# Patient Record
Sex: Male | Born: 1980 | Race: Black or African American | Hispanic: No | Marital: Single | Smoking: Never smoker
Health system: Southern US, Community
[De-identification: ages and names within clinical notes are randomized; demographics above are authoritative.]

## PROBLEM LIST (undated history)

## (undated) DIAGNOSIS — B853 Phthiriasis: Secondary | ICD-10-CM

## (undated) DIAGNOSIS — H9190 Unspecified hearing loss, unspecified ear: Secondary | ICD-10-CM

## (undated) DIAGNOSIS — R21 Rash and other nonspecific skin eruption: Secondary | ICD-10-CM

## (undated) DIAGNOSIS — R350 Frequency of micturition: Secondary | ICD-10-CM

## (undated) DIAGNOSIS — K6289 Other specified diseases of anus and rectum: Secondary | ICD-10-CM

## (undated) DIAGNOSIS — J40 Bronchitis, not specified as acute or chronic: Secondary | ICD-10-CM

## (undated) DIAGNOSIS — K760 Fatty (change of) liver, not elsewhere classified: Secondary | ICD-10-CM

## (undated) DIAGNOSIS — H1030 Unspecified acute conjunctivitis, unspecified eye: Secondary | ICD-10-CM

## (undated) DIAGNOSIS — B20 Human immunodeficiency virus [HIV] disease: Secondary | ICD-10-CM

## (undated) DIAGNOSIS — F329 Major depressive disorder, single episode, unspecified: Secondary | ICD-10-CM

## (undated) DIAGNOSIS — J189 Pneumonia, unspecified organism: Secondary | ICD-10-CM

## (undated) DIAGNOSIS — Z9189 Other specified personal risk factors, not elsewhere classified: Secondary | ICD-10-CM

## (undated) DIAGNOSIS — K219 Gastro-esophageal reflux disease without esophagitis: Secondary | ICD-10-CM

## (undated) DIAGNOSIS — E669 Obesity, unspecified: Secondary | ICD-10-CM

## (undated) DIAGNOSIS — A749 Chlamydial infection, unspecified: Secondary | ICD-10-CM

## (undated) DIAGNOSIS — E785 Hyperlipidemia, unspecified: Secondary | ICD-10-CM

## (undated) DIAGNOSIS — B04 Monkeypox: Secondary | ICD-10-CM

## (undated) DIAGNOSIS — K529 Noninfective gastroenteritis and colitis, unspecified: Secondary | ICD-10-CM

## (undated) DIAGNOSIS — J329 Chronic sinusitis, unspecified: Secondary | ICD-10-CM

## (undated) DIAGNOSIS — A539 Syphilis, unspecified: Secondary | ICD-10-CM

## (undated) DIAGNOSIS — R05 Cough: Secondary | ICD-10-CM

## (undated) DIAGNOSIS — F411 Generalized anxiety disorder: Secondary | ICD-10-CM

## (undated) DIAGNOSIS — R911 Solitary pulmonary nodule: Secondary | ICD-10-CM

## (undated) DIAGNOSIS — K089 Disorder of teeth and supporting structures, unspecified: Secondary | ICD-10-CM

## (undated) DIAGNOSIS — K51311 Ulcerative (chronic) rectosigmoiditis with rectal bleeding: Secondary | ICD-10-CM

## (undated) DIAGNOSIS — N529 Male erectile dysfunction, unspecified: Secondary | ICD-10-CM

## (undated) HISTORY — PX: OTHER SURGICAL HISTORY: SHX169

## (undated) HISTORY — DX: Other specified personal risk factors, not elsewhere classified: Z91.89

## (undated) HISTORY — DX: Major depressive disorder, single episode, unspecified: F32.9

## (undated) HISTORY — DX: Rash and other nonspecific skin eruption: R21

## (undated) HISTORY — DX: Ulcerative (chronic) rectosigmoiditis with rectal bleeding: K51.311

## (undated) HISTORY — PX: FLEXIBLE SIGMOIDOSCOPY: SHX1649

## (undated) HISTORY — DX: Cough: R05

## (undated) HISTORY — DX: Obesity, unspecified: E66.9

## (undated) HISTORY — DX: Male erectile dysfunction, unspecified: N52.9

## (undated) HISTORY — DX: Fatty (change of) liver, not elsewhere classified: K76.0

## (undated) HISTORY — PX: COLONOSCOPY: SHX174

## (undated) HISTORY — DX: Monkeypox: B04

## (undated) HISTORY — DX: Bronchitis, not specified as acute or chronic: J40

## (undated) HISTORY — DX: Hyperlipidemia, unspecified: E78.5

## (undated) HISTORY — DX: Other specified diseases of anus and rectum: K62.89

## (undated) HISTORY — DX: Chlamydial infection, unspecified: A74.9

## (undated) HISTORY — DX: Frequency of micturition: R35.0

## (undated) HISTORY — DX: Generalized anxiety disorder: F41.1

## (undated) HISTORY — DX: Unspecified acute conjunctivitis, unspecified eye: H10.30

## (undated) HISTORY — DX: Gastro-esophageal reflux disease without esophagitis: K21.9

## (undated) HISTORY — DX: Chronic sinusitis, unspecified: J32.9

## (undated) HISTORY — DX: Noninfective gastroenteritis and colitis, unspecified: K52.9

## (undated) HISTORY — PX: THROAT SURGERY: SHX803

## (undated) HISTORY — PX: UMBILICAL HERNIA REPAIR: SHX196

## (undated) HISTORY — DX: Unspecified hearing loss, unspecified ear: H91.90

## (undated) HISTORY — DX: Syphilis, unspecified: A53.9

## (undated) HISTORY — DX: Disorder of teeth and supporting structures, unspecified: K08.9

## (undated) HISTORY — DX: Solitary pulmonary nodule: R91.1

## (undated) HISTORY — DX: Phthiriasis: B85.3

## (undated) HISTORY — PX: ESOPHAGOGASTRODUODENOSCOPY: SHX1529

---

## 2003-03-02 ENCOUNTER — Encounter: Payer: Self-pay | Admitting: Internal Medicine

## 2003-03-02 ENCOUNTER — Encounter: Admission: RE | Admit: 2003-03-02 | Discharge: 2003-03-02 | Payer: Self-pay | Admitting: Internal Medicine

## 2003-03-02 ENCOUNTER — Encounter (INDEPENDENT_AMBULATORY_CARE_PROVIDER_SITE_OTHER): Payer: Self-pay | Admitting: *Deleted

## 2003-03-02 ENCOUNTER — Ambulatory Visit (HOSPITAL_COMMUNITY): Admission: RE | Admit: 2003-03-02 | Discharge: 2003-03-02 | Payer: Self-pay | Admitting: Internal Medicine

## 2003-03-17 ENCOUNTER — Encounter: Admission: RE | Admit: 2003-03-17 | Discharge: 2003-03-17 | Payer: Self-pay | Admitting: Internal Medicine

## 2003-03-18 ENCOUNTER — Inpatient Hospital Stay (HOSPITAL_COMMUNITY): Admission: EM | Admit: 2003-03-18 | Discharge: 2003-03-20 | Payer: Self-pay | Admitting: Psychiatry

## 2003-03-20 ENCOUNTER — Inpatient Hospital Stay (HOSPITAL_COMMUNITY): Admission: EM | Admit: 2003-03-20 | Discharge: 2003-03-23 | Payer: Self-pay | Admitting: Emergency Medicine

## 2003-04-28 ENCOUNTER — Encounter: Admission: RE | Admit: 2003-04-28 | Discharge: 2003-04-28 | Payer: Self-pay | Admitting: Internal Medicine

## 2003-05-26 ENCOUNTER — Encounter: Admission: RE | Admit: 2003-05-26 | Discharge: 2003-05-26 | Payer: Self-pay | Admitting: Internal Medicine

## 2003-06-11 ENCOUNTER — Emergency Department (HOSPITAL_COMMUNITY): Admission: EM | Admit: 2003-06-11 | Discharge: 2003-06-11 | Payer: Self-pay | Admitting: Emergency Medicine

## 2003-06-12 ENCOUNTER — Inpatient Hospital Stay (HOSPITAL_COMMUNITY): Admission: RE | Admit: 2003-06-12 | Discharge: 2003-06-16 | Payer: Self-pay | Admitting: Psychiatry

## 2003-07-30 ENCOUNTER — Ambulatory Visit (HOSPITAL_COMMUNITY): Admission: RE | Admit: 2003-07-30 | Discharge: 2003-07-30 | Payer: Self-pay | Admitting: Internal Medicine

## 2003-07-30 ENCOUNTER — Encounter: Admission: RE | Admit: 2003-07-30 | Discharge: 2003-07-30 | Payer: Self-pay | Admitting: Internal Medicine

## 2003-09-14 ENCOUNTER — Encounter: Admission: RE | Admit: 2003-09-14 | Discharge: 2003-09-14 | Payer: Self-pay | Admitting: Internal Medicine

## 2003-11-03 ENCOUNTER — Ambulatory Visit: Payer: Self-pay | Admitting: Internal Medicine

## 2004-03-16 ENCOUNTER — Ambulatory Visit: Payer: Self-pay | Admitting: Internal Medicine

## 2004-05-19 ENCOUNTER — Emergency Department (HOSPITAL_COMMUNITY): Admission: EM | Admit: 2004-05-19 | Discharge: 2004-05-19 | Payer: Self-pay | Admitting: Family Medicine

## 2004-06-21 ENCOUNTER — Ambulatory Visit: Payer: Self-pay | Admitting: Internal Medicine

## 2004-06-21 ENCOUNTER — Ambulatory Visit (HOSPITAL_COMMUNITY): Admission: RE | Admit: 2004-06-21 | Discharge: 2004-06-21 | Payer: Self-pay | Admitting: Internal Medicine

## 2004-09-01 ENCOUNTER — Ambulatory Visit: Payer: Self-pay | Admitting: Internal Medicine

## 2004-09-28 ENCOUNTER — Ambulatory Visit: Payer: Self-pay | Admitting: Internal Medicine

## 2005-04-25 ENCOUNTER — Ambulatory Visit: Payer: Self-pay | Admitting: Internal Medicine

## 2005-04-25 ENCOUNTER — Encounter (INDEPENDENT_AMBULATORY_CARE_PROVIDER_SITE_OTHER): Payer: Self-pay | Admitting: *Deleted

## 2006-01-09 ENCOUNTER — Encounter: Admission: RE | Admit: 2006-01-09 | Discharge: 2006-01-09 | Payer: Self-pay | Admitting: Internal Medicine

## 2006-01-09 ENCOUNTER — Ambulatory Visit: Payer: Self-pay | Admitting: Internal Medicine

## 2006-01-09 ENCOUNTER — Encounter (INDEPENDENT_AMBULATORY_CARE_PROVIDER_SITE_OTHER): Payer: Self-pay | Admitting: *Deleted

## 2006-01-09 LAB — CONVERTED CEMR LAB
CD4 Count: 640 microliters
HIV 1 RNA Quant: 78200 copies/mL

## 2006-01-11 ENCOUNTER — Ambulatory Visit: Payer: Self-pay | Admitting: Infectious Diseases

## 2006-01-11 ENCOUNTER — Inpatient Hospital Stay (HOSPITAL_COMMUNITY): Admission: EM | Admit: 2006-01-11 | Discharge: 2006-01-15 | Payer: Self-pay | Admitting: Emergency Medicine

## 2006-01-11 ENCOUNTER — Encounter (INDEPENDENT_AMBULATORY_CARE_PROVIDER_SITE_OTHER): Payer: Self-pay | Admitting: *Deleted

## 2006-01-23 ENCOUNTER — Ambulatory Visit: Payer: Self-pay | Admitting: Infectious Diseases

## 2006-01-23 LAB — CONVERTED CEMR LAB
ALT: 20 units/L (ref 0–53)
BUN: 8 mg/dL (ref 6–23)
CO2: 30 meq/L (ref 19–32)
Creatinine, Ser: 0.86 mg/dL (ref 0.40–1.50)
HCT: 45.8 % (ref 41.0–49.0)
MCV: 86.7 fL (ref 78.8–100.0)
Platelets: 329 10*3/uL (ref 152–374)
RDW: 13.3 % (ref 11.5–15.3)
Total Bilirubin: 0.7 mg/dL (ref 0.3–1.2)
WBC: 5.1 10*3/uL (ref 3.7–10.0)

## 2006-01-25 ENCOUNTER — Ambulatory Visit: Payer: Self-pay | Admitting: Infectious Diseases

## 2006-01-25 LAB — CONVERTED CEMR LAB
Ketones, ur: NEGATIVE mg/dL
Leukocytes, UA: NEGATIVE
Nitrite: NEGATIVE
RBC / HPF: NONE SEEN (ref <3)
Specific Gravity, Urine: 1.023 (ref 1.005–1.03)
pH: 6.5 (ref 5.0–8.0)

## 2006-03-02 DIAGNOSIS — F411 Generalized anxiety disorder: Secondary | ICD-10-CM

## 2006-03-02 DIAGNOSIS — J309 Allergic rhinitis, unspecified: Secondary | ICD-10-CM | POA: Insufficient documentation

## 2006-03-02 DIAGNOSIS — F329 Major depressive disorder, single episode, unspecified: Secondary | ICD-10-CM

## 2006-03-02 DIAGNOSIS — F3289 Other specified depressive episodes: Secondary | ICD-10-CM

## 2006-03-02 DIAGNOSIS — B20 Human immunodeficiency virus [HIV] disease: Secondary | ICD-10-CM

## 2006-03-02 HISTORY — DX: Other specified depressive episodes: F32.89

## 2006-03-02 HISTORY — DX: Major depressive disorder, single episode, unspecified: F32.9

## 2006-03-02 HISTORY — DX: Generalized anxiety disorder: F41.1

## 2006-03-26 ENCOUNTER — Encounter (INDEPENDENT_AMBULATORY_CARE_PROVIDER_SITE_OTHER): Payer: Self-pay | Admitting: *Deleted

## 2006-03-26 ENCOUNTER — Encounter: Admission: RE | Admit: 2006-03-26 | Discharge: 2006-03-26 | Payer: Self-pay | Admitting: Internal Medicine

## 2006-03-26 ENCOUNTER — Ambulatory Visit: Payer: Self-pay | Admitting: Internal Medicine

## 2006-03-26 DIAGNOSIS — H9190 Unspecified hearing loss, unspecified ear: Secondary | ICD-10-CM

## 2006-03-26 DIAGNOSIS — K6289 Other specified diseases of anus and rectum: Secondary | ICD-10-CM

## 2006-03-26 HISTORY — DX: Other specified diseases of anus and rectum: K62.89

## 2006-03-26 HISTORY — DX: Unspecified hearing loss, unspecified ear: H91.90

## 2006-03-26 LAB — CONVERTED CEMR LAB
ALT: 16 units/L (ref 0–53)
AST: 22 units/L (ref 0–37)
Albumin: 4.1 g/dL (ref 3.5–5.2)
Alkaline Phosphatase: 99 units/L (ref 39–117)
CD4 Count: 730 microliters
Calcium: 9.3 mg/dL (ref 8.4–10.5)
Chloride: 105 meq/L (ref 96–112)
HIV 1 RNA Quant: 40800 copies/mL
MCHC: 33.9 g/dL (ref 30.0–36.0)
Platelets: 250 10*3/uL (ref 150–400)
Potassium: 4 meq/L (ref 3.5–5.3)
RDW: 13.3 % (ref 11.5–14.0)
Sodium: 141 meq/L (ref 135–145)

## 2006-04-23 ENCOUNTER — Encounter (INDEPENDENT_AMBULATORY_CARE_PROVIDER_SITE_OTHER): Payer: Self-pay | Admitting: *Deleted

## 2006-04-23 LAB — CONVERTED CEMR LAB

## 2006-05-06 ENCOUNTER — Encounter (INDEPENDENT_AMBULATORY_CARE_PROVIDER_SITE_OTHER): Payer: Self-pay | Admitting: *Deleted

## 2006-06-20 DIAGNOSIS — Z9189 Other specified personal risk factors, not elsewhere classified: Secondary | ICD-10-CM

## 2006-06-20 DIAGNOSIS — B853 Phthiriasis: Secondary | ICD-10-CM

## 2006-06-20 HISTORY — DX: Phthiriasis: B85.3

## 2006-06-20 HISTORY — DX: Other specified personal risk factors, not elsewhere classified: Z91.89

## 2006-06-26 ENCOUNTER — Ambulatory Visit: Payer: Self-pay | Admitting: Internal Medicine

## 2006-06-26 ENCOUNTER — Encounter: Admission: RE | Admit: 2006-06-26 | Discharge: 2006-06-26 | Payer: Self-pay | Admitting: Internal Medicine

## 2006-06-26 LAB — CONVERTED CEMR LAB
CD4 Count: 750 microliters
HIV 1 RNA Quant: 38600 copies/mL — ABNORMAL HIGH (ref <50)
HIV-1 RNA Quant, Log: 4.59 — ABNORMAL HIGH (ref <1.70)

## 2006-08-06 ENCOUNTER — Telehealth: Payer: Self-pay

## 2006-09-25 ENCOUNTER — Telehealth: Payer: Self-pay | Admitting: Internal Medicine

## 2006-12-25 ENCOUNTER — Ambulatory Visit: Payer: Self-pay | Admitting: Internal Medicine

## 2006-12-25 ENCOUNTER — Encounter: Admission: RE | Admit: 2006-12-25 | Discharge: 2006-12-25 | Payer: Self-pay | Admitting: Internal Medicine

## 2006-12-25 LAB — CONVERTED CEMR LAB: HIV-1 RNA Quant, Log: 5.03 — ABNORMAL HIGH (ref <1.70)

## 2007-02-01 ENCOUNTER — Telehealth: Payer: Self-pay | Admitting: Internal Medicine

## 2007-02-26 ENCOUNTER — Encounter: Payer: Self-pay | Admitting: Internal Medicine

## 2007-05-15 ENCOUNTER — Encounter: Admission: RE | Admit: 2007-05-15 | Discharge: 2007-05-15 | Payer: Self-pay | Admitting: Internal Medicine

## 2007-05-15 ENCOUNTER — Ambulatory Visit: Payer: Self-pay | Admitting: Internal Medicine

## 2007-05-15 LAB — CONVERTED CEMR LAB
ALT: 20 units/L (ref 0–53)
AST: 35 units/L (ref 0–37)
Alkaline Phosphatase: 107 units/L (ref 39–117)
Basophils Absolute: 0.1 10*3/uL (ref 0.0–0.1)
CO2: 24 meq/L (ref 19–32)
Creatinine, Ser: 0.76 mg/dL (ref 0.40–1.50)
Eosinophils Absolute: 0.1 10*3/uL (ref 0.0–0.7)
Eosinophils Relative: 3 % (ref 0–5)
HCT: 44.8 % (ref 39.0–52.0)
Lymphs Abs: 2.9 10*3/uL (ref 0.7–4.0)
MCV: 88.4 fL (ref 78.0–100.0)
Neutrophils Relative %: 18 % — ABNORMAL LOW (ref 43–77)
Platelets: 216 10*3/uL (ref 150–400)
RDW: 12.9 % (ref 11.5–15.5)
Sodium: 137 meq/L (ref 135–145)
Total Bilirubin: 0.4 mg/dL (ref 0.3–1.2)
Total Protein: 8.6 g/dL — ABNORMAL HIGH (ref 6.0–8.3)
WBC: 4.7 10*3/uL (ref 4.0–10.5)

## 2007-05-27 ENCOUNTER — Ambulatory Visit: Payer: Self-pay | Admitting: Internal Medicine

## 2007-06-17 ENCOUNTER — Emergency Department (HOSPITAL_COMMUNITY): Admission: EM | Admit: 2007-06-17 | Discharge: 2007-06-17 | Payer: Self-pay | Admitting: Emergency Medicine

## 2007-10-24 ENCOUNTER — Ambulatory Visit: Payer: Self-pay | Admitting: Internal Medicine

## 2007-10-24 LAB — CONVERTED CEMR LAB
HIV 1 RNA Quant: 211000 copies/mL — ABNORMAL HIGH (ref <50)
HIV-1 RNA Quant, Log: 5.32 — ABNORMAL HIGH (ref <1.70)

## 2007-11-05 ENCOUNTER — Ambulatory Visit: Payer: Self-pay | Admitting: Internal Medicine

## 2008-05-19 ENCOUNTER — Telehealth: Payer: Self-pay | Admitting: Internal Medicine

## 2008-06-24 ENCOUNTER — Emergency Department (HOSPITAL_COMMUNITY): Admission: EM | Admit: 2008-06-24 | Discharge: 2008-06-24 | Payer: Self-pay | Admitting: Emergency Medicine

## 2008-06-25 ENCOUNTER — Telehealth (INDEPENDENT_AMBULATORY_CARE_PROVIDER_SITE_OTHER): Payer: Self-pay | Admitting: *Deleted

## 2008-06-26 ENCOUNTER — Ambulatory Visit: Payer: Self-pay | Admitting: Internal Medicine

## 2008-06-30 ENCOUNTER — Telehealth: Payer: Self-pay | Admitting: Internal Medicine

## 2008-07-01 ENCOUNTER — Encounter: Payer: Self-pay | Admitting: Internal Medicine

## 2008-07-01 ENCOUNTER — Telehealth (INDEPENDENT_AMBULATORY_CARE_PROVIDER_SITE_OTHER): Payer: Self-pay | Admitting: Licensed Clinical Social Worker

## 2008-07-03 ENCOUNTER — Telehealth (INDEPENDENT_AMBULATORY_CARE_PROVIDER_SITE_OTHER): Payer: Self-pay | Admitting: *Deleted

## 2008-07-07 ENCOUNTER — Ambulatory Visit: Payer: Self-pay | Admitting: Internal Medicine

## 2008-07-07 LAB — CONVERTED CEMR LAB
ALT: 28 units/L (ref 0–53)
AST: 49 units/L — ABNORMAL HIGH (ref 0–37)
Basophils Absolute: 0.1 10*3/uL (ref 0.0–0.1)
Basophils Relative: 2 % — ABNORMAL HIGH (ref 0–1)
CO2: 25 meq/L (ref 19–32)
Calcium: 8.7 mg/dL (ref 8.4–10.5)
Chloride: 104 meq/L (ref 96–112)
Creatinine, Ser: 0.84 mg/dL (ref 0.40–1.50)
Eosinophils Absolute: 0.1 10*3/uL (ref 0.0–0.7)
GFR calc Af Amer: >60 mL/min (ref >60)
MCHC: 34.4 g/dL (ref 30.0–36.0)
MCV: 85.5 fL (ref 78.0–100.0)
Monocytes Absolute: 0.7 10*3/uL (ref 0.1–1.0)
Monocytes Relative: 14 % — ABNORMAL HIGH (ref 3–12)
Neutrophils Relative %: 23 % — ABNORMAL LOW (ref 43–77)
Potassium: 3.6 meq/L (ref 3.5–5.3)
RBC: 4.69 M/uL (ref 4.22–5.81)
RDW: 13.1 % (ref 11.5–15.5)
Sodium: 139 meq/L (ref 135–145)
Total Protein: 8.1 g/dL (ref 6.0–8.3)

## 2008-07-14 ENCOUNTER — Ambulatory Visit: Payer: Self-pay | Admitting: Internal Medicine

## 2008-07-17 ENCOUNTER — Telehealth: Payer: Self-pay | Admitting: Internal Medicine

## 2008-10-01 ENCOUNTER — Telehealth (INDEPENDENT_AMBULATORY_CARE_PROVIDER_SITE_OTHER): Payer: Self-pay | Admitting: *Deleted

## 2008-10-07 ENCOUNTER — Ambulatory Visit: Payer: Self-pay | Admitting: Internal Medicine

## 2008-10-07 ENCOUNTER — Telehealth: Payer: Self-pay | Admitting: Internal Medicine

## 2008-10-07 LAB — CONVERTED CEMR LAB: Helicobacter Pylori Antibody-IgG: 0.5

## 2008-10-08 ENCOUNTER — Encounter: Payer: Self-pay | Admitting: Internal Medicine

## 2008-10-08 ENCOUNTER — Ambulatory Visit: Payer: Self-pay | Admitting: Gastroenterology

## 2008-10-08 LAB — CONVERTED CEMR LAB
Basophils Relative: 1.1 % (ref 0.0–3.0)
CO2: 31 meq/L (ref 19–32)
Chloride: 102 meq/L (ref 96–112)
Eosinophils Absolute: 0.1 10*3/uL (ref 0.0–0.7)
Glucose, Bld: 88 mg/dL (ref 70–99)
Hemoglobin: 15 g/dL (ref 13.0–17.0)
Lymphs Abs: 2.6 10*3/uL (ref 0.7–4.0)
MCHC: 33 g/dL (ref 30.0–36.0)
MCV: 91.8 fL (ref 78.0–100.0)
Monocytes Absolute: 0.8 10*3/uL (ref 0.1–1.0)
Neutro Abs: 1.6 10*3/uL (ref 1.4–7.7)
RBC: 4.93 M/uL (ref 4.22–5.81)
Sodium: 139 meq/L (ref 135–145)

## 2008-10-10 ENCOUNTER — Encounter: Payer: Self-pay | Admitting: Physician Assistant

## 2008-10-10 ENCOUNTER — Encounter: Payer: Self-pay | Admitting: Internal Medicine

## 2008-10-13 ENCOUNTER — Telehealth (INDEPENDENT_AMBULATORY_CARE_PROVIDER_SITE_OTHER): Payer: Self-pay | Admitting: *Deleted

## 2008-10-21 ENCOUNTER — Telehealth (INDEPENDENT_AMBULATORY_CARE_PROVIDER_SITE_OTHER): Payer: Self-pay | Admitting: *Deleted

## 2008-10-21 ENCOUNTER — Encounter: Payer: Self-pay | Admitting: Internal Medicine

## 2008-10-21 ENCOUNTER — Ambulatory Visit: Payer: Self-pay | Admitting: Infectious Diseases

## 2008-10-21 ENCOUNTER — Telehealth: Payer: Self-pay | Admitting: Physician Assistant

## 2008-10-21 ENCOUNTER — Ambulatory Visit (HOSPITAL_COMMUNITY): Admission: RE | Admit: 2008-10-21 | Discharge: 2008-10-21 | Payer: Self-pay | Admitting: Infectious Diseases

## 2008-10-21 LAB — CONVERTED CEMR LAB
Basophils Absolute: 0 10*3/uL (ref 0.0–0.1)
Eosinophils Absolute: 0.1 10*3/uL (ref 0.0–0.7)
Eosinophils Relative: 3 % (ref 0–5)
HCT: 44 % (ref 39.0–52.0)
Lymphs Abs: 3.1 10*3/uL (ref 0.7–4.0)
MCHC: 34.2 g/dL (ref 30.0–36.0)
MCV: 90.1 fL (ref >=78.0)
Platelets: 197 10*3/uL (ref 150–400)
RDW: 12.9 % (ref 11.5–15.5)

## 2008-10-23 ENCOUNTER — Telehealth: Payer: Self-pay | Admitting: Infectious Diseases

## 2008-10-26 ENCOUNTER — Telehealth (INDEPENDENT_AMBULATORY_CARE_PROVIDER_SITE_OTHER): Payer: Self-pay | Admitting: Licensed Clinical Social Worker

## 2008-10-27 ENCOUNTER — Telehealth (INDEPENDENT_AMBULATORY_CARE_PROVIDER_SITE_OTHER): Payer: Self-pay | Admitting: *Deleted

## 2008-10-28 ENCOUNTER — Telehealth (INDEPENDENT_AMBULATORY_CARE_PROVIDER_SITE_OTHER): Payer: Self-pay | Admitting: *Deleted

## 2008-10-30 ENCOUNTER — Telehealth (INDEPENDENT_AMBULATORY_CARE_PROVIDER_SITE_OTHER): Payer: Self-pay | Admitting: *Deleted

## 2008-11-06 ENCOUNTER — Emergency Department (HOSPITAL_COMMUNITY): Admission: EM | Admit: 2008-11-06 | Discharge: 2008-11-06 | Payer: Self-pay | Admitting: Emergency Medicine

## 2008-11-06 ENCOUNTER — Telehealth (INDEPENDENT_AMBULATORY_CARE_PROVIDER_SITE_OTHER): Payer: Self-pay | Admitting: *Deleted

## 2008-11-09 ENCOUNTER — Telehealth (INDEPENDENT_AMBULATORY_CARE_PROVIDER_SITE_OTHER): Payer: Self-pay | Admitting: *Deleted

## 2008-11-11 ENCOUNTER — Emergency Department (HOSPITAL_COMMUNITY): Admission: EM | Admit: 2008-11-11 | Discharge: 2008-11-12 | Payer: Self-pay | Admitting: Emergency Medicine

## 2008-11-12 ENCOUNTER — Telehealth (INDEPENDENT_AMBULATORY_CARE_PROVIDER_SITE_OTHER): Payer: Self-pay | Admitting: *Deleted

## 2008-11-13 ENCOUNTER — Ambulatory Visit: Payer: Self-pay | Admitting: Internal Medicine

## 2008-11-13 LAB — CONVERTED CEMR LAB
Glucose, Urine, Semiquant: NEGATIVE
Nitrite: NEGATIVE
Protein, U semiquant: 30
Specific Gravity, Urine: >=1.03

## 2008-11-17 ENCOUNTER — Ambulatory Visit: Payer: Self-pay | Admitting: Internal Medicine

## 2008-11-17 DIAGNOSIS — R21 Rash and other nonspecific skin eruption: Secondary | ICD-10-CM

## 2008-11-17 DIAGNOSIS — N529 Male erectile dysfunction, unspecified: Secondary | ICD-10-CM

## 2008-11-17 HISTORY — DX: Male erectile dysfunction, unspecified: N52.9

## 2008-11-17 HISTORY — DX: Rash and other nonspecific skin eruption: R21

## 2008-11-17 LAB — CONVERTED CEMR LAB: Testosterone: 360.15 ng/dL (ref 350–890)

## 2008-11-30 ENCOUNTER — Telehealth: Payer: Self-pay

## 2008-12-18 ENCOUNTER — Telehealth (INDEPENDENT_AMBULATORY_CARE_PROVIDER_SITE_OTHER): Payer: Self-pay | Admitting: *Deleted

## 2008-12-23 ENCOUNTER — Ambulatory Visit: Payer: Self-pay | Admitting: Internal Medicine

## 2008-12-23 DIAGNOSIS — R059 Cough, unspecified: Secondary | ICD-10-CM

## 2008-12-23 DIAGNOSIS — R05 Cough: Secondary | ICD-10-CM

## 2008-12-23 DIAGNOSIS — R052 Subacute cough: Secondary | ICD-10-CM | POA: Insufficient documentation

## 2008-12-23 HISTORY — DX: Cough, unspecified: R05.9

## 2008-12-25 ENCOUNTER — Telehealth (INDEPENDENT_AMBULATORY_CARE_PROVIDER_SITE_OTHER): Payer: Self-pay | Admitting: *Deleted

## 2009-02-16 ENCOUNTER — Telehealth: Payer: Self-pay

## 2009-02-18 ENCOUNTER — Ambulatory Visit: Payer: Self-pay | Admitting: Infectious Disease

## 2009-02-18 DIAGNOSIS — J329 Chronic sinusitis, unspecified: Secondary | ICD-10-CM | POA: Insufficient documentation

## 2009-02-18 HISTORY — DX: Chronic sinusitis, unspecified: J32.9

## 2009-03-16 ENCOUNTER — Telehealth (INDEPENDENT_AMBULATORY_CARE_PROVIDER_SITE_OTHER): Payer: Self-pay | Admitting: *Deleted

## 2009-03-23 ENCOUNTER — Ambulatory Visit: Payer: Self-pay | Admitting: Internal Medicine

## 2009-03-23 LAB — CONVERTED CEMR LAB
Albumin: 4.2 g/dL (ref 3.5–5.2)
BUN: 14 mg/dL (ref 6–23)
CO2: 27 meq/L (ref 19–32)
Calcium: 9.4 mg/dL (ref 8.4–10.5)
Chloride: 100 meq/L (ref 96–112)
Eosinophils Relative: 3 % (ref 0–5)
Glucose, Bld: 78 mg/dL (ref 70–99)
HCT: 43.7 % (ref 39.0–52.0)
HDL: 29 mg/dL — ABNORMAL LOW (ref >39)
HIV-1 RNA Quant, Log: 5.48 — ABNORMAL HIGH (ref <1.68)
Hemoglobin: 15 g/dL (ref 13.0–17.0)
Lymphocytes Relative: 58 % — ABNORMAL HIGH (ref 12–46)
Lymphs Abs: 2.6 10*3/uL (ref 0.7–4.0)
Monocytes Absolute: 0.6 10*3/uL (ref 0.1–1.0)
Monocytes Relative: 13 % — ABNORMAL HIGH (ref 3–12)
Potassium: 3.9 meq/L (ref 3.5–5.3)
RDW: 12.6 % (ref 11.5–15.5)
Triglycerides: 80 mg/dL (ref <150)
WBC: 4.6 10*3/uL (ref 4.0–10.5)

## 2009-04-06 ENCOUNTER — Ambulatory Visit: Payer: Self-pay | Admitting: Internal Medicine

## 2009-04-06 DIAGNOSIS — R350 Frequency of micturition: Secondary | ICD-10-CM

## 2009-04-06 HISTORY — DX: Frequency of micturition: R35.0

## 2009-04-06 LAB — CONVERTED CEMR LAB
Chlamydia, Swab/Urine, PCR: NEGATIVE
GC Probe Amp, Urine: NEGATIVE

## 2009-04-26 ENCOUNTER — Telehealth (INDEPENDENT_AMBULATORY_CARE_PROVIDER_SITE_OTHER): Payer: Self-pay | Admitting: *Deleted

## 2009-04-29 ENCOUNTER — Telehealth: Payer: Self-pay | Admitting: Internal Medicine

## 2009-05-24 ENCOUNTER — Telehealth (INDEPENDENT_AMBULATORY_CARE_PROVIDER_SITE_OTHER): Payer: Self-pay | Admitting: *Deleted

## 2009-07-05 ENCOUNTER — Ambulatory Visit: Payer: Self-pay | Admitting: Internal Medicine

## 2009-07-05 LAB — CONVERTED CEMR LAB
HIV 1 RNA Quant: 1410000 copies/mL — ABNORMAL HIGH (ref <48)
HIV-1 RNA Quant, Log: 6.15 — ABNORMAL HIGH (ref <1.68)

## 2009-07-06 ENCOUNTER — Encounter: Payer: Self-pay | Admitting: Internal Medicine

## 2009-07-20 ENCOUNTER — Ambulatory Visit: Payer: Self-pay | Admitting: Internal Medicine

## 2009-07-20 DIAGNOSIS — K219 Gastro-esophageal reflux disease without esophagitis: Secondary | ICD-10-CM

## 2009-07-20 HISTORY — DX: Gastro-esophageal reflux disease without esophagitis: K21.9

## 2009-07-27 ENCOUNTER — Telehealth (INDEPENDENT_AMBULATORY_CARE_PROVIDER_SITE_OTHER): Payer: Self-pay | Admitting: *Deleted

## 2009-07-27 ENCOUNTER — Ambulatory Visit: Payer: Self-pay | Admitting: Internal Medicine

## 2009-07-27 DIAGNOSIS — R197 Diarrhea, unspecified: Secondary | ICD-10-CM

## 2009-07-28 ENCOUNTER — Encounter (INDEPENDENT_AMBULATORY_CARE_PROVIDER_SITE_OTHER): Payer: Self-pay | Admitting: *Deleted

## 2009-07-28 ENCOUNTER — Telehealth: Payer: Self-pay | Admitting: Internal Medicine

## 2009-07-29 ENCOUNTER — Telehealth: Payer: Self-pay | Admitting: Internal Medicine

## 2009-08-03 ENCOUNTER — Telehealth (INDEPENDENT_AMBULATORY_CARE_PROVIDER_SITE_OTHER): Payer: Self-pay | Admitting: *Deleted

## 2009-08-05 ENCOUNTER — Telehealth (INDEPENDENT_AMBULATORY_CARE_PROVIDER_SITE_OTHER): Payer: Self-pay | Admitting: *Deleted

## 2009-09-02 ENCOUNTER — Telehealth: Payer: Self-pay | Admitting: Internal Medicine

## 2009-09-22 ENCOUNTER — Telehealth (INDEPENDENT_AMBULATORY_CARE_PROVIDER_SITE_OTHER): Payer: Self-pay | Admitting: *Deleted

## 2009-09-28 ENCOUNTER — Telehealth (INDEPENDENT_AMBULATORY_CARE_PROVIDER_SITE_OTHER): Payer: Self-pay | Admitting: *Deleted

## 2009-10-15 ENCOUNTER — Telehealth (INDEPENDENT_AMBULATORY_CARE_PROVIDER_SITE_OTHER): Payer: Self-pay | Admitting: *Deleted

## 2009-10-28 ENCOUNTER — Telehealth (INDEPENDENT_AMBULATORY_CARE_PROVIDER_SITE_OTHER): Payer: Self-pay | Admitting: *Deleted

## 2009-11-02 ENCOUNTER — Ambulatory Visit: Payer: Self-pay | Admitting: Internal Medicine

## 2009-11-04 ENCOUNTER — Telehealth (INDEPENDENT_AMBULATORY_CARE_PROVIDER_SITE_OTHER): Payer: Self-pay | Admitting: *Deleted

## 2009-11-15 ENCOUNTER — Ambulatory Visit: Payer: Self-pay | Admitting: Internal Medicine

## 2009-11-15 LAB — CONVERTED CEMR LAB
ALT: 21 units/L (ref 0–53)
AST: 29 units/L (ref 0–37)
Alkaline Phosphatase: 101 units/L (ref 39–117)
Basophils Absolute: 0 10*3/uL (ref 0.0–0.1)
Basophils Relative: 0 % (ref 0–1)
CO2: 28 meq/L (ref 19–32)
Eosinophils Relative: 2 % (ref 0–5)
HCT: 45.6 % (ref 39.0–52.0)
HIV 1 RNA Quant: 584000 copies/mL — ABNORMAL HIGH (ref <20)
Lymphocytes Relative: 56 % — ABNORMAL HIGH (ref 12–46)
MCHC: 34.4 g/dL (ref 30.0–36.0)
Neutro Abs: 1.2 10*3/uL — ABNORMAL LOW (ref 1.7–7.7)
Platelets: 211 10*3/uL (ref 150–400)
RDW: 13.5 % (ref 11.5–15.5)
Sodium: 141 meq/L (ref 135–145)
Total Bilirubin: 0.5 mg/dL (ref 0.3–1.2)
Total Protein: 8.3 g/dL (ref 6.0–8.3)

## 2009-11-22 ENCOUNTER — Telehealth (INDEPENDENT_AMBULATORY_CARE_PROVIDER_SITE_OTHER): Payer: Self-pay | Admitting: *Deleted

## 2009-11-26 ENCOUNTER — Telehealth (INDEPENDENT_AMBULATORY_CARE_PROVIDER_SITE_OTHER): Payer: Self-pay | Admitting: *Deleted

## 2009-11-26 ENCOUNTER — Encounter (INDEPENDENT_AMBULATORY_CARE_PROVIDER_SITE_OTHER): Payer: Self-pay | Admitting: *Deleted

## 2009-12-09 ENCOUNTER — Encounter: Payer: Self-pay | Admitting: Internal Medicine

## 2009-12-13 ENCOUNTER — Telehealth (INDEPENDENT_AMBULATORY_CARE_PROVIDER_SITE_OTHER): Payer: Self-pay | Admitting: *Deleted

## 2009-12-14 ENCOUNTER — Encounter: Payer: Self-pay | Admitting: Internal Medicine

## 2009-12-17 ENCOUNTER — Encounter (INDEPENDENT_AMBULATORY_CARE_PROVIDER_SITE_OTHER): Payer: Self-pay | Admitting: *Deleted

## 2009-12-30 ENCOUNTER — Ambulatory Visit: Payer: Self-pay | Admitting: Internal Medicine

## 2009-12-30 LAB — CONVERTED CEMR LAB
Albumin: 4.5 g/dL (ref 3.5–5.2)
BUN: 11 mg/dL (ref 6–23)
CO2: 26 meq/L (ref 19–32)
Calcium: 9 mg/dL (ref 8.4–10.5)
Chloride: 99 meq/L (ref 96–112)
Creatinine, Ser: 1.06 mg/dL (ref 0.40–1.50)
Glucose, Bld: 103 mg/dL — ABNORMAL HIGH (ref 70–99)
HIV 1 RNA Quant: 226 copies/mL — ABNORMAL HIGH (ref <20)
HIV-1 RNA Quant, Log: 2.35 — ABNORMAL HIGH (ref <1.30)
Potassium: 4 meq/L (ref 3.5–5.3)

## 2010-01-05 ENCOUNTER — Encounter: Payer: Self-pay | Admitting: Internal Medicine

## 2010-01-11 ENCOUNTER — Telehealth (INDEPENDENT_AMBULATORY_CARE_PROVIDER_SITE_OTHER): Payer: Self-pay | Admitting: *Deleted

## 2010-01-12 ENCOUNTER — Telehealth (INDEPENDENT_AMBULATORY_CARE_PROVIDER_SITE_OTHER): Payer: Self-pay | Admitting: *Deleted

## 2010-01-25 ENCOUNTER — Encounter (INDEPENDENT_AMBULATORY_CARE_PROVIDER_SITE_OTHER): Payer: Self-pay | Admitting: *Deleted

## 2010-02-08 ENCOUNTER — Ambulatory Visit: Payer: Self-pay | Admitting: Internal Medicine

## 2010-02-09 ENCOUNTER — Encounter: Payer: Self-pay | Admitting: Internal Medicine

## 2010-03-22 ENCOUNTER — Encounter: Payer: Self-pay | Admitting: Internal Medicine

## 2010-03-28 ENCOUNTER — Telehealth: Payer: Self-pay | Admitting: Internal Medicine

## 2010-03-31 NOTE — Assessment & Plan Note (Signed)
Summary: 6wk f/u [mkj]   Referring Provider:  Michel Bickers Primary Provider:  Michel Bickers MD  CC:  follow-up visit and diarrhea down to once a month.  Still has stomach pain occas. .  History of Present Illness: Matthew Fitzpatrick is in for his routine visit.  He has all of his medications and appears to be taking them correctly.  He denies missing any doses.  He is struggling with medical bills.  Preventive Screening-Counseling & Management  Alcohol-Tobacco     Alcohol drinks/day: 0     Smoking Status: never  Caffeine-Diet-Exercise     Caffeine use/day: sodas     Does Patient Exercise: no      Type of exercise: walking and running     Exercise (avg: min/session): <30     Times/week: <3  Hep-HIV-STD-Contraception     HIV Risk: no risk noted     HIV Risk Counseling: not indicated-no HIV risk noted  Safety-Violence-Falls     Seat Belt Use: yes  Comments: given condoms      Sexual History:  n/a.        Drug Use:  never.     Prior Medication List:  TRUVADA 200-300 MG TABS (EMTRICITABINE-TENOFOVIR) Take 1 tablet by mouth once a day REYATAZ 300 MG CAPS (ATAZANAVIR SULFATE) Take 1 capsule by mouth once a day NORVIR 100 MG TABS (RITONAVIR) Take 1 tablet by mouth once a day SEPTRA DS 800-160 MG TABS (SULFAMETHOXAZOLE-TRIMETHOPRIM) Take 1 tablet by mouth once a day DIFLUCAN 100 MG TABS (FLUCONAZOLE) Take 1 tablet by mouth once a week ZITHROMAX 600 MG TABS (AZITHROMYCIN) Take 2 tablets by mouth ONCE a week ON TUESDAYs   Current Allergies (reviewed today): No known allergies  Vital Signs:  Patient profile:   30 year old male Height:      69 inches (175.26 cm) Weight:      193.5 pounds (87.95 kg) BMI:     28.68 Temp:     97.9 degrees F (36.61 degrees C) oral Pulse rate:   67 / minute BP sitting:   127 / 81  (left arm) Cuff size:   large  Vitals Entered By: Lorne Skeens RN (February 08, 2010 9:41 AM) CC: follow-up visit, diarrhea down to once a month.  Still has stomach pain  occas.  Is Patient Diabetic? No Pain Assessment Patient in pain? no      Nutritional Status BMI of 25 - 29 = overweight Nutritional Status Detail appetite "good"  Have you ever been in a relationship where you felt threatened, hurt or afraid?No   Does patient need assistance? Functional Status Self care Ambulation Normal Comments no missede doses   Physical Exam  General:  alert and well-nourished.   Mouth:  pharynx pink and moist, no erythema, and no exudates.  he had one tooth pulled recently. Lungs:  normal respiratory effort, no crackles, and no wheezes.   Heart:  normal rate, regular rhythm, no murmur, and no gallop.          Medication Adherence: 02/08/2010   Adherence to medications reviewed with patient. Counseling to provide adequate adherence provided                                Impression & Recommendations:  Problem # 1:  HIV DISEASE (ICD-042) His infection is coming under much better control.  I will continue his current regimen and repeat lab work in 3 months. His updated  medication list for this problem includes:    Septra Ds 800-160 Mg Tabs (Sulfamethoxazole-trimethoprim) .Marland Kitchen... Take 1 tablet by mouth once a day    Diflucan 100 Mg Tabs (Fluconazole) .Marland Kitchen... Take 1 tablet by mouth once a week    Zithromax 600 Mg Tabs (Azithromycin) .Marland Kitchen... 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 Patient Level III (61470) Future Orders: T-CD4SP ([facility]) (CD4SP) ... 05/09/2010 T-HIV Viral Load [id number]) ... 05/09/2010 T-Comprehensive Metabolic Panel ([id number]) ... 05/09/2010 T-CBC w/Diff ([id number]) ... 05/09/2010 T-RPR (Syphilis) [id number]) ... 05/09/2010 T-Lipid Profile [id number])  ... 05/09/2010  Patient Instructions: 1)  Please schedule a follow-up appointment in 3 months.         Medication Adherence: 02/08/2010   Adherence to medications reviewed with patient. Counseling to provide adequate adherence provided

## 2010-03-31 NOTE — Letter (Signed)
Summary: Connection To Care: Pt. Application  Connection To Care: Pt. Application   Imported By: Bonner Puna 12/15/2009 09:22:54  _____________________________________________________________________  External Attachment:    Type:   Image     Comment:   External Document

## 2010-03-31 NOTE — Progress Notes (Signed)
Summary: [employer] PAP for Diflucan and Zithromax mailed 12/14/09  Phone Note Outgoing Call   Call placed by: Lorne Skeens RN,  December 13, 2009 4:27 PM Call placed to: [employer] PAP program Action Taken: Phone Call Completed Summary of Call: PAP form mailed to [employer] for Diflucan and Zithromax which was signed by Dr. [name] today. Lorne Skeens RN  December 13, 2009 4:29 PM

## 2010-03-31 NOTE — Progress Notes (Signed)
Summary: "lungs hurt," side effects from rxes bothering him, OV 11/02/09  Phone Note Call from Patient Call back at Home Phone [phone] Call back at cell, [phone]   Caller: Patient Call For: Michel Bickers MD Reason for Call: Talk to Nurse Summary of Call: Pt. left message stating that his "lungs hurt."  Still having problems w/ S.E. from rxes.  Wondering what he should do.  RN retunrned call,  Message left regarding his visit on 11/02/09 @ 1015 w/ Dr. [name].  Advised going to local Urgent Care if his symptoms persist over the Labor Day weekend.  Lorne Skeens RN  October 28, 2009 10:26 AM

## 2010-03-31 NOTE — Progress Notes (Signed)
Summary: clarified appts  Phone Note Call from Patient Call back at Home Phone [phone]   Caller: Patient Reason for Call: Talk to Nurse Summary of Call: Question about an appt. with an ENT MD after his last clinic appt in Dec.  Spoke with pt. through video relay.  Pt. has f/u appt. with Dr. Jenell Milliner 03/18/2009.  RN confirmed with pt. about lab and Dr. [name] appts.  Pt. verbalized understanding.  Lorne Skeens RN  March 16, 2009 2:13 PM

## 2010-03-31 NOTE — Progress Notes (Signed)
Summary: Pt. has questions about financial issues, referred to [facility]  Phone Note Call from Patient Call back at Home Phone [phone]   Caller: Patient Reason for Call: Talk to Nurse Summary of Call: Pt. having difficulty with financial issues, rent etc.  Wanting a call back to talk about these issues.  RN will inform [employer] so that she can meet with pt.  Lorne Skeens RN  September 28, 2009 11:12 AM RN spoke with [employer]  The pt. has told her that he did not want her services.  She had previously told him to contact [facility] to discuss his financial needs.  RN obtained tel. # for [facility] in [place] to give to pt.  [phone])   Message left for pt. with the information about [facility] and their Tel. #Lorne Skeens RN  September 28, 2009 11:16 AM

## 2010-03-31 NOTE — Progress Notes (Signed)
Summary: refill/mld  Phone Note Call from Patient   Caller: Patient Summary of Call: Patient came in to dicuss his medications. There was one medication he was missing in which was the Septra DS.  I asked the the nurse if he is supposed to be taking that drug and she said yes.  I told patient that he is supposed to be taking that drug as well and wanted to know which pharmacy he would like it to be called in to.  He said [facility]. Initial call taken by: Canary Brim  BS,CPht II,MPH,  November 26, 2009 10:58 AM    Prescriptions: SEPTRA DS 800-160 MG TABS (SULFAMETHOXAZOLE-TRIMETHOPRIM) Take 1 tablet by mouth once a day  #30 x 10   Entered by:   Canary Brim  BS,CPht II,MPH   Authorized by:   Michel Bickers MD   Signed by:   Canary Brim  BS,CPht II,MPH on 11/26/2009   Method used:   Electronically to        [facility] # [phone]* (retail)       [address]       [place], [place]  [address]       Ph: [phone]       Fax: [phone]   RxID:   [id number]  Canary Brim  BS,CPht II,MPH  November 26, 2009 10:58 AM  Appended Document: refill/mld application was also completed for the drugs fluconazole and azithromycin

## 2010-03-31 NOTE — Progress Notes (Signed)
Summary: c/o dizziness/drowsiness in AM  Phone Note Call from Patient Call back at Home Phone [phone]   Caller: Patient Call For: nurse Reason for Call: Talk to Nurse Summary of Call: Pt. called using the video-relay system.  Pt. complaining of dizziness/drowsiness when he gets up in the morning.  He is taking his HIV rxes at night.  RN advised taking the rxes between 6-7 PM to lessen the symptoms in the morning.  Pt. verbalized understanding.  Lorne Skeens RN  August 03, 2009 8:47 AM

## 2010-03-31 NOTE — Progress Notes (Signed)
Summary: answered questions  Phone Note Call from Patient Call back at Home Phone [phone]   Caller: Patient Reason for Call: Acute Illness, Talk to Nurse Summary of Call: Message left via video relay operator.  Pt. c/o pain in chest/tightness.   RN returned the call.  Pt. stated that this started 05/20/2009.  He went to [employer] and purchased some Advil which helped the pain.  He is fine today.  He just wanted to let the clinic know about these symptoms.  He also had a question about a couple of bills that he recieved.  This RN was able to answer the pt's questions about the bills. Lorne Skeens RN  May 24, 2009 4:31 PM

## 2010-03-31 NOTE — Miscellaneous (Signed)
Summary: clinical update/ryan white NCADAP app completed  Clinical Lists Changes  Observations: Added new observation of AIDSDAP: Pending (07/28/2009 11:37) Added new observation of PAYOR: Medicare (07/28/2009 11:37) Added new observation of PCTFPL: 150.58  (07/28/2009 11:37) Added new observation of [place]: 67[address]  (07/28/2009 11:37) Added new observation of FINASSESSDT: 07/27/2009  (07/28/2009 11:37) Added new observation of LATINO/HISP: No  (07/28/2009 11:37) Added new observation of RW VITAL STA: Active  (07/28/2009 11:37)

## 2010-03-31 NOTE — Assessment & Plan Note (Signed)
Summary: 5MONTH F/U/VS   Referring Provider:  Michel Bickers Primary Provider:  Michel Bickers MD  CC:  STOMACH PAIN WHILE EATING and INFREQUENT STOOLS.  History of Present Illness: Matthew Fitzpatrick is in for his routine visit. He  has not been to the community clinic in [facility] yet to establish a primary care provider.  He has seen an ENT physician in [facility] for his sinus symptoms and is due back tomorrow.  He says that he is urinating frequently during the day and night.  He has no dysuria or urethral discharge.  He says that he drinks about 10 glasses of water daily.  He says that he also has intermittent stomach pains after eating.  He is currently not taking any medications.  He says that he is upset because he has been contacted on multiple occasions by a person from the health department who says he was exposed to syphilis.  He says that he's had 23 male partners in the last 6 months.  He says that he and his partner should use condoms consistently.  He says that all 15 were aware of his HIV infection.   Updated Prior Medication List: No Medications Current Allergies (reviewed today): No known allergies  Vital Signs:  Patient profile:   30 year old male Height:      69 inches (175.26 cm) Weight:      181 pounds (82.27 kg) BMI:     26.83 Temp:     97.5 degrees F (36.39 degrees C) oral Pulse rate:   58 / minute BP sitting:   120 / 80  (left arm)  Vitals Entered By: Jarrett Ables CMA (April 06, 2009 10:14 AM) CC: STOMACH PAIN WHILE EATING, INFREQUENT STOOLS Is Patient Diabetic? No Pain Assessment Patient in pain? no      Nutritional Status BMI of 25 - 29 = overweight Nutritional Status Detail NL  Does patient need assistance? Functional Status Self care Ambulation Normal   Physical Exam  General:  alert and well-nourished.   Mouth:  pharynx pink and moist, no erythema, and no exudates.   Lungs:  normal respiratory effort, no crackles, and no wheezes.   Heart:   normal rate, regular rhythm, no murmur, and no gallop.   Abdomen:  soft, non-tender, normal bowel sounds, and no masses.  no CVA tenderness.  No suprapubic tenderness. Skin:  mild seborrhea on his face. Psych:  normally interactive, good eye contact, not anxious appearing, and not depressed appearing.  upset when talking about the health department DSS worker.           Prevention For Positives: 04/06/2009   Safe sex practices discussed with patient. Condoms offered.                             Impression & Recommendations:  Problem # 1:  HIV DISEASE (ICD-042) Hhis CD4 count is now below 500.  He probably would benefit from anti-retroviral treatment and sexual partner certainly might as well.  I will get them back in 3 months after lab work and consider starting therapy.  Have had a long discussion with him today about limiting the number of partners. Diagnostics Reviewed:  HIV: HIV positive - not AIDS (11/05/2007)   CD4: 330 (03/24/2009)   WBC: 4.6 (03/23/2009)   Hgb: 15.0 (03/23/2009)   HCT: 43.7 (03/23/2009)   Platelets: 256 (03/23/2009) HIV-1 RNA: 303000 (03/23/2009)   HBSAg: NO (04/23/2006)  Orders: Est.  Patient Level IV (61607) T-Chlamydia & GC Probe, Genital ([id number]) T-Urinalysis Dipstick only (37106YI)  Problem # 2:  SINUSITIS, CHRONIC (ICD-473.9) He continues to have the area of complaints and I have suggested that he establish primary care at the community clinic in [facility] near his home so he can have someone that oversees all of his care. The following medications were removed from the medication list:    Flunisolide 29 Mcg/act Soln (Flunisolide) .Marland Kitchen... Take two sprays in each nostril twice daily  Orders: Est. Patient Level IV (94854)  Problem # 3:  URINARY FREQUENCY (ICD-788.41) His blood sugar is normal and he does not have diabetes.  I do not know the cause of his urinary frequency.  I will check a urinalysis today. Orders: Est. Patient Level IV  (62703) T-Urinalysis Dipstick only (50093GH)  Other Orders: Future Orders: T-CD4SP ([facility]) (CD4SP) ... 07/05/2009 T-HIV Viral Load [phone]) ... 07/05/2009  Patient Instructions: 1)  Please schedule a follow-up appointment in 3 months.   Appended Document: Orders Update    Clinical Lists Changes  Orders: Added new Test order of T-Chlamydia  Probe, urine [phone]) - Signed Added new Test order of T-GC Probe, urine [phone]) - Signed      Process Orders Check Orders Results:     [facility]: Check successful Tests Sent for requisitioning (April 06, 2009 12:08 PM):     04/06/2009: [facility] -- T-Chlamydia  Probe, urine [id number] (signed)     04/06/2009: [facility] -- T-GC Probe, urine [phone] (signed)   Appended Document: 13MONTH F/U/VS     Laboratory Results   Urine Tests  Date/Time Recieved: 04/06/09 @ 1950 Date/Time Reported: 04/06/09 @ 1047  Routine Urinalysis   Appearance: Clear Glucose: negative   (Normal Range: Negative) Bilirubin: negative   (Normal Range: Negative) Ketone: negative   (Normal Range: Negative) Spec. Gravity: 1.010   (Normal Range: 1.003-1.035) Blood: negative   (Normal Range: Negative) pH: 5.5   (Normal Range: 5.0-8.0) Protein: negative   (Normal Range: Negative) Urobilinogen: negative   (Normal Range: 0-1) Nitrite: negative   (Normal Range: Negative) Leukocyte Esterace: negative   (Normal Range: Negative)

## 2010-03-31 NOTE — Progress Notes (Signed)
Summary: Wanting to see a dentist  Phone Note Call from Patient Call back at [phone]   Caller: Patient Reason for Call: Talk to Nurse Summary of Call: Wanting to know how he can obtain dental care.  He does not have a current urgent dental problem.  RN offered to give him the information to contact [place].  He stated that it was too far away.  RN suggested that he contact his [facility] CM in [place] to discuss applying for Medicaid.  Matthew Fitzpatrick said he would contact [facility]. Lorne Skeens RN  January 12, 2010 9:41 AM

## 2010-03-31 NOTE — Assessment & Plan Note (Signed)
Summary: GI symptoms cont. /dde   Referring Provider:  Michel Bickers Primary Provider:  Michel Bickers MD  CC:  pt. c/o diarrhea x 6 days and hot flashes.  History of Present Illness: (interpreter utilized) Pt c/o 7 days of abdominal pain with watery green diarrhea.  No nausea or vomiting.  No fever.  His appetite is good.  His symptoms are similar to those he ahd when he was sent to [facility] several months ago. Stool cultures were all negative.  He was treated with flagyl which helped. He is also having trouble obtaining his HIV medications.  I will refer him to [facility] for assistance. He may need to get enrolled in [place].  Preventive Screening-Counseling & Management  Alcohol-Tobacco     Alcohol drinks/day: 0     Smoking Status: never  Caffeine-Diet-Exercise     Caffeine use/day: sodas     Does Patient Exercise: yes     Type of exercise: walking and running     Exercise (avg: min/session): <30     Times/week: <3  Hep-HIV-STD-Contraception     HIV Risk: no risk noted     HIV Risk Counseling: not indicated-no HIV risk noted  Safety-Violence-Falls     Seat Belt Use: yes      Sexual History:  n/a.        Drug Use:  never.        Blood Transfusions:  no.        Travel History:  none.     Updated Prior Medication List: ATRIPLA 600-200-300 MG TABS (EFAVIRENZ-EMTRICITAB-TENOFOVIR) Take 1 tablet by mouth once a day SEPTRA DS 800-160 MG TABS (SULFAMETHOXAZOLE-TRIMETHOPRIM) Take 1 tablet by mouth once a day DIFLUCAN 100 MG TABS (FLUCONAZOLE) Take 1 tablet by mouth once a week ZITHROMAX 600 MG TABS (AZITHROMYCIN) Take 2 tablets by mouth once a day ZANTAC 150 MG TABS (RANITIDINE HCL) Take 1 tablet by mouth once a day METRONIDAZOLE 500 MG TABS (METRONIDAZOLE) Take 1 tablet by mouth three times a day  Current Allergies (reviewed today): No known allergies  Past History:  Past Medical History: Last updated: 10/08/2008 Deafness HIV disease Depression Allergic  rhinitis Anxiety Sucide attempt(hospitalized 1/05) chickenpox pediculosis pubis  Review of Systems  The patient denies anorexia, fever, weight loss, melena, hematochezia, and severe indigestion/heartburn.    Vital Signs:  Patient profile:   30 year old male Height:      69 inches (175.26 cm) Weight:      181.8 pounds (82.64 kg) BMI:     26.94 Temp:     98.0 degrees F (36.67 degrees C) oral Pulse rate:   62 / minute BP sitting:   121 / 78  (left arm)  Vitals Entered By: Myrtis Hopping CMA ( [employer]) (Jul 27, 2009 3:00 PM) CC: pt. c/o diarrhea x 6 days and hot flashes Is Patient Diabetic? No Pain Assessment Patient in pain? yes     Location: stomach Intensity: 7 Type: aching Onset of pain  Constant Nutritional Status BMI of 25 - 29 = overweight  Does patient need assistance? Functional Status Self care Ambulation Normal Comments pt. has not filled any meds, will not be able to pick up until 07/30/09   Physical Exam  General:  alert, well-developed, well-nourished, and well-hydrated.   Head:  normocephalic and atraumatic.   Abdomen:  soft and normal bowel sounds.  slightly tender in left lower quadrant - no rebound tenderness or guarding   Impression & Recommendations:  Problem # 1:  DIARRHEA (ICD-787.91)  will treat with flagyl if symptoms do not resolve will re-send all stool studies bland diet Orders: Est. Patient Level III (00923)  Problem # 2:  HIV DISEASE (ICD-042) refer to [facility] for assitance with meds.  Medications Added to Medication List This Visit: 1)  Metronidazole 500 Mg Tabs (Metronidazole) .... Take 1 tablet by mouth three times a day Prescriptions: METRONIDAZOLE 500 MG TABS (METRONIDAZOLE) Take 1 tablet by mouth three times a day  #21 x 0   Entered and Authorized by:   Aldona Bar MD   Signed by:   Aldona Bar MD on 07/27/2009   Method used:   Print then Give to Patient   RxID:   [id number]

## 2010-03-31 NOTE — Miscellaneous (Signed)
Summary: Orders Update - labs  Clinical Lists Changes  Orders: Added new Test order of T-RPR (Syphilis) [phone]) - Signed

## 2010-03-31 NOTE — Progress Notes (Signed)
Summary: Returning patient' s call    Caller: Patient Summary of Call: Patient left message using the [place] relay phone.  I am calling him back becuase I did not quite understand all of his message.  He seems to be calling becuase he can't afford some medication at [facility]. Initial call taken by: Canary Brim  BS,CPht II,MPH,  November 04, 2009 11:56 AM Call placed by: Canary Brim  BS,CPht II,MPH,  November 04, 2009 11:56 AM Call placed to: Patient Summary of Call: Tried to contact patient back, but was unable to reach him.  I did leave a message for him to call me back. Initial call taken by: Canary Brim  BS,CPht II,MPH,  November 04, 2009 11:58 AM    Additional Follow-up for Phone Call Additional follow up Details #2::    Received another message from [facility].  This time when I called back I got in touch with him.  He was not able to tell me which drug he is having trouble affording.  I asked him when will he be able to come and he stated that he will be here on 9/19.  I told him we will discuss it then.  His new medications have already been submitted to [facility] AD[facility]. Follow-up by: Canary Brim  BS,CPht II,MPH,  November 09, 2009 9:44 AM   Appended Document: Returning patient' s call Called patient again after receiving another message related to his medications.  This time he was able to tell me which medications he is having trouble with.  They are Reyataz and Truvada.  I left a message using the TTY program and informed him that he should not be getting these medications at [facility].  I reminded him that he should be receiving medications through the free program [place] as in before.  The doctor had changed his medications and I forwarded the new prescriptions to [facility] as told in a timely manner.  I left him [facility] phone number if he wants to call/or need to call them to see when it will be shipped.  I also asked him to call me if he had any problems with Clotilde Dieter so I  can take care of it for him.  I also left him my phone number.  Appended Document: Returning patient' s call left a message for [facility] representative to call me about [facility].  I wanted to verify that they received the new prescriptions and discontinued the Atripla for this patient because patient has been calling about his new medications.  The new prescriptions were faxed to [facility] on 11/03/09 successfully.  I left my number for them to follow up with me on my message about these medications.  Appended Document: Returning patient' s call Patient has called and left another message for me to call him back.   I called and had to leave him another message.  I left on his vm that I am here to 5pm.  I called and had to leave a message this morning with [facility] concerning patient's delivery of medications.  I have yet to hear from them as well.  I will keep trying to do my best to get what the patient needs.

## 2010-03-31 NOTE — Progress Notes (Signed)
Summary: samples  Phone Note Call from Patient   Caller: Patient Summary of Call: Patient is coming in today for samples of all his medications today to help him out for thirty days. He is awaiting response from [facility]. Initial call taken by: Jarrett Ables CMA,  July 28, 2009 11:04 AM  Follow-up for Phone Call        Patient picked up the samples supply . Follow-up by: Canary Brim  BS,CPht II,MPH,  July 28, 2009 2:57 PM    Prescriptions: DIFLUCAN 100 MG TABS (FLUCONAZOLE) Take 1 tablet by mouth once a week  #4 x 0   Entered by:   Canary Brim  BS,CPht II,MPH   Authorized by:   Michel Bickers MD   Signed by:   Canary Brim  BS,CPht II,MPH on 07/28/2009   Method used:   Samples Given   RxID:   [id number] ZITHROMAX 600 MG TABS (AZITHROMYCIN) Take 2 tablets by mouth once a day  #8 x 0   Entered by:   Canary Brim  BS,CPht II,MPH   Authorized by:   Michel Bickers MD   Signed by:   Canary Brim  BS,CPht II,MPH on 07/28/2009   Method used:   Samples Given   RxID:   [id number] SEPTRA DS 800-160 MG TABS (SULFAMETHOXAZOLE-TRIMETHOPRIM) Take 1 tablet by mouth once a day  #30 x 0   Entered by:   Canary Brim  BS,CPht II,MPH   Authorized by:   Michel Bickers MD   Signed by:   Canary Brim  BS,CPht II,MPH on 07/28/2009   Method used:   Samples Given   RxID:   [id number] ATRIPLA 600-200-300 MG TABS (EFAVIRENZ-EMTRICITAB-TENOFOVIR) Take 1 tablet by mouth once a day  #30 x 0   Entered by:   Canary Brim  BS,CPht II,MPH   Authorized by:   Michel Bickers MD   Signed by:   Canary Brim  BS,CPht II,MPH on 07/28/2009   Method used:   Samples Given   RxID:   [id number]  Sample Given, Lot #: [id number] [id number] Date:10/27/2009 Patient has been instructed regarding the correct time, dose and frequency of taking this med, including desired effects and most common side effects.  Sample Given, Lot #:Fluconazole [id number]  Date:02/09/2010 Patient has been instructed regarding the correct time, dose and frequency of taking this med, including desired effects and most common side effects.  Sample Given, Lot #:Atripla [id number] Date:01 2012 Patient has been instructed regarding the correct time, dose and frequency of taking this med, including desired effects and most common side effects.  Sample Given, Lot #:Zithormax [id number] Expiration Date:June 2012 Patient has been instructed regarding the correct time, dose and frequency of taking this med, including desired effects and most common side effects.

## 2010-03-31 NOTE — Progress Notes (Signed)
Summary: unable to leave message w/ [employer] video relay for pt.  Phone Note Call from Patient Call back at [phone]   Caller: Patient Summary of Call: Pt. left message to call him at the number above.  RN returned call but pt. did not answer and no message could be left through [employer] video relay service. Lorne Skeens RN  January 11, 2010 4:54 PM

## 2010-03-31 NOTE — Progress Notes (Signed)
Summary: Wait list for [facility], Pt. advise dto call [facility]   Phone Note Call from Patient Call back at Home Phone [phone]   Caller: Patient Reason for Call: Talk to Nurse Summary of Call: Pt. left message. Pt. applied at the [facility] and was told there was a long waiting list.  He was upset and wondered what else he could do.  RN called patient, left message.  Advised pt. to call [facility] w/ [place].  Pt. given address and phone number of this office. Lorne Skeens RN  April 26, 2009 12:52 PM

## 2010-03-31 NOTE — Miscellaneous (Signed)
Summary: Bridge Counseling  Clinical Lists Changes  C has made several attempts to contact client after client denied Bridge Counseling services over 3 months ago.  A second [facility] referral was submitted 11/22/09, [facility] phone client but was unable to reach, left a message via Primary school teacher.  12/09/09-BC phone client at Lorne Skeens, [place] request, [facility] was unable to reach client, left a voice message via Primary school teacher.  Client phone Langley Gauss as [facility] was leaving Denise's office, BC spoke with client and scheduled an intake for 12/16/09 @ 2:00.  Client most likely will benefit from tx adherence than Bridge Counseling due to client's counts and client lack of understanding on how to take his medications.  12/14/09-BC's supervisor and [facility] feel as though client will benefit more with being connected to Cherre Blanc, RN with [facility] under tx adherence/medical case management due to clients current count levels and client's lack of understanding in how to take his medications.  BC's supervisor request that Suanne Marker be present for appt [facility] scheduled for client 10/20 @ 2:00.  BC will not enroll client under [place] program but will sit in visit with Suanne Marker and client.  [facility] phone Suanne Marker and Suanne Marker agreed to meet with client 10/20 @ 2:00.  Suanne Marker will get referral from client's physician.  BC with the assistance of medical receptionist Sabino Dick scheduled for an interpreter to be present during [place] with client.  12/16/09-BC, Cherre Blanc, client, and interpreter Britt Bolognese (with [facility] [phone], [address] [place], [place] [address]) met with client in the [place] conference room.  Client had a lot of questions about HIV/AIDS and medications.  Suanne Marker will enroll client under tx adherence/medical case management Tues, 01/01/10 @ 1:00.  Suanne Marker will handle all of client's medication issues as well as fill his pill box.

## 2010-03-31 NOTE — Progress Notes (Signed)
Summary: returning pt's call  Phone Note Outgoing Call   Call placed by: Canary Brim  BS,CPht II,MPH,  October 15, 2009 3:00 PM Call placed to: Patient Summary of Call: tried to return patient's phone call.  Had to leave a message for him to call us back. Initial call taken by: Canary Brim  BS,CPht II,MPH,  October 15, 2009 3:01 PM  Follow-up for Phone Call        tried to contact patient.  Had to leave another message for patient to call Verdis Frederickson at [phone]. Follow-up by: Canary Brim  BS,CPht II,MPH,  October 18, 2009 3:31 PM  Additional Follow-up for Phone Call Additional follow up Details #1::        Patient called asking for his fluconazole and if it can be sent to [place]. Additional Follow-up by: Canary Brim  BS,CPht II,MPH,  October 22, 2009 12:36 PM    Prescriptions: DIFLUCAN 100 MG TABS (FLUCONAZOLE) Take 1 tablet by mouth once a week  #4 x 3   Entered by:   Canary Brim  BS,CPht II,MPH   Authorized by:   Michel Bickers MD   Signed by:   Canary Brim  BS,CPht II,MPH on 10/22/2009   Method used:   Electronically to        [facility].* # [phone]* (retail)       [address]       [place], [place]  [address]       Ph: [phone]       Fax: [phone]   RxID:   [id number]  Canary Brim  BS,CPht II,MPH  October 22, 2009 12:36 PM

## 2010-03-31 NOTE — Letter (Signed)
Summary: Discharge [facility]  [facility]   [place], [place] [address]   Phone: [phone]  Fax: [phone]    Type of Procedure: [ ]  Cervical ESI  L  R  B   Levels________________   [ ]  Caudal ESI     [ ]  SI Joint  L  R  B   [ ]  Lumbar ESI  L  R  B  Levels_________________   [ ]  Sciatic  L  R  B   [ ]  Occipital  L  R  B [ ]  Suprascapular  L  R  B [ ]  Transforaminal  L  R  B  Cervical  Thoracic   Lumbar   Levels____________________________  [ ]  Facet Injection  L  R  B  Cervical  Thoracic  Lumbar   Levels____________________________    [ ]  Intercostal  L  R  B  Levels__________________    [ ]  TPI's  L  R  B __________________________  [ ]   L  R  B ________________________________ [ ]  TPI's  L  R  B __________________________  [ ]   L  R  B ________________________________ [ ]  Other________________________________________________________________________  1.   Rest at home today, no lifting over five (5) pounds, no excessive bending or twisting.  2.   You may experience a local tenderness at injection site for a few days.  3.   You may experience a temporary worsening of your previous symptoms (leg pain, etc.) or a small area of numbness on your legs.  Do NOT drive a vehicle until all new numbness, tingling and weakness is completely resolved.  [place] at [phone] immediately if there is any heavy bleeding, new weakness or numbness, uncontrollable pain, or difficulty breathing.  Observe the area for swelling, redness, warmth, and discoloration.  Notify [facility]'s provider if there are any of these signs of infection and/or temperature above 100 degrees.  If unable to contact [facility]'s provider, call or go to an Emergency Room.  6.   Dressing Instructions: Band-Aid may be removed tonight or tomorrow.  There may be a small tender area and bruise.  You may use ice pack or heat at LOW  SETTING.  7.   Diet: Normal diet.  Take fluids liberally.  8.   Medication: May take usual medications and Tylenol as needed.  Monitor post-procedure pain levels closely, especially activities that previously worsened your symptoms.  If you are DIABETIC, check your blood glucose levels and report any major changes to your primary care physician.  If you are on BLOOD THINNERS (Coumadin, Plavix, Ticlid, Aspirin, etc.) restart that medication the morning after your procedure.  Special Instructions:________________________________________________________________________  I [place] acknowledge receipt of the instructions indicated above.  I will arrange for follow up care as instructed above.  ____________________________________     _______________ Patient or Representative Signature       Date  ____________________________________     _______________________________ Responsible Party Signature         Date  ____________________________________     _______________________________ Clinical Staff Signature           Date  Appended Document: Discharge Instructions Please disregard the form.  Incorrectly printed.

## 2010-03-31 NOTE — Progress Notes (Signed)
Summary: [facility] approval recieved, medications shipped 12/09/2009  Phone Note Outgoing Call   Call placed by: Lorne Skeens RN,  December 13, 2009 12:19 PM Call placed to: Patient Summary of Call: RN using video relay left message that [facility] medications were mailed 12/09/2009 after [facility] approval.  I hoped the the medications have been recieved.  Ranell Patrick, Bridge Counselor will meet w/ pt. on Thurs., Oct. 20th to review with pt. how to take them.  RN apologized for confusion last week about receiving the medications. Lorne Skeens RN  December 13, 2009 12:23 PM

## 2010-03-31 NOTE — Progress Notes (Signed)
Summary: Message left for pt. to return call  Phone Note Call from Patient Call back at [phone]   Caller: Patient Reason for Call: Talk to Nurse Summary of Call: Message left for RN to call pt.   RN returned call.  No answer.  Message left to call Center back. Lorne Skeens RN  November 23, 2009 12:35 PM      Appended Document: Message left for pt. to return call Asked Bridge Counselor to contact the pt. about how he is taking his medications.

## 2010-03-31 NOTE — Assessment & Plan Note (Signed)
Summary: f/u /dde   Referring Provider:  Michel Bickers Primary Provider:  Michel Bickers MD  CC:  chest still hurting since last week, no cough, no nasal drainage, no fever, pain caused by exercising, did not take anything for the pain.  Stopped taking Atripla over a month ago because it caused him problem with sleeping.   Caused him to just be awake, and not dreams. .  History of Present Illness: [name] he is for his routine visit.  He says that he stopped taking Atripla about one month ago but calls it made it difficult for him to sleep.  Unfortunately he did not inform us of this problem.  He also ran out of his other medications and did not know that he could refill.  He has had two sexual partners since his last visit.  He says that he and his partners always use condoms.  Preventive Screening-Counseling & Management  Alcohol-Tobacco     Alcohol drinks/day: 0     Smoking Status: never  Caffeine-Diet-Exercise     Caffeine use/day: sodas     Does Patient Exercise: yes     Type of exercise: walking and running     Exercise (avg: min/session): <30     Times/week: <3  Hep-HIV-STD-Contraception     HIV Risk: no risk noted     HIV Risk Counseling: not indicated-no HIV risk noted  Safety-Violence-Falls     Seat Belt Use: yes  Comments: given condoms      Sexual History:  yes.        Drug Use:  never.     Current Allergies (reviewed today): No known allergies  Social History: Sexual History:  yes  Vital Signs:  Patient profile:   30 year old male Height:      69 inches (175.26 cm) Weight:      187.75 pounds (85.34 kg) BMI:     27.83 Temp:     98.4 degrees F (36.89 degrees C) Pulse rate:   67 / minute BP sitting:   134 / 84  (left arm) Cuff size:   regular  Vitals Entered By: Lorne Skeens RN (November 02, 2009 10:16 AM) CC: chest still hurting since last week, no cough, no nasal drainage, no fever, pain caused by exercising, did not take anything for the pain.   Stopped taking Atripla over a month ago because it caused him problem with sleeping.   Caused him to just be awake, not dreams.  Is Patient Diabetic? No Pain Assessment Patient in pain? yes     Location: chest, lungs Intensity: 4 Nutritional Status BMI of 25 - 29 = overweight Nutritional Status Detail appetite "fine"  Have you ever been in a relationship where you felt threatened, hurt or afraid?No   Does patient need assistance? Functional Status Self care Ambulation Normal   Physical Exam  General:  alert and well-nourished.   Mouth:  pharynx pink and moist, no erythema, and no exudates.   Lungs:  normal respiratory effort, no crackles, and no wheezes.   Heart:  normal rate, regular rhythm, no murmur, and no gallop.          Medication Adherence: 11/02/2009   Adherence to medications reviewed with patient. Counseling to provide adequate adherence provided   Prevention For Positives: 11/02/2009   Safe sex practices discussed with patient. Condoms offered.  Impression & Recommendations:  Problem # 1:  HIV DISEASE (ICD-042) I have reviewed his lab work from his previous visit with him again today and told him how crucial it is that he start and stay on anti-retroviral therapy.  I will change him to Truvada, Reyataz and Norvir today. I asked him to call us if he has any further problems tolerating his medications. His updated medication list for this problem includes:    Septra Ds 800-160 Mg Tabs (Sulfamethoxazole-trimethoprim) .Marland Kitchen... Take 1 tablet by mouth once a day    Diflucan 100 Mg Tabs (Fluconazole) .Marland Kitchen... Take 1 tablet by mouth once a week    Zithromax 600 Mg Tabs (Azithromycin) .Marland Kitchen... Take 2 tablets by mouth once a day  Diagnostics Reviewed:  HIV: CDC-defined AIDS (07/20/2009)   CD4: 90 (07/06/2009)   WBC: 4.6 (03/23/2009)   Hgb: 15.0 (03/23/2009)   HCT: 43.7 (03/23/2009)   Platelets: 256 (03/23/2009) HIV-1 RNA: 1410000 (07/05/2009)    HBSAg: NO (04/23/2006)  Medications Added to Medication List This Visit: 1)  Truvada 200-300 Mg Tabs (Emtricitabine-tenofovir) .... Take 1 tablet by mouth once a day 2)  Reyataz 300 Mg Caps (Atazanavir sulfate) .... Take 1 capsule by mouth once a day 3)  Norvir 100 Mg Tabs (Ritonavir) .... Take 1 tablet by mouth once a day  Other Orders: Influenza Vaccine MCR (06301) Est. Patient Level III (60109) Est. Patient Level III (32355) Future Orders: T-CD4SP ([facility]) (CD4SP) ... 12/14/2009 T-HIV Viral Load [phone]) ... 12/14/2009 T-Comprehensive Metabolic Panel ([id number]) ... 12/14/2009 T-CBC w/Diff ([id number]) ... 12/14/2009  Patient Instructions: 1)  Please schedule a follow-up appointment in 2 months.  Prescriptions: NORVIR 100 MG TABS (RITONAVIR) Take 1 tablet by mouth once a day  #30 x 11   Entered and Authorized by:   Michel Bickers MD   Signed by:   Michel Bickers MD on 11/02/2009   Method used:   Electronically to        [facility].* # [phone]* (retail)       [address], [place]  [address]       Ph: [phone]       Fax: [phone]   RxID:   [id number] REYATAZ 300 MG CAPS (ATAZANAVIR SULFATE) Take 1 capsule by mouth once a day  #30 x 11   Entered and Authorized by:   Michel Bickers MD   Signed by:   Michel Bickers MD on 11/02/2009   Method used:   Electronically to        [facility].* # [phone]* (retail)       [address], [place]  [address]       Ph: [phone]       Fax: [phone]   RxID:   [id number] TRUVADA 200-300 MG TABS (EMTRICITABINE-TENOFOVIR) Take 1 tablet by mouth once a day  #30 x 11   Entered and Authorized by:   Michel Bickers MD   Signed by:   Michel Bickers MD on 11/02/2009   Method used:   Electronically to        [facility] # [phone]* (retail)       [address]       [place], [place]  [address]       Ph:  [phone]       Fax: [phone]   RxID:   [id number]    Influenza Vaccine    Vaccine Type: Fluvax MCR    Site: left deltoid    Mfr: novartis    Dose: 0.5 ml    Route: IM    Given by: Lorne Skeens RN    Exp. Date: 05/29/2010    Lot #: [id number]  Flu Vaccine Consent Questions    Do you have a history of severe allergic reactions to this vaccine? no    Any prior history of allergic reactions to egg and/or gelatin? no    Do you have a sensitivity to the preservative Thimersol? no    Do you have a past history of Guillan-Barre Syndrome? no    Do you currently have an acute febrile illness? no    Have you ever had a severe reaction to latex? no    Vaccine information given and explained to patient? yes  Appended Document: f/u  - pneumonia vaccine   Pneumovax Vaccine    Vaccine Type: Pneumovax (Medicare)    Site: right deltoid    Mfr: Merck    Dose: 0.5 ml    Route: IM    Given by: Lorne Skeens RN    Exp. Date: 03/20/2011    Lot #: [id number]

## 2010-03-31 NOTE — Assessment & Plan Note (Signed)
Summary: 30MONTH F/U/VS   Referring Cuinn Westerhold:  Michel Bickers Primary Jedediah Noda:  Michel Bickers MD  CC:  follow-up visit and stomach hurting yesterday most of the day.  Did not take anything for it.  No nausea.   Depression assessed.  Pt. requesting refill of Lomotil.  Not currently on his medication list..  History of Present Illness: [name] is in with his interpreter today.  Following his last visit in September he was supposed to start and direct viral medications.  He does have Truvada, Reyataz Norvir here with him today as well as Septra.  He appears to be taking them correctly and to have refilled the medications at least once.  He does not have his Zithromax or Diflucan with him.  It sounds like he may have that at home but I'm not sure if he is taking them or taking them correctly.  He says that he missed several doses of his HIV medicines when he first started it but he has been very consistent over the last month.  He says he has not been sexually active since his last visit because he tends to be sleepier at night and has not been going out to clubs.  He ran out of his Zantac and has been having more epigastric discomfort.  His appetite is good and he has been gaining weight which has caused him some distress.  Depression History:      The patient denies a depressed mood most of the day and a diminished interest in his usual daily activities.         Preventive Screening-Counseling & Management  Alcohol-Tobacco     Alcohol drinks/day: 0     Smoking Status: never  Caffeine-Diet-Exercise     Caffeine use/day: sodas     Does Patient Exercise: no      Type of exercise: walking and running     Exercise (avg: min/session): <30     Times/week: <3  Hep-HIV-STD-Contraception     HIV Risk: no risk noted     HIV Risk Counseling: not indicated-no HIV risk noted  Safety-Violence-Falls     Seat Belt Use: yes  Comments: condoms declined      Sexual History:  n/a.        Drug Use:   never.     Current Allergies (reviewed today): No known allergies  Social History: Sexual History:  n/a  Vital Signs:  Patient profile:   30 year old male Height:      69 inches (175.26 cm) Weight:      194.5 pounds (88.41 kg) BMI:     28.83 Temp:     98.1 degrees F (36.72 degrees C) oral Pulse rate:   99 / minute BP sitting:   124 / 87  (left arm) Cuff size:   large  Vitals Entered By: Lorne Skeens RN (December 30, 2009 9:57 AM) CC: follow-up visit, stomach hurting yesterday most of the day.  Did not take anything for it.  No nausea.   Depression assessed.  Pt. requesting refill of Lomotil.  Not currently on his medication list. Is Patient Diabetic? No Pain Assessment Patient in pain? yes     Location: upper abdoman Intensity: 3 Type: hurting Onset of pain  Intermittent Nutritional Status BMI of 25 - 29 = overweight Nutritional Status Detail appetite "OK"  Have you ever been in a relationship where you felt threatened, hurt or afraid?No   Does patient need assistance? Functional Status Self care Ambulation Normal Comments  no missed doses according to the pt.    Physical Exam  General:  alert and well-nourished.   Mouth:  pharynx pink and moist, no erythema, and no exudates.  he had one tooth pulled recently. Lungs:  normal respiratory effort, no crackles, and no wheezes.   Heart:  normal rate, regular rhythm, no murmur, and no gallop.   Abdomen:  soft, non-tender, normal bowel sounds, no hepatomegaly, and no splenomegaly.   Skin:  no rashes.   Psych:  normally interactive, good eye contact, not anxious appearing, and not depressed appearing.     Impression & Recommendations:  Problem # 1:  HIV DISEASE (ICD-042) It does sound like he has been taking his HIV medications correctly and fairly consistent recently but I don't think he is taking his full complement of prophylactic medications.  He has had difficulty getting up with the parents counselor, Suanne Marker,  but we will try to arrange for them to me later today with the interpreter.  I will recheck his blood work today and have him follow-up in 6 weeks.  I have encouraged him to remain abstinent from sex.  I told him that I believe that the reason that his HIV he seemed out of control lately two year was probably that he was infected or superinfected with a more aggressive strain of the virus. His updated medication list for this problem includes:    Septra Ds 800-160 Mg Tabs (Sulfamethoxazole-trimethoprim) .Marland Kitchen... Take 1 tablet by mouth once a day    Diflucan 100 Mg Tabs (Fluconazole) .Marland Kitchen... Take 1 tablet by mouth once a week    Zithromax 600 Mg Tabs (Azithromycin) .Marland Kitchen... Take 2 tablets by mouth once a week on tuesdays  Diagnostics Reviewed:  HIV: CDC-defined AIDS (07/20/2009)   CD4: 90 (11/16/2009)   WBC: 4.6 (11/15/2009)   Hgb: 15.7 (11/15/2009)   HCT: 45.6 (11/15/2009)   Platelets: 211 (11/15/2009) HIV-1 RNA: 584000 (11/15/2009)   HBSAg: NO (04/23/2006)  Problem # 2:  GERD (ICD-530.81) I told him that he could take his Zantac again as long as he takes it at bedtime apart from his other medications. The following medications were removed from the medication list:    Zantac 150 Mg Tabs (Ranitidine hcl) .Marland Kitchen... Take 1 tablet by mouth once a day  Orders: T-CD4SP [facility]) (CD4SP)  Other Orders: T-HIV Viral Load [phone]) T-Comprehensive Metabolic Panel ([id number]) Est. Patient Level IV (32549)  Patient Instructions: 1)  Please schedule a follow-up appointment in 6 weeks.     Process Orders Check Orders Results:     [facility]: Check successful Tests Sent for requisitioning (December 30, 2009 10:45 AM):     12/30/2009: [facility] -- T-HIV Viral Load [phone] (signed)     12/30/2009: [facility] -- T-Comprehensive Metabolic Panel [[id number]] (signed)

## 2010-03-31 NOTE — Miscellaneous (Signed)
Summary: Homecare Providers  Homecare Providers   Imported By: Bonner Puna 01/11/2010 16:17:52  _____________________________________________________________________  External Attachment:    Type:   Image     Comment:   External Document

## 2010-03-31 NOTE — Progress Notes (Signed)
Summary: Pt. going to ED  Phone Note Call from Patient   Caller: Patient Reason for Call: Acute Illness Summary of Call: Pt. called via interpreter to inform Dr. [name] that he was going to the ER for diarrhea and blood in stool. Initial call taken by: Myrtis Hopping CMA [facility]),  September 02, 2009 11:09 AM

## 2010-03-31 NOTE — Progress Notes (Signed)
Summary: Drug interaction  Phone Note From Pharmacy   Caller: [facility].* # [phone]* Details for Reason: drug interaction Summary of Call: Received a fax from [facility] that being on the prescription for Fluconazole will cause cardiac symptoms and interact with Azithromycin. Pharmacy wants to know if you are aware of this and if you want patient to continue with the prescriptions as is? Initial call taken by: Canary Brim  BS,CPht II,MPH,  July 29, 2009 3:28 PM  Follow-up for Phone Call        Spoke to nurse who stated to inform pharmacy that doctor is aware and patient was given samples.  Please don't fill. Follow-up by: Canary Brim  BS,CPht II,MPH,  July 29, 2009 3:35 PM

## 2010-03-31 NOTE — Miscellaneous (Signed)
Summary: Bridge Counselor  Clinical Lists Changes BC phone client via relay..client unavailable, BC left a voice message, BC identified herself, inquired about sevices client needed as it relates to his medical care, and left message for client to phone [facility] back.

## 2010-03-31 NOTE — Miscellaneous (Signed)
  Clinical Lists Changes  Observations: Added new observation of YEARAIDSPOS: 2011  (01/25/2010 12:20)

## 2010-03-31 NOTE — Miscellaneous (Signed)
Summary: Home Care: Medical Case Mgt.  Home Care: Medical Case Mgt.   Imported By: Bonner Puna 03/14/2010 13:54:44  _____________________________________________________________________  External Attachment:    Type:   Image     Comment:   External Document

## 2010-03-31 NOTE — Progress Notes (Signed)
Summary: "bumps" on face causing concern  Phone Note Call from Patient Call back at Home Phone [phone]   Caller: Patient Reason for Call: Acute Illness Summary of Call: Pt. left message.  Has bumps on his face which he is very concerned about.  Requested e-mail address to send pictures.  RN returned his call through the video Primary school teacher.  RN left message.  The [facility] is closed tomorrow.  Pt. given Dr. Corrie Dandy e-mail address.  RN requested that pt. go to local Urgent Care if the "bumps" are causing him concern.  The clinic will reopen 05/03/2009. Lorne Skeens RN  April 29, 2009 5:27 PM

## 2010-03-31 NOTE — Progress Notes (Signed)
Summary: ? hemorrhoids, to make an appt.  Phone Note Call from Patient Call back at [phone]   Caller: Patient Reason for Call: Acute Illness, Talk to Nurse Summary of Call: Message left stating that he believes that he has "hemorrhoids" and requesting medication.   RN returned call.  Message left requesting that pt. call the office for an appt. w/ Dr. Tomma Lightning to evaluate this problem.  Lorne Skeens RN  September 22, 2009 10:03 AM
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
## 2010-03-31 NOTE — Progress Notes (Signed)
Summary: Having difficulty understanding rxes, Bridge Counselor referral  Phone Note Call from Patient   Caller: Patient Reason for Call: Talk to Nurse Summary of Call: Call through video-relay operator.  Pt. c/o confusion about how to take his newly prescribed HIV medications.  Agreed to have a [employer] visit him at home to clear up some of his confusion and answer his questions.  For example, he asked about his fluconazole.  RN emphasized that he takes it once a week and that when he needs a refill his should call [facility] for a refill.  The clinic supplied his first month's prescription due to the pt. not having received his SSI check to pay for the rxes.  RN will make a Bridge Counseling referral for the pt.   RN advised the pt. that the counselor would call early next week to set up the first home visit.   Pt. verbalized understanding. Lorne Skeens RN  August 05, 2009 5:17 PM

## 2010-03-31 NOTE — Consult Note (Signed)
Summary: [facility]   Imported By: Bonner Puna 07/09/2009 16:45:13  _____________________________________________________________________  External Attachment:    Type:   Image     Comment:   External Document

## 2010-03-31 NOTE — Progress Notes (Signed)
Summary: GI symptoms cont., not taking rxes, no $$, appt. for 07/27/09  Phone Note Call from Patient Call back at Home Phone [phone]   Caller: Patient Call For: RN Reason for Call: Talk to Nurse Action Taken: Phone Call Completed, Appt Scheduled Today Summary of Call: GI symptoms continuing.  RN asked whether the pt. has started taking the medications that Dr. Louis Meckel at his last visit.  The pt. stated that he has not had the $$ to buy the medications untill he receives his SSI check this coming Friday.  He is no longer employed and does not have private insurance to use a co-pay discount card for his Atripla.  RN advised pt. to apply for Medicaid ASAP to help pay for medications and medical care in addition to his Medicare.  RN asked the pt. whether he felt he needed to be seen by a MD.  Pt. stated that he would like to see a MD.  Appt. made for today w/ Dr. Tomma Lightning.  Interpreter services called. .sign

## 2010-04-06 NOTE — Progress Notes (Signed)
  Phone Note Call from Patient   Caller: Patient Reason for Call: Acute Illness Action Taken: Patient advised to go to ER Details for Reason: c/o chest pressure Summary of Call: using video relay, Pt stated he has had intermittant chest pressure. stated he has had this checked out & mds told him he is fine. he wants md here to "really check him out" he is using PPI w/o relief. no other symptoms. suggested ED. he refused citing money. has medicare. has no ride until friday or next monday. he will call front desk to make the appt but urged him to go to ED if pain is bad or gets worse. again he refused Initial call taken by: Elige Radon RN,  March 28, 2010 12:01 PM

## 2010-04-20 NOTE — Miscellaneous (Signed)
Summary: Homecare Provider: [place] Provider: [place] By: Bonner Puna 04/11/2010 13:16:18  _____________________________________________________________________  External Attachment:    Type:   Image     Comment:   External Document

## 2010-04-22 ENCOUNTER — Telehealth: Payer: Self-pay | Admitting: *Deleted

## 2010-04-26 NOTE — Progress Notes (Signed)
  Phone Note Call from Patient   Caller: Patient Summary of Call: pt called (via [place] relay) & asked what to do about his " extreme exaustion" he thinks it is his meds. started 3-4 wks ago. denies CP, SOB. asked him to make appt & see md. states he has a hard time getting out of bed in am. he agreed to make appt Initial call taken by: Elige Radon RN,  April 22, 2010 10:55 AM

## 2010-04-29 ENCOUNTER — Encounter: Payer: Self-pay | Admitting: Internal Medicine

## 2010-05-05 ENCOUNTER — Telehealth (INDEPENDENT_AMBULATORY_CARE_PROVIDER_SITE_OTHER): Payer: Self-pay | Admitting: *Deleted

## 2010-05-10 NOTE — Progress Notes (Signed)
Summary: Pt. to call for appt. with Raford Pitcher. to apply for SPAP  Phone Note Call from Patient Call back at [facility] Phone [phone]   Caller: Patient Summary of Call: Pt. had received one of the flyer from [facility] to come for an appt. to recertify.  RN stated that pt. would need an appt. to apply for SPAP.  Pt. given tel # for Barb to call for appt.  Pt. also had concerns about other monetary issues.  RN advised pt. to call [facility] in [place] to discuss these issues.  Lorne Skeens RN  May 05, 2010 9:15 AM

## 2010-05-10 NOTE — Miscellaneous (Signed)
Summary: [facility]: [place] By: Bonner Puna 05/06/2010 10:03:28  _____________________________________________________________________  External Attachment:    Type:   Image     Comment:   External Document

## 2010-05-12 LAB — T-HELPER CELL (CD4) - (RCID CLINIC ONLY): CD4 T Cell Abs: 90 uL — ABNORMAL LOW (ref 400–2700)

## 2010-05-15 LAB — T-HELPER CELL (CD4) - (RCID CLINIC ONLY): CD4 % Helper T Cell: 16 % — ABNORMAL LOW (ref 33–55)

## 2010-05-31 ENCOUNTER — Ambulatory Visit: Payer: Self-pay | Admitting: Adult Health

## 2010-06-03 LAB — POCT CARDIAC MARKERS
CKMB, poc: 6.6 ng/mL (ref 1.0–8.0)
Myoglobin, poc: 196 ng/mL (ref 12–200)
Troponin i, poc: <0.05 ng/mL (ref 0.00–0.09)

## 2010-06-03 LAB — POCT I-STAT, CHEM 8
BUN: 9 mg/dL (ref 6–23)
Calcium, Ion: 1.09 mmol/L — ABNORMAL LOW (ref 1.12–1.32)
Chloride: 101 mEq/L (ref 96–112)
HCT: 45 % (ref 39.0–52.0)
Sodium: 137 mEq/L (ref 135–145)
TCO2: 26 mmol/L (ref 0–100)

## 2010-06-03 LAB — COMPREHENSIVE METABOLIC PANEL WITH GFR
ALT: 29 U/L (ref 0–53)
AST: 39 U/L — ABNORMAL HIGH (ref 0–37)
Albumin: 3.7 g/dL (ref 3.5–5.2)
Alkaline Phosphatase: 92 U/L (ref 39–117)
BUN: 9 mg/dL (ref 6–23)
CO2: 28 meq/L (ref 19–32)
Calcium: 8.6 mg/dL (ref 8.4–10.5)
Chloride: 104 meq/L (ref 96–112)
Creatinine, Ser: 1.03 mg/dL (ref 0.4–1.5)
GFR calc non Af Amer: >60 mL/min
Glucose, Bld: 98 mg/dL (ref 70–99)
Potassium: 3.7 meq/L (ref 3.5–5.1)
Sodium: 139 meq/L (ref 135–145)
Total Bilirubin: 0.5 mg/dL (ref 0.3–1.2)
Total Protein: 8.5 g/dL — ABNORMAL HIGH (ref 6.0–8.3)

## 2010-06-03 LAB — DIFFERENTIAL
Basophils Absolute: 0 K/uL (ref 0.0–0.1)
Basophils Relative: 0 % (ref 0–1)
Eosinophils Absolute: 0 K/uL (ref 0.0–0.7)
Eosinophils Relative: 0 % (ref 0–5)
Lymphocytes Relative: 32 % (ref 12–46)
Lymphocytes Relative: 60 % — ABNORMAL HIGH (ref 12–46)
Lymphs Abs: 2.9 10*3/uL (ref 0.7–4.0)
Lymphs Abs: 3.3 K/uL (ref 0.7–4.0)
Monocytes Absolute: 0.8 K/uL (ref 0.1–1.0)
Monocytes Relative: 16 % — ABNORMAL HIGH (ref 3–12)
Monocytes Relative: 8 % (ref 3–12)
Neutro Abs: 1 10*3/uL — ABNORMAL LOW (ref 1.7–7.7)
Neutro Abs: 6 K/uL (ref 1.7–7.7)
Neutrophils Relative %: 22 % — ABNORMAL LOW (ref 43–77)
Neutrophils Relative %: 59 % (ref 43–77)

## 2010-06-03 LAB — URINALYSIS, ROUTINE W REFLEX MICROSCOPIC
Bilirubin Urine: NEGATIVE
Glucose, UA: NEGATIVE mg/dL
Hgb urine dipstick: NEGATIVE

## 2010-06-03 LAB — CBC
HCT: 42.6 % (ref 39.0–52.0)
Hemoglobin: 14.2 g/dL (ref 13.0–17.0)
MCHC: 33.9 g/dL (ref 30.0–36.0)
MCHC: 34.5 g/dL (ref 30.0–36.0)
MCV: 89.3 fL (ref 78.0–100.0)
Platelets: 173 10*3/uL (ref 150–400)
RBC: 4.77 MIL/uL (ref 4.22–5.81)
RDW: 13 % (ref 11.5–15.5)

## 2010-06-03 LAB — LIPASE, BLOOD: Lipase: 18 U/L (ref 11–59)

## 2010-06-03 LAB — D-DIMER, QUANTITATIVE: D-Dimer, Quant: 0.33 ug/mL-FEU (ref 0.00–0.48)

## 2010-06-07 LAB — T-HELPER CELL (CD4) - (RCID CLINIC ONLY): CD4 % Helper T Cell: 21 % — ABNORMAL LOW (ref 33–55)

## 2010-06-08 LAB — POCT I-STAT, CHEM 8
Creatinine, Ser: 1 mg/dL (ref 0.4–1.5)
Hemoglobin: 16.3 g/dL (ref 13.0–17.0)
Potassium: 3.8 mEq/L (ref 3.5–5.1)
Sodium: 137 mEq/L (ref 135–145)

## 2010-06-08 LAB — CBC
MCHC: 34.7 g/dL (ref 30.0–36.0)
RDW: 12.8 % (ref 11.5–15.5)

## 2010-06-08 LAB — HEPATIC FUNCTION PANEL
AST: 38 U/L — ABNORMAL HIGH (ref 0–37)
Albumin: 3.7 g/dL (ref 3.5–5.2)
Bilirubin, Direct: 0.2 mg/dL (ref 0.0–0.3)

## 2010-06-08 LAB — LIPASE, BLOOD: Lipase: 15 U/L (ref 11–59)

## 2010-06-08 LAB — URINALYSIS, ROUTINE W REFLEX MICROSCOPIC
Leukocytes, UA: NEGATIVE
Nitrite: NEGATIVE
Specific Gravity, Urine: 1.039 — ABNORMAL HIGH (ref 1.005–1.030)
pH: 6 (ref 5.0–8.0)

## 2010-06-08 LAB — URINE MICROSCOPIC-ADD ON

## 2010-06-08 LAB — DIFFERENTIAL
Basophils Absolute: 0 10*3/uL (ref 0.0–0.1)
Basophils Relative: 1 % (ref 0–1)
Eosinophils Absolute: 0 10*3/uL (ref 0.0–0.7)
Monocytes Absolute: 1 10*3/uL (ref 0.1–1.0)
Neutro Abs: 2.2 10*3/uL (ref 1.7–7.7)
Neutrophils Relative %: 36 % — ABNORMAL LOW (ref 43–77)

## 2010-06-09 ENCOUNTER — Ambulatory Visit: Payer: Self-pay

## 2010-06-14 ENCOUNTER — Other Ambulatory Visit: Payer: Self-pay | Admitting: *Deleted

## 2010-06-14 DIAGNOSIS — B20 Human immunodeficiency virus [HIV] disease: Secondary | ICD-10-CM

## 2010-06-14 MED ORDER — RITONAVIR 100 MG PO CAPS: 100.0000 mg | ORAL_CAPSULE | Freq: Every day | ORAL | Status: DC

## 2010-06-14 MED ORDER — SULFAMETHOXAZOLE-TMP DS 800-160 MG PO TABS: 1.0000 | ORAL_TABLET | Freq: Every day | ORAL | Status: DC

## 2010-06-14 MED ORDER — AZITHROMYCIN 600 MG PO TABS: 1200.0000 mg | ORAL_TABLET | ORAL | Status: DC

## 2010-06-14 MED ORDER — FLUCONAZOLE 100 MG PO TABS
100.0000 mg | ORAL_TABLET | ORAL | Status: DC
Start: 2010-06-14 — End: 2010-09-13

## 2010-06-14 MED ORDER — EMTRICITABINE-TENOFOVIR DF 200-300 MG PO TABS: 1.0000 | ORAL_TABLET | Freq: Every day | ORAL | Status: DC

## 2010-06-14 MED ORDER — ATAZANAVIR SULFATE 300 MG PO CAPS: 300.0000 mg | ORAL_CAPSULE | Freq: Every day | ORAL | Status: DC

## 2010-06-16 ENCOUNTER — Ambulatory Visit: Payer: Self-pay | Admitting: Internal Medicine

## 2010-06-21 ENCOUNTER — Ambulatory Visit: Payer: Self-pay | Admitting: Internal Medicine

## 2010-06-28 ENCOUNTER — Encounter: Payer: Self-pay | Admitting: Internal Medicine

## 2010-06-28 ENCOUNTER — Ambulatory Visit (INDEPENDENT_AMBULATORY_CARE_PROVIDER_SITE_OTHER): Payer: Medicare Other | Admitting: Internal Medicine

## 2010-06-28 DIAGNOSIS — B20 Human immunodeficiency virus [HIV] disease: Secondary | ICD-10-CM

## 2010-06-28 DIAGNOSIS — K219 Gastro-esophageal reflux disease without esophagitis: Secondary | ICD-10-CM | POA: Insufficient documentation

## 2010-06-28 MED ORDER — OMEPRAZOLE 20 MG PO CPDR: 20.0000 mg | DELAYED_RELEASE_CAPSULE | Freq: Every day | ORAL | Status: DC

## 2010-06-28 MED ORDER — DARUNAVIR ETHANOLATE 400 MG PO TABS: 800.0000 mg | ORAL_TABLET | ORAL | Status: DC

## 2010-06-28 NOTE — Assessment & Plan Note (Signed)
I suspect he has GERD and will start an AM dose of Prilosec and see him back in 2 months.

## 2010-06-28 NOTE — Assessment & Plan Note (Signed)
His medications should be getting refills today or tomorrow. I instructed him to always take only a complete regimen. I will switch Reyataz to Prezista so he can start a PPI.

## 2010-06-28 NOTE — Progress Notes (Signed)
  Subjective:    Patient ID: Matthew Fitzpatrick, male    DOB: 1980-10-01, 30 y.o.   MRN: [id number]  HPI [name] is in for his routine visit. He ran out of his meds about 1 1/2 weeks ago. They did not all run out at once so he took an incomplete regimen for several days before they all ran out. He did reapply for ADAP but has not gotten refills yet. He denies missing doses before he ran out. He has been having chest tightness and heartburn. It occurs after eating, particularly spicy foods. It did not improve with 1 dose of Prilosec.    Review of Systems     Objective:   Physical Exam  Constitutional: He appears well-developed and well-nourished. No distress.  HENT:  Mouth/Throat: Oropharynx is clear and moist. No oropharyngeal exudate.  Eyes: Conjunctivae are normal.  Cardiovascular: Normal rate, regular rhythm and normal heart sounds.   No murmur heard. Pulmonary/Chest: Breath sounds normal. He has no wheezes. He has no rales. He exhibits no tenderness.  Abdominal: Soft. Bowel sounds are normal. There is no tenderness.  Skin: No rash noted.  Psychiatric: He has a normal mood and affect.          Assessment & Plan:

## 2010-07-12 ENCOUNTER — Other Ambulatory Visit (INDEPENDENT_AMBULATORY_CARE_PROVIDER_SITE_OTHER): Payer: Medicare Other | Admitting: *Deleted

## 2010-07-12 DIAGNOSIS — K219 Gastro-esophageal reflux disease without esophagitis: Secondary | ICD-10-CM

## 2010-07-12 MED ORDER — OMEPRAZOLE 20 MG PO CPDR: 20.0000 mg | DELAYED_RELEASE_CAPSULE | Freq: Every day | ORAL | Status: DC
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
## 2010-07-15 NOTE — Discharge Summary (Signed)
NAME:  Matthew Fitzpatrick, Matthew Fitzpatrick                       ACCOUNT NO.:  [id number]   MEDICAL RECORD NO.:  [id number]                   PATIENT TYPE:  INP   LOCATION:  8546                                 FACILITY:  [facility]   PHYSICIAN:  Fay Records, M.D.                 DATE OF BIRTH:  18-Mar-1980   DATE OF ADMISSION:  03/20/2003  DATE OF DISCHARGE:  03/23/2003                                 DISCHARGE SUMMARY   DISCHARGE DIAGNOSES:  1. Major depressive disorder, recurrent, in partial remission.  2. Previously admitted to behavioral health department March 18, 2003, for     suicidal ideations.  3. Paroxysmal atrial fibrillation with rapid ventricular rate.     A. Converted to normal sinus rhythm this admission.  4. Human immunodeficiency virus positive.  5. Normal left ventricular function.   HISTORY OF PRESENT ILLNESS:  Please see the admission history and physical  for complete details. This 30 year old male with no prior cardiac  history  and congenital deafness was admitted to the behavioral health department on  March 18, 2003, for suicidal ideations. He had lost his job recently and  stated what do I have to live for? He became agitated and combative on the  morning of admission to [facility]. His heart rate was noted to be  irregular. He was referred to [facility] for further evaluation.   Electrocardiogram revealed atrial fibrillation with a rapid ventricular rate  in the 120s. He was treated with Diltiazem 30 mg p.o. q.6h. His TSH was  checked and this was normal at 0.920. He converted back to normal sinus  rhythm. His cardiac enzymes were inconsistent with myocardial infarction. A  2D echocardiogram was checked and was read out as normal.   HOSPITAL COURSE:  The patient was followed  by  sitters throughout his  entire admission for safety. Several attempts were made to get the patient  transferred to [facility]. The transfer never went through.  Finally on March 23, 2003, Dr. Rhona Raider of the behavioral health  department saw the patient. He diagnosed him with major depressive disorder,  recurrent, in partial remission. He noted that the patient had a strong  reactive component that had greatly improved after support from friends and  learning of new supportive residence to stay at while he was hospitalized at  [facility]. The patient declined psychiatric inpatient and Dr.  Rhona Raider felt that the patient was not committable.   Dr. Rhona Raider noted that the patient opted for outpatient treatment. Dr.  Rhona Raider also noted that the patient agreed to call the psychiatric  emergency hotline for distress or suicidal ideation. Dr. Rhona Raider set the  patient up with followup appointment on Tuesday, March 24, 2003, at [facility] at 3 p.m. He also provided him with  an emergency number if it were needed. The patient requires on further  cardiac  workup. He is to follow up with his primary care physician, Dr.  [name] as necessary.   LABORATORY DATA:  White count 9100, hemoglobin 15.2, hematocrit 45.6,  platelet count 190,000. INR 1.2. Sodium 136, potassium 3.8, chloride 103,  CO2 28, glucose 109, BUN 9, creatinine 1.1. Total bilirubin 0.7, alkaline  phosphatase 93, AST 28, ALT 18. Total protein 8.6, albumin  3.8, calcium  9.3. Cardiac enzymes as noted above. TSH as noted above.   DISCHARGE MEDICATIONS:  Dr. Rhona Raider recommended that the patient be sent  home on Zoloft 25 mg every day. This would be titrated up at outpatient  behavioral health.   FOLLOW UP:  The patient will see [facility] at 3 p.m. Tuesday March 24, 2003. He has been provided with a phone  number. He has also bee provided with a phone number for psychiatric  emergencies. The patient is to see Dr. [name], his primary care physician,  as needed.      Richardson Dopp, P.A.                         Fay Records, M.D.    SW/MEDQ  D:  03/23/2003  T:  03/23/2003  Job:  [id number]   cc:   Dr. Harlow Mares, M.D.  [address] [place],  [address]  Fax: [phone]

## 2010-07-15 NOTE — H&P (Signed)
NAME:  Matthew Fitzpatrick, Matthew Fitzpatrick                       ACCOUNT NO.:  [id number]   MEDICAL RECORD NO.:  [id number]                   PATIENT TYPE:  INP   LOCATION:  9528                                 FACILITY:  [facility]   PHYSICIAN:  Fay Records, M.D.                 DATE OF BIRTH:  01/19/1981   DATE OF ADMISSION:  03/20/2003  DATE OF DISCHARGE:                                HISTORY & PHYSICAL   HISTORY:  The patient is a 30 year old who presented to the emergency room  for evaluation of atrial fibrillation.   HISTORY OF PRESENT ILLNESS:  The patient has no prior history of atrial  fibrillation. He was admitted on the 19th to behavioral health. This morning  became extremely combative and agitated. Given 2 mg Ativan and 20 mg Geodon.  Vital signs were taken. The heart rate was noted to be increased and  irregular. EKG showed atrial fibrillation with a rate of 110 beats per  minute. He was transferred to [facility]. There, telemetry showed  atrial fibrillation in the 120s. On talking with the patient he denies  palpitations in the past or present. Denies shortness of breath, no chest  pain. Denies stimulant use. No other drugs.   ALLERGIES:  No known drug allergies.   MEDICATIONS:  1. Zoloft 25 daily.  2. Ativan 1 q.6h.  3. Ambien 10 q.h.s.  4. Geodon 10 mg IM p.r.n., 20 mg IV in the emergency room.   PAST MEDICAL HISTORY:  1. Suicidal ideation.  2. HIV positive.  3. History of umbilical hernia.  4. History of congenital deafness.   SOCIAL HISTORY:  The patient lives in [place]. 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 Does not smoke. Does not drink.   FAMILY HISTORY:  No history of coronary artery disease or cardiac problems.   REVIEW OF SYSTEMS:  All systems reviewed negative, above problems as noted.   PHYSICAL EXAMINATION:  GENERAL:  The patient is in no acute distress.  Sleeping, but arousable and answers questions.  VITAL SIGNS:  Blood pressure 112/63, pulse is now  in the 100s, temperature  98.  NECK:  JVP is normal.  LUNGS:  Mild wheezes bilaterally.  CARDIAC:  Irregularly irregular. Normal S1, S2. No S3, no murmur.  ABDOMEN:  Benign.  EXTREMITIES:  No edema, 2+ pulses.   LABORATORY DATA:  A 12-lead EKG shows atrial fibrillation, rate 110-120.   Labs pending.   IMPRESSION:  The patient is a 30 year old with newly documented atrial  fibrillation. He is asymptomatic. Not clear how long he has been in this. We  will schedule a TSH, echocardiogram, p.o. Diltiazem 30 q.6h. if  hemodynamically stable. Admit to telemetry and follow response.  Fay Records, M.D.    PVR/MEDQ  D:  03/20/2003  T:  03/21/2003  Job:  [id number]
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
## 2010-07-15 NOTE — Op Note (Signed)
NAMEMarland Kitchen  Matthew Fitzpatrick, Matthew Fitzpatrick NO.:  [id number]   MEDICAL RECORD NO.:  [id number]          PATIENT TYPE:  INP   LOCATION:  1832                         FACILITY:  [facility]   PHYSICIAN:  Jeryl Columbia, M.D.    DATE OF BIRTH:  02-15-1981   DATE OF PROCEDURE:  01/11/2006  DATE OF DISCHARGE:                                 OPERATIVE REPORT   PROCEDURE:  Flexible sigmoidoscopy with biopsy.   INDICATIONS:  Blood per rectum, abnormal proctoscopy on CT scan worrisome  for proctitis with also rectal pain.  Consent was signed after risks,  benefits, methods, options were thoroughly discussed through an interpreter.   MEDICINES USED:  Fentanyl 100 mcg, Versed 6 mg.   PROCEDURE:  Rectal inspection is okay.  Digital exam was tender, no obvious  mass.  The pediatric video adjustable colonoscope was inserted and he had  significant ulcerative proctitis.  There was one main ulcer but some shallow  ulcers as well.  It did not have a specific appearance like herpes or  specifically CMV; it was only involving the distal rectum.  The more  proximal rectum was normal.  The scope was advanced to about 25 cm.  He had  trouble holding the air.  There was some stool seen.  He was unprepped,  other than some wall edema, the proximal rectum and distal sigmoid was  normal.  The scope was withdrawn back to the distal rectum.  Multiple  biopsies were obtained including one for viral culture.  The scope was not  retroflexed; however, anorectal pull-through confirms the above findings.  The Scope was removed.  The patient tolerated the procedure well.  There  were no obvious immediate complications.   ENDOSCOPIC DIAGNOSES:  1. Significant distal proctitis and ulcerations status post biopsy      including customary path and one for viral culture  2. Otherwise within normal limits to 25 cm except for some wall edema, but      unable to hold the air; visualization difficult.   PLAN:  Await pathology.   Let me know if I can help; otherwise return care to  the __________ team.           ______________________________  Jeryl Columbia, M.D.     MEM/MEDQ  D:  01/11/2006  T:  01/11/2006  Job:  [id number]   cc:   Alison Murray, M.D.

## 2010-07-15 NOTE — Consult Note (Signed)
NAMEMarland Kitchen  Matthew, Fitzpatrick NO.:  [id number]   MEDICAL RECORD NO.:  [id number]          PATIENT TYPE:  INP   LOCATION:  7628                         FACILITY:  [facility]   PHYSICIAN:  Matthew Fitzpatrick, M.D.    DATE OF BIRTH:  24-Sep-1980   DATE OF CONSULTATION:  01/11/2006  DATE OF DISCHARGE:                                   CONSULTATION   REASON FOR CONSULTATION:  Rectal bleeding.   HISTORY OF PRESENT ILLNESS:  Matthew Fitzpatrick is a 30 year old African  American male.  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  He developed severe,  intermittent, crampy abdominal pain with green diarrhea on approximately  December 27, 2005.  On Sunday, January 07, 2006, he attempted a warm tap  water enema with a green suction bulb and caused rectal bleeding.  Green  diarrhea and intermittent dark red bleeding have persisted until today.  He  is positive for vomiting now in the ER, but not previous.  Positive for  weakness.  Positive for anal intercourse.  Positive for increased pain when  sitting or when moving or walking.  Positive for dark red blood per rectum.  Questionable fever.  He mentions that, 2 days ago, he had chills.  Negative  for the rest of the review of systems including headache, sore throat,  muscle aches, or pain.   PAST MEDICAL HISTORY:  Significant for abdominal surgery.  It appears that  he had an umbilical hernia repair.  This is from his description, it is not  in the E-chart.  He is HIV positive.  He has had 2 admissions for depression  and suicidal ideations.  His primary doctor is Dr. Michel Fitzpatrick.   REVIEW OF SYSTEMS:  Significant for chronic sinus problems, chest  congestion.  He says that he snores and that he is mouth breather when he  sleeps.   ALLERGIES:  NO KNOWN DRUG ALLERGIES.   MEDICATIONS:  None.   SOCIAL HISTORY:  He lives alone.  He is a deaf/mute.  He denies tobacco,  alcohol, and drugs.  He is a homosexual.   FAMILY HISTORY:  Not  collected.   PHYSICAL EXAMINATION:  GENERAL:  The patient is alert and oriented and in no  apparent distress.  CARDIOVASCULAR:  Regular rate and rhythm.  RESPIRATORY:  He has increased breath sounds with questionable expiratory  wheezing, which cleared with cough.  ABDOMEN:  Firm, nondistended with positive bowel sounds.  He is tender in  his lower quadrants bilaterally.  He has no organomegaly noted.  RECTAL:  External rectal exam shows that there is no blood externally at  this time.  He has 3 to 4 skin tags right at the anal opening.  The patient  started to vomit while I was doing his physical exam, he vomited a large  amount of yellow fluid with food and an acidic odor.   LABORATORY DATA:  Hemoglobin 14.8, white count 11.5.  His chem-7 is within  normal limits.  His CD4 is 640.  T-helper cells are at 28%.  We will order  an amylase.  His  CT scan, today, shows proctitis.   ASSESSMENT:  This is a 30 year old male who was seen and examined by Dr.  Clarene Fitzpatrick.  He is acutely ill with infection.  We will schedule a flexible  sigmoidoscopy today with biopsies.  We will prep with a bottle of magnesium  citrate.  Flexible sigmoidoscopy with be today at approximately 3:00 p.m.  with Dr. Watt Fitzpatrick.      Matthew Alar, PA    ______________________________  Matthew Fitzpatrick, M.D.    MLY/MEDQ  D:  01/11/2006  T:  01/12/2006  Job:  [id number]   cc:   Matthew Fitzpatrick, M.D.  Teaching Service, Team B

## 2010-07-15 NOTE — Discharge Summary (Signed)
NAMETAWAN, Matthew Fitzpatrick             ACCOUNT NO.:  [id number]   MEDICAL RECORD NO.:  [id number]          PATIENT TYPE:  INP   LOCATION:  5737                         FACILITY:  [facility]   PHYSICIAN:  Alver Fisher, M.D.    DATE OF BIRTH:  01-28-1981   DATE OF ADMISSION:  01/11/2006  DATE OF DISCHARGE:                                 DISCHARGE SUMMARY   PHYSICIAN:  Dr. Lars Mage.   DISCHARGE DIAGNOSES:  1. 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  2. Major depression.  3. Human immunodeficiency virus (HIV) infection with a CD-4 count of 640      on January 09, 2006.  4. Congenital Deafness.  5. Allergic rhinitis.  6. history of paroxysmal atrial fibrillation.   DISCHARGE MEDICATIONS:  1. Doxycycline 100 mg p.o. b.i.d. for ten days.  2. Allegra 180 mg tab P.O daily.  3. Colace 100 mg p.o. once daily for ten days.   FOLLOWUP AND DISPOSITION:  Patient will be followed by Dr. [name] on  January 22, 2006.  At the time of followup he needs to be clinically  assessed for improvement in his proctitis.  He also will need to be followed  up for further management of his HIV.  His viral cultures sent from the  biopsy specimen are still pending and need to be followed up.  His routine  cultures were negative.   IMPORTANT DIAGNOSTIC STUDIES:  Sigmoidoscopy with biopsy was done on  January 12, 2006.  This showed significant proctitis and ulceration.  Routine pathology and viral cultures were sent.  Pathology revealed  ulcerative fragments of rectal tissue with acute on chronic inflammation and  granulation tissue indicative of reactive mucosal changes.  No organisms  were seen.  No malignancy was seen.  A CT scan of the abdomen and pelvis  with contrast was done which showed rectal inflammatory process and enlarged  perirectal and pelvic lymph nodes.  There were small bilateral inguinal  lymph nodes.   CONSULTS:  Dr. Watt Climes from Gastroenterology was consulted.   HISTORY OF  PRESENT ILLNESS:  Matthew Fitzpatrick is a 30 year old African-  American male who is deaf mute and is HIV positive.  Developed severe  intermittent crampy abdominal pain with green diarrhea on October 31.  On  November 11, he attempted one tap water enema with a green suction bulb  which caused rectal bleeding.  He has had persistent green diarrhea and  intermittent dark red bleeding until today.   PAST MEDICAL HISTORY:  1. Depression.  2. A history of paroxysmal atrial fibrillation.  3. HIV.  4. Congenital deafness.  5. A history of chickenpox in childhood.  6. Allergic rhinitis.   MEDICATIONS:  Allegra 180 mg p.o. daily.   SUBSTANCE HISTORY:  Never smoked.  Does not drink.   SOCIAL HISTORY:  Single.  Has had high-school education.  Has Medicaid.   FAMILY HISTORY:  Mother is alive and 67.  Father died at the age of 19  years.   REVIEW OF SYSTEMS:  Negative.   PHYSICAL EXAMINATION:  VITAL SIGNS:  Temperature 100.5, blood pressure  130/80,  pulse 98, respiratory rate 20, O2 sat is 100%.  ABDOMEN:  Soft.  Bowel sounds are present.  Nontender, nondistended.  RECTAL EXAMINATION:  Per the emergency physician, there was discharge,  erythema, and ulcer.   Further examination was normal.   ADMISSION LABS:  Sodium 138, potassium 3.6, chloride 105, bicarb 27, BUN 6,  creatinine 0.08, glucose of 106, hemoglobin of 15, hematocrit 43, WBC count  11.5, glucose of 226.  His LFTs showed a bilirubin of 0.5, AST of 27, ALT of  17.  On imaging, CT scan of the abdomen showed rectal inflammatory process,  enlarged perirectal, pelvic, and inguinal lymph nodes.   HOSPITAL COURSE:  1. Proctitis with ulcerations, likely infectious in etiology.  Patient      presented with greenish diarrhea followed by rectal bleeding.      Sigmoidoscopy showed distal proctitis and ulcerations .  Patient was      initially started on broad-spectrum antibiotics, Zosyn and doxycycline.      During colonoscopy and  sigmoidoscopy, the sample was sent for biopsy      which showed acute and chronic inflammation and granulation tissue      indicative of reactive mucosal changes.  There were no viral inclusion      bodies.  Blood cultures were negative.  Herpes culture from the rectal      biopsy is still pending and needs to be followed up as an outpatient.      His urine for GC and chlamydia are also pending and need to be followed      up.  His CMV cultures also need to be followed up.  As patient showed      significant improvement with IV antibiotics and as no culture data was      available, he was empirically treated with doxycycline, which will be      continued for the next ten days.  At the time of followup he needs      assessment  clinically for improvement in proctitis, viral cultures      will need to be followed up.  2. Depression.  Patient was stable.  He denied a history of suicidal      thoughts. He is not currently on medications.  3. HIV.  His CD-4 count of November 2007 was 640.  He will be followed up      by Dr. [name] in the [facility].  4. Allergic rhinitis.  He will be continued on his Allegra.   At the time of discharge his vital signs were as follows:  Blood pressure  118/68, respiratory rate 18, pulse 84, temperature 98.3.  His labs were as  follows:  His sodium was 138, potassium 3.6, chloride 104, bicarb 29, BUN 3,  creatinine 0.9, and glucose of 103.  WBC 6.6, hemoglobin 13.3, hematocrit  3.7, MCV 88.2, platelets 222.  His GC and chlamydia probe results were  negative, his RPR was negative, his amylase was negative.      Alver Fisher, M.D.  Electronically Signed     YB/MEDQ  D:  01/15/2006  T:  01/15/2006  Job:  [id number]

## 2010-07-29 ENCOUNTER — Telehealth: Payer: Self-pay | Admitting: *Deleted

## 2010-07-29 NOTE — Telephone Encounter (Signed)
Patient called at 4:00 pm c/o trouble breathing and having shallow breaths.  Patient advised to go to ED, patient was agreeable to this. Myrtis Hopping CMA

## 2010-08-01 ENCOUNTER — Emergency Department (HOSPITAL_COMMUNITY)
Admission: EM | Admit: 2010-08-01 | Discharge: 2010-08-03 | Disposition: A | Discharge status: Home / Self Care | Payer: Medicare Other | Attending: Emergency Medicine | Admitting: Emergency Medicine

## 2010-08-01 DIAGNOSIS — R45851 Suicidal ideations: Secondary | ICD-10-CM | POA: Insufficient documentation

## 2010-08-01 DIAGNOSIS — Z21 Asymptomatic human immunodeficiency virus [HIV] infection status: Secondary | ICD-10-CM | POA: Insufficient documentation

## 2010-08-01 DIAGNOSIS — F329 Major depressive disorder, single episode, unspecified: Secondary | ICD-10-CM | POA: Insufficient documentation

## 2010-08-01 DIAGNOSIS — F3289 Other specified depressive episodes: Secondary | ICD-10-CM | POA: Insufficient documentation

## 2010-08-01 LAB — CBC
HCT: 44.2 % (ref 39.0–52.0)
Hemoglobin: 16.1 g/dL (ref 13.0–17.0)
MCHC: 36.4 g/dL — ABNORMAL HIGH (ref 30.0–36.0)
RBC: 5 MIL/uL (ref 4.22–5.81)
WBC: 5.8 10*3/uL (ref 4.0–10.5)

## 2010-08-01 LAB — DIFFERENTIAL
Basophils Absolute: 0.1 10*3/uL (ref 0.0–0.1)
Basophils Relative: 1 % (ref 0–1)
Lymphocytes Relative: 58 % — ABNORMAL HIGH (ref 12–46)
Monocytes Absolute: 0.5 10*3/uL (ref 0.1–1.0)
Neutro Abs: 1.7 10*3/uL (ref 1.7–7.7)
Neutrophils Relative %: 29 % — ABNORMAL LOW (ref 43–77)

## 2010-08-01 LAB — BASIC METABOLIC PANEL
Calcium: 9.6 mg/dL (ref 8.4–10.5)
GFR calc Af Amer: >60 mL/min (ref >60)
GFR calc non Af Amer: >60 mL/min (ref >60)
Potassium: 4.3 mEq/L (ref 3.5–5.1)
Sodium: 136 mEq/L (ref 135–145)

## 2010-08-01 LAB — ETHANOL: Alcohol, Ethyl (B): <11 mg/dL — ABNORMAL HIGH (ref 0–10)

## 2010-08-01 LAB — RAPID URINE DRUG SCREEN, HOSP PERFORMED
Barbiturates: NOT DETECTED
Benzodiazepines: NOT DETECTED
Cocaine: NOT DETECTED
Opiates: NOT DETECTED

## 2010-08-03 DIAGNOSIS — F329 Major depressive disorder, single episode, unspecified: Secondary | ICD-10-CM

## 2010-08-05 ENCOUNTER — Encounter: Payer: Self-pay | Admitting: *Deleted

## 2010-08-30 ENCOUNTER — Other Ambulatory Visit (INDEPENDENT_AMBULATORY_CARE_PROVIDER_SITE_OTHER): Payer: Medicare Other

## 2010-08-30 ENCOUNTER — Other Ambulatory Visit: Payer: Self-pay | Admitting: Infectious Disease

## 2010-08-30 DIAGNOSIS — B20 Human immunodeficiency virus [HIV] disease: Secondary | ICD-10-CM

## 2010-09-01 ENCOUNTER — Telehealth: Payer: Self-pay | Admitting: *Deleted

## 2010-09-01 LAB — T-HELPER CELL (CD4) - (RCID CLINIC ONLY): CD4 % Helper T Cell: 12 % — ABNORMAL LOW (ref 33–55)

## 2010-09-01 NOTE — Telephone Encounter (Signed)
Pt called with question about whether stress could be evaluated by lab values.  The pt has a return appt with Dr. [name] in July.  He agreed to discuss this question at the time of his appt.  Lorne Skeens, RN

## 2010-09-13 ENCOUNTER — Ambulatory Visit (INDEPENDENT_AMBULATORY_CARE_PROVIDER_SITE_OTHER): Payer: Medicare Other | Admitting: Internal Medicine

## 2010-09-13 ENCOUNTER — Encounter: Payer: Self-pay | Admitting: Internal Medicine

## 2010-09-13 VITALS — BP 128/79 | HR 75 | Temp 97.9°F | Ht 68.0 in | Wt 191.0 lb

## 2010-09-13 DIAGNOSIS — Z113 Encounter for screening for infections with a predominantly sexual mode of transmission: Secondary | ICD-10-CM

## 2010-09-13 DIAGNOSIS — B20 Human immunodeficiency virus [HIV] disease: Secondary | ICD-10-CM

## 2010-09-13 DIAGNOSIS — Z79899 Other long term (current) drug therapy: Secondary | ICD-10-CM

## 2010-09-13 NOTE — Progress Notes (Signed)
  Subjective:    Patient ID: Matthew Fitzpatrick, male    DOB: 09/02/1980, 30 y.o.   MRN: [id number]  HPI [name] is in for his routine visit. He states that he has been using his pillbox and the only time he missed doses of his antiretroviral medications was when he was in behavioral [facility] for [facility] days recently when he became very upset with his roommate and needed to go somewhere to calm down. I cannot find a record of that hospitalization today. He states that he was not started on any new medications. His roommate is no longer living with him and he has had no further problems. He is not taking his Septra but describes taking all other medications correctly. He has not needed his proton pump inhibitor recently.    Review of Systems     Objective:   Physical Exam  Constitutional: No distress.       He has gained weight.  HENT:  Mouth/Throat: Oropharynx is clear and moist. No oropharyngeal exudate.  Cardiovascular: Normal heart sounds.   Pulmonary/Chest: Breath sounds normal. He has no wheezes. He has no rales.  Abdominal: Soft. Bowel sounds are normal. He exhibits no distension. There is no tenderness.  Psychiatric: He has a normal mood and affect.          Assessment & Plan:

## 2010-09-13 NOTE — Assessment & Plan Note (Signed)
His HIV infection has come under much better control since starting antiretroviral therapy last year. He can stop all of his prophylactic medications and focus on his 3 anti-retroviral medications.

## 2010-10-03 ENCOUNTER — Telehealth: Payer: Self-pay | Admitting: *Deleted

## 2010-10-03 NOTE — Telephone Encounter (Signed)
Patient called for advise on losing weight.  Advised patient to watch his meal portions and to exercise. Myrtis Hopping CMA

## 2010-10-10 ENCOUNTER — Other Ambulatory Visit: Payer: Self-pay | Admitting: *Deleted

## 2010-10-10 DIAGNOSIS — K219 Gastro-esophageal reflux disease without esophagitis: Secondary | ICD-10-CM

## 2010-10-10 DIAGNOSIS — B20 Human immunodeficiency virus [HIV] disease: Secondary | ICD-10-CM

## 2010-10-10 MED ORDER — DARUNAVIR ETHANOLATE 400 MG PO TABS: 800.0000 mg | ORAL_TABLET | ORAL | Status: DC

## 2010-10-10 MED ORDER — RITONAVIR 100 MG PO CAPS: 100.0000 mg | ORAL_CAPSULE | Freq: Every day | ORAL | Status: DC

## 2010-10-10 MED ORDER — OMEPRAZOLE 20 MG PO CPDR: 20.0000 mg | DELAYED_RELEASE_CAPSULE | Freq: Every day | ORAL | Status: DC

## 2010-10-10 MED ORDER — EMTRICITABINE-TENOFOVIR DF 200-300 MG PO TABS: 1.0000 | ORAL_TABLET | Freq: Every day | ORAL | Status: DC

## 2010-10-12 ENCOUNTER — Other Ambulatory Visit: Payer: Self-pay | Admitting: *Deleted

## 2010-10-12 ENCOUNTER — Ambulatory Visit: Payer: Medicare Other

## 2010-10-12 DIAGNOSIS — K219 Gastro-esophageal reflux disease without esophagitis: Secondary | ICD-10-CM

## 2010-10-12 NOTE — Telephone Encounter (Signed)
All medications were approved for refills on 10/10/10.  Lorne Skeens, RN

## 2010-10-26 ENCOUNTER — Telehealth: Payer: Self-pay | Admitting: *Deleted

## 2010-10-26 NOTE — Telephone Encounter (Signed)
Patient called about his teeth. Advised that he is having dental issues and needs help. I advised the patient of the dental clinic and the waiting list and e seems interested in their services. Due to transportation issues I will mail him an application and he will mail it back.

## 2010-11-03 ENCOUNTER — Telehealth: Payer: Self-pay | Admitting: Licensed Clinical Social Worker

## 2010-11-03 NOTE — Telephone Encounter (Signed)
Patient called and stated that he had leg cramping throughout the night with numbness on one side of his body, he was afraid he may have had a stroke. 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 He replied that he was planning on going on Saturday. I advised him that if it is truly a stroke the hospital would need to see him right in order to treat his symptoms that are sometimes life threatening or irreversible. He replied that he felt better and he would go if the symptoms reappeared. I advised him to make sure he calls 911 or go to the ED if symptoms return.

## 2010-11-14 ENCOUNTER — Telehealth: Payer: Self-pay | Admitting: *Deleted

## 2010-11-14 NOTE — Telephone Encounter (Signed)
Pt given address for [facility] / Joycelyn Schmid to send Dental clinic form.  Lorne Skeens, RN

## 2010-11-18 ENCOUNTER — Telehealth: Payer: Self-pay | Admitting: *Deleted

## 2010-11-18 NOTE — Telephone Encounter (Signed)
Patient called to see if we received the dental form that he mailed to [place]. Advised that when they get the form they will give him a call directly. They do their own scheduling.

## 2010-11-21 LAB — T-HELPER CELL (CD4) - (RCID CLINIC ONLY)
CD4 % Helper T Cell: 25 — ABNORMAL LOW
CD4 T Cell Abs: 710

## 2010-12-06 ENCOUNTER — Other Ambulatory Visit: Payer: Self-pay | Admitting: *Deleted

## 2010-12-06 DIAGNOSIS — B20 Human immunodeficiency virus [HIV] disease: Secondary | ICD-10-CM

## 2010-12-06 DIAGNOSIS — K219 Gastro-esophageal reflux disease without esophagitis: Secondary | ICD-10-CM

## 2010-12-06 MED ORDER — RITONAVIR 100 MG PO CAPS: 100.0000 mg | ORAL_CAPSULE | Freq: Every day | ORAL | Status: DC

## 2010-12-06 MED ORDER — DARUNAVIR ETHANOLATE 400 MG PO TABS: 800.0000 mg | ORAL_TABLET | ORAL | Status: DC

## 2010-12-06 MED ORDER — OMEPRAZOLE 20 MG PO CPDR: 20.0000 mg | DELAYED_RELEASE_CAPSULE | Freq: Every day | ORAL | Status: DC

## 2010-12-06 MED ORDER — EMTRICITABINE-TENOFOVIR DF 200-300 MG PO TABS: 1.0000 | ORAL_TABLET | Freq: Every day | ORAL | Status: DC

## 2010-12-07 LAB — T-HELPER CELL (CD4) - (RCID CLINIC ONLY): CD4 % Helper T Cell: 21 — ABNORMAL LOW

## 2010-12-13 ENCOUNTER — Other Ambulatory Visit: Payer: Self-pay | Admitting: *Deleted

## 2010-12-13 NOTE — Telephone Encounter (Signed)
She is concerned that the drugs have not yet arrived. I called the pharmacy to check as we refilled them 12/06/10. The checked & it did not get processed until yesterday. It should be at her home today. I had told pt that if she did not hear back from me, everything was ok & her meds would arrive today or tomorrow

## 2010-12-21 ENCOUNTER — Telehealth: Payer: Self-pay | Admitting: *Deleted

## 2010-12-21 NOTE — Telephone Encounter (Signed)
Pt left message.  Wanting to know about the upcoming elections and needing a paper signed for a physical.  RN returned his call.  Left message that this MD office does not discuss "elections" with patients.  If the pt needs information about the "new" Affordable Care Act he can Google this on a computer.  Also, if he is needing a form completed for a physical, he will need to mail or drop off the form for Dr. [name] to complete.  The [employer] took the message and sent it to the pt.

## 2010-12-27 ENCOUNTER — Telehealth: Payer: Self-pay | Admitting: *Deleted

## 2010-12-27 NOTE — Telephone Encounter (Signed)
(  thru an interpretor) he c/o a cough & start of a cold. Wants Langley Gauss to call him

## 2010-12-29 ENCOUNTER — Telehealth: Payer: Self-pay | Admitting: *Deleted

## 2010-12-29 NOTE — Telephone Encounter (Signed)
He called thru an interpretor asking to speak to [place] only. I gave her the message

## 2010-12-30 NOTE — Telephone Encounter (Signed)
RN returned pt call.  Message left through deaf service translator.  RN advised OTC Tylenol for fever or sore throat per the package instructions.  If cough or nasal symptoms take OTC Mucinex per the package instructions.  If having allergic type symptoms of runny nose take OTC Claritin per the package instructions.  If fever persists over 100.5 for greater than 24 hours or symptoms worsen please go to an Urgent [facility] for evaluation.

## 2011-01-02 ENCOUNTER — Telehealth: Payer: Self-pay | Admitting: *Deleted

## 2011-01-02 NOTE — Telephone Encounter (Signed)
Patient called to let us know that he had an allergic reaction to a cough drop. He described this reaction as his throat closing and he had SOB. Advised him not to use cough drops anymore and that I would put the information in his chart. He also advised he is using [employer] for his cough and it is working. Told him to make sure he stays hydrated and we would see him at his next visit.

## 2011-01-11 ENCOUNTER — Emergency Department (HOSPITAL_COMMUNITY)
Admission: EM | Admit: 2011-01-11 | Discharge: 2011-01-12 | Disposition: A | Discharge status: Home / Self Care | Payer: Medicare Other | Attending: Emergency Medicine | Admitting: Emergency Medicine

## 2011-01-11 ENCOUNTER — Encounter (HOSPITAL_COMMUNITY): Payer: Self-pay | Admitting: *Deleted

## 2011-01-11 ENCOUNTER — Emergency Department (HOSPITAL_COMMUNITY): Payer: Medicare Other

## 2011-01-11 ENCOUNTER — Telehealth: Payer: Self-pay | Admitting: *Deleted

## 2011-01-11 DIAGNOSIS — H9190 Unspecified hearing loss, unspecified ear: Secondary | ICD-10-CM | POA: Insufficient documentation

## 2011-01-11 DIAGNOSIS — R059 Cough, unspecified: Secondary | ICD-10-CM | POA: Insufficient documentation

## 2011-01-11 DIAGNOSIS — J3489 Other specified disorders of nose and nasal sinuses: Secondary | ICD-10-CM | POA: Insufficient documentation

## 2011-01-11 DIAGNOSIS — R05 Cough: Secondary | ICD-10-CM | POA: Insufficient documentation

## 2011-01-11 DIAGNOSIS — J189 Pneumonia, unspecified organism: Secondary | ICD-10-CM

## 2011-01-11 HISTORY — DX: Unspecified hearing loss, unspecified ear: H91.90

## 2011-01-11 MED ORDER — BENZONATATE 100 MG PO CAPS: 100.0000 mg | ORAL_CAPSULE | Freq: Three times a day (TID) | ORAL | Status: AC

## 2011-01-11 MED ORDER — AZITHROMYCIN 250 MG PO TABS
500.0000 mg | ORAL_TABLET | Freq: Once | ORAL | Status: AC
  Administered 2011-01-12: 500 mg via ORAL
  Filled 2011-01-11: qty 2

## 2011-01-11 MED ORDER — ALBUTEROL SULFATE HFA 108 (90 BASE) MCG/ACT IN AERS
2.0000 | INHALATION_SPRAY | Freq: Once | RESPIRATORY_TRACT | Status: AC
  Administered 2011-01-11: 2 via RESPIRATORY_TRACT
  Filled 2011-01-11: qty 6.7

## 2011-01-11 MED ORDER — AZITHROMYCIN 250 MG PO TABS: 250.0000 mg | ORAL_TABLET | Freq: Every day | ORAL | Status: AC

## 2011-01-11 NOTE — Telephone Encounter (Signed)
States he has been sick for 2 weeks with fever & chills, sore throat & cough that keeps him up at night. States it is hard to breathe, especially at night. Has tried OTC without relief. Wanted to be seen today. I checked with front & we are unable to see him. Pt does not want to wait until tomorrow. States he is going to ED. Told him I agreed based on his reported symptoms.  His # is [phone]

## 2011-01-11 NOTE — ED Notes (Signed)
Pt is deaf, have called for sign language interpretor which should be here around 8pm. Reports having cough, no relief with otc meds, airway intact, resp e/u at triage.

## 2011-01-11 NOTE — ED Notes (Signed)
Pt reports productive cough x2 weeks w/ sputum red in color - pt admits to intermittent fever, occasional night sweats s/p cough spells and restlessness, pt denies any recent hx of unintentional wt loss. Pt also c/o of sore throat as well x2 weeks. Pt in no acute distress at present.

## 2011-01-12 ENCOUNTER — Telehealth: Payer: Self-pay | Admitting: *Deleted

## 2011-01-12 NOTE — Telephone Encounter (Signed)
He wants to speak with Langley Gauss, RN only. [phone]. States he did go to ED & was put on meds. To have f/u appt asap, wants before thanksgiving.

## 2011-01-12 NOTE — ED Provider Notes (Signed)
History     CSN: [id number] Arrival date & time: 01/11/2011  7:01 PM   First MD Initiated Contact with Patient 01/11/11 2107      Chief Complaint  Patient presents with  . Cough    (Consider location/radiation/quality/duration/timing/severity/associated sxs/prior treatment) Patient is a 30 y.o. male presenting with cough. The history is provided by the patient. A language interpreter was used.  Cough This is a new problem. The current episode started more than 2 days ago (for the past week). The problem occurs constantly. The problem has not changed since onset.The cough is productive of sputum. There has been no fever. Associated symptoms include rhinorrhea. Pertinent negatives include no chest pain, no chills, no sweats, no ear pain, no headaches, no sore throat, no myalgias, no shortness of breath and no wheezing. He has tried decongestants for the symptoms. The treatment provided no relief.    Past Medical History  Diagnosis Date  . Deaf     History reviewed. No pertinent past surgical history.  History reviewed. No pertinent family history.  History  Substance Use Topics  . Smoking status: Never Smoker   . Smokeless tobacco: Never Used  . Alcohol Use: No      Review of Systems  Constitutional: Negative for fever, chills, activity change and appetite change.  HENT: Positive for congestion and rhinorrhea. Negative for ear pain, sore throat, neck pain and neck stiffness.   Respiratory: Positive for cough. Negative for chest tightness, shortness of breath and wheezing.   Cardiovascular: Negative for chest pain and palpitations.  Gastrointestinal: Negative for nausea, vomiting and abdominal pain.  Genitourinary: Negative for dysuria, urgency, frequency and flank pain.  Musculoskeletal: Negative for myalgias.  Neurological: Negative for dizziness, weakness, light-headedness and headaches.  All other systems reviewed and are negative.    Allergies  Cough  drops  Home Medications   Current Outpatient Rx  Name Route Sig Dispense Refill  . DARUNAVIR ETHANOLATE 400 MG PO TABS Oral Take 2 tablets (800 mg total) by mouth 1 day or 1 dose. 60 tablet 11  . EMTRICITABINE-TENOFOVIR 200-300 MG PO TABS Oral Take 1 tablet by mouth daily. 30 tablet 11  . OMEPRAZOLE 20 MG PO CPDR Oral Take 1 capsule (20 mg total) by mouth daily. 30 capsule 11  . RITONAVIR 100 MG PO CAPS Oral Take 1 capsule (100 mg total) by mouth daily. 30 capsule 11  . AZITHROMYCIN 250 MG PO TABS Oral Take 1 tablet (250 mg total) by mouth daily. Take first 2 tablets together, then 1 every day until finished. 6 tablet 0  . BENZONATATE 100 MG PO CAPS Oral Take 1 capsule (100 mg total) by mouth every 8 (eight) hours. 21 capsule 0    BP 138/94  Pulse 78  Temp(Src) 97.6 F (36.4 C) (Oral)  Resp 16  SpO2 96%  Physical Exam  Nursing note and vitals reviewed. Constitutional: He is oriented to person, place, and time. He appears well-developed and well-nourished.  HENT:  Head: Normocephalic and atraumatic.  Mouth/Throat: Oropharynx is clear and moist.  Eyes: Conjunctivae and EOM are normal. Pupils are equal, round, and reactive to light.  Neck: Normal range of motion. Neck supple.  Cardiovascular: Normal rate, regular rhythm, normal heart sounds and intact distal pulses.   Pulmonary/Chest: Effort normal and breath sounds normal. No respiratory distress.  Abdominal: Soft. Bowel sounds are normal. There is no tenderness.  Musculoskeletal: Normal range of motion. He exhibits no tenderness.  Neurological: He is alert and oriented to  person, place, and time.  Skin: Skin is warm and dry. No rash noted.    ED Course  Procedures (including critical care time)  Labs Reviewed - No data to display Dg Chest 2 View  01/11/2011  *RADIOLOGY REPORT*  Clinical Data: 30 year old male with productive cough x2 weeks. Intermittent fever.  CHEST - 2 VIEW  Comparison: 11/11/2008 and earlier.  Findings:  Stable lung volumes.  Cardiac size and mediastinal contours are within normal limits.  Visualized tracheal air column is within normal limits.  Subtle increased right lower lobe peribronchovascular opacity.  No consolidation, pleural effusion, pneumothorax, edema, or other more confluent pulmonary opacity. No acute osseous abnormality identified.  IMPRESSION: Subtle right lower lobe peribronchovascular opacity could reflect atelectasis or developing infection.  Original Report Authenticated By: Randall An, M.D.     1. Community acquired pneumonia       MDM  Breathing comfotably.  Likely viral process resulting in bronchitis however given hiv status and the development of opacity will treat with zpack.  Placed on albuterol for sx control and tessalon perles.  No need for steroids as there was no bronchoconstriction on exam.  Instructed to follow up with pcp in 1 week as needed and provided sx for which to return to ed.  Has been taking hiv meds as directed.  Instructed to stop otc sx control meds        Trisha Mangle, MD 01/12/11 [id number]

## 2011-01-12 NOTE — Telephone Encounter (Signed)
Langley Gauss asked me to call him back. She is unable to call him today. I called & left a message that Langley Gauss would not be calling & if he needs an appt to call the main number & press option1 to make an appt

## 2011-01-12 NOTE — ED Notes (Signed)
Via sign language translator pt d/c instructions and rx reviewed w/ pt, pt advised to complete all antibiotics. Pt denies any further questions or concerns at present.

## 2011-01-23 ENCOUNTER — Telehealth: Payer: Self-pay | Admitting: *Deleted

## 2011-01-23 NOTE — Telephone Encounter (Signed)
[facility] ED visit.  Started on Azithromycin.  Appt made with ENT MD for 01/30/11.  Pt reports being told he had pneumonia.  Pt wondering whether he should f/u at [facility].  Appointment made for ED f/u on 01/26/11 @ 1515 w/ Dr. Linus Salmons to follow-up "pneumonia."  RN requested the Appointment Scheduler to obtain sign language interpreter for this visit.

## 2011-01-26 ENCOUNTER — Ambulatory Visit: Payer: Medicare Other | Admitting: Internal Medicine

## 2011-01-26 ENCOUNTER — Encounter: Payer: Self-pay | Admitting: Internal Medicine

## 2011-01-26 ENCOUNTER — Ambulatory Visit (INDEPENDENT_AMBULATORY_CARE_PROVIDER_SITE_OTHER): Payer: Medicare Other | Admitting: Internal Medicine

## 2011-01-26 DIAGNOSIS — J40 Bronchitis, not specified as acute or chronic: Secondary | ICD-10-CM

## 2011-01-26 DIAGNOSIS — Z23 Encounter for immunization: Secondary | ICD-10-CM

## 2011-01-26 DIAGNOSIS — B20 Human immunodeficiency virus [HIV] disease: Secondary | ICD-10-CM

## 2011-01-26 DIAGNOSIS — K219 Gastro-esophageal reflux disease without esophagitis: Secondary | ICD-10-CM

## 2011-01-26 HISTORY — DX: Bronchitis, not specified as acute or chronic: J40

## 2011-01-26 MED ORDER — BENZONATATE 100 MG PO CAPS: 100.0000 mg | ORAL_CAPSULE | Freq: Four times a day (QID) | ORAL | Status: DC | PRN

## 2011-01-26 NOTE — Assessment & Plan Note (Signed)
His HIV infection, based on lab work in July, has come under much better control over the past year. I will continue his current regimen and repeat his blood work today. He will get his influenza vaccine today.

## 2011-01-26 NOTE — Assessment & Plan Note (Signed)
We will keep him off of his omeprazole for now.

## 2011-01-26 NOTE — Assessment & Plan Note (Signed)
He has some residual cough related to his recent bronchitis. I instructed him to try the [employer]

## 2011-01-26 NOTE — Progress Notes (Signed)
  Subjective:    Patient ID: Matthew Fitzpatrick, male    DOB: Mar 23, 1980, 30 y.o.   MRN: [id number]  HPI [name] is in for an emergency department followup visit. He developed cough and mild shortness of breath about 2 and half weeks ago and was seen in the emergency department. His checks x-ray was read as possible pneumonia which I think was over cold. He was treated with azithromycin and was given a prescription for [employer] He completed the azithromycin but did not take the [employer] He is feeling better but still has some persistent dry cough. He has not had any fever. He denies missing any of his HIV medications. He has not been taking his omeprazole because he does not have any further problems with acid indigestion. He has not had any sinus congestion.    Review of Systems     Objective:   Physical Exam  Constitutional: He appears well-developed and well-nourished. No distress.  HENT:  Mouth/Throat: Oropharynx is clear and moist. No oropharyngeal exudate.  Eyes: Conjunctivae are normal.  Cardiovascular: Normal rate, regular rhythm and normal heart sounds.   No murmur heard. Pulmonary/Chest: Breath sounds normal. He has no wheezes. He has no rales.  Skin: No rash noted.  Psychiatric: He has a normal mood and affect.   HIV 1 RNA Quant (copies/mL)  Date Value  08/30/2010 101*  12/30/2009 226*  11/15/2009 584000*     CD4 T Cell Abs (cmm)  Date Value  08/30/2010 430   12/30/2009 210*  11/15/2009 90*            Assessment & Plan:

## 2011-01-30 LAB — HIV-1 RNA QUANT-NO REFLEX-BLD: HIV 1 RNA Quant: <20 copies/mL (ref <20)

## 2011-01-31 ENCOUNTER — Telehealth: Payer: Self-pay | Admitting: *Deleted

## 2011-01-31 NOTE — Telephone Encounter (Signed)
RN praised pt for taking his medications.  CD4 and VL lab values much improved.  Pt verbalized understanding through video-relay operator.

## 2011-01-31 NOTE — Telephone Encounter (Signed)
He went to another md -[facility] Dr. Jenell Milliner & got a rx that he cannot afford. States he does not have medicare part D. He was unsure of the name of the med. I suggested that he call that md & ask for samples or for another rx that is less costly. He agreed with plan

## 2011-02-01 ENCOUNTER — Telehealth: Payer: Self-pay | Admitting: *Deleted

## 2011-02-02 ENCOUNTER — Telehealth: Payer: Self-pay | Admitting: *Deleted

## 2011-02-02 NOTE — Telephone Encounter (Signed)
He is c/o a cough when he is around cigarette smoke. Thinks he is allergic to second hand smoke. Wants to know if there is something he can take. Told him to ask others to smoke outside. He wants a sign asking people to not smoke . Told him he could write one that says no smoking or buy one at a place like [employer].  He wants Langley Gauss to call him back [phone]

## 2011-02-06 NOTE — Telephone Encounter (Signed)
Returning pt's call.

## 2011-02-10 ENCOUNTER — Telehealth: Payer: Self-pay | Admitting: *Deleted

## 2011-02-10 NOTE — Telephone Encounter (Signed)
He has an appt in January & wants to make sure we arrange an interpretor. Given to front desk

## 2011-02-27 ENCOUNTER — Other Ambulatory Visit: Payer: Medicare Other

## 2011-03-01 IMAGING — CR DG CHEST 2V
2 series · 2 of 2 positions shown · non-contrast
Comparison: None

CLINICAL DATA: Chest pain.  HIV positivity.

CHEST - 2 VIEW

[w chest pa]
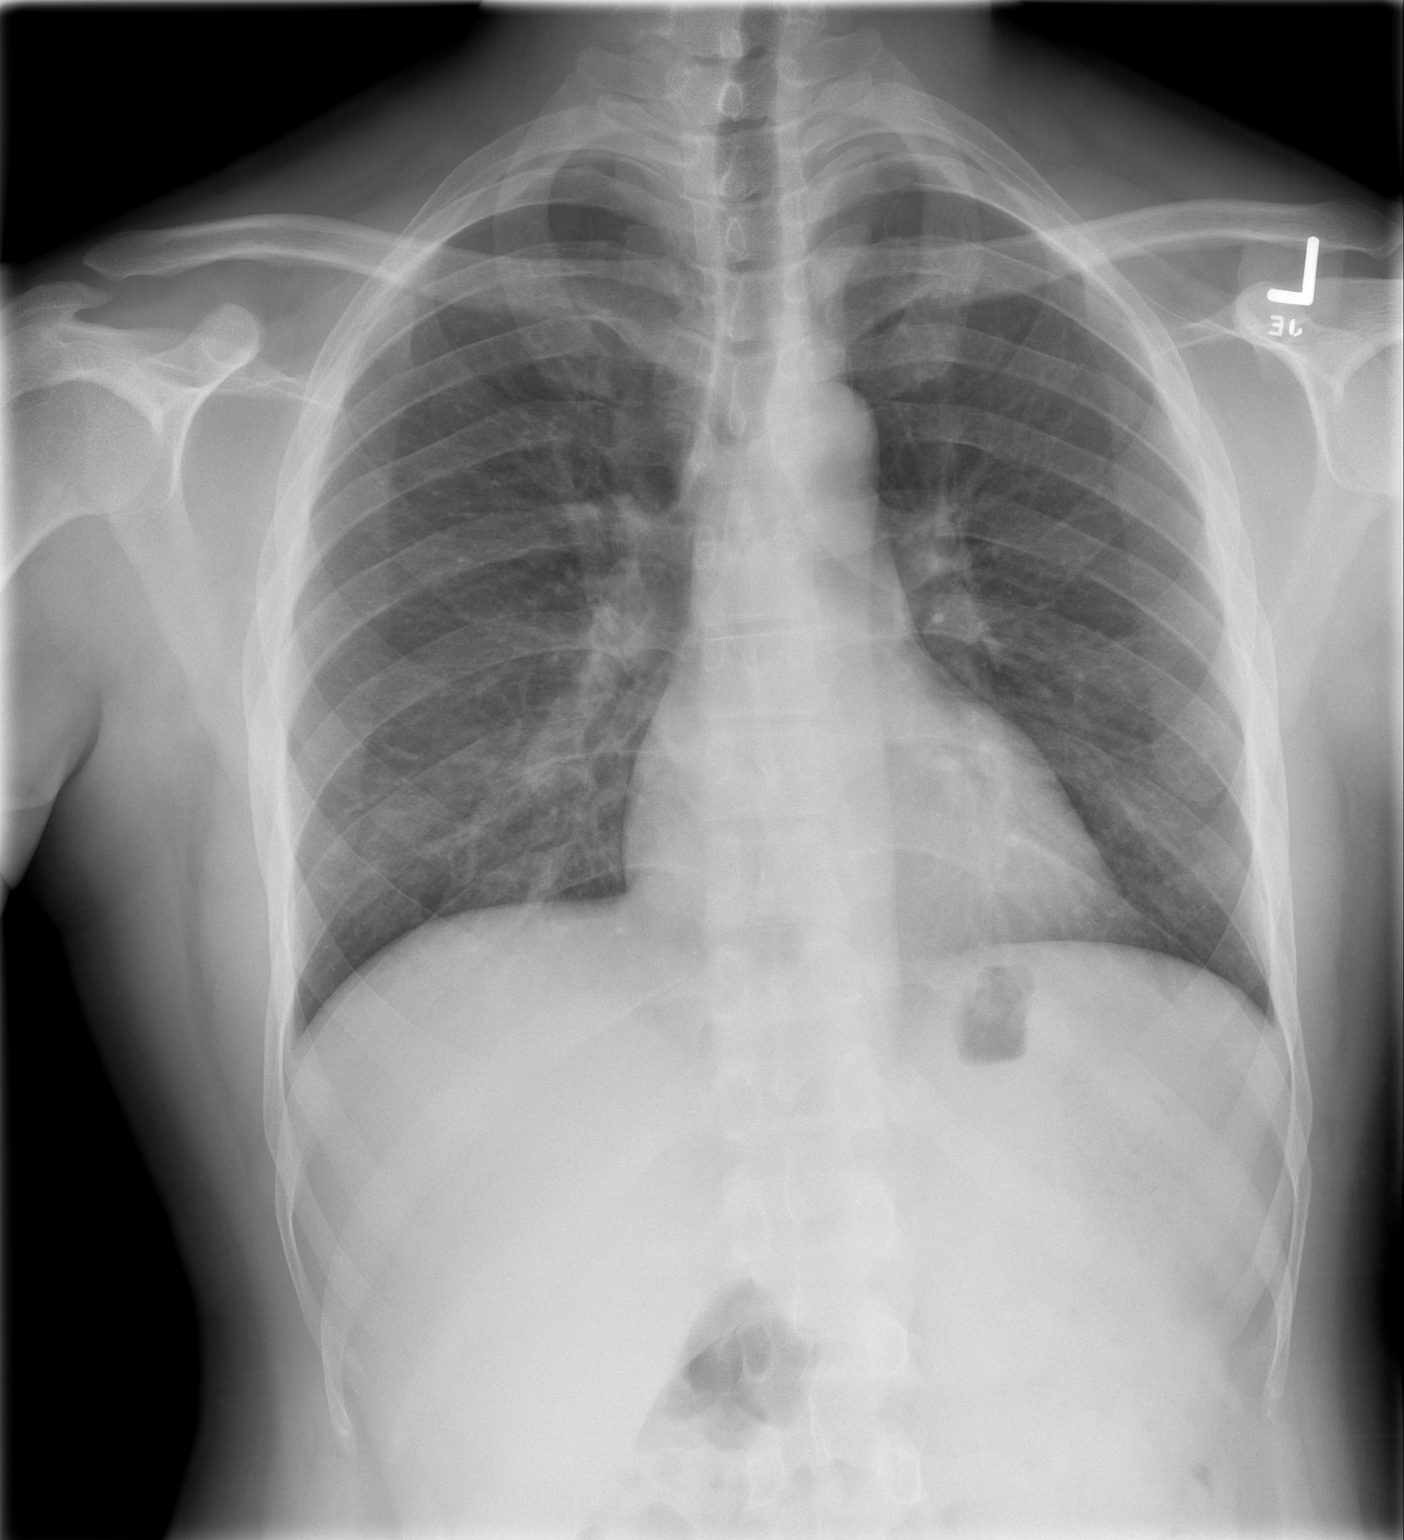

[w chest lat]
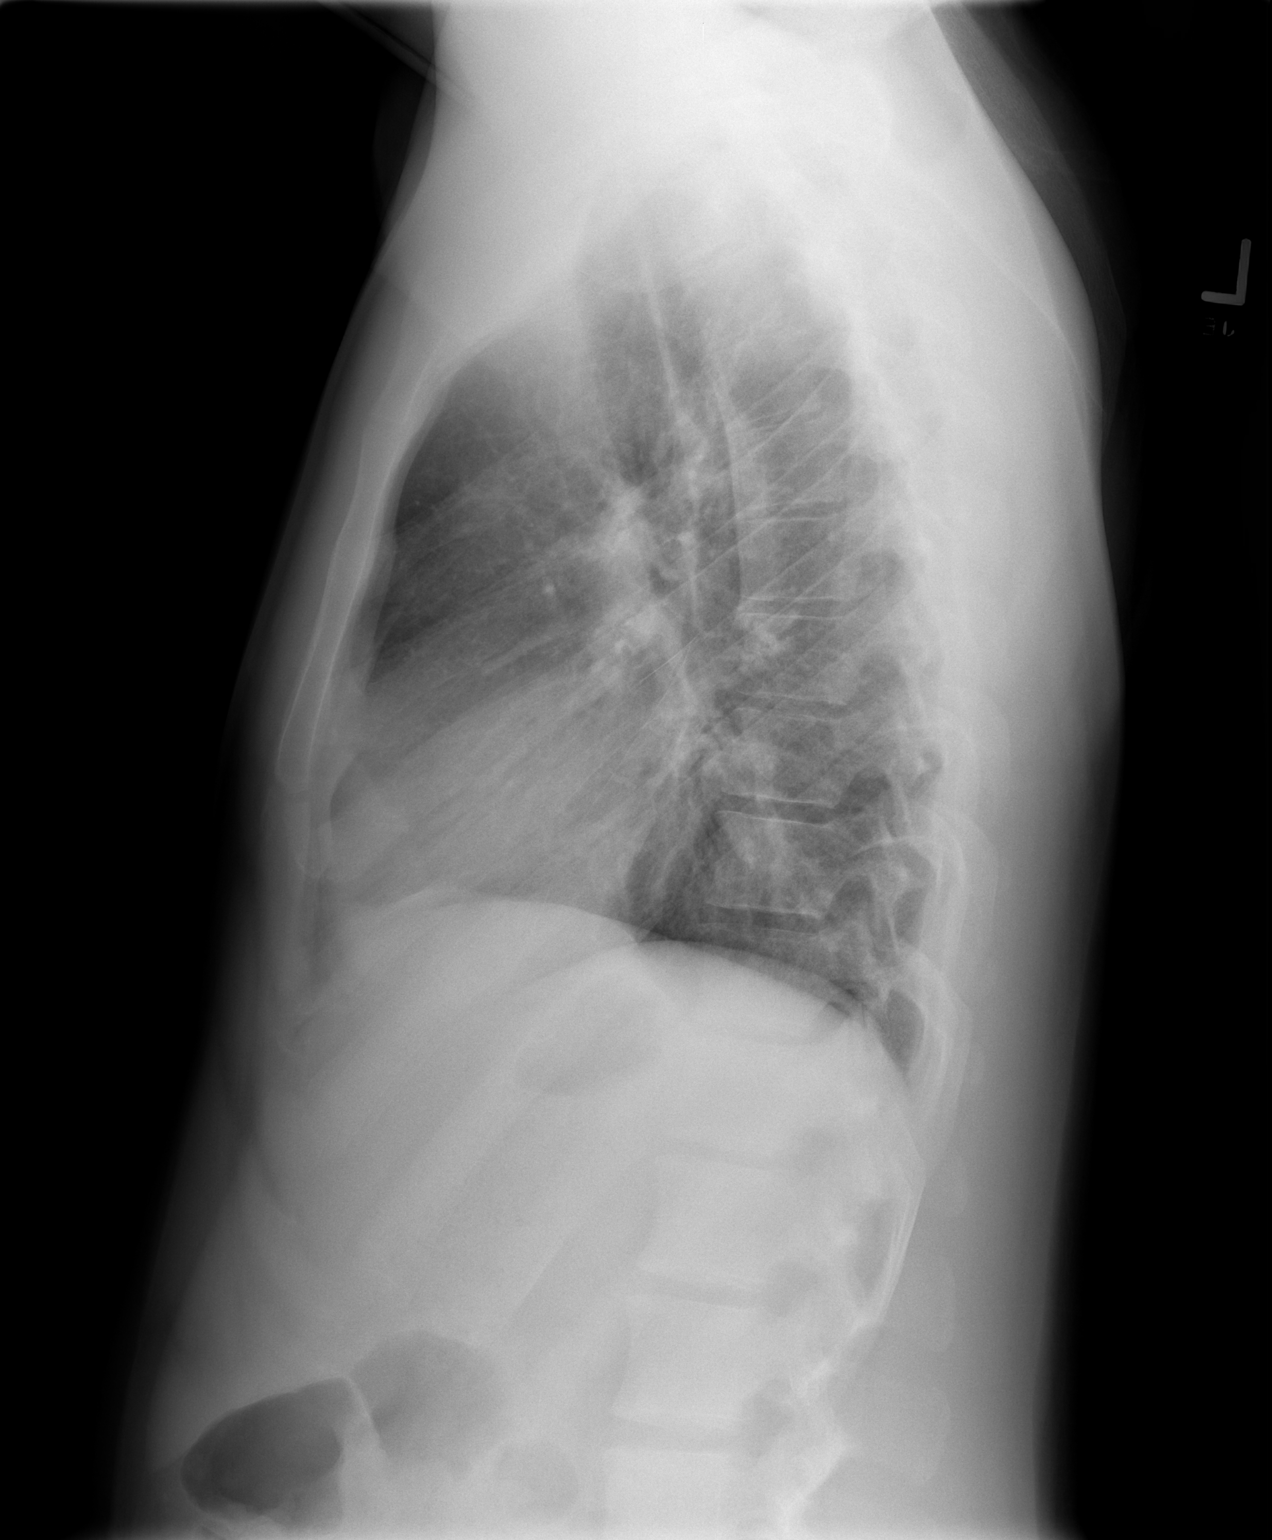

[2 of 2 positions shown; findings below may reference images not displayed]

FINDINGS: Heart size is normal.

There is no pleural effusion or pulmonary edema.

Increased opacity within the right base is noted which is
suspicious for early infiltrate.  The remaining portions lungs are
clear.
IMPRESSION: 1.  Suspect early infiltrate to the right lung base.

## 2011-03-07 ENCOUNTER — Encounter: Payer: Self-pay | Admitting: Internal Medicine

## 2011-03-07 ENCOUNTER — Ambulatory Visit: Payer: Medicare Other

## 2011-03-07 ENCOUNTER — Ambulatory Visit (INDEPENDENT_AMBULATORY_CARE_PROVIDER_SITE_OTHER): Payer: Medicare Other | Admitting: Internal Medicine

## 2011-03-07 ENCOUNTER — Telehealth: Payer: Self-pay

## 2011-03-07 VITALS — BP 132/85 | HR 69 | Temp 98.2°F | Ht 68.0 in | Wt 203.2 lb

## 2011-03-07 DIAGNOSIS — B20 Human immunodeficiency virus [HIV] disease: Secondary | ICD-10-CM

## 2011-03-07 DIAGNOSIS — Z79899 Other long term (current) drug therapy: Secondary | ICD-10-CM

## 2011-03-07 DIAGNOSIS — Z113 Encounter for screening for infections with a predominantly sexual mode of transmission: Secondary | ICD-10-CM | POA: Diagnosis not present
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
## 2011-03-07 NOTE — Telephone Encounter (Signed)
Patient was in for SPAP recertification and was told to bring his 2013 SSI letter with him for his appt. He stated he left it at home. I gave him the fax # to fax it to as I need it to process his application - also left message on his phone that I wanted to mail the application today. Will wait for patient response, otherwise, will not be able to send his application.

## 2011-03-09 ENCOUNTER — Telehealth: Payer: Self-pay

## 2011-03-09 NOTE — Telephone Encounter (Signed)
Patient brought in 2013 ssi letter needed for adap - sent application today.

## 2011-03-13 ENCOUNTER — Telehealth: Payer: Self-pay

## 2011-03-13 NOTE — Telephone Encounter (Signed)
Called patient to advise him to apply for Medicare Part D per ADAP, needs to have by May 28, 2011 - left phone # for [facility] office and [name] at [employer] to call them or me back if questions.

## 2011-03-13 NOTE — Telephone Encounter (Signed)
Called patient to advise him to apply for Medicare Part D per ADAP - left [facility] number for him to call them to apply and to call me or [name] at [employer] with questions, and left numbers.

## 2011-03-15 ENCOUNTER — Telehealth: Payer: Self-pay

## 2011-03-15 ENCOUNTER — Telehealth: Payer: Self-pay | Admitting: *Deleted

## 2011-03-15 NOTE — Telephone Encounter (Signed)
Patient called the front desk for me concerning the Medicare part D again - at time was working with another patient and could not come to phone - called him back and the interpreter could not connect [place] - told her to let him know I called back. I gave him the number to call the [facility] office to enroll in Part D as is ADAP requirement and he needs to do so by 05/28/11 - he said he did and they told him he had to pay 20.00 a month - advised that was true. He called back again and said that was fine and he was all set.

## 2011-03-15 NOTE — Telephone Encounter (Signed)
I am planning to stop my medications this summer because I'm so depressed about not getting any help from the police.  RN advised pt to call his [facility] Case Manager to discuss this issue.  Also, advised pt not to stop his medications.  When he feels depressed he needs to call his [facility] Case Manager and ask for help.  Pt stated that he would call his [facility] Case Manager.  RN supported the pt in making better choices when he has questions and is feeling depressed.  [facility] is located in [facility] and is close to go to for support.  Pt transferred to B. Phylliss Bob for questions about Medicare Part D.

## 2011-03-16 ENCOUNTER — Telehealth: Payer: Self-pay

## 2011-03-16 NOTE — Telephone Encounter (Signed)
Patient called and talked to Matthew Fitzpatrick - he refuses to get the Medicare part D that ADAP requires for patients with Medicare as he doesn't want to pay the 20.00 a month copay for it.  - he will not be eligible any longer for ADAP after 05/28/11, if he doesn't get it.  I called him and relayed that message and also called [name] at [employer] to get in touch with him about this - also left phone number for him to call her as well. He has called several times about this and calls throughout the clinic if I don't catch the phone right away, which is not always possible. There is nothing more I can do at this point.

## 2011-03-20 ENCOUNTER — Telehealth: Payer: Self-pay

## 2011-03-20 ENCOUNTER — Telehealth: Payer: Self-pay | Admitting: *Deleted

## 2011-03-20 NOTE — Telephone Encounter (Signed)
Patient called again today about the Medicare Part D - explained again that it was an ADAP requirement to stay on the program for Medicare patients to enroll in Part D - he said it was 29.00 a month - advised him to call [name] at [employer] as she could probably help and explain better than I can - she is the SPAP Government social research officer and was the one who gave [place] the list of patients to apply for Part D. If they don't, they will be taken off the program by 05/28/11. Jiles Garter said a letter would also go out to the patients as well. She offered to talk to them if case managers were having difficulty contacting or getting them to enroll.

## 2011-03-20 NOTE — Telephone Encounter (Signed)
Pt questioning why he needs Medicare Part D.  RN explained that new "rules" starting in 2013 require that in order to receive ADAP assistance with medications that Medicare recipients must have a Medicare Part D plan in place.  Otherwise, there will be no further ADAP assistance after 05/28/11.  RN repeated this 3 times for the pt.  RN emphasized that it is less expensive to pay the Part D premium than it is to pay for his HIV medications out-of-pocket or to get sick from not taking any of his HIV medications.  Pt said "OK" at the end of the conversation.

## 2011-03-22 ENCOUNTER — Telehealth: Payer: Self-pay | Admitting: *Deleted

## 2011-03-22 NOTE — Telephone Encounter (Signed)
He is signing up for Part D Medicare.  In order to save money he is putting off his next lab and MD visit until the summer.  RN advised the pt that it was very important that he continue to take his medications.  Pt responded that he would.

## 2011-04-06 ENCOUNTER — Other Ambulatory Visit: Payer: Self-pay | Admitting: *Deleted

## 2011-04-06 DIAGNOSIS — B20 Human immunodeficiency virus [HIV] disease: Secondary | ICD-10-CM

## 2011-04-06 DIAGNOSIS — K219 Gastro-esophageal reflux disease without esophagitis: Secondary | ICD-10-CM

## 2011-04-06 MED ORDER — OMEPRAZOLE 20 MG PO CPDR: 20.0000 mg | DELAYED_RELEASE_CAPSULE | Freq: Every day | ORAL | Status: DC

## 2011-04-06 MED ORDER — RITONAVIR 100 MG PO TABS: 100.0000 mg | ORAL_TABLET | Freq: Every day | ORAL | Status: DC

## 2011-04-06 MED ORDER — EMTRICITABINE-TENOFOVIR DF 200-300 MG PO TABS: 1.0000 | ORAL_TABLET | Freq: Every day | ORAL | Status: DC

## 2011-04-06 MED ORDER — DARUNAVIR ETHANOLATE 800 MG PO TABS: 800.0000 mg | ORAL_TABLET | Freq: Every day | ORAL | Status: DC

## 2011-04-06 NOTE — Telephone Encounter (Signed)
He called to make sure his meds have been filled as he will be out next Tuesday. I told him that has been done. He said something about having to call an insurance & the number was d/c. I checked with Michel Harrow, RN. She instructed him to call Chrisandra Carota at [facility] in [facility].

## 2011-04-07 ENCOUNTER — Telehealth: Payer: Self-pay

## 2011-04-07 ENCOUNTER — Telehealth: Payer: Self-pay | Admitting: *Deleted

## 2011-04-07 NOTE — Telephone Encounter (Signed)
Patient called again regarding part D Medicare - that he will not get it and doesn't want to pay for it - he has been told by Hiram Comber, [name] at  [employer] and Johna Roles that it is a requirement for him to stay on the program or he will be dropped as of 05/28/11 - he has a case manager at [facility] - called Amy Faw and left message for someone there to call him to reiterate once again that he needs to enroll in order to keep receiving medications.

## 2011-04-07 NOTE — Telephone Encounter (Signed)
He called to say he cannot get part D medicare. I told him his ADAP will expire the end of March this year. I had instructed him yesterday to call his [facility] person in [facility] yesterday. He states he will call her now. She is the one to help him thru this & ensure some coverage for his meds.

## 2011-04-10 ENCOUNTER — Telehealth: Payer: Self-pay | Admitting: *Deleted

## 2011-04-10 NOTE — Telephone Encounter (Signed)
He states he got a call from a person at a pharmacy near [facility]. He does not know the name of the pharmacy. He is concerned because they were asking personal questions. I told him if he does not go there, do not share info. He agreed wit this

## 2011-04-11 ENCOUNTER — Telehealth: Payer: Self-pay | Admitting: *Deleted

## 2011-04-11 NOTE — Telephone Encounter (Signed)
The [facility] rep for this pt called to find out what the problem was. I told her he runs out of ADAP 05/28/11. He has medicare & must get part D before then or he will have no source of meds. She wanted to talk with Irish Elders. I transferred her but the line was busy. Ivin Booty states she will call her later.

## 2011-04-12 ENCOUNTER — Telehealth: Payer: Self-pay

## 2011-04-12 NOTE — Telephone Encounter (Signed)
Spoke with Ivin Booty at [facility] - she spent over 30 minutes talking to patient about importance of getting the Part D even if he has to pay 29.00 a month, due to the fact ADAP will drop him from the program 05/28/11. He said he would, then called Langley Gauss today about "legal issues"??! - asked Ivin Booty and she wasn't sure what he meant either.

## 2011-05-02 ENCOUNTER — Telehealth: Payer: Self-pay | Admitting: *Deleted

## 2011-05-02 NOTE — Telephone Encounter (Signed)
The interpretor states pt has had a cough x 2 days & his shoulder hurts. He has taken OTC w/o relief. I advised he should be seen. Transferred to [place] at [place] to make an appt

## 2011-05-08 ENCOUNTER — Telehealth: Payer: Self-pay

## 2011-05-08 NOTE — Telephone Encounter (Signed)
Received copy of letter from [employer] that went to patient advising him to enroll in Medicare Part D or he would be dropped from program as of March 31st.  Numerous attempts (see other telephone call entries in chart review)  were made to tell patient that he needed to enroll from Craig Guess at [place] and me. He refuses to pay the 29.00 per month for the Part D and that is the issue. Since receiving the letter, contacted Ivin Booty at [facility] today and she said she would contact his mother to see if she could talk to him.

## 2011-05-23 ENCOUNTER — Telehealth: Payer: Self-pay

## 2011-05-23 NOTE — Telephone Encounter (Signed)
Merrilee Seashore from [employer] called - asked about patient's part D - advised he would not get it due to having to pay the copay - several people have tried to talk to him, letting him know he would be dropped from ADAP as of March 31st. - see previous telephone notes. He said he would try and call him and to see if there were other plans less than the 29.00 a month. Advised him to let me know the results of his conversation.

## 2011-05-24 ENCOUNTER — Telehealth: Payer: Self-pay

## 2011-05-24 NOTE — Telephone Encounter (Signed)
Matthew Fitzpatrick from the [employer]AP office called yesterday regarding patient's Medicare Part D. I explained the situation to him that the patient refuses to pay the 29.00 a month charge. Merrilee Seashore said he thought there were cheaper plans and offered to call him. He called me back and said the patient refused "to pay anything". Merrilee Seashore reiterated that he would be taken off the ADAP program. Unfortunately, as of March 31st, he will, unless he changes his mind.  I wasn't sure if there were any patient assistance programs that would help, but thought I would ask -

## 2011-06-01 ENCOUNTER — Telehealth: Payer: Self-pay

## 2011-06-01 NOTE — Telephone Encounter (Signed)
Received call from [employer] office that patient finally enrolled in  the Part D Medicare - Nick from [employer] office was able to work it out with him - good news!

## 2011-06-01 NOTE — Telephone Encounter (Signed)
I will pass this on to Dr. [name].

## 2011-06-01 NOTE — Telephone Encounter (Signed)
Thank you, Pamala Hurry!

## 2011-06-02 ENCOUNTER — Other Ambulatory Visit: Payer: Self-pay | Admitting: *Deleted

## 2011-06-02 DIAGNOSIS — Z113 Encounter for screening for infections with a predominantly sexual mode of transmission: Secondary | ICD-10-CM

## 2011-06-05 ENCOUNTER — Other Ambulatory Visit: Payer: Medicare Other

## 2011-06-19 ENCOUNTER — Ambulatory Visit: Payer: Medicare Other | Admitting: Internal Medicine

## 2011-07-27 ENCOUNTER — Telehealth: Payer: Self-pay | Admitting: *Deleted

## 2011-07-27 NOTE — Telephone Encounter (Signed)
Left message.  Pt has Lab Work Catering manager., June 4 @ 0930.  Reminded him of this appt and suggested that he talk with someone at that time about his need for "a letter."  Reminded pt of MD appt for Tues., June 18 at 0930 as well.  Message left through the [place] service.

## 2011-07-28 ENCOUNTER — Telehealth: Payer: Self-pay | Admitting: *Deleted

## 2011-07-28 NOTE — Telephone Encounter (Signed)
Unable to speak directly to pt through Engineer, mining.  Left message for pt to ask to speak with a staff member when he comes for Lab work Tues., August 01, 2011.

## 2011-07-28 NOTE — Telephone Encounter (Signed)
TTY operator assisted the pt.  He had received some paperwork that he does not understand regarding "Medicare."  RN advised pt to bring the paperwork with him when he comes to his Lab Work appt 08/01/11 and B. Phylliss Bob agreed to talk with him then.  Pt verbalized understanding and will bring the paperwork.

## 2011-08-01 ENCOUNTER — Other Ambulatory Visit (HOSPITAL_COMMUNITY)
Admission: RE | Admit: 2011-08-01 | Discharge: 2011-08-01 | Disposition: A | Discharge status: Home / Self Care | Payer: Medicare Other | Source: Ambulatory Visit | Attending: Internal Medicine | Admitting: Internal Medicine

## 2011-08-01 ENCOUNTER — Other Ambulatory Visit (HOSPITAL_COMMUNITY): Admission: RE | Admit: 2011-08-01 | Payer: Medicare Other | Source: Ambulatory Visit | Admitting: Internal Medicine

## 2011-08-01 ENCOUNTER — Other Ambulatory Visit: Payer: Medicare Other

## 2011-08-01 DIAGNOSIS — B20 Human immunodeficiency virus [HIV] disease: Secondary | ICD-10-CM | POA: Diagnosis not present

## 2011-08-01 DIAGNOSIS — Z79899 Other long term (current) drug therapy: Secondary | ICD-10-CM | POA: Diagnosis not present

## 2011-08-01 DIAGNOSIS — Z113 Encounter for screening for infections with a predominantly sexual mode of transmission: Secondary | ICD-10-CM | POA: Insufficient documentation

## 2011-08-01 LAB — CBC
Hemoglobin: 15.4 g/dL (ref 13.0–17.0)
MCH: 31 pg (ref 26.0–34.0)
MCV: 86.7 fL (ref 78.0–100.0)
RBC: 4.96 MIL/uL (ref 4.22–5.81)
WBC: 5.6 10*3/uL (ref 4.0–10.5)

## 2011-08-01 LAB — LIPID PANEL
HDL: 31 mg/dL — ABNORMAL LOW (ref >39)
LDL Cholesterol: 114 mg/dL — ABNORMAL HIGH (ref 0–99)
Total CHOL/HDL Ratio: 6.5 Ratio
Triglycerides: 283 mg/dL — ABNORMAL HIGH (ref <150)
VLDL: 57 mg/dL — ABNORMAL HIGH (ref 0–40)

## 2011-08-01 LAB — COMPLETE METABOLIC PANEL WITH GFR
ALT: 13 U/L (ref 0–53)
AST: 16 U/L (ref 0–37)
Alkaline Phosphatase: 143 U/L — ABNORMAL HIGH (ref 39–117)
Calcium: 9.5 mg/dL (ref 8.4–10.5)
Chloride: 103 mEq/L (ref 96–112)
Creat: 0.95 mg/dL (ref 0.50–1.35)
Total Bilirubin: 0.4 mg/dL (ref 0.3–1.2)

## 2011-08-04 ENCOUNTER — Telehealth: Payer: Self-pay

## 2011-08-04 NOTE — Telephone Encounter (Signed)
Patient walked-in and had SSI papers with him concerning his amt and Medicare part D deductions - explained as best I could without deaf interpreter - advised him to call [phone] on SSI paperwork for deaf and hard of hearing as they can explain better than I can. Let Langley Gauss know as well that he came by.

## 2011-08-14 ENCOUNTER — Telehealth: Payer: Self-pay | Admitting: *Deleted

## 2011-08-14 NOTE — Telephone Encounter (Signed)
He had several questions about a bill he received. I asked him to call the customer service number on the bill & ask them to explain. Stated he will. Used an Veterinary surgeon for this call

## 2011-08-15 ENCOUNTER — Ambulatory Visit (INDEPENDENT_AMBULATORY_CARE_PROVIDER_SITE_OTHER): Payer: Medicare Other | Admitting: Internal Medicine

## 2011-08-15 ENCOUNTER — Encounter: Payer: Self-pay | Admitting: Internal Medicine

## 2011-08-15 VITALS — BP 119/75 | HR 66 | Temp 98.3°F | Ht 68.0 in | Wt 202.0 lb

## 2011-08-15 DIAGNOSIS — B20 Human immunodeficiency virus [HIV] disease: Secondary | ICD-10-CM

## 2011-08-15 NOTE — Progress Notes (Signed)
Patient ID: Matthew Fitzpatrick, male   DOB: October 04, 1980, 31 y.o.   MRN: [id number]     [facility] for Infectious Disease  Patient Active Problem List  Diagnosis  . HIV DISEASE  . PEDICULOSIS PUBIS (PUBIC LOUSE)  . ANXIETY  . DEPRESSION  . DEAFNESS, CONGENITAL  . ALLERGIC RHINITIS  . PROCTITIS  . ERECTILE DYSFUNCTION, ORGANIC  . CHICKENPOX, HX OF  . GERD (gastroesophageal reflux disease)    Patient's Medications  New Prescriptions   No medications on file  Previous Medications   DARUNAVIR ETHANOLATE (PREZISTA) 800 MG TABS    Take 800 mg by mouth daily.   EMTRICITABINE-TENOFOVIR (TRUVADA) 200-300 MG PER TABLET    Take 1 tablet by mouth daily.   OMEPRAZOLE (PRILOSEC) 20 MG CAPSULE    Take 1 capsule (20 mg total) by mouth daily.   RITONAVIR (NORVIR) 100 MG TABS    Take 1 tablet (100 mg total) by mouth daily.  Modified Medications   No medications on file  Discontinued Medications   No medications on file    Subjective: Matthew Fitzpatrick is in for his routine visit. He states that he is using his pillbox and does not miss any of his medications. He denies having any problems since his last visit he has not yet gotten a primary care physician and [place] but states that he is willing to do so.  Objective: Temp: 98.3 F (36.8 C) (06/18 0924) Temp src: Oral (06/18 0924) BP: 119/75 mmHg (06/18 0924) Pulse Rate: 66  (06/18 0924)  General: He is in no distress Skin:  No rash Lungs: Clear Cor: Regular S1 and S2 no murmurs Abdomen: Obese, soft and nontender  Lab Results HIV 1 RNA Quant (copies/mL)  Date Value  08/01/2011 <20   01/26/2011 <20   08/30/2010 101*     CD4 T Cell Abs (cmm)  Date Value  08/01/2011 490   01/26/2011 620   08/30/2010 430      Assessment: His adherence is good and his HIV infection remains under excellent control. He continues to have difficulty understanding and negotiating his Medicare coverage. He will talk to his [facility] case manager  today.  Plan: 1. Continue current antiretroviral regimen 2. I have encouraged him again to get a primary care physician closer to home and [place] 3. Followup here after lab work in [place] months   Michel Bickers, MD [facility] for [place] [phone] pager   [phone] cell 08/15/2011, 9:43 AM

## 2011-09-05 ENCOUNTER — Telehealth: Payer: Self-pay

## 2011-09-08 ENCOUNTER — Telehealth: Payer: Self-pay | Admitting: *Deleted

## 2011-09-08 NOTE — Telephone Encounter (Signed)
Patient called requesting to have labs done the same day as office visit.  He can not afford to come twice.  Lab appt cancelled for 02/01/12. Myrtis Hopping

## 2011-09-08 NOTE — Telephone Encounter (Signed)
Pt calling regarding the need to have a 90 day supply of HIV medications.  He will be going on vacation out of [place] and will need refill prior to returning. He is requesting Dr [name] send a 69 supply of each medication to [facility] pharmacy.  It was explained to the patient the pharmacy controls the quantity.  I don't think SPAP will pay for 90 days. Normally only a 30 day supply can be distributed.  Pt was very upset.  I suggested he have a family member send his medications to him while he is on vacation.  This was the only solution I could offer the patient.   Laverle Patter, RN

## 2011-09-14 ENCOUNTER — Ambulatory Visit: Payer: Medicare Other

## 2011-10-16 ENCOUNTER — Ambulatory Visit: Payer: Medicare Other

## 2011-10-20 ENCOUNTER — Telehealth: Payer: Self-pay | Admitting: *Deleted

## 2011-10-20 NOTE — Telephone Encounter (Signed)
Patient called c/o not feeling well, asked if he had a PCP and he said yes, advised to call PCP, as there are no available appointments today. Myrtis Hopping CMA

## 2011-10-26 ENCOUNTER — Telehealth: Payer: Self-pay | Admitting: *Deleted

## 2011-10-26 NOTE — Telephone Encounter (Signed)
Pt given IV fluids at [facility] ED last Fri., Aug. 23, 2013.  Feeling much better now.  Still taking his HIV medications.  RN suggested that nausea and not feeling well could possibly be related to a GI bug going around.  Pt said "how did you know."  RN shared that his medications had not been causing him a problem before and they are not causing him a problem now.  Pt verbalized understanding.

## 2011-10-31 ENCOUNTER — Ambulatory Visit: Payer: Medicare Other

## 2011-11-02 ENCOUNTER — Telehealth: Payer: Self-pay

## 2011-11-02 NOTE — Telephone Encounter (Signed)
Via Phone Interpretation: Pt is calling to say his medications are short. He has more in one bottle than the other.  He states he has a small amount in the Norvir bottle and more in the Truvada bottle.  Pt was informed the pills are different sizes so they may appear to be more in one and not the other.   Pt stated he did not have enough to complete his pill box.  I asked was it a one month pill box or seven day box and he stated 7 days.    I again told the patient he would need to count the remaining tablets add 7 and if the amount does not equal 30 with each since he takes one daily he should contact the pharmacy.   He has agreed.  Laverle Patter, RN

## 2012-01-01 ENCOUNTER — Telehealth: Payer: Self-pay | Admitting: *Deleted

## 2012-01-01 NOTE — Telephone Encounter (Signed)
Patient called and left a message on voicemail so I returned his call and was unable to get him. In the message he advised he needed his meds called in to the pharmacy as he was having trouble getting them. Called the pharmacy and was advised he has refills all he needed to do was request them. Will advise the patient when he calls back.

## 2012-02-01 ENCOUNTER — Other Ambulatory Visit (INDEPENDENT_AMBULATORY_CARE_PROVIDER_SITE_OTHER): Payer: Medicare Other

## 2012-02-01 DIAGNOSIS — B20 Human immunodeficiency virus [HIV] disease: Secondary | ICD-10-CM | POA: Diagnosis not present

## 2012-02-02 LAB — T-HELPER CELL (CD4) - (RCID CLINIC ONLY): CD4 % Helper T Cell: 19 % — ABNORMAL LOW (ref 33–55)

## 2012-02-15 ENCOUNTER — Encounter: Payer: Self-pay | Admitting: Internal Medicine

## 2012-02-15 ENCOUNTER — Ambulatory Visit (INDEPENDENT_AMBULATORY_CARE_PROVIDER_SITE_OTHER): Payer: Medicare Other | Admitting: Internal Medicine

## 2012-02-15 VITALS — BP 130/87 | HR 70 | Temp 98.2°F | Wt 202.0 lb

## 2012-02-15 DIAGNOSIS — B20 Human immunodeficiency virus [HIV] disease: Secondary | ICD-10-CM | POA: Diagnosis not present

## 2012-02-15 DIAGNOSIS — Z79899 Other long term (current) drug therapy: Secondary | ICD-10-CM

## 2012-02-15 DIAGNOSIS — Z23 Encounter for immunization: Secondary | ICD-10-CM

## 2012-02-15 NOTE — Progress Notes (Signed)
Patient ID: Matthew Fitzpatrick, male   DOB: Nov 02, 1980, 31 y.o.   MRN: [id number]     [facility] for Infectious Disease  Patient Active Problem List  Diagnosis  . HIV DISEASE  . PEDICULOSIS PUBIS (PUBIC LOUSE)  . ANXIETY  . DEPRESSION  . DEAFNESS, CONGENITAL  . ALLERGIC RHINITIS  . PROCTITIS  . ERECTILE DYSFUNCTION, ORGANIC  . CHICKENPOX, HX OF  . GERD (gastroesophageal reflux disease)    Patient's Medications  New Prescriptions   No medications on file  Previous Medications   DARUNAVIR ETHANOLATE (PREZISTA) 800 MG TABS    Take 800 mg by mouth daily.   EMTRICITABINE-TENOFOVIR (TRUVADA) 200-300 MG PER TABLET    Take 1 tablet by mouth daily.   OMEPRAZOLE (PRILOSEC) 20 MG CAPSULE    Take 1 capsule (20 mg total) by mouth daily.   RITONAVIR (NORVIR) 100 MG TABS    Take 1 tablet (100 mg total) by mouth daily.  Modified Medications   No medications on file  Discontinued Medications   No medications on file    Subjective: Matthew Fitzpatrick is in for his routine visit. He states that he has been running out of his Norvir sooner than his other medications. He has a picture of his bottles and he does indicate that there are 30 pills in each bottle. He states that when he does run sure he calls [facility] and they deliver more. He denies missing any of his medications. He is using his pillbox. He denies any recent problems.  Objective: Temp: 98.2 F (36.8 C) (12/19 0925) Temp src: Oral (12/19 0925) BP: 130/87 mmHg (12/19 0925) Pulse Rate: 70  (12/19 0925)  General: He appears to be in good spirits Skin: No rash Oral: No lesions Lungs: Clear Cor: Regular S1 and S2 no murmurs  Lab Results HIV 1 RNA Quant (copies/mL)  Date Value  02/01/2012 46*  08/01/2011 <20   01/26/2011 <20      CD4 T Cell Abs (cmm)  Date Value  02/01/2012 440   08/01/2011 490   01/26/2011 620      Assessment: His HIV infection is under good control. I am not sure why he thinks he is running short on his Norvir  but I asked him to bring in a new bottle when he gets his refills and we can count the tablets with him.  Plan: 1. Continue current antiretroviral regimen 2. Follow up after blood work in 6 months   Michel Bickers, MD [facility] for [place] [phone] pager   [phone] cell 02/15/2012, 9:49 AM

## 2012-03-08 ENCOUNTER — Other Ambulatory Visit: Payer: Self-pay | Admitting: *Deleted

## 2012-03-08 ENCOUNTER — Telehealth: Payer: Self-pay | Admitting: *Deleted

## 2012-03-08 DIAGNOSIS — B20 Human immunodeficiency virus [HIV] disease: Secondary | ICD-10-CM

## 2012-03-08 NOTE — Telephone Encounter (Signed)
Pt left message in triage asking Langley Gauss to call him regarding a problem with his medications "not being covered" at the pharmacy. Message forwarded to her extension. This RN called the pharmacy at [phone] and confirmed with the PharmD that all prescriptions were mailed 03/04/12 (#30 Norvir, #30 Truvada, #30 Presizta 857m, #30 Omeprazole).  Phone call to patient via telecommunication service to confirm delivery, but the call went to voicemail and the telecommunication service was unable to leave a message. HLandis Gandy RN

## 2012-03-11 ENCOUNTER — Ambulatory Visit: Payer: Medicare Other

## 2012-03-12 NOTE — Telephone Encounter (Signed)
Pt in to talk with B. Hammond about re-applying for SPAP.  Matthew Fitzpatrick shared with the pt that the he would be responsible for paying for Medicare Part D monthly in order to receive his medications.  B. Phylliss Bob gave the pt telephone numbers to contact to discuss Medicare Part D.

## 2012-03-18 ENCOUNTER — Telehealth: Payer: Self-pay

## 2012-03-18 NOTE — Telephone Encounter (Signed)
Patient came in last week to recertify for SPAP - he was still agitated about payment for the Medicare Part D - said it went up in cost per month - advised him to call the [facility] office and [name] at [employer] to see if they could offer suggestions, other part D plans, etc  - received message from [place] stating patient was "blaming [employer]AP for having to do this in the first place" and would not be receptive to any advice - thanked her for trying to help him and reiterated that practically all of the staff at [facility] has talked to him about this - that if he does NOT continue with the Part D supplement, he will no longer be eligible for the SPAP program and will no longer receive financial help for his hiv medications.

## 2012-03-27 ENCOUNTER — Telehealth: Payer: Self-pay | Admitting: *Deleted

## 2012-03-27 NOTE — Telephone Encounter (Signed)
Returned pt's call.  Left message to call Chrisandra Carota, CM at [facility] in [facility] at [phone] ext [phone] to set up an appt to discuss Part D Medicare.  Pt is approved for SPAP and prescriptions are delivered to his home without co-pays.  Pt's message indicated that "the cost of Part D Medicare" was increasing and this was a concern for him. Message left for Chrisandra Carota, TH[facility] CM.  Pt having difficulty understanding Part D Medicare costs and being able to receive his medications through SPAP without paying co-pays.  Recommended that pt call [facility] office to speak the CM.

## 2012-04-30 ENCOUNTER — Other Ambulatory Visit: Payer: Self-pay | Admitting: Licensed Clinical Social Worker

## 2012-04-30 DIAGNOSIS — B20 Human immunodeficiency virus [HIV] disease: Secondary | ICD-10-CM

## 2012-04-30 DIAGNOSIS — K219 Gastro-esophageal reflux disease without esophagitis: Secondary | ICD-10-CM

## 2012-04-30 MED ORDER — OMEPRAZOLE 20 MG PO CPDR: 20.0000 mg | DELAYED_RELEASE_CAPSULE | Freq: Every day | ORAL | Status: DC

## 2012-04-30 MED ORDER — RITONAVIR 100 MG PO TABS: 100.0000 mg | ORAL_TABLET | Freq: Every day | ORAL | Status: DC

## 2012-04-30 MED ORDER — DARUNAVIR ETHANOLATE 800 MG PO TABS: 800.0000 mg | ORAL_TABLET | Freq: Every day | ORAL | Status: DC

## 2012-04-30 MED ORDER — EMTRICITABINE-TENOFOVIR DF 200-300 MG PO TABS: 1.0000 | ORAL_TABLET | Freq: Every day | ORAL | Status: DC

## 2012-06-28 ENCOUNTER — Telehealth: Payer: Self-pay | Admitting: *Deleted

## 2012-06-28 NOTE — Telephone Encounter (Signed)
Patient called requesting an appointment today, Friday, 06/28/12.  Pt reports stomach pain and diarrhea x 7 days, no fever or N/V.  Pt has tried a clear liquid diet without effect.  States the diarrhea was constant for the first 1.5 days, has now slowed to 5-7 episodes per day.  Patient able to eat and keep food down, but will then "feel sick, have cramps" and will have diarrhea.  Pt describes it as "yellow and very wet with a foul smell."  Pt given appointment with Dr. [name] for 07/02/12 at 9:30; advised to stay hydrated with clear liquids and add bland, bulking foods like bananas, beans, and plain pastas throughout the weekend; pt can take immodium OTC, following the instructions on the box. Pt instructed to go to the ED if his symptoms get worse over the weekend for stool testing.  Pt verbalized understanding and agreement with plan via interpreter.  Pt will need sign language interpreter during his office visit.  This is noted in his demographics. Landis Gandy, RN

## 2012-07-02 ENCOUNTER — Encounter: Payer: Self-pay | Admitting: Internal Medicine

## 2012-07-02 ENCOUNTER — Ambulatory Visit (INDEPENDENT_AMBULATORY_CARE_PROVIDER_SITE_OTHER): Payer: Medicare Other | Admitting: Internal Medicine

## 2012-07-02 VITALS — BP 130/87 | HR 64 | Ht 68.0 in | Wt 200.0 lb

## 2012-07-02 DIAGNOSIS — E785 Hyperlipidemia, unspecified: Secondary | ICD-10-CM | POA: Diagnosis not present

## 2012-07-02 DIAGNOSIS — R197 Diarrhea, unspecified: Secondary | ICD-10-CM | POA: Diagnosis not present

## 2012-07-02 HISTORY — DX: Hyperlipidemia, unspecified: E78.5

## 2012-07-02 NOTE — Progress Notes (Signed)
Patient ID: Matthew Fitzpatrick, male   DOB: Jun 29, 1980, 32 y.o.   MRN: [id number]          [facility] for Infectious Disease  Patient Active Problem List   Diagnosis Date Noted  . Diarrhea 07/02/2012  . Dyslipidemia 07/02/2012  . GERD (gastroesophageal reflux disease) 06/28/2010  . 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 HIV DISEASE 03/02/2006  . ANXIETY 03/02/2006  . DEPRESSION 03/02/2006  . ALLERGIC RHINITIS 03/02/2006    Patient's Medications  New Prescriptions   No medications on file  Previous Medications   DARUNAVIR ETHANOLATE (PREZISTA) 800 MG TABLET    Take 1 tablet (800 mg total) by mouth daily.   EMTRICITABINE-TENOFOVIR (TRUVADA) 200-300 MG PER TABLET    Take 1 tablet by mouth daily.   LOPERAMIDE HCL (IMODIUM PO)    Take by mouth.   OMEPRAZOLE (PRILOSEC) 20 MG CAPSULE    Take 1 capsule (20 mg total) by mouth daily.   RITONAVIR (NORVIR) 100 MG TABS    Take 1 tablet (100 mg total) by mouth daily.  Modified Medications   No medications on file  Discontinued Medications   No medications on file    Subjective: Matthew Fitzpatrick is in for a work in visit. For the last 10 days he has been bothered by watery diarrhea and abdominal cramps. He has not had any nausea, vomiting, fever, chills, or sweats. He has not had any sick contacts that he knows of. Initially his diarrhea was occurring 20 times daily. It has slowed down recently since he started taking Imodium but he is continuing to have abdominal cramps. He had 2 loose bowel movements over night and one this morning before coming to clinic. He denies missing any doses of his HIV medications and can pick them off the chart and describe taking them correctly.  Objective: BP: 130/87 mmHg (05/06 0926) Pulse Rate: 64 (05/06 0926)  General: He is in no distress and in good spirits Oral: No thrush or other lesions Skin: No rash Lungs: Clear Cor: Regular S1 and S2 no murmurs Abdomen: Soft  with mild upper quadrant tenderness and quiet bowel sounds  Lab Results HIV 1 RNA Quant (copies/mL)  Date Value  02/01/2012 46*  08/01/2011 <20   01/26/2011 <20      CD4 T Cell Abs (cmm)  Date Value  02/01/2012 440   08/01/2011 490   01/26/2011 620      Lab Results  Component Value Date   CHOL 202* 08/01/2011   HDL 31* 08/01/2011   LDLCALC 114* 08/01/2011   TRIG 283* 08/01/2011   CHOLHDL 6.5 08/01/2011   Lab Results  Component Value Date   CREATININE 0.95 08/01/2011   BUN 10 08/01/2011   NA 138 08/01/2011   K 3.6 08/01/2011   CL 103 08/01/2011   CO2 26 08/01/2011   Lab Results  Component Value Date   ALT 13 08/01/2011   AST 16 08/01/2011   ALKPHOS 143* 08/01/2011   BILITOT 0.4 08/01/2011   Lab Results  Component Value Date   WBC 5.6 08/01/2011   HGB 15.4 08/01/2011   HCT 43.0 08/01/2011   MCV 86.7 08/01/2011   PLT 260 08/01/2011   Assessment: He has nonspecific gastroenteritis with diarrhea that seems to be improving with time and Imodium. He does not think he can give [place] a stool specimen here today. I will send him home with a specimen cup and instructed him to bring in a specimen if  the diarrhea persists.   His HIV infection remains under good control although he said a slight bump in his viral load. It appears that his adherence has improved.  He continues to have some dyslipidemia. I talked to him about lifestyle modification. He is due to come back after lab work in June and I will review this with him again at that time.  Plan: 1. Continue current antiretroviral regimen 2. Lifestyle modification for dyslipidemia 3. Stool for culture and C. difficile PCR if diarrhea persists 4. Followup after lab work in June   Michel Bickers, [place] for [place] [phone] pager   [phone] cell 07/02/2012, 10:00 AM

## 2012-08-06 ENCOUNTER — Other Ambulatory Visit: Payer: Medicare Other

## 2012-08-06 DIAGNOSIS — Z79899 Other long term (current) drug therapy: Secondary | ICD-10-CM | POA: Diagnosis not present

## 2012-08-06 DIAGNOSIS — B20 Human immunodeficiency virus [HIV] disease: Secondary | ICD-10-CM

## 2012-08-06 LAB — CBC
HCT: 43.6 % (ref 39.0–52.0)
MCH: 30.6 pg (ref 26.0–34.0)
MCV: 86.7 fL (ref 78.0–100.0)
Platelets: 246 10*3/uL (ref 150–400)
RBC: 5.03 MIL/uL (ref 4.22–5.81)

## 2012-08-06 LAB — COMPREHENSIVE METABOLIC PANEL
ALT: 18 U/L (ref 0–53)
BUN: 11 mg/dL (ref 6–23)
CO2: 25 mEq/L (ref 19–32)
Calcium: 9.4 mg/dL (ref 8.4–10.5)
Chloride: 101 mEq/L (ref 96–112)
Creat: 0.94 mg/dL (ref 0.50–1.35)
Total Bilirubin: 0.4 mg/dL (ref 0.3–1.2)

## 2012-08-06 LAB — LIPID PANEL
Cholesterol: 237 mg/dL — ABNORMAL HIGH (ref 0–200)
HDL: 43 mg/dL (ref >39)
Total CHOL/HDL Ratio: 5.5 Ratio
Triglycerides: 158 mg/dL — ABNORMAL HIGH (ref <150)
VLDL: 32 mg/dL (ref 0–40)

## 2012-08-07 LAB — T-HELPER CELL (CD4) - (RCID CLINIC ONLY): CD4 T Cell Abs: 680 uL (ref 400–2700)

## 2012-08-20 ENCOUNTER — Telehealth: Payer: Self-pay | Admitting: *Deleted

## 2012-08-20 ENCOUNTER — Ambulatory Visit: Payer: Medicare Other | Admitting: Internal Medicine

## 2012-08-20 NOTE — Telephone Encounter (Signed)
New appt for July 29th w/ Dr. [name].

## 2012-08-26 ENCOUNTER — Ambulatory Visit: Payer: Medicare Other | Admitting: Internal Medicine

## 2012-09-24 ENCOUNTER — Ambulatory Visit (INDEPENDENT_AMBULATORY_CARE_PROVIDER_SITE_OTHER): Payer: Medicare Other | Admitting: Internal Medicine

## 2012-09-24 ENCOUNTER — Encounter: Payer: Self-pay | Admitting: Internal Medicine

## 2012-09-24 VITALS — BP 128/82 | HR 66 | Temp 97.8°F | Ht 69.0 in | Wt 208.8 lb

## 2012-09-24 DIAGNOSIS — Z79899 Other long term (current) drug therapy: Secondary | ICD-10-CM | POA: Diagnosis not present

## 2012-09-24 DIAGNOSIS — B20 Human immunodeficiency virus [HIV] disease: Secondary | ICD-10-CM

## 2012-09-24 NOTE — Progress Notes (Signed)
Patient ID: Matthew Fitzpatrick, male   DOB: [birth year], 32 y.o.   MRN: [id number]          [facility] for Infectious Disease  Patient Active Problem List   Diagnosis Date Noted  . Diarrhea 07/02/2012  . Dyslipidemia 07/02/2012  . GERD (gastroesophageal reflux disease) 06/28/2010  . 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 HIV DISEASE 03/02/2006  . ANXIETY 03/02/2006  . DEPRESSION 03/02/2006  . ALLERGIC RHINITIS 03/02/2006    Patient's Medications  New Prescriptions   No medications on file  Previous Medications   DARUNAVIR ETHANOLATE (PREZISTA) 800 MG TABLET    Take 1 tablet (800 mg total) by mouth daily.   EMTRICITABINE-TENOFOVIR (TRUVADA) 200-300 MG PER TABLET    Take 1 tablet by mouth daily.   OMEPRAZOLE (PRILOSEC) 20 MG CAPSULE    Take 1 capsule (20 mg total) by mouth daily.   RITONAVIR (NORVIR) 100 MG TABS    Take 1 tablet (100 mg total) by mouth daily.  Modified Medications   No medications on file  Discontinued Medications   LOPERAMIDE HCL (IMODIUM PO)    Take by mouth.    Subjective: [name] is in for his routine visit. His diarrhea resolved promptly after his last visit and he has not needed to take anything more for that. Initially denied missing any doses of his Truvada, Prezista or Norvir year but then tells me that [facility] was 10 days late on his latest prescription. His all of his meals at fast food restaurants such as [employer], [employer], [employer] and [employer] [place] chicken.  He still has intermittent problems with acid indigestion although he takes Prilosec every day. He has had some teeth that are sensitive to hot and cold but has not been able to afford to see a dentist.  Review of Systems: Pertinent items are noted in HPI.  Past Medical History  Diagnosis Date  . Deaf     History  Substance Use Topics  . Smoking status: Never Smoker   . Smokeless tobacco: Never Used  . Alcohol Use: No    No family  history on file.  Allergies  Allergen Reactions  . Menthol Swelling    Objective: Temp: 97.8 F (36.6 C) (07/29 1029) Temp src: Oral (07/29 1029) BP: 128/82 mmHg (07/29 1029) Pulse Rate: 66 (07/29 1029)  General: His weight is up. Oral: No oropharyngeal lesions Skin: No rash Lungs: Clear Cor: Regular S1 and S2 no murmurs Abdomen: Obese, soft and nontender Joints and extremities: No acute abnormalities Neuro: Alert and working crossword puzzles upon entering the room Mood and affect: Normal  Lab Results HIV 1 RNA Quant (copies/mL)  Date Value  08/06/2012 <20   02/01/2012 46*  08/01/2011 <20      CD4 T Cell Abs (cmm)  Date Value  08/06/2012 680   02/01/2012 440   08/01/2011 490      Lab Results  Component Value Date   WBC 5.4 08/06/2012   HGB 15.4 08/06/2012   HCT 43.6 08/06/2012   MCV 86.7 08/06/2012   PLT 246 08/06/2012   BMET    Component Value Date/Time   NA 135 08/06/2012 1223   K 3.7 08/06/2012 1223   CL 101 08/06/2012 1223   CO2 25 08/06/2012 1223   GLUCOSE 97 08/06/2012 1223   BUN 11 08/06/2012 1223   CREATININE 0.94 08/06/2012 1223   CREATININE 0.88 08/01/2010 1938   CALCIUM 9.4 08/06/2012 1223   GFRNONAA >60 08/01/2010 1938  GFRAA  Value: >60        The eGFR has been calculated using the MDRD equation. This calculation has not been validated in all clinical situations. eGFR's persistently <60 mL/min signify possible Chronic Kidney Disease. 08/01/2010 1938   Lab Results  Component Value Date   ALT 18 08/06/2012   AST 17 08/06/2012   ALKPHOS 172* 08/06/2012   BILITOT 0.4 08/06/2012   Lab Results  Component Value Date   CHOL 237* 08/06/2012   HDL 43 08/06/2012   LDLCALC 162* 08/06/2012   TRIG 158* 08/06/2012   CHOLHDL 5.5 08/06/2012   Assessment: His infection remains under excellent control.  His LDL is elevated and I talked to him about the importance of modifying his diet. He is interested in seeing a nutritionist. He does have the ability to prepare meals at  home.  Plan: 1. Continue current medications 2. Nutrition referral 3. Dental referral 4. Followup after lab work in [place] months   Michel Bickers, MD [facility] for [place] [phone] pager   [phone] cell 09/24/2012, 10:51 AM

## 2012-09-30 ENCOUNTER — Ambulatory Visit: Payer: Medicare Other

## 2012-09-30 VITALS — Ht 69.0 in | Wt 206.5 lb

## 2012-09-30 DIAGNOSIS — E785 Hyperlipidemia, unspecified: Secondary | ICD-10-CM

## 2012-09-30 NOTE — Progress Notes (Signed)
Medical Nutrition Therapy:  Appt start time: 1030 end time:  1130.  Assessment:  Primary concerns today: TC 237, LDL 162, Alk Phos 172. MD notes patient seems to eat almost exclusively at fast food restaurants.  MEDICATIONS: see list.   DIETARY INTAKE: Usual eating pattern includes 2 meals and 0 snacks per day. Everyday foods include eggs, sausage, bacon, chicken sandwich, fries.  Avoided foods include diet soda.    24-hr recall:  B ( AM): [place] eggs, bacon, sausage. Sweet tea. Occasional cereal at home.  Snk ( AM): none  L ( PM): not much lunch. Essentially every day he skips lunch. Snk ( PM): none D ( PM): [employer]- favorite is chicken sandwich with french fries and sweet tea. Occasional ice cream for dessert when its hot.  Snk ( PM): none Beverages: sweet tea, kool aid in water without sugar, water, no soda, sometimes OJ, no EtOH.  Pt admits he does not really know how to cook, although he says he does not dislike it. Usual physical activity: small amount of exercise at home. Will do a few pull ups throughout the week. He claims to enjoy cycling but his current bike is broken and old. He is out of work now and cannot afford unfortunately to buy a new one.  Progress Towards Goal(s):  In progress.   Nutritional Diagnosis:  NI-5.6.2 Excessive fat intake As related to hypercholesterolemia, routine fast food choices.  As evidenced by TC 237, LDL 162, diet pattern rich in total fat, high saturated fat choices.    Intervention:  Nutrition counseling provided on reducing total fat, saturated fat, sugar intake. RD recommended weight loss efforts to help assist in improving lipid profile, and discussed better options when eating out (less fries, more salad, chili, baked potato as sides for example), simple cooking options that are lean, microwaveable meal options that are good choices (SmartOnes, Healthy Choice, Lean Cuisine, SteamFresh vegetables). RD also provided a home workout that  requires no equipment investment, and instructed the patient to do all the moves at least 3 days per week as in the written instructions. RD recommended minimum total exercise of 150 minutes per week.   Handouts given during visit include:  Best Protein, Fat, and CHO food choices for heart health  Home Workout  Monitoring/Evaluation:  Dietary intake, exercise, portion control, and body weight prn.

## 2012-10-08 ENCOUNTER — Telehealth: Payer: Self-pay | Admitting: *Deleted

## 2012-10-08 NOTE — Telephone Encounter (Signed)
Patient called and advised he was concerned that his medication is not coming on time. Advised the patient he does this every month and it always comes on time but that I will call the pharmacy and find out when they will be shipped. Called the pharmacy and they advised they shipped the medications yesterday and they should be to the patient by tomorrow at the latest.

## 2012-11-04 ENCOUNTER — Ambulatory Visit: Payer: Medicare Other

## 2012-11-11 ENCOUNTER — Ambulatory Visit: Payer: Medicare Other

## 2012-12-31 ENCOUNTER — Telehealth: Payer: Self-pay | Admitting: *Deleted

## 2012-12-31 ENCOUNTER — Other Ambulatory Visit: Payer: Self-pay | Admitting: *Deleted

## 2012-12-31 DIAGNOSIS — K219 Gastro-esophageal reflux disease without esophagitis: Secondary | ICD-10-CM

## 2012-12-31 DIAGNOSIS — B20 Human immunodeficiency virus [HIV] disease: Secondary | ICD-10-CM

## 2012-12-31 MED ORDER — EMTRICITABINE-TENOFOVIR DF 200-300 MG PO TABS: 1.0000 | ORAL_TABLET | Freq: Every day | ORAL | Status: DC

## 2012-12-31 MED ORDER — RITONAVIR 100 MG PO TABS: 100.0000 mg | ORAL_TABLET | Freq: Every day | ORAL | Status: DC

## 2012-12-31 MED ORDER — OMEPRAZOLE 20 MG PO CPDR: 20.0000 mg | DELAYED_RELEASE_CAPSULE | Freq: Every day | ORAL | Status: DC

## 2012-12-31 MED ORDER — DARUNAVIR ETHANOLATE 800 MG PO TABS: 800.0000 mg | ORAL_TABLET | Freq: Every day | ORAL | Status: DC

## 2012-12-31 NOTE — Telephone Encounter (Signed)
Requested pt talk with another staff member due to being w/ patients this afternoon.  [facility]ID # left.

## 2013-01-03 ENCOUNTER — Telehealth: Payer: Self-pay | Admitting: *Deleted

## 2013-01-03 NOTE — Telephone Encounter (Signed)
Patient has a question about a letter he received regarding his medications.  He would like for Lorne Skeens to call him back.  RN offered to help, but pt prefers to speak with Langley Gauss. Landis Gandy, RN

## 2013-01-06 ENCOUNTER — Other Ambulatory Visit: Payer: Self-pay | Admitting: *Deleted

## 2013-01-06 DIAGNOSIS — B20 Human immunodeficiency virus [HIV] disease: Secondary | ICD-10-CM

## 2013-01-06 DIAGNOSIS — K219 Gastro-esophageal reflux disease without esophagitis: Secondary | ICD-10-CM

## 2013-01-06 MED ORDER — RITONAVIR 100 MG PO TABS: 100.0000 mg | ORAL_TABLET | Freq: Every day | ORAL | Status: DC

## 2013-01-06 MED ORDER — DARUNAVIR ETHANOLATE 800 MG PO TABS: 800.0000 mg | ORAL_TABLET | Freq: Every day | ORAL | Status: DC

## 2013-01-06 MED ORDER — OMEPRAZOLE 20 MG PO CPDR: 20.0000 mg | DELAYED_RELEASE_CAPSULE | Freq: Every day | ORAL | Status: DC

## 2013-01-06 MED ORDER — EMTRICITABINE-TENOFOVIR DF 200-300 MG PO TABS: 1.0000 | ORAL_TABLET | Freq: Every day | ORAL | Status: DC

## 2013-01-06 NOTE — Telephone Encounter (Signed)
Spoke with pt through telephone interpreter.  The letter he received in the mail was from Medicare and needing to sign up again this fall.  Pt stated that it appears to be too expensive.  Pt given Sammuel Bailiff telephone number to talk with her about this issue.

## 2013-01-06 NOTE — Telephone Encounter (Signed)
RN spoke with pt.  He had a question about signing up for Medicare Part D and the cost.  Referred him to P. Jones to talk about help.

## 2013-01-14 ENCOUNTER — Telehealth: Payer: Self-pay | Admitting: *Deleted

## 2013-01-14 NOTE — Telephone Encounter (Signed)
[employer].  Was unable to leave a message.  Phone stopped ringing.  Will try again tomorrow

## 2013-01-31 ENCOUNTER — Telehealth: Payer: Self-pay | Admitting: *Deleted

## 2013-01-31 NOTE — Telephone Encounter (Signed)
Patient called c/o abdominal pain and diarrhea that has been going on for "quite awhile". Asked if he had a primary care MD and he said yes; I advised him to call and make an appt to be seen. He said he had an appt in January and I told him to request a sooner appt for this issue. He agreed to do this. Myrtis Hopping

## 2013-02-05 ENCOUNTER — Telehealth: Payer: Self-pay | Admitting: *Deleted

## 2013-02-05 ENCOUNTER — Encounter: Payer: Self-pay | Admitting: Internal Medicine

## 2013-02-05 ENCOUNTER — Ambulatory Visit (INDEPENDENT_AMBULATORY_CARE_PROVIDER_SITE_OTHER): Payer: Medicare Other | Admitting: Internal Medicine

## 2013-02-05 VITALS — BP 130/83 | HR 62 | Temp 98.3°F | Wt 203.0 lb

## 2013-02-05 DIAGNOSIS — R109 Unspecified abdominal pain: Secondary | ICD-10-CM

## 2013-02-05 DIAGNOSIS — A09 Infectious gastroenteritis and colitis, unspecified: Secondary | ICD-10-CM | POA: Diagnosis not present

## 2013-02-05 LAB — CBC WITH DIFFERENTIAL/PLATELET
Basophils Relative: 1 % (ref 0–1)
Eosinophils Absolute: 0 10*3/uL (ref 0.0–0.7)
Eosinophils Relative: 1 % (ref 0–5)
HCT: 44.5 % (ref 39.0–52.0)
Lymphocytes Relative: 53 % — ABNORMAL HIGH (ref 12–46)
Lymphs Abs: 2.9 10*3/uL (ref 0.7–4.0)
MCH: 31.8 pg (ref 26.0–34.0)
MCHC: 35.7 g/dL (ref 30.0–36.0)
MCV: 89 fL (ref 78.0–100.0)
Monocytes Absolute: 0.6 10*3/uL (ref 0.1–1.0)
Platelets: 231 10*3/uL (ref 150–400)
RBC: 5 MIL/uL (ref 4.22–5.81)
RDW: 12.8 % (ref 11.5–15.5)
WBC: 5.6 10*3/uL (ref 4.0–10.5)

## 2013-02-05 LAB — BASIC METABOLIC PANEL WITH GFR
BUN: 10 mg/dL (ref 6–23)
CO2: 26 mEq/L (ref 19–32)
Calcium: 9.1 mg/dL (ref 8.4–10.5)
Chloride: 105 mEq/L (ref 96–112)
Creat: 0.96 mg/dL (ref 0.50–1.35)
GFR, Est African American: >89 mL/min
GFR, Est Non African American: >89 mL/min

## 2013-02-05 MED ORDER — OXYCODONE-ACETAMINOPHEN 5-325 MG PO TABS: 1.0000 | ORAL_TABLET | ORAL | Status: DC | PRN

## 2013-02-05 MED ORDER — METRONIDAZOLE 500 MG PO TABS: 500.0000 mg | ORAL_TABLET | Freq: Three times a day (TID) | ORAL | Status: DC

## 2013-02-05 MED ORDER — SODIUM CHLORIDE 0.9 % IV BOLUS (SEPSIS)
1000.0000 mL | Freq: Once | INTRAVENOUS | Status: AC
  Administered 2013-02-05: 1000 mL via INTRAVENOUS

## 2013-02-05 NOTE — Progress Notes (Signed)
Subjective:    Patient ID: Matthew Fitzpatrick, male    DOB: 09-21-1980, 32 y.o.   MRN: [id number]  HPI Matthew Fitzpatrick is a 32yo M with HIV who is hearing impaired, CD 4 count of 860/VL<20, on truvada/DRVr. Here in clinic with translator. He reportsh that 6 days ago,he started to have frequent bouts of diarrhea and abdominal pain that progressed to being profuse, not eating, low appetite, now with having watery diarrhea, trace blood noted in stool in the last 3 days. More than 10 - 15 episodes/day. Having mild 3/10 abdominal pain at baseline that worsens to severe pain and cramping with diarrhea. No nausea, no vomiting. No fever, chills, nightsweats. He is able to drink but not eating. No sick contact. Urine output is clear, no decrease in urine output. Feels worse today than on Monday. Running out of toilet paper.anus is sore from frequent BM; now reports just tired/worn out. No dizziness. Tried taking expired bentyl. And taking advil to help his abdominal pain  44yr ago had similar episode that resolved spontaneously but did get bentyl rx  No recent travel. He drinks bottled water.  Current Outpatient Prescriptions on File Prior to Visit  Medication Sig Dispense Refill  . Darunavir Ethanolate (PREZISTA) 800 MG tablet Take 1 tablet (800 mg total) by mouth daily.  30 tablet  6  . emtricitabine-tenofovir (TRUVADA) 200-300 MG per tablet Take 1 tablet by mouth daily.  30 tablet  6  . omeprazole (PRILOSEC) 20 MG capsule Take 1 capsule (20 mg total) by mouth daily.  30 capsule  6  . ritonavir (NORVIR) 100 MG TABS tablet Take 1 tablet (100 mg total) by mouth daily.  30 tablet  6   No current facility-administered medications on file prior to visit.   Active Ambulatory Problems    Diagnosis Date Noted  . HIV DISEASE 03/02/2006  . ANXIETY 03/02/2006  . DEPRESSION 03/02/2006  . 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 ALLERGIC RHINITIS 03/02/2006  . 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 GERD  (gastroesophageal reflux disease) 06/28/2010  . Diarrhea 07/02/2012  . Dyslipidemia 07/02/2012   Resolved Ambulatory Problems    Diagnosis Date Noted  . PEDICULOSIS PUBIS (PUBIC LOUSE) 06/20/2006  . SINUSITIS, CHRONIC 02/18/2009  . GERD 07/20/2009  . 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/26/2006  . FACIAL RASH 11/17/2008  . COUGH, CHRONIC 12/23/2008  . Diarrhea 07/27/2009  . Urinary frequency 04/06/2009  . CHICKENPOX, HX OF 06/20/2006  . Bronchitis 01/26/2011   Past Medical History  Diagnosis Date  . Deaf      Review of Systems 12 point ros reveiwed, positive and negative pertinents listed in hpi    Objective:   Physical Exam BP 130/83  Pulse 62  Temp(Src) 98.3 F (36.8 C) (Oral)  Wt 203 lb (92.08 kg) Physical Exam  Constitutional: He is oriented to person, place, and time. He appears well-developed and well-nourished. No distress.  HENT:  Mouth/Throat: Oropharynx is clear and moist. No oropharyngeal exudate.  Cardiovascular: Normal rate, regular rhythm and normal heart sounds. Exam reveals no gallop and no friction rub.  No murmur heard.  Pulmonary/Chest: Effort normal and breath sounds normal. No respiratory distress. He has no wheezes.  Abdominal: Soft. Bowel sounds are hypoactive. He exhibits no distension. There is no tenderness.  Lymphadenopathy:  He has no cervical adenopathy.  Neurological: He is alert and oriented to person, place, and time.  Skin: Skin is warm and dry. No rash noted. No erythema.  Psychiatric: He has a normal mood and affect.  His behavior is normal.       Assessment & Plan:   acute diarrhea = concern for infectious causes, will check cbc with diff, bmp, ua, stool studies, cdiff pcr. Will give empiric trial of metronidazole. Give pain meds for abdominal pain.suspect he might have hypokalemia due to diarrhea, but will wait to see what bmp results are to determine if need to replete electrolytes. Recommend he drinks gatorade or other sport drinks  Dehydration = Give  ivf= 1L in clinic. Doesn't meet criteria for admit presently. If not improved in hte next 1-2 days, will admit from clinic. Re-assessment  Abdominal pain = give rx for percocet  rtc in 1 wk

## 2013-02-05 NOTE — Telephone Encounter (Signed)
Has been using an "out-of-date" medication, prescribed in 2010.  Pt given work-in appt for this morning to see Dr. Baxter Flattery.  Sign language interpreter to be present for visit.

## 2013-02-06 ENCOUNTER — Telehealth: Payer: Self-pay | Admitting: *Deleted

## 2013-02-06 NOTE — Telephone Encounter (Signed)
Pt reports feeling much better today, though still having some leg cramps.  Would like to know if he needs to keep his 1 week follow up appointment.  RN suggested he does, patient agreed.  Pt will contact us if anything else changes before his appointment. Landis Gandy, RN

## 2013-02-12 ENCOUNTER — Ambulatory Visit: Payer: Medicare Other | Admitting: Internal Medicine

## 2013-02-13 ENCOUNTER — Encounter (HOSPITAL_COMMUNITY): Payer: Self-pay | Admitting: Emergency Medicine

## 2013-02-13 ENCOUNTER — Emergency Department (HOSPITAL_COMMUNITY)
Admission: EM | Admit: 2013-02-13 | Discharge: 2013-02-14 | Disposition: A | Discharge status: Home / Self Care | Payer: Medicare Other | Attending: Emergency Medicine | Admitting: Emergency Medicine

## 2013-02-13 ENCOUNTER — Telehealth: Payer: Self-pay | Admitting: *Deleted

## 2013-02-13 DIAGNOSIS — H9190 Unspecified hearing loss, unspecified ear: Secondary | ICD-10-CM | POA: Insufficient documentation

## 2013-02-13 DIAGNOSIS — K219 Gastro-esophageal reflux disease without esophagitis: Secondary | ICD-10-CM | POA: Insufficient documentation

## 2013-02-13 DIAGNOSIS — Z21 Asymptomatic human immunodeficiency virus [HIV] infection status: Secondary | ICD-10-CM | POA: Insufficient documentation

## 2013-02-13 DIAGNOSIS — Z8701 Personal history of pneumonia (recurrent): Secondary | ICD-10-CM | POA: Diagnosis not present

## 2013-02-13 DIAGNOSIS — R1013 Epigastric pain: Secondary | ICD-10-CM

## 2013-02-13 DIAGNOSIS — Z79899 Other long term (current) drug therapy: Secondary | ICD-10-CM | POA: Diagnosis not present

## 2013-02-13 HISTORY — DX: Human immunodeficiency virus (HIV) disease: B20

## 2013-02-13 HISTORY — DX: Pneumonia, unspecified organism: J18.9

## 2013-02-13 HISTORY — DX: Gastro-esophageal reflux disease without esophagitis: K21.9

## 2013-02-13 LAB — COMPREHENSIVE METABOLIC PANEL
ALT: 45 U/L (ref 0–53)
AST: 34 U/L (ref 0–37)
Albumin: 3.9 g/dL (ref 3.5–5.2)
Alkaline Phosphatase: 162 U/L — ABNORMAL HIGH (ref 39–117)
Calcium: 9 mg/dL (ref 8.4–10.5)
Chloride: 100 mEq/L (ref 96–112)
Glucose, Bld: 126 mg/dL — ABNORMAL HIGH (ref 70–99)
Potassium: 3.5 mEq/L (ref 3.5–5.1)
Sodium: 134 mEq/L — ABNORMAL LOW (ref 135–145)
Total Protein: 8.1 g/dL (ref 6.0–8.3)

## 2013-02-13 LAB — URINALYSIS, ROUTINE W REFLEX MICROSCOPIC
Bilirubin Urine: NEGATIVE
Glucose, UA: NEGATIVE mg/dL
Hgb urine dipstick: NEGATIVE
Ketones, ur: NEGATIVE mg/dL
Nitrite: NEGATIVE
Protein, ur: NEGATIVE mg/dL
Specific Gravity, Urine: 1.022 (ref 1.005–1.030)
Urobilinogen, UA: 0.2 mg/dL (ref 0.0–1.0)
pH: 5.5 (ref 5.0–8.0)

## 2013-02-13 LAB — CBC WITH DIFFERENTIAL/PLATELET
Basophils Absolute: 0 10*3/uL (ref 0.0–0.1)
Basophils Relative: 1 % (ref 0–1)
Eosinophils Absolute: 0.1 10*3/uL (ref 0.0–0.7)
Eosinophils Relative: 1 % (ref 0–5)
HCT: 42.8 % (ref 39.0–52.0)
Hemoglobin: 15 g/dL (ref 13.0–17.0)
Lymphocytes Relative: 53 % — ABNORMAL HIGH (ref 12–46)
Lymphs Abs: 2.9 10*3/uL (ref 0.7–4.0)
MCH: 30.8 pg (ref 26.0–34.0)
MCHC: 35 g/dL (ref 30.0–36.0)
MCV: 87.9 fL (ref 78.0–100.0)
Monocytes Absolute: 0.5 K/uL (ref 0.1–1.0)
Monocytes Relative: 9 % (ref 3–12)
Neutro Abs: 2 K/uL (ref 1.7–7.7)
Neutrophils Relative %: 37 % — ABNORMAL LOW (ref 43–77)
Platelets: 216 10*3/uL (ref 150–400)
RBC: 4.87 MIL/uL (ref 4.22–5.81)
RDW: 12.8 % (ref 11.5–15.5)
WBC: 5.5 K/uL (ref 4.0–10.5)

## 2013-02-13 LAB — COMPREHENSIVE METABOLIC PANEL WITH GFR
BUN: 10 mg/dL (ref 6–23)
CO2: 23 meq/L (ref 19–32)
Creatinine, Ser: 0.84 mg/dL (ref 0.50–1.35)
GFR calc Af Amer: >90 mL/min (ref >90)
GFR calc non Af Amer: >90 mL/min (ref >90)
Total Bilirubin: 0.2 mg/dL — ABNORMAL LOW (ref 0.3–1.2)

## 2013-02-13 LAB — LIPASE, BLOOD: Lipase: 25 U/L (ref 11–59)

## 2013-02-13 LAB — URINE MICROSCOPIC-ADD ON

## 2013-02-13 MED ORDER — ONDANSETRON 4 MG PO TBDP
8.0000 mg | ORAL_TABLET | Freq: Once | ORAL | Status: AC
  Administered 2013-02-13: 8 mg via ORAL
  Filled 2013-02-13: qty 2

## 2013-02-13 NOTE — ED Notes (Signed)
Pt. reports emesis ( x3) , dizziness and mid abdominal pain onset yesterday , denies diarrhea , no fever or chills. Pt.is deaf/mute - wrote his complaints on a paper at triage .

## 2013-02-13 NOTE — Telephone Encounter (Signed)
Patient called and advised he is feeling worse than before. He advised his stomach is hurting and he can not eat or drink without pain. I tried to give him an appt for tomorrow and he advised he does not think he can wait and he wants to go the the ED. Advised him he can certainly do that if he feels that bad but if he changes him mind to call back and we can try to get him in to see someone tomorrow. Also advised him will let the doctor know what's going on with him.

## 2013-02-13 NOTE — Telephone Encounter (Signed)
Pt does not have transportation during the day only after 5 pm.  May not be able to keep appt on Monday but said he would call to cancel.

## 2013-02-13 NOTE — ED Notes (Signed)
Pt c/o substernal burning particularly after eating

## 2013-02-13 NOTE — ED Provider Notes (Signed)
CSN: [id number]     Arrival date & time 02/13/13  1906 History   First MD Initiated Contact with Patient 02/13/13 2205     Chief Complaint  Patient presents with  . Emesis   (Consider location/radiation/quality/duration/timing/severity/associated sxs/prior Treatment) Patient is a 32 y.o. male presenting with vomiting. The history is provided by the patient and medical records. No language interpreter was used.  Emesis Associated symptoms: abdominal pain ( Epigastric)   Associated symptoms: no diarrhea     Matthew Fitzpatrick is a 32 y.o. male  with a hx of hearing impairment, CD 4 count of 860/VL<20, on truvada/DRVr presents to the Emergency Department complaining of gradual, waxing and waning epigastric pain with associated nausea onset yesterday afternoon. Patient reports this afternoon and nausea and epigastric pain returned and resulted in 3 bouts of emesis.  Patient was seen at his PCP on 02/05/2013 for which he described as profuse diarrhea with associated generalized abdominal pain and cramping. He was discharged from that appointment with Flagyl and Percocet.  He reports he began taking his medications the afternoon then symptoms resolved. He reports normal bowel movements since that time.  Patient expresses significant concern that his metronidazole is interacting with his HIV medications.  He does report that he took both the Percocet and the metronidazole on an empty stomach both yesterday and today. He denies fever, chills, headache, neck pain, chest pain, shortness of breath, diarrhea, weakness, dizziness, syncope, dysuria, hematuria.  Nothing seems to make his symptoms better or worse.   Past Medical History  Diagnosis Date  . Deaf   . HIV disease   . GERD (gastroesophageal reflux disease)   . CAP (community acquired pneumonia)    History reviewed. No pertinent past surgical history. No family history on file. History  Substance Use Topics  . Smoking status: Never Smoker   .  Smokeless tobacco: Never Used  . Alcohol Use: No    Review of Systems  Constitutional: Negative for fever, diaphoresis, appetite change, fatigue and unexpected weight change.  HENT: Negative for mouth sores and trouble swallowing.   Respiratory: Negative for cough, chest tightness, shortness of breath, wheezing and stridor.   Cardiovascular: Negative for chest pain and palpitations.  Gastrointestinal: Positive for nausea, vomiting and abdominal pain ( Epigastric). Negative for diarrhea, constipation, blood in stool, abdominal distention and rectal pain.  Genitourinary: Negative for dysuria, urgency, frequency, hematuria, flank pain and difficulty urinating.  Musculoskeletal: Negative for back pain, neck pain and neck stiffness.  Skin: Negative for rash.  Neurological: Negative for weakness.  Hematological: Negative for adenopathy.  Psychiatric/Behavioral: Negative for confusion.  All other systems reviewed and are negative.    Allergies  Review of patient's allergies indicates no active allergies.  Home Medications   Current Outpatient Rx  Name  Route  Sig  Dispense  Refill  . Darunavir Ethanolate (PREZISTA) 800 MG tablet   Oral   Take 1 tablet (800 mg total) by mouth daily.   30 tablet   6   . emtricitabine-tenofovir (TRUVADA) 200-300 MG per tablet   Oral   Take 1 tablet by mouth daily.   30 tablet   6   . metroNIDAZOLE (FLAGYL) 500 MG tablet   Oral   Take 1 tablet (500 mg total) by mouth 3 (three) times daily.   30 tablet   0   . omeprazole (PRILOSEC) 20 MG capsule   Oral   Take 1 capsule (20 mg total) by mouth daily.   30 capsule  6   . oxyCODONE-acetaminophen (ROXICET) 5-325 MG per tablet   Oral   Take 1 tablet by mouth every 4 (four) hours as needed for severe pain.   30 tablet   0   . ritonavir (NORVIR) 100 MG TABS tablet   Oral   Take 1 tablet (100 mg total) by mouth daily.   30 tablet   6   . ondansetron (ZOFRAN ODT) 8 MG disintegrating tablet       34m ODT q4 hours prn nausea   8 tablet   0    BP 144/85  Pulse 89  Temp(Src) 98 F (36.7 C) (Oral)  Resp 20  Wt 212 lb 4 oz (96.276 kg)  SpO2 98% Physical Exam  Nursing note and vitals reviewed. Constitutional: He is oriented to person, place, and time. He appears well-developed and well-nourished.  HENT:  Head: Normocephalic and atraumatic.  Mouth/Throat: Oropharynx is clear and moist.  Eyes: Conjunctivae are normal. Pupils are equal, round, and reactive to light. No scleral icterus.  Neck: Normal range of motion.  Cardiovascular: Normal rate, regular rhythm, S1 normal, S2 normal, normal heart sounds and intact distal pulses.   No murmur heard. Pulses:      Radial pulses are 2+ on the right side, and 2+ on the left side.       Dorsalis pedis pulses are 2+ on the right side, and 2+ on the left side.       Posterior tibial pulses are 2+ on the right side, and 2+ on the left side.  No tachycardia Capillary refill less than 3 seconds  Pulmonary/Chest: Effort normal and breath sounds normal. No respiratory distress. He has no wheezes. He has no rales. He exhibits no tenderness.  Clear and equal breath sounds  Abdominal: Soft. Normal appearance and bowel sounds are normal. He exhibits no distension and no mass. There is tenderness in the epigastric area. There is no rebound, no guarding and no CVA tenderness.  Patient with mild epigastric tenderness No rebound or peritoneal signs Abdomen soft No CVA tenderness  Musculoskeletal: Normal range of motion. He exhibits no tenderness.  Lymphadenopathy:    He has no cervical adenopathy.  Neurological: He is alert and oriented to person, place, and time. He exhibits normal muscle tone. Coordination normal.  Skin: Skin is warm and dry. No rash noted. No erythema.  Psychiatric: He has a normal mood and affect. His behavior is normal.    ED Course  Procedures (including critical care time) Labs Review Labs Reviewed  CBC WITH  DIFFERENTIAL - Abnormal; Notable for the following:    Neutrophils Relative % 37 (*)    Lymphocytes Relative 53 (*)    All other components within normal limits  COMPREHENSIVE METABOLIC PANEL - Abnormal; Notable for the following:    Sodium 134 (*)    Glucose, Bld 126 (*)    Alkaline Phosphatase 162 (*)    Total Bilirubin 0.2 (*)    All other components within normal limits  URINALYSIS, ROUTINE W REFLEX MICROSCOPIC - Abnormal; Notable for the following:    APPearance CLOUDY (*)    Leukocytes, UA TRACE (*)    All other components within normal limits  LIPASE, BLOOD  URINE MICROSCOPIC-ADD ON   Imaging Review No results found.  EKG Interpretation   None       MDM   1. Epigastric abdominal pain      VRALF KONOPKApresents with Hx of HIV (well controlled) and 3 episodes of  nausea and vomiting today.  UA with trace leukocytes but no evidence of urinary tract infection, CBC without leukocytosis and CMP unremarkable.  Lipase within normal limits.  Vital signs have been stable here with no fever, retention or tachycardia.  Will give Zofran and reassess.  12:19 AM Pt feeling much better will PO trial.    12:31 AM Patient is nontoxic, nonseptic appearing, in no apparent distress.  Patient's pain and other symptoms adequately managed in emergency department.  Labs and vitals reviewed.  Patient does not meet the SIRS or Sepsis criteria.  On repeat exam patient does not have a surgical abdomin and there are no peritoneal signs.  No indication of appendicitis, bowel obstruction, bowel perforation, cholecystitis, diverticulitis.  Patient tolerating by mouth without difficulty.  Patient discharged home with symptomatic treatment and given strict instructions for follow-up with their primary care physician Tomorrow at the clinic.  I have also discussed reasons to return immediately to the ER.  Patient expresses understanding and agrees with plan.  It has been determined that no acute  conditions requiring further emergency intervention are present at this time. The patient/guardian have been advised of the diagnosis and plan. We have discussed signs and symptoms that warrant return to the ED, such as changes or worsening in symptoms.   Vital signs are stable at discharge.   BP 144/85  Pulse 89  Temp(Src) 98 F (36.7 C) (Oral)  Resp 20  Wt 212 lb 4 oz (96.276 kg)  SpO2 98%  Patient/guardian has voiced understanding and agreed to follow-up with the PCP or specialist.         Abigail Butts, PA-C 02/14/13 0032

## 2013-02-14 ENCOUNTER — Other Ambulatory Visit: Payer: Self-pay | Admitting: *Deleted

## 2013-02-14 ENCOUNTER — Telehealth: Payer: Self-pay | Admitting: *Deleted

## 2013-02-14 MED ORDER — ONDANSETRON 8 MG PO TBDP: ORAL_TABLET | ORAL | Status: DC

## 2013-02-14 NOTE — Telephone Encounter (Signed)
Pt called stating that he still felt nauseated, was concerned that he didn't get fluids or medications from the ED yesterday.  RN saw that he was prescribed zofran, patient states he didn't yet fill it.  RN advised him to go ahead and fill the zofran.  Per Darnelle Maffucci at [place], he should be able to get it for ~$3 using his medicare/medicaid. Pt verbalized understanding.  Patient states he may not have transportation for Monday's follow up appointment, states that Langley Gauss arranges transportation for him - requested her voicemail for this.  Landis Gandy, RN

## 2013-02-15 NOTE — ED Provider Notes (Signed)
Medical screening examination/treatment/procedure(s) were performed by non-physician practitioner and as supervising physician I was immediately available for consultation/collaboration.   Saddie Benders. Dorna Mai, MD 02/15/13 [phone]

## 2013-02-17 ENCOUNTER — Telehealth: Payer: Self-pay | Admitting: *Deleted

## 2013-02-17 ENCOUNTER — Ambulatory Visit: Payer: Medicare Other | Admitting: Infectious Disease

## 2013-02-17 NOTE — Telephone Encounter (Signed)
Patient has finished the flagyl and stopped the pain medication, states that the nausea also resolved when he stopped those meds.  Pt cancelled his appointment for today. Landis Gandy, RN

## 2013-03-18 ENCOUNTER — Other Ambulatory Visit: Payer: Medicare Other

## 2013-03-19 ENCOUNTER — Telehealth: Payer: Self-pay | Admitting: *Deleted

## 2013-03-19 NOTE — Telephone Encounter (Signed)
Patient called to advise that he needs transportation to his appts and that we need to provide it. Advised him we do not provide transportation but gave him number to [facility] [employer] [phone] advised him to call them and see what services they have and then call back for an appt. He is concerned that he is not going to get in in time to renew with [facility] advised him he has until end of February to do so. He changed his 04/01/13 appt just in case and will call back once he calls [facility].

## 2013-03-20 ENCOUNTER — Telehealth: Payer: Self-pay | Admitting: *Deleted

## 2013-03-20 NOTE — Telephone Encounter (Signed)
Patient called and advised he is having some pressure in his chest. Advised this happens a lot. I advised him he would need to call 911 if he feels that bad. He does not think it is a heart attack he thinks it is stress. I asked the patient if he is eating or doing anything just prior to the pain and pressure and he advised not eating but watching TV or arguing with a friend.The patient advised he thinks it is stress. Advised the patient to keep a log of when the episodes happen and what hs is doing or eating just before or during and bring it in to discuss with his doctor at his next visit. He advised he will do so. I also advised him if he feels the pressure with any left arm pain or shortness of breath to call 911 immediately.

## 2013-04-01 ENCOUNTER — Ambulatory Visit: Payer: Medicare Other | Admitting: Internal Medicine

## 2013-04-10 ENCOUNTER — Ambulatory Visit: Payer: Medicare Other | Admitting: Internal Medicine

## 2013-04-30 ENCOUNTER — Ambulatory Visit: Payer: Medicare Other

## 2013-04-30 ENCOUNTER — Telehealth: Payer: Self-pay | Admitting: *Deleted

## 2013-04-30 NOTE — Telephone Encounter (Signed)
Patient called to advise that he does not have a ride to clinic and needs to cancel his appt

## 2013-05-06 ENCOUNTER — Encounter: Payer: Self-pay | Admitting: *Deleted

## 2013-05-06 ENCOUNTER — Ambulatory Visit: Payer: Medicare Other

## 2013-05-07 ENCOUNTER — Other Ambulatory Visit: Payer: Self-pay | Admitting: *Deleted

## 2013-05-07 DIAGNOSIS — B20 Human immunodeficiency virus [HIV] disease: Secondary | ICD-10-CM

## 2013-05-07 MED ORDER — DARUNAVIR ETHANOLATE 800 MG PO TABS: 800.0000 mg | ORAL_TABLET | Freq: Every day | ORAL | Status: DC

## 2013-05-07 MED ORDER — EMTRICITABINE-TENOFOVIR DF 200-300 MG PO TABS: 1.0000 | ORAL_TABLET | Freq: Every day | ORAL | Status: DC

## 2013-05-07 MED ORDER — RITONAVIR 100 MG PO TABS: 100.0000 mg | ORAL_TABLET | Freq: Every day | ORAL | Status: DC

## 2013-05-16 ENCOUNTER — Telehealth: Payer: Self-pay | Admitting: *Deleted

## 2013-05-16 NOTE — Telephone Encounter (Signed)
Approval may take 3 weeks or more.  You may refill your medications through the end of March.  We will let you know when we receive the approval.  You will also receive a letter.

## 2013-05-23 ENCOUNTER — Telehealth: Payer: Self-pay | Admitting: *Deleted

## 2013-05-23 NOTE — Telephone Encounter (Signed)
Patient was in for financial counseling with [facility], reported difficulty getting medications delivered regularly.  Pt stated via interpreter that his deliveries were sometime 3-6 days late each month.  Pt reported difficulty with the monthly phone calls from the delivery call center - he was not always available to receive their calls, he was not reliably calling for his own refills. RN suggested that the patient switch to our local [facility] at [address], as Duane Boston, Conservation officer, nature, would be able to mail the rx each month.  Patient has a stable address.  Confirmed plan with Darnelle Maffucci.  Patient's rx moved to this store on 05/23/13.  His SPAP was approved per Rob, pharmacist.  RN confirmed address.  Pharmacy preference updated in chart. Landis Gandy, RN

## 2013-06-20 ENCOUNTER — Telehealth: Payer: Self-pay | Admitting: *Deleted

## 2013-06-20 NOTE — Telephone Encounter (Signed)
Patient is in [place] and had a question on his medications. He states he was previously on 4 medications and now only has 3. Read the Prezista, Truvada, and Norvir and he has those with him. He does not have the Prilosec and told him it looked like his refills expired. He will call us when he gets back if he needs this medication. He said he has not been having any problems with Jerrye Bushy.

## 2013-07-22 ENCOUNTER — Telehealth: Payer: Self-pay | Admitting: *Deleted

## 2013-07-22 NOTE — Telephone Encounter (Signed)
Cough for 2 weeks.  Tried some things from [place].  Still coughing.  Offered pt an appt for tomorrow.  Pt did not think it necessary to come for appt.  RN asked the pt whether he had taken Mucinex/Robitussin/guefinesin.  Pt stated that he had not.  RN advised the pt to purchase the generic and take per the package instructions.  Pt stated that he would.

## 2013-08-11 ENCOUNTER — Ambulatory Visit: Payer: Medicare Other | Admitting: Internal Medicine

## 2013-08-12 ENCOUNTER — Encounter: Payer: Self-pay | Admitting: Internal Medicine

## 2013-08-12 ENCOUNTER — Ambulatory Visit (INDEPENDENT_AMBULATORY_CARE_PROVIDER_SITE_OTHER): Payer: Medicare Other | Admitting: Internal Medicine

## 2013-08-12 VITALS — BP 133/78 | HR 69 | Temp 98.2°F | Wt 209.0 lb

## 2013-08-12 DIAGNOSIS — Z79899 Other long term (current) drug therapy: Secondary | ICD-10-CM

## 2013-08-12 DIAGNOSIS — B20 Human immunodeficiency virus [HIV] disease: Secondary | ICD-10-CM | POA: Diagnosis not present

## 2013-08-12 LAB — COMPREHENSIVE METABOLIC PANEL
ALT: 18 U/L (ref 0–53)
AST: 20 U/L (ref 0–37)
Albumin: 4.1 g/dL (ref 3.5–5.2)
Alkaline Phosphatase: 133 U/L — ABNORMAL HIGH (ref 39–117)
BUN: 9 mg/dL (ref 6–23)
CO2: 25 mEq/L (ref 19–32)
CREATININE: 1.18 mg/dL (ref 0.50–1.35)
Calcium: 9.4 mg/dL (ref 8.4–10.5)
Chloride: 103 mEq/L (ref 96–112)
Glucose, Bld: 80 mg/dL (ref 70–99)
Potassium: 3.9 mEq/L (ref 3.5–5.3)
Sodium: 137 mEq/L (ref 135–145)
Total Bilirubin: 0.3 mg/dL (ref 0.2–1.2)
Total Protein: 7.7 g/dL (ref 6.0–8.3)

## 2013-08-12 LAB — CBC
HEMATOCRIT: 43.1 % (ref 39.0–52.0)
Hemoglobin: 14.7 g/dL (ref 13.0–17.0)
MCH: 30.8 pg (ref 26.0–34.0)
MCHC: 34.1 g/dL (ref 30.0–36.0)
MCV: 90.2 fL (ref 78.0–100.0)
PLATELETS: 224 10*3/uL (ref 150–400)
RBC: 4.78 MIL/uL (ref 4.22–5.81)
RDW: 13.8 % (ref 11.5–15.5)
WBC: 6.2 10*3/uL (ref 4.0–10.5)

## 2013-08-12 MED ORDER — DARUNAVIR-COBICISTAT 800-150 MG PO TABS: 1.0000 | ORAL_TABLET | Freq: Every day | ORAL | Status: DC

## 2013-08-12 NOTE — Progress Notes (Signed)
Patient ID: Matthew Fitzpatrick, male   DOB: June 18, 1980, 33 y.o.   MRN: [id number]          Patient Active Problem List   Diagnosis Date Noted  . HIV DISEASE 03/02/2006    Priority: High  . Diarrhea 07/02/2012  . Dyslipidemia 07/02/2012  . GERD (gastroesophageal reflux disease) 06/28/2010  . 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 ANXIETY 03/02/2006  . DEPRESSION 03/02/2006  . ALLERGIC RHINITIS 03/02/2006    Patient's Medications  New Prescriptions   DARUNAVIR-COBICISTAT (PREZCOBIX) 800-150 MG PER TABLET    Take 1 tablet by mouth daily. Swallow whole. Do NOT crush, break or chew tablets. Take with food.  Previous Medications   DEXTROMETHORPHAN-GUAIFENESIN (MUCINEX DM) 30-600 MG PER 12 HR TABLET    Take 1 tablet by mouth 2 (two) times daily.   EMTRICITABINE-TENOFOVIR (TRUVADA) 200-300 MG PER TABLET    Take 1 tablet by mouth daily.  Modified Medications   No medications on file  Discontinued Medications   DARUNAVIR ETHANOLATE (PREZISTA) 800 MG TABLET    Take 1 tablet (800 mg total) by mouth daily.   METRONIDAZOLE (FLAGYL) 500 MG TABLET    Take 1 tablet (500 mg total) by mouth 3 (three) times daily.   OMEPRAZOLE (PRILOSEC) 20 MG CAPSULE    Take 1 capsule (20 mg total) by mouth daily.   ONDANSETRON (ZOFRAN ODT) 8 MG DISINTEGRATING TABLET    28m ODT q4 hours prn nausea   OXYCODONE-ACETAMINOPHEN (ROXICET) 5-325 MG PER TABLET    Take 1 tablet by mouth every 4 (four) hours as needed for severe pain.   RITONAVIR (NORVIR) 100 MG TABS TABLET    Take 1 tablet (100 mg total) by mouth daily.    Subjective: Matthew Fitzpatrick is in for his routine visit. He has had some problems with mild, chronic cough over the past 2 months. He does not think it related to seasonal allergies although he has had some symptoms of allergies in past years. He is not taking his proton pump inhibitor but denies having any acid reflux symptoms at this time. He has not had any fever, sputum  production or shortness of breath. He gets relief of his cough with Mucinex DM.  He denies missing any doses of his Truvada, Prezista or Norvir. His mail-order pharmacy has been a little late delivery in his medications on several occasions but he's never had to miss doses.  Review of Systems: Pertinent items are noted in HPI.  Past Medical History  Diagnosis Date  . Deaf   . HIV disease   . GERD (gastroesophageal reflux disease)   . CAP (community acquired pneumonia)     History  Substance Use Topics  . Smoking status: Never Smoker   . Smokeless tobacco: Never Used  . Alcohol Use: No    No family history on file.  No Active Allergies  Objective: Temp: 98.2 F (36.8 C) (06/16 1550) Temp src: Oral (06/16 1550) BP: 133/78 mmHg (06/16 1550) Pulse Rate: 69 (06/16 1550) Body mass index is 30.85 kg/(m^2).  General: He is in no distress Oral: No oropharyngeal lesions Skin: No rash Lungs: Clear Cor: Regular S1 and S2 no murmurs Abdomen: Soft, obese and nontender  Lab Results Lab Results  Component Value Date   WBC 5.5 02/13/2013   HGB 15.0 02/13/2013   HCT 42.8 02/13/2013   MCV 87.9 02/13/2013   PLT 216 02/13/2013    Lab Results  Component Value Date   CREATININE  0.84 02/13/2013   BUN 10 02/13/2013   NA 134* 02/13/2013   K 3.5 02/13/2013   CL 100 02/13/2013   CO2 23 02/13/2013    Lab Results  Component Value Date   ALT 45 02/13/2013   AST 34 02/13/2013   ALKPHOS 162* 02/13/2013   BILITOT 0.2* 02/13/2013    Lab Results  Component Value Date   CHOL 237* 08/06/2012   HDL 43 08/06/2012   LDLCALC 162* 08/06/2012   TRIG 158* 08/06/2012   CHOLHDL 5.5 08/06/2012    Lab Results HIV 1 RNA Quant (copies/mL)  Date Value  08/06/2012 <20   02/01/2012 46*  08/01/2011 <20      CD4 T Cell Abs (cmm)  Date Value  08/06/2012 680   02/01/2012 440   08/01/2011 490      Assessment: We'll need to repeat blood work today but it sounds like his adherence remains very good  and I suspect that his HIV infection is still under good control. I will simplify his antiretroviral regimen to Truvada and Prezcobix.  Plan: 1. Simplify regimen to Truvada and Prezcobix 2. Check lab today 3. Followup in 6 months   Michel Bickers, MD [facility] for [place] [phone] pager   [phone] cell 08/12/2013, 5:00 PM

## 2013-08-13 LAB — RPR

## 2013-08-14 LAB — T-HELPER CELL (CD4) - (RCID CLINIC ONLY)
CD4 % Helper T Cell: 26 % — ABNORMAL LOW (ref 33–55)
CD4 T Cell Abs: 860 /uL (ref 400–2700)

## 2013-08-16 LAB — HIV-1 RNA QUANT-NO REFLEX-BLD
HIV 1 RNA Quant: 47 copies/mL — ABNORMAL HIGH (ref <20)
HIV-1 RNA Quant, Log: 1.67 {Log} — ABNORMAL HIGH (ref <1.30)

## 2013-10-01 ENCOUNTER — Ambulatory Visit: Payer: Medicare Other

## 2013-10-08 ENCOUNTER — Ambulatory Visit: Payer: Medicare Other

## 2013-10-16 ENCOUNTER — Ambulatory Visit: Payer: Medicare Other

## 2013-10-22 ENCOUNTER — Telehealth: Payer: Self-pay | Admitting: *Deleted

## 2013-10-22 NOTE — Telephone Encounter (Signed)
Patient called inquiring about his recertification status.  Patient states he recertified for SPAP, but [facility] needs proof.  Pt states the certification period ends 10/27/13. 7/29 prezcobix, truvada, omeprazole filled from [place] store. Will discuss this with Robert Bellow, [place].

## 2013-10-23 NOTE — Telephone Encounter (Signed)
Patient states he renewed his SPAP on 8/20.  Confirmed with [facility] SPAP pharmacy that patient is good through 9/30.

## 2013-10-27 ENCOUNTER — Other Ambulatory Visit: Payer: Self-pay | Admitting: *Deleted

## 2013-10-27 DIAGNOSIS — B20 Human immunodeficiency virus [HIV] disease: Secondary | ICD-10-CM

## 2013-10-27 MED ORDER — EMTRICITABINE-TENOFOVIR DF 200-300 MG PO TABS: 1.0000 | ORAL_TABLET | Freq: Every day | ORAL | Status: DC

## 2013-10-27 MED ORDER — DARUNAVIR-COBICISTAT 800-150 MG PO TABS: 1.0000 | ORAL_TABLET | Freq: Every day | ORAL | Status: DC

## 2013-10-27 NOTE — Telephone Encounter (Signed)
Pt having difficulty understanding about SPAP sign up for the next six months.  Left mesage for pt to call [facility] to set up appt to recertify for SPAP for the next 6 months.  Also, let the pt know that he has refills at the [facility] on [address] in [place].

## 2013-10-27 NOTE — Telephone Encounter (Signed)
Pt having difficulty with understanding SPAP re-certification process as well as obtaining refills at [facility] [place] [place].  Left message for the pt that his re-certification has been completed and refills sent to [facility].

## 2014-01-15 ENCOUNTER — Other Ambulatory Visit: Payer: Self-pay | Admitting: Internal Medicine

## 2014-01-29 ENCOUNTER — Other Ambulatory Visit: Payer: Medicare Other

## 2014-02-12 ENCOUNTER — Encounter: Payer: Self-pay | Admitting: Internal Medicine

## 2014-02-12 ENCOUNTER — Ambulatory Visit (INDEPENDENT_AMBULATORY_CARE_PROVIDER_SITE_OTHER): Payer: Medicare Other | Admitting: Internal Medicine

## 2014-02-12 VITALS — BP 127/79 | HR 80 | Temp 98.1°F | Wt 211.8 lb

## 2014-02-12 DIAGNOSIS — H1030 Unspecified acute conjunctivitis, unspecified eye: Secondary | ICD-10-CM

## 2014-02-12 DIAGNOSIS — B20 Human immunodeficiency virus [HIV] disease: Secondary | ICD-10-CM | POA: Diagnosis not present

## 2014-02-12 DIAGNOSIS — Z79899 Other long term (current) drug therapy: Secondary | ICD-10-CM | POA: Diagnosis not present

## 2014-02-12 DIAGNOSIS — Z113 Encounter for screening for infections with a predominantly sexual mode of transmission: Secondary | ICD-10-CM | POA: Diagnosis not present

## 2014-02-12 DIAGNOSIS — Z23 Encounter for immunization: Secondary | ICD-10-CM | POA: Diagnosis not present

## 2014-02-12 HISTORY — DX: Unspecified acute conjunctivitis, unspecified eye: H10.30

## 2014-02-12 LAB — CBC
HEMATOCRIT: 44.2 % (ref 39.0–52.0)
Hemoglobin: 15.5 g/dL (ref 13.0–17.0)
MCH: 31.1 pg (ref 26.0–34.0)
MCHC: 35.1 g/dL (ref 30.0–36.0)
MCV: 88.6 fL (ref 78.0–100.0)
MPV: 10.5 fL (ref 9.4–12.4)
Platelets: 255 10*3/uL (ref 150–400)
RBC: 4.99 MIL/uL (ref 4.22–5.81)
RDW: 13.6 % (ref 11.5–15.5)
WBC: 5.4 10*3/uL (ref 4.0–10.5)

## 2014-02-12 NOTE — Progress Notes (Signed)
Patient ID: Matthew Fitzpatrick, male   DOB: 11/24/1980, 32 y.o.   MRN: [id number]          Patient Active Problem List   Diagnosis Date Noted  . Human immunodeficiency virus (HIV) disease 03/02/2006    Priority: High  . Acute conjunctivitis 02/12/2014  . Dyslipidemia 07/02/2012  . GERD (gastroesophageal reflux disease) 06/28/2010  . 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 ANXIETY 03/02/2006  . DEPRESSION 03/02/2006  . ALLERGIC RHINITIS 03/02/2006    Patient's Medications  New Prescriptions   No medications on file  Previous Medications   DARUNAVIR-COBICISTAT (PREZCOBIX) 800-150 MG PER TABLET    Take 1 tablet by mouth daily. Swallow whole. Do NOT crush, break or chew tablets. Take with food.   DEXTROMETHORPHAN-GUAIFENESIN (MUCINEX DM) 30-600 MG PER 12 HR TABLET    Take 1 tablet by mouth 2 (two) times daily.   EMTRICITABINE-TENOFOVIR (TRUVADA) 200-300 MG PER TABLET    Take 1 tablet by mouth daily.   OMEPRAZOLE (PRILOSEC) 20 MG CAPSULE    TAKE 1 CAPSULE BY MOUTH DAILY  Modified Medications   No medications on file  Discontinued Medications   No medications on file    Subjective: Matthew Fitzpatrick is in for his routine visit. He states that he's had no problems obtaining his medications and does not believe he is missed any doses. He is using his pillbox. He continues to take Truvada and Prezcobix. He is also still on Prilosec for his GERD. He states that he needs that to control his symptoms. He developed some irritation and redness of his left eye 2 days ago after staying up all night playing with his new PlayStation 3. His vision remains normal however. He does not wear contacts and has no known trauma to his eye. He has not been around anybody that he knows of with pinkeye. He feels he is getting better.  Review of Systems: Constitutional: negative Eyes: as noted in history of present illness Ears, nose, mouth, throat, and face:  negative Respiratory: negative Cardiovascular: negative Gastrointestinal: positive for reflux symptoms, negative for abdominal pain, constipation, diarrhea, nausea, odynophagia and vomiting Genitourinary:negative  Past Medical History  Diagnosis Date  . Deaf   . HIV disease   . GERD (gastroesophageal reflux disease)   . CAP (community acquired pneumonia)     History  Substance Use Topics  . Smoking status: Never Smoker   . Smokeless tobacco: Never Used  . Alcohol Use: No    No family history on file.  No Active Allergies  Objective: Temp: 98.1 F (36.7 C) (12/17 1506) Temp Source: Oral (12/17 1506) BP: 127/79 mmHg (12/17 1506) Pulse Rate: 80 (12/17 1506) Body mass index is 31.26 kg/(m^2).  General: He is in good spirits Oral: No oropharyngeal lesions Skin: No rash Lungs: Clear Cor: Regular S1 and S2 no murmurs Abdomen: Soft and nontender Joints and extremities: Normal Neuro: Alert with normal speech and conversation. Normal gait. Mood and affect: Normal. He does not appear depressed or anxious  Lab Results Lab Results  Component Value Date   WBC 6.2 08/12/2013   HGB 14.7 08/12/2013   HCT 43.1 08/12/2013   MCV 90.2 08/12/2013   PLT 224 08/12/2013    Lab Results  Component Value Date   CREATININE 1.18 08/12/2013   BUN 9 08/12/2013   NA 137 08/12/2013   K 3.9 08/12/2013   CL 103 08/12/2013   CO2 25 08/12/2013    Lab Results  Component Value  Date   ALT 18 08/12/2013   AST 20 08/12/2013   ALKPHOS 133* 08/12/2013   BILITOT 0.3 08/12/2013    Lab Results  Component Value Date   CHOL 237* 08/06/2012   HDL 43 08/06/2012   LDLCALC 162* 08/06/2012   TRIG 158* 08/06/2012   CHOLHDL 5.5 08/06/2012    Lab Results HIV 1 RNA QUANT (copies/mL)  Date Value  08/12/2013 47*  08/06/2012 <20  02/01/2012 46*   CD4 T CELL ABS  Date Value  08/12/2013 860 /uL  08/06/2012 680 cmm  02/01/2012 440 cmm     Assessment: He had very mild viral activation 6  months ago but it sounds like his adherence remains excellent. I will continue his current antiretroviral regimen and repeat lab work today.  I will continue his Prilosec.  He needs a follow-up lipid panel. He has had dyslipidemia.  He probably has some viral pinkeye. I talked to him about how to manage it symptomatically and use good hand hygiene.  Plan: 1. Continue current medications 2. Repeat lab work today 3. Follow-up in 6 months   Matthew Bickers, MD [facility] for [place] [phone] pager   [phone] cell 02/12/2014, 3:29 PM

## 2014-02-13 LAB — COMPREHENSIVE METABOLIC PANEL
ALT: 21 U/L (ref 0–53)
AST: 21 U/L (ref 0–37)
Albumin: 4.1 g/dL (ref 3.5–5.2)
Alkaline Phosphatase: 133 U/L — ABNORMAL HIGH (ref 39–117)
BILIRUBIN TOTAL: 0.4 mg/dL (ref 0.2–1.2)
BUN: 10 mg/dL (ref 6–23)
CALCIUM: 9.2 mg/dL (ref 8.4–10.5)
CO2: 25 meq/L (ref 19–32)
CREATININE: 0.95 mg/dL (ref 0.50–1.35)
Chloride: 101 mEq/L (ref 96–112)
GLUCOSE: 95 mg/dL (ref 70–99)
Potassium: 3.8 mEq/L (ref 3.5–5.3)
Sodium: 135 mEq/L (ref 135–145)
Total Protein: 7.9 g/dL (ref 6.0–8.3)

## 2014-02-13 LAB — LIPID PANEL
CHOL/HDL RATIO: 5.8 ratio
CHOLESTEROL: 239 mg/dL — AB (ref 0–200)
HDL: 41 mg/dL (ref >39)
LDL Cholesterol: 156 mg/dL — ABNORMAL HIGH (ref 0–99)
Triglycerides: 208 mg/dL — ABNORMAL HIGH (ref <150)
VLDL: 42 mg/dL — ABNORMAL HIGH (ref 0–40)

## 2014-02-13 LAB — T-HELPER CELL (CD4) - (RCID CLINIC ONLY)
CD4 % Helper T Cell: 27 % — ABNORMAL LOW (ref 33–55)
CD4 T Cell Abs: 980 /uL (ref 400–2700)

## 2014-02-13 LAB — RPR

## 2014-02-15 LAB — HIV-1 RNA QUANT-NO REFLEX-BLD: HIV-1 RNA Quant, Log: <1.3 {Log} (ref <1.30)

## 2014-03-05 ENCOUNTER — Telehealth: Payer: Self-pay | Admitting: *Deleted

## 2014-03-05 NOTE — Telephone Encounter (Signed)
Patient called (via interpreter), left message asking for advice. He reports his copay for medications is increasing, he is worried he will need to switch for something more affordable.  Pt has medicare with SPAP.  Forwarded the call to [facility] for advice. Landis Gandy, RN

## 2014-03-10 ENCOUNTER — Telehealth: Payer: Self-pay | Admitting: *Deleted

## 2014-03-10 NOTE — Telephone Encounter (Signed)
Patient called, stating he has not been able to speak with anyone for months.  He is having a problem with his SPAP, cost of medications, decrease in SSI.  Patient now asking to speak with Langley Gauss to follow up on a conversation he has had with her recently. Patient's last call documented in chart was 1/7, was given to [facility] for follow up by this RN.  Today's message forwarded to [facility] at patient's request.  Landis Gandy, RN

## 2014-03-12 NOTE — Telephone Encounter (Signed)
Left message.  Pt has Financial Counseling appt 03/25/14.  Encouraged pt to write down all of his questions and concerns to bring to his SPAP Renewal appt on 03/25/14.  The Development worker, community along with the [place] Case Manager should be able to answer all of his questions.

## 2014-03-25 ENCOUNTER — Ambulatory Visit: Payer: Medicare Other

## 2014-03-27 ENCOUNTER — Other Ambulatory Visit: Payer: Self-pay | Admitting: *Deleted

## 2014-03-27 DIAGNOSIS — B20 Human immunodeficiency virus [HIV] disease: Secondary | ICD-10-CM

## 2014-03-27 MED ORDER — EMTRICITABINE-TENOFOVIR DF 200-300 MG PO TABS: 1.0000 | ORAL_TABLET | Freq: Every day | ORAL | Status: DC

## 2014-03-27 MED ORDER — DARUNAVIR-COBICISTAT 800-150 MG PO TABS: 1.0000 | ORAL_TABLET | Freq: Every day | ORAL | Status: DC

## 2014-03-31 ENCOUNTER — Telehealth: Payer: Self-pay | Admitting: *Deleted

## 2014-03-31 NOTE — Telephone Encounter (Signed)
Left message for the pt that this RN will be on the phones tomorrow and he can call back then to talk with me.

## 2014-05-20 ENCOUNTER — Other Ambulatory Visit: Payer: Self-pay | Admitting: *Deleted

## 2014-05-20 DIAGNOSIS — K219 Gastro-esophageal reflux disease without esophagitis: Secondary | ICD-10-CM

## 2014-05-20 MED ORDER — OMEPRAZOLE 20 MG PO CPDR: 20.0000 mg | DELAYED_RELEASE_CAPSULE | Freq: Every day | ORAL | Status: DC

## 2014-05-20 NOTE — Telephone Encounter (Signed)
Pt worried about increasing Medicare costs.  RN spoke with [place] in [place].  [facility] checked his charges for his medications and he should not be receiving any costs for his mreciations due to [employer] and SPAP coverage.  RN left this message for him.

## 2014-08-13 ENCOUNTER — Ambulatory Visit: Payer: Medicare Other | Admitting: Internal Medicine

## 2014-08-17 ENCOUNTER — Ambulatory Visit: Payer: Medicare Other | Admitting: Internal Medicine

## 2014-09-08 ENCOUNTER — Ambulatory Visit: Payer: Medicare Other | Admitting: Internal Medicine

## 2014-09-22 ENCOUNTER — Encounter: Payer: Self-pay | Admitting: Internal Medicine

## 2014-09-22 ENCOUNTER — Ambulatory Visit (INDEPENDENT_AMBULATORY_CARE_PROVIDER_SITE_OTHER): Payer: Medicare Other | Admitting: Internal Medicine

## 2014-09-22 VITALS — BP 121/78 | HR 72 | Temp 98.2°F | Wt 227.0 lb

## 2014-09-22 DIAGNOSIS — B20 Human immunodeficiency virus [HIV] disease: Secondary | ICD-10-CM

## 2014-09-22 DIAGNOSIS — K219 Gastro-esophageal reflux disease without esophagitis: Secondary | ICD-10-CM | POA: Diagnosis present

## 2014-09-22 DIAGNOSIS — Z79899 Other long term (current) drug therapy: Secondary | ICD-10-CM | POA: Diagnosis not present

## 2014-09-22 LAB — LIPID PANEL
Cholesterol: 218 mg/dL — ABNORMAL HIGH (ref 125–200)
HDL: 31 mg/dL — ABNORMAL LOW (ref >=40)
LDL CALC: 144 mg/dL — AB (ref <130)
TRIGLYCERIDES: 215 mg/dL — AB (ref <150)
Total CHOL/HDL Ratio: 7 Ratio — ABNORMAL HIGH (ref <=5.0)
VLDL: 43 mg/dL — ABNORMAL HIGH (ref <30)

## 2014-09-22 LAB — CBC
HCT: 43.5 % (ref 39.0–52.0)
Hemoglobin: 15.2 g/dL (ref 13.0–17.0)
MCH: 31.1 pg (ref 26.0–34.0)
MCHC: 34.9 g/dL (ref 30.0–36.0)
MCV: 89 fL (ref 78.0–100.0)
MPV: 10.2 fL (ref 8.6–12.4)
Platelets: 252 10*3/uL (ref 150–400)
RBC: 4.89 MIL/uL (ref 4.22–5.81)
RDW: 13.6 % (ref 11.5–15.5)
WBC: 6.8 10*3/uL (ref 4.0–10.5)

## 2014-09-22 LAB — COMPREHENSIVE METABOLIC PANEL
ALT: 19 U/L (ref 9–46)
AST: 20 U/L (ref 10–40)
Albumin: 4.1 g/dL (ref 3.6–5.1)
Alkaline Phosphatase: 128 U/L — ABNORMAL HIGH (ref 40–115)
BILIRUBIN TOTAL: 0.4 mg/dL (ref 0.2–1.2)
BUN: 11 mg/dL (ref 7–25)
CHLORIDE: 102 meq/L (ref 98–110)
CO2: 25 mEq/L (ref 20–31)
Calcium: 9.2 mg/dL (ref 8.6–10.3)
Creat: 1.17 mg/dL (ref 0.60–1.35)
Glucose, Bld: 98 mg/dL (ref 65–99)
Potassium: 3.8 mEq/L (ref 3.5–5.3)
SODIUM: 140 meq/L (ref 135–146)
TOTAL PROTEIN: 7.8 g/dL (ref 6.1–8.1)

## 2014-09-22 NOTE — Progress Notes (Signed)
Patient ID: Matthew Fitzpatrick, male   DOB: 11-26-1980, 34 y.o.   MRN: [id number]          Patient Active Problem List   Diagnosis Date Noted  . Human immunodeficiency virus (HIV) disease 03/02/2006    Priority: High  . Acute conjunctivitis 02/12/2014  . Dyslipidemia 07/02/2012  . GERD (gastroesophageal reflux disease) 06/28/2010  . 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 ANXIETY 03/02/2006  . DEPRESSION 03/02/2006  . ALLERGIC RHINITIS 03/02/2006    Patient's Medications  New Prescriptions   No medications on file  Previous Medications   DARUNAVIR-COBICISTAT (PREZCOBIX) 800-150 MG PER TABLET    Take 1 tablet by mouth daily. Swallow whole. Do NOT crush, break or chew tablets. Take with food.   EMTRICITABINE-TENOFOVIR (TRUVADA) 200-300 MG PER TABLET    Take 1 tablet by mouth daily.   OMEPRAZOLE (PRILOSEC) 20 MG CAPSULE    Take 1 capsule (20 mg total) by mouth daily.  Modified Medications   No medications on file  Discontinued Medications   DEXTROMETHORPHAN-GUAIFENESIN (MUCINEX DM) 30-600 MG PER 12 HR TABLET    Take 1 tablet by mouth 2 (two) times daily.    Subjective: Matthew Fitzpatrick is in for his routine HIV follow-up visit with the hearing impaired translator. He has had no problem obtaining or tolerating his Truvada and Prezcobix. He usually takes it in the morning but sometimes forgets or if he feels bad will take it later in the afternoon. He estimates this happens less than half of the days in any given month. He denies missing any doses in the past 6 months. He does not have a primary care physician. Review of Systems: Pertinent items are noted in HPI.  Past Medical History  Diagnosis Date  . Deaf   . HIV disease   . GERD (gastroesophageal reflux disease)   . CAP (community acquired pneumonia)     History  Substance Use Topics  . Smoking status: Never Smoker   . Smokeless tobacco: Never Used  . Alcohol Use: No    No family history  on file.  No Active Allergies  Objective: Temp: 98.2 F (36.8 C) (07/26 1602) Temp Source: Oral (07/26 1602) BP: 121/78 mmHg (07/26 1602) Pulse Rate: 72 (07/26 1602) Body mass index is 33.51 kg/(m^2).  General: His weight is up 15 pounds to 227 Oral: No oropharyngeal lesions Skin: No rash Lungs: Clear Cor: Regular S1 and S2 with no murmur Abdomen: Obese, soft and nontender Mood: Normal. He does not appear anxious or depressed.  Lab Results Lab Results  Component Value Date   WBC 5.4 02/12/2014   HGB 15.5 02/12/2014   HCT 44.2 02/12/2014   MCV 88.6 02/12/2014   PLT 255 02/12/2014    Lab Results  Component Value Date   CREATININE 0.95 02/12/2014   BUN 10 02/12/2014   NA 135 02/12/2014   K 3.8 02/12/2014   CL 101 02/12/2014   CO2 25 02/12/2014    Lab Results  Component Value Date   ALT 21 02/12/2014   AST 21 02/12/2014   ALKPHOS 133* 02/12/2014   BILITOT 0.4 02/12/2014    Lab Results  Component Value Date   CHOL 239* 02/12/2014   HDL 41 02/12/2014   LDLCALC 156* 02/12/2014   TRIG 208* 02/12/2014   CHOLHDL 5.8 02/12/2014    Lab Results HIV 1 RNA QUANT (copies/mL)  Date Value  02/12/2014 <20  08/12/2013 47*  08/06/2012 <20   CD4 T CELL  ABS  Date Value  02/12/2014 980 /uL  08/12/2013 860 /uL  08/06/2012 680 cmm     Assessment: His adherence with his anti-retroviral medication is good but I have asked him to do his best to take his medication at roughly the same time each day and continue to try not to miss any doses. I will continue his current regimen and repeat lab work today.  Plan: 1. Continue current retroviral regimen 2. Repeat lab work today 3. Recertify for SPAP 4. Follow-up in 6 months   Michel Bickers, MD [facility] for [place] [phone] pager   [phone] cell 09/22/2014, 4:26 PM

## 2014-09-23 LAB — RPR

## 2014-09-24 LAB — HIV-1 RNA QUANT-NO REFLEX-BLD: HIV-1 RNA Quant, Log: <1.3 {Log} (ref <1.30)

## 2014-09-24 LAB — T-HELPER CELL (CD4) - (RCID CLINIC ONLY)
CD4 T CELL ABS: 1080 /uL (ref 400–2700)
CD4 T CELL HELPER: 32 % — AB (ref 33–55)

## 2014-10-12 DIAGNOSIS — K219 Gastro-esophageal reflux disease without esophagitis: Secondary | ICD-10-CM | POA: Diagnosis not present

## 2014-10-12 DIAGNOSIS — B20 Human immunodeficiency virus [HIV] disease: Secondary | ICD-10-CM | POA: Diagnosis not present

## 2014-10-12 DIAGNOSIS — H905 Unspecified sensorineural hearing loss: Secondary | ICD-10-CM | POA: Diagnosis not present

## 2014-10-12 DIAGNOSIS — R05 Cough: Secondary | ICD-10-CM | POA: Diagnosis not present

## 2014-11-09 ENCOUNTER — Other Ambulatory Visit: Payer: Self-pay | Admitting: *Deleted

## 2014-11-09 DIAGNOSIS — B20 Human immunodeficiency virus [HIV] disease: Secondary | ICD-10-CM

## 2014-11-09 MED ORDER — DARUNAVIR-COBICISTAT 800-150 MG PO TABS: 1.0000 | ORAL_TABLET | Freq: Every day | ORAL | Status: DC

## 2014-11-09 MED ORDER — EMTRICITABINE-TENOFOVIR DF 200-300 MG PO TABS: 1.0000 | ORAL_TABLET | Freq: Every day | ORAL | Status: DC

## 2014-11-10 ENCOUNTER — Telehealth: Payer: Self-pay | Admitting: *Deleted

## 2014-11-10 NOTE — Telephone Encounter (Signed)
Pt called, stating he is unable to fill [facility] at [facility]. He did not want to speak to [facility] to see if his paperwork had been submitted.  He requested that this nurse contact his pharmacy. RN contacted pharmacy, patient needed to call and confirm his address before they would send the prescriptions. RN confirmed information, set up delivery for 9/14.  Notified pharmacy that patient is deaf and would require them to use the his interpreter line. Landis Gandy, RN

## 2014-11-26 NOTE — Progress Notes (Signed)
Notified [facility] via fax.  Landis Gandy, RN

## 2015-03-03 ENCOUNTER — Emergency Department (HOSPITAL_BASED_OUTPATIENT_CLINIC_OR_DEPARTMENT_OTHER)
Admission: EM | Admit: 2015-03-03 | Discharge: 2015-03-04 | Disposition: A | Discharge status: Home / Self Care | Payer: No Typology Code available for payment source | Attending: Emergency Medicine | Admitting: Emergency Medicine

## 2015-03-03 ENCOUNTER — Emergency Department (HOSPITAL_BASED_OUTPATIENT_CLINIC_OR_DEPARTMENT_OTHER): Payer: No Typology Code available for payment source

## 2015-03-03 ENCOUNTER — Encounter (HOSPITAL_BASED_OUTPATIENT_CLINIC_OR_DEPARTMENT_OTHER): Payer: Self-pay | Admitting: Emergency Medicine

## 2015-03-03 DIAGNOSIS — T148XXA Other injury of unspecified body region, initial encounter: Secondary | ICD-10-CM

## 2015-03-03 DIAGNOSIS — Z8701 Personal history of pneumonia (recurrent): Secondary | ICD-10-CM | POA: Insufficient documentation

## 2015-03-03 DIAGNOSIS — K219 Gastro-esophageal reflux disease without esophagitis: Secondary | ICD-10-CM | POA: Diagnosis not present

## 2015-03-03 DIAGNOSIS — Z79899 Other long term (current) drug therapy: Secondary | ICD-10-CM | POA: Insufficient documentation

## 2015-03-03 DIAGNOSIS — S39012A Strain of muscle, fascia and tendon of lower back, initial encounter: Secondary | ICD-10-CM | POA: Diagnosis not present

## 2015-03-03 DIAGNOSIS — Y998 Other external cause status: Secondary | ICD-10-CM | POA: Diagnosis not present

## 2015-03-03 DIAGNOSIS — S3992XA Unspecified injury of lower back, initial encounter: Secondary | ICD-10-CM | POA: Diagnosis present

## 2015-03-03 DIAGNOSIS — Y92410 Unspecified street and highway as the place of occurrence of the external cause: Secondary | ICD-10-CM | POA: Insufficient documentation

## 2015-03-03 DIAGNOSIS — Y9389 Activity, other specified: Secondary | ICD-10-CM | POA: Insufficient documentation

## 2015-03-03 DIAGNOSIS — H9190 Unspecified hearing loss, unspecified ear: Secondary | ICD-10-CM | POA: Diagnosis not present

## 2015-03-03 DIAGNOSIS — M545 Low back pain: Secondary | ICD-10-CM | POA: Diagnosis not present

## 2015-03-03 DIAGNOSIS — B20 Human immunodeficiency virus [HIV] disease: Secondary | ICD-10-CM | POA: Diagnosis not present
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
## 2015-03-04 DIAGNOSIS — S39012A Strain of muscle, fascia and tendon of lower back, initial encounter: Secondary | ICD-10-CM | POA: Diagnosis not present

## 2015-03-04 DIAGNOSIS — M545 Low back pain: Secondary | ICD-10-CM | POA: Diagnosis not present

## 2015-03-04 MED ORDER — IBUPROFEN 800 MG PO TABS: 800.0000 mg | ORAL_TABLET | Freq: Three times a day (TID) | ORAL | Status: DC

## 2015-03-04 MED ORDER — ACETAMINOPHEN 500 MG PO TABS
1000.0000 mg | ORAL_TABLET | Freq: Once | ORAL | Status: AC
  Administered 2015-03-04: 1000 mg via ORAL
  Filled 2015-03-04: qty 2

## 2015-03-04 MED ORDER — IBUPROFEN 800 MG PO TABS
800.0000 mg | ORAL_TABLET | Freq: Once | ORAL | Status: AC
  Administered 2015-03-04: 800 mg via ORAL
  Filled 2015-03-04: qty 1

## 2015-03-04 MED ORDER — METHOCARBAMOL 500 MG PO TABS: 500.0000 mg | ORAL_TABLET | Freq: Two times a day (BID) | ORAL | Status: DC

## 2015-03-04 MED ORDER — METHOCARBAMOL 500 MG PO TABS
1000.0000 mg | ORAL_TABLET | Freq: Once | ORAL | Status: AC
  Administered 2015-03-04: 1000 mg via ORAL
  Filled 2015-03-04: qty 2

## 2015-03-04 NOTE — ED Notes (Signed)
MD at bedside. 
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
## 2015-03-05 ENCOUNTER — Encounter: Payer: Self-pay | Admitting: *Deleted

## 2015-03-05 ENCOUNTER — Telehealth: Payer: Self-pay | Admitting: *Deleted
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
## 2015-03-05 NOTE — Telephone Encounter (Signed)
Called the patient and advised what Dr [name] advised. The patient is fine with that think he will need to be reminded again to establish with a PCP. The patient wants the note mailed to him advised will place in mail as soon as the doctor signs it.

## 2015-03-05 NOTE — Telephone Encounter (Signed)
Yes, he can have a work excuse through 03/16/15 but if he feels he needs to be out longer than that he will need to be reevaluated by a new PCP or other provider.

## 2015-03-16 ENCOUNTER — Ambulatory Visit: Payer: Medicare Other

## 2015-03-16 ENCOUNTER — Other Ambulatory Visit: Payer: Self-pay | Admitting: *Deleted

## 2015-03-16 ENCOUNTER — Encounter: Payer: Self-pay | Admitting: Internal Medicine

## 2015-03-16 ENCOUNTER — Ambulatory Visit (INDEPENDENT_AMBULATORY_CARE_PROVIDER_SITE_OTHER): Payer: Medicare Other | Admitting: Internal Medicine

## 2015-03-16 VITALS — BP 130/86 | HR 75 | Temp 98.1°F | Wt 220.5 lb

## 2015-03-16 DIAGNOSIS — B20 Human immunodeficiency virus [HIV] disease: Secondary | ICD-10-CM | POA: Diagnosis not present

## 2015-03-16 DIAGNOSIS — Z23 Encounter for immunization: Secondary | ICD-10-CM | POA: Diagnosis present

## 2015-03-16 DIAGNOSIS — Z79899 Other long term (current) drug therapy: Secondary | ICD-10-CM | POA: Diagnosis not present

## 2015-03-16 LAB — COMPREHENSIVE METABOLIC PANEL
ALK PHOS: 109 U/L (ref 40–115)
ALT: 34 U/L (ref 9–46)
AST: 26 U/L (ref 10–40)
Albumin: 4 g/dL (ref 3.6–5.1)
BUN: 15 mg/dL (ref 7–25)
CALCIUM: 9 mg/dL (ref 8.6–10.3)
CHLORIDE: 101 mmol/L (ref 98–110)
CO2: 28 mmol/L (ref 20–31)
Creat: 1.2 mg/dL (ref 0.60–1.35)
GLUCOSE: 96 mg/dL (ref 65–99)
POTASSIUM: 4.1 mmol/L (ref 3.5–5.3)
Sodium: 138 mmol/L (ref 135–146)
Total Bilirubin: 0.7 mg/dL (ref 0.2–1.2)
Total Protein: 7.4 g/dL (ref 6.1–8.1)

## 2015-03-16 LAB — CBC
HEMATOCRIT: 44.2 % (ref 39.0–52.0)
Hemoglobin: 15 g/dL (ref 13.0–17.0)
MCH: 30.4 pg (ref 26.0–34.0)
MCHC: 33.9 g/dL (ref 30.0–36.0)
MCV: 89.7 fL (ref 78.0–100.0)
MPV: 10 fL (ref 8.6–12.4)
PLATELETS: 257 10*3/uL (ref 150–400)
RBC: 4.93 MIL/uL (ref 4.22–5.81)
RDW: 13.6 % (ref 11.5–15.5)
WBC: 6 10*3/uL (ref 4.0–10.5)

## 2015-03-16 LAB — LIPID PANEL
CHOL/HDL RATIO: 4.6 ratio (ref <=5.0)
Cholesterol: 210 mg/dL — ABNORMAL HIGH (ref 125–200)
HDL: 46 mg/dL (ref >=40)
LDL CALC: 138 mg/dL — AB (ref <130)
TRIGLYCERIDES: 128 mg/dL (ref <150)
VLDL: 26 mg/dL (ref <30)

## 2015-03-16 MED ORDER — EMTRICITABINE-TENOFOVIR DF 200-300 MG PO TABS: 1.0000 | ORAL_TABLET | Freq: Every day | ORAL | Status: DC

## 2015-03-16 MED ORDER — DARUNAVIR-COBICISTAT 800-150 MG PO TABS: 1.0000 | ORAL_TABLET | Freq: Every day | ORAL | Status: DC
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
## 2015-03-16 NOTE — Assessment & Plan Note (Signed)
I have asked our ID pharmacist, Onnie Boer, to help sort out what medications he is actually receiving from the pharmacy and why he was told that he could no longer use the mail order service. He should be on Truvada and Prezcobix. I will recheck lab work today.

## 2015-03-17 DIAGNOSIS — Z79899 Other long term (current) drug therapy: Secondary | ICD-10-CM | POA: Diagnosis not present

## 2015-03-17 DIAGNOSIS — B20 Human immunodeficiency virus [HIV] disease: Secondary | ICD-10-CM | POA: Diagnosis not present

## 2015-03-17 LAB — T-HELPER CELL (CD4) - (RCID CLINIC ONLY)
CD4 T CELL ABS: 1150 /uL (ref 400–2700)
CD4 T CELL HELPER: 32 % — AB (ref 33–55)

## 2015-03-17 LAB — RPR

## 2015-03-17 LAB — HIV-1 RNA QUANT-NO REFLEX-BLD: HIV-1 RNA Quant, Log: <1.3 Log copies/mL (ref <1.30)

## 2015-03-31 ENCOUNTER — Telehealth: Payer: Self-pay | Admitting: *Deleted
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
## 2015-04-19 ENCOUNTER — Telehealth: Payer: Self-pay | Admitting: *Deleted
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
## 2015-05-14 ENCOUNTER — Encounter (HOSPITAL_BASED_OUTPATIENT_CLINIC_OR_DEPARTMENT_OTHER): Payer: Self-pay

## 2015-05-14 ENCOUNTER — Emergency Department (HOSPITAL_BASED_OUTPATIENT_CLINIC_OR_DEPARTMENT_OTHER)
Admission: EM | Admit: 2015-05-14 | Discharge: 2015-05-14 | Disposition: A | Discharge status: Home / Self Care | Payer: Medicare Other | Attending: Emergency Medicine | Admitting: Emergency Medicine

## 2015-05-14 ENCOUNTER — Telehealth: Payer: Self-pay | Admitting: *Deleted

## 2015-05-14 DIAGNOSIS — H9190 Unspecified hearing loss, unspecified ear: Secondary | ICD-10-CM | POA: Insufficient documentation

## 2015-05-14 DIAGNOSIS — K219 Gastro-esophageal reflux disease without esophagitis: Secondary | ICD-10-CM | POA: Insufficient documentation

## 2015-05-14 DIAGNOSIS — Z21 Asymptomatic human immunodeficiency virus [HIV] infection status: Secondary | ICD-10-CM | POA: Insufficient documentation

## 2015-05-14 DIAGNOSIS — Z791 Long term (current) use of non-steroidal anti-inflammatories (NSAID): Secondary | ICD-10-CM | POA: Diagnosis not present

## 2015-05-14 DIAGNOSIS — Z79899 Other long term (current) drug therapy: Secondary | ICD-10-CM | POA: Diagnosis not present

## 2015-05-14 DIAGNOSIS — Z8701 Personal history of pneumonia (recurrent): Secondary | ICD-10-CM | POA: Diagnosis not present

## 2015-05-14 DIAGNOSIS — S39012D Strain of muscle, fascia and tendon of lower back, subsequent encounter: Secondary | ICD-10-CM

## 2015-05-14 DIAGNOSIS — M545 Low back pain: Secondary | ICD-10-CM | POA: Diagnosis present

## 2015-05-14 MED ORDER — IBUPROFEN 800 MG PO TABS: 800.0000 mg | ORAL_TABLET | Freq: Three times a day (TID) | ORAL | Status: DC

## 2015-05-14 MED ORDER — METHOCARBAMOL 500 MG PO TABS: 500.0000 mg | ORAL_TABLET | Freq: Two times a day (BID) | ORAL | Status: DC

## 2015-05-14 NOTE — ED Notes (Signed)
Domenic Moras, PA-C in room with patient now.

## 2015-05-14 NOTE — ED Notes (Signed)
Sign language interpretter in room with patient now.  Pt states he is upset because he thought that an interpretter was not requested upon his initial entry to the ED.  Registration called charge nurse upon patient arrival to ED and immediately requested interpretter for the patient.  Patient made aware of this interaction and request and this RN apologized to patient for misunderstanding/miscommunication.  Pt awaiting EDP evaluation.
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
## 2015-05-14 NOTE — Discharge Instructions (Signed)

## 2015-05-14 NOTE — Telephone Encounter (Signed)
Leg and back causing pain.  Pt has not established with Dr. Ronnell Freshwater office.  Pt has decided to go to [facility] for evaluation this evening.  Pain is not tolerable per the pt.

## 2015-05-14 NOTE — ED Notes (Signed)
Received call from interpreter service,  An interpreter will be dispatched asap.

## 2015-05-14 NOTE — ED Notes (Signed)
Sign language interpreter called to come to ED for interpretation.

## 2015-06-08 ENCOUNTER — Other Ambulatory Visit: Payer: Self-pay | Admitting: *Deleted

## 2015-06-08 DIAGNOSIS — K219 Gastro-esophageal reflux disease without esophagitis: Secondary | ICD-10-CM

## 2015-06-08 MED ORDER — OMEPRAZOLE 20 MG PO CPDR: 20.0000 mg | DELAYED_RELEASE_CAPSULE | Freq: Every day | ORAL | Status: DC

## 2015-07-15 ENCOUNTER — Telehealth: Payer: Self-pay | Admitting: *Deleted

## 2015-07-15 NOTE — Telephone Encounter (Signed)
Patient called and left voice mail wanting to know if "lack of sleep can cause death". He also wanted to know when his next appt is scheduled. Multiple attempts to call patient back and the voice relay system is down. Myrtis Hopping

## 2015-07-29 ENCOUNTER — Other Ambulatory Visit: Payer: Medicare Other

## 2015-07-29 DIAGNOSIS — Z79899 Other long term (current) drug therapy: Secondary | ICD-10-CM | POA: Diagnosis not present

## 2015-07-29 DIAGNOSIS — B20 Human immunodeficiency virus [HIV] disease: Secondary | ICD-10-CM | POA: Diagnosis not present

## 2015-07-29 LAB — LIPID PANEL
Cholesterol: 230 mg/dL — ABNORMAL HIGH (ref 125–200)
HDL: 61 mg/dL (ref >=40)
LDL CALC: 153 mg/dL — AB (ref <130)
TRIGLYCERIDES: 79 mg/dL (ref <150)
Total CHOL/HDL Ratio: 3.8 Ratio (ref <=5.0)
VLDL: 16 mg/dL (ref <30)

## 2015-07-30 LAB — T-HELPER CELL (CD4) - (RCID CLINIC ONLY)
CD4 % Helper T Cell: 29 % — ABNORMAL LOW (ref 33–55)
CD4 T Cell Abs: 1040 /uL (ref 400–2700)

## 2015-08-02 LAB — HIV-1 RNA QUANT-NO REFLEX-BLD
HIV 1 RNA Quant: 42 copies/mL — ABNORMAL HIGH (ref <20)
HIV-1 RNA Quant, Log: 1.62 Log copies/mL — ABNORMAL HIGH (ref <1.30)

## 2015-08-12 ENCOUNTER — Encounter: Payer: Self-pay | Admitting: Internal Medicine

## 2015-08-12 ENCOUNTER — Telehealth: Payer: Self-pay | Admitting: *Deleted

## 2015-08-12 ENCOUNTER — Ambulatory Visit (INDEPENDENT_AMBULATORY_CARE_PROVIDER_SITE_OTHER): Payer: Medicare Other | Admitting: Internal Medicine

## 2015-08-12 VITALS — BP 124/80 | HR 57 | Temp 98.5°F | Wt 220.0 lb

## 2015-08-12 DIAGNOSIS — B20 Human immunodeficiency virus [HIV] disease: Secondary | ICD-10-CM

## 2015-08-12 DIAGNOSIS — Z79899 Other long term (current) drug therapy: Secondary | ICD-10-CM | POA: Diagnosis present

## 2015-08-12 DIAGNOSIS — K529 Noninfective gastroenteritis and colitis, unspecified: Secondary | ICD-10-CM | POA: Diagnosis not present

## 2015-08-12 DIAGNOSIS — K219 Gastro-esophageal reflux disease without esophagitis: Secondary | ICD-10-CM | POA: Diagnosis not present

## 2015-08-12 HISTORY — DX: Noninfective gastroenteritis and colitis, unspecified: K52.9

## 2015-08-12 MED ORDER — EMTRICITABINE-TENOFOVIR AF 200-25 MG PO TABS: 1.0000 | ORAL_TABLET | Freq: Every day | ORAL | Status: DC

## 2015-08-12 NOTE — Telephone Encounter (Signed)
Called patient via interpreter and left voice mail regarding appt to establish care at [facility] on 08/30/15 at 9:30 AM. They do have a policy stating if over 15 minutes late he will need to reschedule. Asked that he return the call for address and phone # and to confirm.

## 2015-08-12 NOTE — Telephone Encounter (Signed)
Spoke to patient and appointment info and address given. He is aware of the 15 minute late policy. Myrtis Hopping

## 2015-08-12 NOTE — Assessment & Plan Note (Signed)
He has mild, acute gastroenteritis that will probably run its course with time and symptomatic therapy.

## 2015-08-12 NOTE — Assessment & Plan Note (Signed)
His acid reflux is under excellent control with omeprazole.

## 2015-08-12 NOTE — Progress Notes (Signed)
Patient Active Problem List   Diagnosis Date Noted  . Human immunodeficiency virus (HIV) disease ([place]) 03/02/2006    Priority: High  . Acute gastroenteritis 08/12/2015  . Acute conjunctivitis 02/12/2014  . Dyslipidemia 07/02/2012  . GERD (gastroesophageal reflux disease) 06/28/2010  . 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 ANXIETY 03/02/2006  . DEPRESSION 03/02/2006  . ALLERGIC RHINITIS 03/02/2006    Patient's Medications  New Prescriptions   EMTRICITABINE-TENOFOVIR AF (DESCOVY) 200-25 MG TABLET    Take 1 tablet by mouth daily.  Previous Medications   DARUNAVIR-COBICISTAT (PREZCOBIX) 800-150 MG TABLET    Take 1 tablet by mouth daily. Swallow whole. Do NOT crush, break or chew tablets. Take with food.   OMEPRAZOLE (PRILOSEC) 20 MG CAPSULE    Take 1 capsule (20 mg total) by mouth daily.  Modified Medications   No medications on file  Discontinued Medications   EMTRICITABINE-TENOFOVIR (TRUVADA) 200-300 MG TABLET    Take 1 tablet by mouth daily.   IBUPROFEN (ADVIL,MOTRIN) 800 MG TABLET    Take 1 tablet (800 mg total) by mouth 3 (three) times daily.   METHOCARBAMOL (ROBAXIN) 500 MG TABLET    Take 1 tablet (500 mg total) by mouth 2 (two) times daily.    Subjective: Matthew Fitzpatrick is in for his routine HIV follow-up visit. His interpreter is with him. He denies missing any doses of his Truvada or Prezcobix. He is tolerating them well. He is also taking his omeprazole. He had been feeling well until yesterday when he developed watery diarrhea and some nausea. He is a little confused about a letter he received from his pharmacy. He states that he will have to start paying $400 for his medication.  Review of Systems: Review of Systems  Constitutional: Negative for fever, chills, weight loss, malaise/fatigue and diaphoresis.  HENT: Negative for sore throat.   Respiratory: Negative for cough, sputum production and shortness of breath.     Cardiovascular: Negative for chest pain.  Gastrointestinal: Positive for nausea and diarrhea. Negative for vomiting.  Genitourinary: Negative for dysuria and frequency.  Musculoskeletal: Negative for myalgias and joint pain.  Skin: Negative for rash.  Neurological: Negative for dizziness and headaches.  Psychiatric/Behavioral: Negative for depression and substance abuse. The patient is not nervous/anxious.     Past Medical History  Diagnosis Date  . Deaf   . HIV disease ([place])   . GERD (gastroesophageal reflux disease)   . CAP (community acquired pneumonia)     Social History  Substance Use Topics  . Smoking status: Never Smoker   . Smokeless tobacco: Never Used  . Alcohol Use: No    No family history on file.  No Known Allergies  Objective:  Filed Vitals:   08/12/15 1054  BP: 124/80  Pulse: 57  Temp: 98.5 F (36.9 C)  TempSrc: Oral  Weight: 220 lb (99.791 kg)   Body mass index is 33.46 kg/(m^2).  Physical Exam  Constitutional: He is oriented to person, place, and time.  He is alert and in no distress.  HENT:  Mouth/Throat: No oropharyngeal exudate.  Eyes: Conjunctivae are normal.  Cardiovascular: Normal rate and regular rhythm.   No murmur heard. Pulmonary/Chest: Breath sounds normal.  Abdominal: Soft. Bowel sounds are normal. He exhibits no mass. There is no tenderness.  Mild epigastric tenderness with palpation.  Musculoskeletal: Normal range of motion.  Neurological: He is alert and oriented to person, place, and time.  Skin: No  rash noted.  Psychiatric: Mood and affect normal.    Lab Results Lab Results  Component Value Date   WBC 6.0 03/16/2015   HGB 15.0 03/16/2015   HCT 44.2 03/16/2015   MCV 89.7 03/16/2015   PLT 257 03/16/2015    Lab Results  Component Value Date   CREATININE 1.20 03/16/2015   BUN 15 03/16/2015   NA 138 03/16/2015   K 4.1 03/16/2015   CL 101 03/16/2015   CO2 28 03/16/2015    Lab Results  Component Value Date    ALT 34 03/16/2015   AST 26 03/16/2015   ALKPHOS 109 03/16/2015   BILITOT 0.7 03/16/2015    Lab Results  Component Value Date   CHOL 230* 07/29/2015   HDL 61 07/29/2015   LDLCALC 153* 07/29/2015   TRIG 79 07/29/2015   CHOLHDL 3.8 07/29/2015    Lab Results HIV 1 RNA QUANT (copies/mL)  Date Value  07/29/2015 42*  03/16/2015 <20  09/22/2014 <20   CD4 T CELL ABS (/uL)  Date Value  07/29/2015 1040  03/16/2015 1150  09/22/2014 1080      Problem List Items Addressed This Visit      High   Human immunodeficiency virus (HIV) disease ([place])    His infection is under good control. I will change Truvada to [place]covy and continue Prezcobix. He will follow-up in 6 months. He needs a new primary care provider that can arrange an interpreter for him. We will try to help him establish this.      Relevant Medications   emtricitabine-tenofovir AF (DESCOVY) 200-25 MG tablet   Other Relevant Orders   T-helper cell (CD4)- ([facility] clinic only)   HIV 1 RNA quant-no reflex-bld   CBC   Comprehensive metabolic panel   Lipid panel   RPR     Unprioritized   Acute gastroenteritis    He has mild, acute gastroenteritis that will probably run its course with time and symptomatic therapy.      GERD (gastroesophageal reflux disease)    His acid reflux is under excellent control with omeprazole.       Other Visit Diagnoses    Encounter for long-term (current) use of medications    -  Primary    Relevant Orders    Lipid panel         Michel Bickers, MD [facility] for [place] [phone] pager   [phone] cell 08/12/2015, 11:25 AM

## 2015-08-12 NOTE — Assessment & Plan Note (Signed)
His infection is under good control. I will change Truvada to Descovy and continue Prezcobix. He will follow-up in 6 months. He needs a new primary care provider that can arrange an interpreter for him. We will try to help him establish this.

## 2015-08-13 NOTE — Telephone Encounter (Signed)
Called and left patient a voice mail to please call Mickel Baas at [facility] to reschedule his appt; as their office is closed for Holiday on 08/30/15. Myrtis Hopping

## 2015-08-30 ENCOUNTER — Ambulatory Visit: Payer: Medicare Other | Admitting: Family Medicine

## 2015-09-09 ENCOUNTER — Ambulatory Visit: Payer: Medicare Other

## 2015-09-29 ENCOUNTER — Encounter: Payer: Self-pay | Admitting: Internal Medicine

## 2015-10-12 ENCOUNTER — Other Ambulatory Visit: Payer: Self-pay | Admitting: Internal Medicine

## 2015-10-12 DIAGNOSIS — K219 Gastro-esophageal reflux disease without esophagitis: Secondary | ICD-10-CM

## 2015-11-17 ENCOUNTER — Telehealth: Payer: Self-pay | Admitting: *Deleted

## 2015-11-17 NOTE — Telephone Encounter (Signed)
The patient does not currently have his HIV medications.  He has allowed his [employer] Medicare Part D to expire.  Patient needs to pay for [facility] Part D coverage to reinstate.  RN spoke with [place].  [facility] to call SPAP to see if temporary ADAP is possible.  [facility] to give the patient information to contact/call [employer] Medicare to reinstate his Part D.  If it is not possible for ADAP coverage, [facility] will return phone call so that [facility] can offer the patient alternative medication source, ie. [employer], until the patient can reactivate [employer] Medicare Part D.

## 2015-11-24 ENCOUNTER — Telehealth: Payer: Self-pay | Admitting: *Deleted

## 2015-11-24 NOTE — Telephone Encounter (Signed)
Went to urgent care in [facility] for fever.   Left message for the patient to return call tomorrow.

## 2015-11-25 ENCOUNTER — Telehealth: Payer: Self-pay

## 2015-12-16 ENCOUNTER — Telehealth: Payer: Self-pay | Admitting: *Deleted

## 2015-12-16 NOTE — Telephone Encounter (Signed)
Patient called and left a voice mail, stating he was having difficulty receiving his Hiv meds, because the pharmacy closes before he gets out of work. Called [facility] and it was just a misunderstanding. The pharmacist actually told him they have a call center that is open after hours until 10 pm. That # is [phone] and he is actually being delivered his meds tomorrow. Called patient back and got his voice mail. The voice relay system left him a detailed message with this information. Myrtis Hopping

## 2016-02-01 ENCOUNTER — Other Ambulatory Visit: Payer: Medicare Other

## 2016-02-15 ENCOUNTER — Ambulatory Visit: Payer: Medicare Other | Admitting: Internal Medicine

## 2016-02-25 ENCOUNTER — Telehealth: Payer: Self-pay | Admitting: *Deleted

## 2016-02-25 DIAGNOSIS — B20 Human immunodeficiency virus [HIV] disease: Secondary | ICD-10-CM

## 2016-02-25 MED ORDER — DARUNAVIR-COBICISTAT 800-150 MG PO TABS: 1.0000 | ORAL_TABLET | Freq: Every day | ORAL | 5 refills | Status: DC

## 2016-02-25 NOTE — Addendum Note (Signed)
Addended by: Lorne Skeens D on: 02/25/2016 02:54 PM   Modules accepted: Orders

## 2016-02-25 NOTE — Telephone Encounter (Signed)
Needing to make Lab and MD appts.  Canceled previous appointments.  Also, needs to renew SPAP.  Advised to bring appropriate financial paperwork when he comes for Lab work on 03/02/16.

## 2016-03-02 ENCOUNTER — Ambulatory Visit: Payer: Medicare Other

## 2016-03-02 ENCOUNTER — Other Ambulatory Visit: Payer: Medicare Other

## 2016-03-02 DIAGNOSIS — Z79899 Other long term (current) drug therapy: Secondary | ICD-10-CM

## 2016-03-02 DIAGNOSIS — B20 Human immunodeficiency virus [HIV] disease: Secondary | ICD-10-CM | POA: Diagnosis not present

## 2016-03-02 LAB — COMPREHENSIVE METABOLIC PANEL
ALK PHOS: 98 U/L (ref 40–115)
ALT: 20 U/L (ref 9–46)
AST: 24 U/L (ref 10–40)
Albumin: 3.9 g/dL (ref 3.6–5.1)
BILIRUBIN TOTAL: 0.5 mg/dL (ref 0.2–1.2)
BUN: 13 mg/dL (ref 7–25)
CALCIUM: 9.1 mg/dL (ref 8.6–10.3)
CO2: 26 mmol/L (ref 20–31)
Chloride: 103 mmol/L (ref 98–110)
Creat: 0.96 mg/dL (ref 0.60–1.35)
GLUCOSE: 89 mg/dL (ref 65–99)
Potassium: 4.1 mmol/L (ref 3.5–5.3)
Sodium: 138 mmol/L (ref 135–146)
TOTAL PROTEIN: 7.3 g/dL (ref 6.1–8.1)

## 2016-03-02 LAB — CBC
HEMATOCRIT: 43.8 % (ref 38.5–50.0)
HEMOGLOBIN: 15 g/dL (ref 13.2–17.1)
MCH: 31.4 pg (ref 27.0–33.0)
MCHC: 34.2 g/dL (ref 32.0–36.0)
MCV: 91.6 fL (ref 80.0–100.0)
MPV: 10.1 fL (ref 7.5–12.5)
Platelets: 273 10*3/uL (ref 140–400)
RBC: 4.78 MIL/uL (ref 4.20–5.80)
RDW: 13.9 % (ref 11.0–15.0)
WBC: 5.7 10*3/uL (ref 3.8–10.8)

## 2016-03-02 LAB — LIPID PANEL
CHOLESTEROL: 218 mg/dL — AB (ref <200)
HDL: 45 mg/dL (ref >40)
LDL Cholesterol: 145 mg/dL — ABNORMAL HIGH (ref <100)
Total CHOL/HDL Ratio: 4.8 Ratio (ref <5.0)
Triglycerides: 141 mg/dL (ref <150)
VLDL: 28 mg/dL (ref <30)

## 2016-03-03 LAB — T-HELPER CELL (CD4) - (RCID CLINIC ONLY)
CD4 T CELL ABS: 1030 /uL (ref 400–2700)
CD4 T CELL HELPER: 31 % — AB (ref 33–55)

## 2016-03-03 LAB — RPR

## 2016-03-06 LAB — HIV-1 RNA QUANT-NO REFLEX-BLD
HIV 1 RNA Quant: 59 copies/mL — ABNORMAL HIGH (ref <20)
HIV-1 RNA Quant, Log: 1.77 Log copies/mL — ABNORMAL HIGH (ref <1.30)

## 2016-03-21 ENCOUNTER — Other Ambulatory Visit: Payer: Self-pay | Admitting: *Deleted

## 2016-03-21 DIAGNOSIS — B20 Human immunodeficiency virus [HIV] disease: Secondary | ICD-10-CM

## 2016-03-21 DIAGNOSIS — K219 Gastro-esophageal reflux disease without esophagitis: Secondary | ICD-10-CM

## 2016-03-21 MED ORDER — EMTRICITABINE-TENOFOVIR AF 200-25 MG PO TABS: 1.0000 | ORAL_TABLET | Freq: Every day | ORAL | 5 refills | Status: DC

## 2016-03-21 MED ORDER — DARUNAVIR-COBICISTAT 800-150 MG PO TABS: 1.0000 | ORAL_TABLET | Freq: Every day | ORAL | 5 refills | Status: DC

## 2016-03-21 MED ORDER — OMEPRAZOLE 20 MG PO CPDR: DELAYED_RELEASE_CAPSULE | ORAL | 5 refills | Status: DC

## 2016-03-23 ENCOUNTER — Ambulatory Visit: Payer: Medicare Other | Admitting: Internal Medicine

## 2016-04-07 ENCOUNTER — Encounter: Payer: Self-pay | Admitting: Internal Medicine

## 2016-04-13 ENCOUNTER — Encounter: Payer: Self-pay | Admitting: Internal Medicine

## 2016-04-20 ENCOUNTER — Encounter: Payer: Self-pay | Admitting: Internal Medicine

## 2016-04-20 ENCOUNTER — Ambulatory Visit (INDEPENDENT_AMBULATORY_CARE_PROVIDER_SITE_OTHER): Payer: Medicare Other | Admitting: Internal Medicine

## 2016-04-20 DIAGNOSIS — E66811 Obesity, class 1: Secondary | ICD-10-CM

## 2016-04-20 DIAGNOSIS — B20 Human immunodeficiency virus [HIV] disease: Secondary | ICD-10-CM | POA: Diagnosis not present

## 2016-04-20 DIAGNOSIS — E669 Obesity, unspecified: Secondary | ICD-10-CM

## 2016-04-20 DIAGNOSIS — K219 Gastro-esophageal reflux disease without esophagitis: Secondary | ICD-10-CM | POA: Diagnosis not present

## 2016-04-20 DIAGNOSIS — Z23 Encounter for immunization: Secondary | ICD-10-CM

## 2016-04-20 HISTORY — DX: Obesity, unspecified: E66.9

## 2016-04-20 HISTORY — DX: Obesity, class 1: E66.811

## 2016-04-20 NOTE — Assessment & Plan Note (Signed)
I talked to him about his dramatic weight gain and elevated BMI and the health consequences of carrying that much weight on his frame. I encouraged him to think about lifestyle modification and regular exercise.

## 2016-04-20 NOTE — Assessment & Plan Note (Signed)
His reflux symptoms flared when he ran out of his omeprazole for 1 month but are now back under good control.

## 2016-04-20 NOTE — Assessment & Plan Note (Signed)
He has had low-level viral activation in the last 2 times he has had lab work checked but overall his infection remains under good control. I had a meeting with our ID pharmacist today who will check with his pharmacy to make sure that there are no gaps in his coverage while his SPAP is being processed. He will follow-up. After lab work in 6 months. I did convince him to get a flu shot today.

## 2016-04-20 NOTE — Progress Notes (Signed)
Patient Active Problem List   Diagnosis Date Noted  . Human immunodeficiency virus (HIV) disease ([place]) 03/02/2006    Priority: High  . Obesity (BMI 30.0-34.9) 04/20/2016  . Acute gastroenteritis 08/12/2015  . Acute conjunctivitis 02/12/2014  . Dyslipidemia 07/02/2012  . GERD (gastroesophageal reflux disease) 06/28/2010  . 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 ANXIETY 03/02/2006  . DEPRESSION 03/02/2006  . ALLERGIC RHINITIS 03/02/2006    Patient's Medications  New Prescriptions   No medications on file  Previous Medications   DARUNAVIR-COBICISTAT (PREZCOBIX) 800-150 MG TABLET    Take 1 tablet by mouth daily. Swallow whole. Do NOT crush, break or chew tablets. Take with food.   EMTRICITABINE-TENOFOVIR AF (DESCOVY) 200-25 MG TABLET    Take 1 tablet by mouth daily.   OMEPRAZOLE (PRILOSEC) 20 MG CAPSULE    TAKE 1 CAPSULE(20 MG) BY MOUTH DAILY  Modified Medications   No medications on file  Discontinued Medications   No medications on file    Subjective: Matthew Fitzpatrick is in for his routine HIV follow-up visit. He is interviewed with the sign language interpreter. He denies missing any doses of his Descovy or Prezcobix. He can pick them off of his chart. He tells me that he recently spoke to someone here about his SPAP. He seems to think it was approved but was told that it was not. He recently ran out of his omeprazole and developed worsening reflux symptoms. He is now back on it. He does not have a primary care provider. Last year he went to see a primary care provider but they did not offer interpreter services so they could not communicate. He does not get any regular exercise but states that work is very physical.  Review of Systems: Review of Systems  Constitutional: Negative for chills, diaphoresis, fever, malaise/fatigue and weight loss.  HENT: Negative for sore throat.   Respiratory: Negative for cough, sputum production and  shortness of breath.   Cardiovascular: Negative for chest pain.  Gastrointestinal: Positive for heartburn. Negative for abdominal pain, diarrhea, nausea and vomiting.  Genitourinary: Negative for dysuria and frequency.  Musculoskeletal: Negative for joint pain and myalgias.  Skin: Negative for rash.  Neurological: Negative for dizziness and headaches.  Psychiatric/Behavioral: Negative for depression and substance abuse. The patient is not nervous/anxious.     Past Medical History:  Diagnosis Date  . CAP (community acquired pneumonia)   . Deaf   . GERD (gastroesophageal reflux disease)   . HIV disease [facility])     Social History  Substance Use Topics  . Smoking status: Never Smoker  . Smokeless tobacco: Never Used  . Alcohol use No    No family history on file.  No Known Allergies  Objective:  Vitals:   04/20/16 1100  BP: 125/82  Pulse: 68  Temp: 98.1 F (36.7 C)  TempSrc: Oral  Weight: 227 lb 8 oz (103.2 kg)   Body mass index is 34.59 kg/m.  Physical Exam  Constitutional: He is oriented to person, place, and time.  He is calm and pleasant. He has had steady weight gain of 50 pounds since 2010.  HENT:  Mouth/Throat: No oropharyngeal exudate.  Eyes: Conjunctivae are normal.  Cardiovascular: Normal rate and regular rhythm.   No murmur heard. Pulmonary/Chest: Effort normal and breath sounds normal.  Abdominal: Soft. He exhibits no mass. There is no tenderness.  Musculoskeletal: Normal range of motion.  Neurological: He is alert and oriented  to person, place, and time.  Skin: No rash noted.  Psychiatric: Mood and affect normal.    Lab Results Lab Results  Component Value Date   WBC 5.7 03/02/2016   HGB 15.0 03/02/2016   HCT 43.8 03/02/2016   MCV 91.6 03/02/2016   PLT 273 03/02/2016    Lab Results  Component Value Date   CREATININE 0.96 03/02/2016   BUN 13 03/02/2016   NA 138 03/02/2016   K 4.1 03/02/2016   CL 103 03/02/2016   CO2 26 03/02/2016      Lab Results  Component Value Date   ALT 20 03/02/2016   AST 24 03/02/2016   ALKPHOS 98 03/02/2016   BILITOT 0.5 03/02/2016    Lab Results  Component Value Date   CHOL 218 (H) 03/02/2016   HDL 45 03/02/2016   LDLCALC 145 (H) 03/02/2016   TRIG 141 03/02/2016   CHOLHDL 4.8 03/02/2016   HIV 1 RNA Quant (copies/mL)  Date Value  03/02/2016 59 (H)  07/29/2015 42 (H)  03/16/2015 <20   CD4 T Cell Abs (/uL)  Date Value  03/02/2016 1,030  07/29/2015 1,040  03/16/2015 1,150     Problem List Items Addressed This Visit      High   Human immunodeficiency virus (HIV) disease ([place])    He has had low-level viral activation in the last 2 times he has had lab work checked but overall his infection remains under good control. I had a meeting with our ID pharmacist today who will check with his pharmacy to make sure that there are no gaps in his coverage while his SPAP is being processed. He will follow-up. After lab work in 6 months. I did convince him to get a flu shot today.      Relevant Orders   T-helper cell (CD4)- ([facility] only)   HIV 1 RNA quant-no reflex-bld     Unprioritized   GERD (gastroesophageal reflux disease)    His reflux symptoms flared when he ran out of his omeprazole for 1 month but are now back under good control.      Obesity (BMI 30.0-34.9)    I talked to him about his dramatic weight gain and elevated BMI and the health consequences of carrying that much weight on his frame. I encouraged him to think about lifestyle modification and regular exercise.           Michel Bickers, MD [facility] [phone] pager   [phone] cell 04/20/2016, 11:25 AM

## 2016-04-20 NOTE — Addendum Note (Signed)
Addended by: Lorne Skeens D on: 04/20/2016 12:08 PM   Modules accepted: Orders

## 2016-06-16 ENCOUNTER — Telehealth: Payer: Self-pay | Admitting: *Deleted

## 2016-06-16 NOTE — Telephone Encounter (Signed)
Patient called, asking why his medication was only approved through 4/23.  RN found a note from [place] stating "SPAP APPROVED UNTIL 06/19/2016 - PATIENT HAS BEEN TOLD( PER VOICEMAIL) THAT HE MUST GET PART D AGAIN IN ORDER TO CONTINUE IN PROGRAM.   05/04/2016  [name]"   Patient states his Red/White/Blue card is Part D as well, but it is not in front of him at this time. RN explained that the Red/White/Blue card we have on file is A/B only. Patient will look for his Part D information and will call Juliann Pulse Monday to give it to her. Matthew Gandy, RN

## 2016-06-22 ENCOUNTER — Ambulatory Visit: Payer: Medicare Other

## 2016-06-23 ENCOUNTER — Encounter: Payer: Self-pay | Admitting: Internal Medicine

## 2016-07-10 ENCOUNTER — Telehealth: Payer: Self-pay

## 2016-07-10 ENCOUNTER — Telehealth: Payer: Self-pay | Admitting: *Deleted

## 2016-07-10 NOTE — Telephone Encounter (Signed)
Pre visit call made to patient  Left message with patients answerig service for call back. Patient is deaf.

## 2016-07-10 NOTE — Telephone Encounter (Signed)
Patient called and left a voice mail stating that he has an appt at [facility] for tomorrow and someone told him that he will have to pay for his office visits because he does not have a copy of his Medicare card. Patient does not have an appt here until July. It looks like he has already applied for SPAP. Attempted to call him back x 2 and the relay operator stated that patient's phone just hangs up and will not allow her to type a message. If patient returns the call he should be transferred to [facility], financial representative.

## 2016-07-11 ENCOUNTER — Ambulatory Visit (HOSPITAL_BASED_OUTPATIENT_CLINIC_OR_DEPARTMENT_OTHER)
Admission: RE | Admit: 2016-07-11 | Discharge: 2016-07-11 | Disposition: A | Discharge status: Home / Self Care | Payer: Medicare Other | Source: Ambulatory Visit | Attending: Family | Admitting: Family

## 2016-07-11 ENCOUNTER — Ambulatory Visit (INDEPENDENT_AMBULATORY_CARE_PROVIDER_SITE_OTHER): Payer: Medicare Other | Admitting: Family

## 2016-07-11 ENCOUNTER — Encounter: Payer: Self-pay | Admitting: Family

## 2016-07-11 VITALS — BP 123/79 | HR 60 | Temp 98.3°F | Resp 16 | Ht 69.0 in | Wt 222.4 lb

## 2016-07-11 DIAGNOSIS — R918 Other nonspecific abnormal finding of lung field: Secondary | ICD-10-CM | POA: Insufficient documentation

## 2016-07-11 DIAGNOSIS — B353 Tinea pedis: Secondary | ICD-10-CM | POA: Diagnosis not present

## 2016-07-11 DIAGNOSIS — R0789 Other chest pain: Secondary | ICD-10-CM

## 2016-07-11 DIAGNOSIS — R0602 Shortness of breath: Secondary | ICD-10-CM

## 2016-07-11 DIAGNOSIS — B20 Human immunodeficiency virus [HIV] disease: Secondary | ICD-10-CM | POA: Diagnosis not present

## 2016-07-11 LAB — D-DIMER, QUANTITATIVE: D-Dimer, Quant: 0.3 mcg/mL FEU (ref <0.50)

## 2016-07-11 NOTE — Patient Instructions (Addendum)
  Complete chest x ray on the first floor. Complete lab work prior to leaving. I have placed an order for an echocardiogram- this will look at your heart valves and your heart muscle to make sure everything is normal and strong. You will be contacted about your referral to cardiology so they can further evaluate your chest tightness. Please go to the ER if you develop recurrent chest pain or worsening shortness of breath.   I am going to have you complete some blood work as well to screen for blood clot in the lungs. If this is elevated, we will need to have you come back for a CT of your chest.

## 2016-07-11 NOTE — Progress Notes (Signed)
Subjective:    Patient ID: Matthew Fitzpatrick, male    DOB: 1980-07-23, 36 y.o.   MRN: [id number]  HPI   Matthew Fitzpatrick is a 36 yr old hearing impaired male who presents today with  chief complaint of intermittent SOB and tightness in his chest. A sign language interpretor is present for today's visit. He reports that SOB and chest tightness tends to occur along with stress and anxiety.  Reports stress is generally related to finances.    He reports that SOB does sometimes occur with exercise such as climbing stairs.  He denies swelling in his legs.  He does reports occasional numbness/cramping in his legs. He reports that chest tightness is mild and only occurs once in a while.  He denies associated wheezing.  He denies recent long travel trips.  Denies calf pain.   He reports that his feet are very dry and painful/cracked at times.     HIV disease- follows with Dr. [name].  Lab Results  Component Value Date   CD4TCELL 31 (L) 03/02/2016   CD4TABS 1,030 03/02/2016   GERD- he is maintained on prilosec once daily which helps his symptoms.   Hearing impaired since birth.   Review of Systems  Constitutional: Negative for unexpected weight change.  HENT: Positive for hearing loss. Negative for rhinorrhea.   Eyes: Negative for visual disturbance.  Respiratory: Negative for cough.   Cardiovascular: Negative for leg swelling.  Gastrointestinal:       Occasional constipation, occasional diarrhea- generally normal BMs  Genitourinary: Negative for dysuria and frequency.  Musculoskeletal: Negative for arthralgias and myalgias.  Skin:       Dry skin on feet  Neurological: Negative for headaches.  Hematological: Negative for adenopathy.  Psychiatric/Behavioral:       Reports no concerns about depression Denies significant anxiety.   Past Medical History:  Diagnosis Date  . CAP (community acquired pneumonia)   . Deaf   . GERD (gastroesophageal reflux disease)   . HIV disease  [facility])      Social History   Social History  . Marital status: Single    Spouse name: N/A  . Number of children: N/A  . Years of education: N/A   Occupational History  . Not on file.   Social History Main Topics  . Smoking status: Never Smoker  . Smokeless tobacco: Never Used  . Alcohol use No  . Drug use: No  . Sexual activity: Not on file     Comment: given condoms   Other Topics Concern  . Not on file   Social History Narrative   Lives alone   Mom lives with his sister in [place]   No children   Works in Pensions consultant in [place]   Enjoys working on [employer]   No pets       History reviewed. No pertinent surgical history.  Family History  Problem Relation Age of Onset  . Heart attack Father     No Known Allergies  Current Outpatient Prescriptions on File Prior to Visit  Medication Sig Dispense Refill  . darunavir-cobicistat (PREZCOBIX) 800-150 MG tablet Take 1 tablet by mouth daily. Swallow whole. Do NOT crush, break or chew tablets. Take with food. 30 tablet 5  . emtricitabine-tenofovir AF (DESCOVY) 200-25 MG tablet Take 1 tablet by mouth daily. 30 tablet 5  . omeprazole (PRILOSEC) 20 MG capsule TAKE 1 CAPSULE(20 MG) BY MOUTH DAILY 30 capsule 5   No current facility-administered medications  on file prior to visit.     BP 123/79 (BP Location: Left Arm, Cuff Size: Large)   Pulse 60   Temp 98.3 F (36.8 C) (Oral)   Resp 16   Ht 5' 9"  (1.753 m)   Wt 222 lb 6.4 oz (100.9 kg)   SpO2 99%   BMI 32.84 kg/m       Objective:   Physical Exam  Constitutional: He is oriented to person, place, and time. He appears well-developed and well-nourished. No distress.  HENT:  Head: Normocephalic and atraumatic.  Right Ear: Tympanic membrane and ear canal normal.  Left Ear: Tympanic membrane and ear canal normal.  Mouth/Throat: No oropharyngeal exudate, posterior oropharyngeal edema or posterior oropharyngeal erythema.    Cardiovascular: Normal rate and regular rhythm.   No murmur heard. Pulmonary/Chest: Effort normal and breath sounds normal. No respiratory distress. He has no wheezes. He has no rales.  Musculoskeletal: He exhibits no edema.  Neurological: He is alert and oriented to person, place, and time.  Skin: Skin is warm and dry.  Dry cracked skin noted on bilateral feet.   Psychiatric: He has a normal mood and affect. His behavior is normal. Thought content normal.          Assessment & Plan:  SOB/Chest tightness- EKG tracing is personally reviewed.  EKG notes NSR.  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  No acute changes. Appears unchanged compared to prior.  CXR is clear. D dimer is negative. Dad had CAD.  Will obtain 2D echo to further assess his heart and also refer to cardiology for further risk stratification.    HIV- Stable- management per ID.   Tinea pedis- recommended trial of otc topical lamisil

## 2016-07-19 ENCOUNTER — Ambulatory Visit (HOSPITAL_BASED_OUTPATIENT_CLINIC_OR_DEPARTMENT_OTHER)
Admission: RE | Admit: 2016-07-19 | Discharge: 2016-07-19 | Disposition: A | Discharge status: Home / Self Care | Payer: Medicare Other | Source: Ambulatory Visit | Attending: Family | Admitting: Family

## 2016-07-19 DIAGNOSIS — R0602 Shortness of breath: Secondary | ICD-10-CM | POA: Diagnosis not present

## 2016-07-19 LAB — ECHOCARDIOGRAM COMPLETE
CHL CUP DOP CALC LVOT VTI: 19.8 cm
E/e' ratio: 6.67
EWDT: 215 ms
FS: 40 % (ref 28–44)
IV/PV OW: 0.97
LA ID, A-P, ES: 34 mm
LA diam index: 1.57 cm/m2
LA vol: 59.4 mL
LAVOLA4C: 60.8 mL
LAVOLIN: 27.5 mL/m2
LEFT ATRIUM END SYS DIAM: 34 mm
LV E/e' medial: 6.67
LV PW d: 10.3 mm — AB (ref 0.6–1.1)
LV TDI E'MEDIAL: 7.72
LVDIAVOL: 111 mL (ref 62–150)
LVDIAVOLIN: 51 mL/m2
LVEEAVG: 6.67
LVELAT: 11.9 cm/s
LVOT area: 3.8 cm2
LVOT diameter: 22 mm
LVOT peak vel: 108 cm/s
LVOTSV: 75 mL
Lateral S' vel: 13.2 cm/s
MV Dec: 215
MV Peak grad: 3 mmHg
MV pk A vel: 53 m/s
MV pk E vel: 79.4 m/s
TAPSE: 26.7 mm
TDI e' lateral: 11.9

## 2016-07-19 NOTE — Progress Notes (Signed)
  Echocardiogram 2D Echocardiogram has been performed.  Tresa Res 07/19/2016, 2:56 PM

## 2016-07-20 ENCOUNTER — Encounter: Payer: Self-pay | Admitting: Family

## 2016-08-01 ENCOUNTER — Ambulatory Visit (INDEPENDENT_AMBULATORY_CARE_PROVIDER_SITE_OTHER): Payer: Medicare Other | Admitting: Family

## 2016-08-01 ENCOUNTER — Telehealth: Payer: Self-pay | Admitting: *Deleted

## 2016-08-01 ENCOUNTER — Encounter: Payer: Self-pay | Admitting: Family

## 2016-08-01 VITALS — BP 131/92 | HR 75 | Temp 98.2°F | Resp 18 | Ht 69.0 in | Wt 217.6 lb

## 2016-08-01 DIAGNOSIS — R197 Diarrhea, unspecified: Secondary | ICD-10-CM

## 2016-08-01 DIAGNOSIS — R109 Unspecified abdominal pain: Secondary | ICD-10-CM | POA: Diagnosis not present

## 2016-08-01 LAB — COMPREHENSIVE METABOLIC PANEL
ALBUMIN: 3.9 g/dL (ref 3.5–5.2)
ALK PHOS: 80 U/L (ref 39–117)
ALT: 18 U/L (ref 0–53)
AST: 17 U/L (ref 0–37)
BUN: 9 mg/dL (ref 6–23)
CHLORIDE: 101 meq/L (ref 96–112)
CO2: 30 mEq/L (ref 19–32)
Calcium: 9.2 mg/dL (ref 8.4–10.5)
Creatinine, Ser: 1.07 mg/dL (ref 0.40–1.50)
GFR: 100.14 mL/min (ref >60.00)
Glucose, Bld: 98 mg/dL (ref 70–99)
POTASSIUM: 3.2 meq/L — AB (ref 3.5–5.1)
SODIUM: 138 meq/L (ref 135–145)
Total Bilirubin: 0.5 mg/dL (ref 0.2–1.2)
Total Protein: 7.7 g/dL (ref 6.0–8.3)

## 2016-08-01 LAB — CBC WITH DIFFERENTIAL/PLATELET
BASOS PCT: 0.3 % (ref 0.0–3.0)
Basophils Absolute: 0 10*3/uL (ref 0.0–0.1)
EOS PCT: 0.8 % (ref 0.0–5.0)
Eosinophils Absolute: 0.1 10*3/uL (ref 0.0–0.7)
HEMATOCRIT: 40.1 % (ref 39.0–52.0)
HEMOGLOBIN: 13.9 g/dL (ref 13.0–17.0)
LYMPHS PCT: 38 % (ref 12.0–46.0)
Lymphs Abs: 2.7 10*3/uL (ref 0.7–4.0)
MCHC: 34.7 g/dL (ref 30.0–36.0)
MCV: 91.7 fl (ref 78.0–100.0)
MONO ABS: 1.8 10*3/uL — AB (ref 0.1–1.0)
MONOS PCT: 25.1 % — AB (ref 3.0–12.0)
Neutro Abs: 2.6 10*3/uL (ref 1.4–7.7)
Neutrophils Relative %: 35.8 % — ABNORMAL LOW (ref 43.0–77.0)
Platelets: 237 10*3/uL (ref 150.0–400.0)
RBC: 4.37 Mil/uL (ref 4.22–5.81)
RDW: 13.1 % (ref 11.5–15.5)
WBC: 7.2 10*3/uL (ref 4.0–10.5)

## 2016-08-01 LAB — LIPASE: Lipase: 29 U/L (ref 11.0–59.0)

## 2016-08-01 MED ORDER — METRONIDAZOLE 500 MG PO TABS: 500.0000 mg | ORAL_TABLET | Freq: Three times a day (TID) | ORAL | 0 refills | Status: DC

## 2016-08-01 MED ORDER — CIPROFLOXACIN HCL 500 MG PO TABS: 500.0000 mg | ORAL_TABLET | Freq: Two times a day (BID) | ORAL | 0 refills | Status: DC

## 2016-08-01 NOTE — Telephone Encounter (Signed)
Received call from [facility] at [facility], [phone]. She states pt is there for his CT w/contrast and they are unable to perform it as pt does not have a sign language interpreter with him. Advised Marcine that our scheduler was told that an interpreter would not be needed. She apologized for the inconvenience to the pt but states they do not have interpreter on staff and need to be able to communicate and know if he is having any allergic reaction to the IV contrast they will give. I called interpreter line through [facility] and was told that no interpreters were available for this afternoon. Notified Marcine, she states they had no interpreters available through their health system either and would let pt know we will contact him with new appt date / time and location. Spoke with referral coordinator here and we were able to get pt scheduled for CT at [place] in [facility] tomorrow at [place] and an interpreter will be provided. Message was left for pt on his home # and to call if any problems with new appointment.

## 2016-08-01 NOTE — Patient Instructions (Signed)
Please complete lab work prior to leaving.  Return stool studies at your earliest convenience.   Call if symptoms worsen or if not improved in 3 days. Go to the ER if you develop severe/worsening pain, fever >101 or if you see blood in the stool.

## 2016-08-01 NOTE — Telephone Encounter (Signed)
Spoke with patient. Advised him of CT at [facility]. Also discussed starting empiric cipro/flagyl.  He requested that rx go to [place].  Rx sent (rx was cancelled at specialty pharmacy).

## 2016-08-01 NOTE — Progress Notes (Signed)
Subjective:    Patient ID: Matthew Fitzpatrick, male    DOB: 30-Jan-1981, 36 y.o.   MRN: [id number]  HPI  Matthew Fitzpatrick is a 36 yr old male who presents today with chief complaint of diarrhea. Has been present x 6 days.  Reports frequent loose stools. Slightly less frequent today. Has to stay home due to the urgency and fear of an accident.  He denies fever. Denies blood in stool.  Reports some upper abdominal cramping.  Denies sick contacts or travel.  Denies recent antibiotic use.  A sign language interpretor is present for today's visit.   Review of Systems See HPI  Past Medical History:  Diagnosis Date  . CAP (community acquired pneumonia)   . Deaf   . GERD (gastroesophageal reflux disease)   . HIV disease [facility])      Social History   Social History  . Marital status: Single    Spouse name: N/A  . Number of children: N/A  . Years of education: N/A   Occupational History  . Not on file.   Social History Main Topics  . Smoking status: Never Smoker  . Smokeless tobacco: Never Used  . Alcohol use No  . Drug use: No  . Sexual activity: Not on file     Comment: given condoms   Other Topics Concern  . 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  Family History  Problem Relation Age of Onset  . Heart attack Father     No Known Allergies  Current Outpatient Prescriptions on File Prior to Visit  Medication Sig Dispense Refill  . darunavir-cobicistat (PREZCOBIX) 800-150 MG tablet Take 1 tablet by mouth daily. Swallow whole. Do NOT crush, break or chew tablets. Take with food. 30 tablet 5  . emtricitabine-tenofovir AF (DESCOVY) 200-25 MG tablet Take 1 tablet by mouth daily. 30 tablet 5  . omeprazole (PRILOSEC) 20 MG capsule TAKE 1 CAPSULE(20 MG) BY  MOUTH DAILY 30 capsule 5   No current facility-administered medications on file prior to visit.     BP (!) 131/92 (BP Location: Right Arm, Cuff Size: Normal)   Pulse 75   Temp 98.2 F (36.8 C) (Oral)   Resp 18   Ht 5' 9"  (1.753 m)   Wt 217 lb 9.6 oz (98.7 kg)   SpO2 97%   BMI 32.13 kg/m       Objective:   Physical Exam  Constitutional: He is oriented to person, place, and time. He appears well-developed and well-nourished. No distress.  HENT:  Head: Normocephalic and atraumatic.  Cardiovascular: Normal rate and regular rhythm.   No murmur heard. Pulmonary/Chest: Effort normal and breath sounds normal. No respiratory distress. He has no wheezes. He has no rales.  Abdominal: Soft. Bowel sounds are normal.  + tenderness without guarding RLQ and LLQ  Musculoskeletal: He exhibits no edema.  Neurological: He is alert and oriented to person, place, and time.  Skin: Skin is warm and dry.  Psychiatric: He has a normal mood and affect. His behavior is normal. Thought content normal.          Assessment & Plan:  Diarrhea/Abdominal pain- differential includes viral gastroenteritis, pancreatitis, diverticulitis, c diff  colitis. Obtain CBC, CMET, stool studies. Advised pt on adequate hydration. Further recommendations pending review of testing. He understands to go to the ER if  Go to the ER if you develop severe/worsening pain, fever >101 or if you see blood in the stool.

## 2016-08-02 ENCOUNTER — Ambulatory Visit (HOSPITAL_BASED_OUTPATIENT_CLINIC_OR_DEPARTMENT_OTHER)
Admission: RE | Admit: 2016-08-02 | Discharge: 2016-08-02 | Disposition: A | Discharge status: Home / Self Care | Payer: Medicare Other | Source: Ambulatory Visit | Attending: Family | Admitting: Family

## 2016-08-02 ENCOUNTER — Telehealth: Payer: Self-pay | Admitting: Family

## 2016-08-02 ENCOUNTER — Encounter (HOSPITAL_BASED_OUTPATIENT_CLINIC_OR_DEPARTMENT_OTHER): Payer: Self-pay

## 2016-08-02 DIAGNOSIS — R911 Solitary pulmonary nodule: Secondary | ICD-10-CM | POA: Diagnosis not present

## 2016-08-02 DIAGNOSIS — R109 Unspecified abdominal pain: Secondary | ICD-10-CM | POA: Insufficient documentation

## 2016-08-02 DIAGNOSIS — R197 Diarrhea, unspecified: Secondary | ICD-10-CM | POA: Diagnosis not present

## 2016-08-02 LAB — OVA AND PARASITE EXAMINATION: OP: NONE SEEN

## 2016-08-02 LAB — CLOSTRIDIUM DIFFICILE BY PCR: CDIFFPCR: NOT DETECTED

## 2016-08-02 MED ORDER — IOPAMIDOL (ISOVUE-300) INJECTION 61%
100.0000 mL | Freq: Once | INTRAVENOUS | Status: AC | PRN
  Administered 2016-08-02: 100 mL via INTRAVENOUS

## 2016-08-02 NOTE — Telephone Encounter (Signed)
Attempted to reach pt and left message through relay interpreter to have pt return my call.

## 2016-08-02 NOTE — Telephone Encounter (Signed)
Please let pt know that CT shows some inflammation of his colon, likely due to infection. Recommend that he continue cipro/flagyl. Follow up with me in 1 week. Small lung nodule noted on CT but this is not concerning since he is not a smoker.  How is he feeling today?

## 2016-08-04 LAB — STOOL CULTURE

## 2016-08-05 MED ORDER — POTASSIUM CHLORIDE CRYS ER 20 MEQ PO TBCR: EXTENDED_RELEASE_TABLET | ORAL | 0 refills | Status: DC

## 2016-08-05 NOTE — Telephone Encounter (Signed)
Potassium is low.  Likely related to diarrhea.  Spoke with patient. He reports about 5 loose stools a day. Advised him that his stool studies are negative. OK to try otc immodium prn.  Take potassium today. He notes a small amount of blood after BM. Advised patient likely due to hemorrhoids but if severe bleeding needs to go to the ED. Pt verbalizes understanding. He is reminded to follow up as scheduled on riday.  Call was interpreted by sign language interpreter.   I did not see until after the call that you did not get in touch with him re: ct results.  Please let him know on Monday.

## 2016-08-07 NOTE — Telephone Encounter (Signed)
Notified pt of CT result via relay interpreter and he voices understanding. He will keep f/u as scheduled on Friday.

## 2016-08-10 ENCOUNTER — Ambulatory Visit: Payer: Medicare Other | Admitting: Internal Medicine

## 2016-08-11 ENCOUNTER — Ambulatory Visit (INDEPENDENT_AMBULATORY_CARE_PROVIDER_SITE_OTHER): Payer: Medicare Other | Admitting: *Deleted

## 2016-08-11 ENCOUNTER — Encounter: Payer: Medicare Other | Admitting: Family

## 2016-08-11 ENCOUNTER — Encounter: Payer: Self-pay | Admitting: *Deleted

## 2016-08-11 VITALS — BP 130/65 | HR 70 | Ht 69.0 in | Wt 220.8 lb

## 2016-08-11 DIAGNOSIS — Z0000 Encounter for general adult medical examination without abnormal findings: Secondary | ICD-10-CM | POA: Diagnosis not present

## 2016-08-11 NOTE — Progress Notes (Signed)
Subjective:   Matthew Fitzpatrick is a 36 y.o. male who presents for an Initial Medicare Annual Wellness Visit.  Accompanied by sign language interpreter.  The Patient was informed that the wellness visit is to identify future health risk and educate and initiate measures that can reduce risk for increased disease through the lifespan.   Describes health as fair, good or great? okay   Review of Systems  No ROS.  Medicare Wellness Visit. Additional risk factors are reflected in the social history.  Cardiac Risk Factors include: male gender;advanced age (>81mn, >>21women);dyslipidemia;obesity (BMI >30kg/m2);sedentary lifestyle Sleep patterns: States 2 of his meds help him sleep. Sleeps 7-8 hrs per night. Feels rested.  Home Safety/Smoke Alarms: Feels safe in home. Smoke alarms in place.  Living environment; residence and Firearm Safety: Lives alone. No stairs. No guns. Seat Belt Safety/Bike Helmet: Wears seat belt.  Male:   CCS-  n/a PSA- No results found for: PSA      Objective:    Today's Vitals   08/11/16 1111 08/11/16 1113  BP: 130/65   Pulse: 70   SpO2: 97%   Weight: 220 lb 12.8 oz (100.2 kg)   Height: 5' 9"  (1.753 m)   PainSc:  3    Body mass index is 32.61 kg/m.  Current Medications (verified) Outpatient Encounter Prescriptions as of 08/11/2016  Medication Sig  . ciprofloxacin (CIPRO) 500 MG tablet Take 1 tablet (500 mg total) by mouth 2 (two) times daily.  . darunavir-cobicistat (PREZCOBIX) 800-150 MG tablet Take 1 tablet by mouth daily. Swallow whole. Do NOT crush, break or chew tablets. Take with food.  .Marland Kitchenemtricitabine-tenofovir AF (DESCOVY) 200-25 MG tablet Take 1 tablet by mouth daily.  . metroNIDAZOLE (FLAGYL) 500 MG tablet Take 1 tablet (500 mg total) by mouth 3 (three) times daily.  .Marland Kitchenomeprazole (PRILOSEC) 20 MG capsule TAKE 1 CAPSULE(20 MG) BY MOUTH DAILY  . potassium chloride SA (K-DUR,KLOR-CON) 20 MEQ tablet 2 tabs by mouth now   No  facility-administered encounter medications on file as of 08/11/2016.     Allergies (verified) Patient has no known allergies.   History: Past Medical History:  Diagnosis Date  . CAP (community acquired pneumonia)   . Deaf   . GERD (gastroesophageal reflux disease)   . HIV disease ([place]    History reviewed. No pertinent surgical history. Family History  Problem Relation Age of Onset  . Heart attack Father    Social History   Occupational History  . Not on file.   Social History Main Topics  . Smoking status: Never Smoker  . Smokeless tobacco: Never Used  . Alcohol use No  . Drug use: No  . Sexual activity: No     Comment: given condoms   Tobacco Counseling Counseling given: No   Activities of Daily Living In your present state of health, do you have any difficulty performing the following activities: 08/11/2016  Hearing? Y  Vision? N  Difficulty concentrating or making decisions? N  Walking or climbing stairs? N  Dressing or bathing? N  Doing errands, shopping? N  Preparing Food and eating ? N  Using the Toilet? N  In the past six months, have you accidently leaked urine? N  Do you have problems with loss of bowel control? N  Managing your Medications? N  Managing your Finances? N  Housekeeping or managing your Housekeeping? N  Some recent data might be hidden    Immunizations and Health Maintenance Immunization History  Administered Date(s)  Administered  . Hepatitis B 04/28/2003, 06/10/2003, 07/30/2003  . Influenza Split 01/26/2011, 02/15/2012  . Influenza Whole 03/26/2006, 12/25/2006, 12/23/2008, 11/02/2009  . Influenza,inj,Quad PF,36+ Mos 02/12/2014, 03/16/2015, 04/20/2016  . Pneumococcal Polysaccharide-23 04/28/2003, 11/02/2009   Health Maintenance Due  Topic Date Due  . TETANUS/TDAP  10/26/1998    Patient Care Team: [name], NP as PCP - General (Internal Medicine) Michel Bickers, MD as PCP - Infectious Diseases (Infectious  Diseases)  Indicate any recent Medical Services you may have received from other than [facility] providers in the past year (date may be approximate).    Assessment:   This is a routine wellness examination for Matthew Fitzpatrick. Physical assessment deferred to PCP.   Hearing/Vision screen  Visual Acuity Screening   Right eye Left eye Both eyes  Without correction: 20/20 20/20 20/20   With correction:     Hearing Screening Comments: Pt is deaf   Dietary issues and exercise activities discussed: Current Exercise Habits: The patient does not participate in regular exercise at present, Exercise limited by: None identified   Diet (meal preparation, eat out, water intake, caffeinated beverages, dairy products, fruits and vegetables): in general, a "healthy" diet       Goals    . He would just like to feel better.      Depression Screen PHQ 2/9 Scores 08/11/2016 04/20/2016 09/22/2014 08/12/2013  PHQ - 2 Score 0 0 0 0    Fall Risk Fall Risk  08/11/2016 04/20/2016 08/12/2015 09/22/2014 08/12/2013  Falls in the past year? No No No No No    Cognitive Function: Ad8 score reviewed for issues:  Issues making decisions:no  Less interest in hobbies / activities:no  Repeats questions, stories (family complaining):no  Trouble using ordinary gadgets (microwave, computer, phone):no  Forgets the month or year: no  Mismanaging finances: no  Remembering appts:no  Daily problems with thinking and/or memory:no Ad8 score is=0         Screening Tests Health Maintenance  Topic Date Due  . TETANUS/TDAP  10/26/1998  . INFLUENZA VACCINE  09/27/2016  . HIV Screening  Completed        Plan:    Follow up with [name], NP as scheduled.  Continue to eat heart healthy diet (full of fruits, vegetables, whole grains, lean protein, water--limit salt, fat, and sugar intake) and increase physical activity as tolerated.  Continue doing brain stimulating activities (puzzles, reading, adult coloring  books, staying active) to keep memory sharp.     I have personally reviewed and noted the following in the patient's chart:   . Medical and social history . Use of alcohol, tobacco or illicit drugs  . Current medications and supplements . Functional ability and status . Nutritional status . Physical activity . Advanced directives . List of other physicians . Hospitalizations, surgeries, and ER visits in previous 12 months . Vitals . Screenings to include cognitive, depression, and falls . Referrals and appointments  In addition, I have reviewed and discussed with patient certain preventive protocols, quality metrics, and best practice recommendations. A written personalized care plan for preventive services as well as general preventive health recommendations were provided to patient.     Shela Nevin, [place]   08/11/2016

## 2016-08-11 NOTE — Progress Notes (Signed)
Noted and agree. 

## 2016-08-11 NOTE — Patient Instructions (Signed)
Matthew Fitzpatrick , Thank you for taking time to come for your Medicare Wellness Visit. I appreciate your ongoing commitment to your health goals. Please review the following plan we discussed and let me know if I can assist you in the future.   These are the goals we discussed: Goals    . He would just like to feel better.       This is a list of the screening recommended for you and due dates:  Health Maintenance  Topic Date Due  . Tetanus Vaccine  10/26/1998  . Flu Shot  09/27/2016  . HIV Screening  Completed     Follow up with [name], NP as scheduled.  Continue to eat heart healthy diet (full of fruits, vegetables, whole grains, lean protein, water--limit salt, fat, and sugar intake) and increase physical activity as tolerated.  Continue doing brain stimulating activities (puzzles, reading, adult coloring books, staying active) to keep memory sharp.    Health Maintenance, Male A healthy lifestyle and preventive care is important for your health and wellness. Ask your health care provider about what schedule of regular examinations is right for you. What should I know about weight and diet? Eat a Healthy Diet  Eat plenty of vegetables, fruits, whole grains, low-fat dairy products, and lean protein.  Do not eat a lot of foods high in solid fats, added sugars, or salt.  Maintain a Healthy Weight Regular exercise can help you achieve or maintain a healthy weight. You should:  Do at least 150 minutes of exercise each week. The exercise should increase your heart rate and make you sweat (moderate-intensity exercise).  Do strength-training exercises at least twice a week.  Watch Your Levels of Cholesterol and Blood Lipids  Have your blood tested for lipids and cholesterol every 5 years starting at 37 years of age. If you are at high risk for heart disease, you should start having your blood tested when you are 37 years old. You may need to have your cholesterol levels  checked more often if: ? Your lipid or cholesterol levels are high. ? You are older than 37 years of age. ? You are at high risk for heart disease.  What should I know about cancer screening? Many types of cancers can be detected early and may often be prevented. Lung Cancer  You should be screened every year for lung cancer if: ? You are a current smoker who has smoked for at least 30 years. ? You are a former smoker who has quit within the past 15 years.  Talk to your health care provider about your screening options, when you should start screening, and how often you should be screened.  Colorectal Cancer  Routine colorectal cancer screening usually begins at 37 years of age and should be repeated every 5-10 years until you are 37 years old. You may need to be screened more often if early forms of precancerous polyps or small growths are found. Your health care provider may recommend screening at an earlier age if you have risk factors for colon cancer.  Your health care provider may recommend using home test kits to check for hidden blood in the stool.  A small camera at the end of a tube can be used to examine your colon (sigmoidoscopy or colonoscopy). This checks for the earliest forms of colorectal cancer.  Prostate and Testicular Cancer  Depending on your age and overall health, your health care provider may do certain tests to screen for  prostate and testicular cancer.  Talk to your health care provider about any symptoms or concerns you have about testicular or prostate cancer.  Skin Cancer  Check your skin from head to toe regularly.  Tell your health care provider about any new moles or changes in moles, especially if: ? There is a change in a mole's size, shape, or color. ? You have a mole that is larger than a pencil eraser.  Always use sunscreen. Apply sunscreen liberally and repeat throughout the day.  Protect yourself by wearing long sleeves, pants, a  wide-brimmed hat, and sunglasses when outside.  What should I know about heart disease, diabetes, and high blood pressure?  If you are 66-72 years of age, have your blood pressure checked every 3-5 years. If you are 43 years of age or older, have your blood pressure checked every year. You should have your blood pressure measured twice-once when you are at a hospital or clinic, and once when you are not at a hospital or clinic. Record the average of the two measurements. To check your blood pressure when you are not at a hospital or clinic, you can use: ? An automated blood pressure machine at a pharmacy. ? A home blood pressure monitor.  Talk to your health care provider about your target blood pressure.  If you are between 42-25 years old, ask your health care provider if you should take aspirin to prevent heart disease.  Have regular diabetes screenings by checking your fasting blood sugar level. ? If you are at a normal weight and have a low risk for diabetes, have this test once every three years after the age of 15. ? If you are overweight and have a high risk for diabetes, consider being tested at a younger age or more often.  A one-time screening for abdominal aortic aneurysm (AAA) by ultrasound is recommended for men aged 82-75 years who are current or former smokers. What should I know about preventing infection? Hepatitis B If you have a higher risk for hepatitis B, you should be screened for this virus. Talk with your health care provider to find out if you are at risk for hepatitis B infection. Hepatitis C Blood testing is recommended for:  Everyone born from [birth year] through 1966.  Anyone with known risk factors for hepatitis C.  Sexually Transmitted Diseases (STDs)  You should be screened each year for STDs including gonorrhea and chlamydia if: ? You are sexually active and are younger than 37 years of age. ? You are older than 37 years of age and your health care provider  tells you that you are at risk for this type of infection. ? Your sexual activity has changed since you were last screened and you are at an increased risk for chlamydia or gonorrhea. Ask your health care provider if you are at risk.  Talk with your health care provider about whether you are at high risk of being infected with HIV. Your health care provider may recommend a prescription medicine to help prevent HIV infection.  What else can I do?  Schedule regular health, dental, and eye exams.  Stay current with your vaccines (immunizations).  Do not use any tobacco products, such as cigarettes, chewing tobacco, and e-cigarettes. If you need help quitting, ask your health care provider.  Limit alcohol intake to no more than 2 drinks per day. One drink equals 12 ounces of beer, 5 ounces of wine, or 1 ounces of hard liquor.  Do not  use street drugs.  Do not share needles.  Ask your health care provider for help if you need support or information about quitting drugs.  Tell your health care provider if you often feel depressed.  Tell your health care provider if you have ever been abused or do not feel safe at home. This information is not intended to replace advice given to you by your health care provider. Make sure you discuss any questions you have with your health care provider. Document Released: 08/12/2007 Document Revised: 10/13/2015 Document Reviewed: 11/17/2014 Elsevier Interactive Patient Education  [employer].

## 2016-08-16 ENCOUNTER — Telehealth: Payer: Self-pay | Admitting: Family

## 2016-08-16 NOTE — Telephone Encounter (Signed)
Patient called stating that when seeing Glenard Haring 6/15, was given an appt for 6/21 with Melissa. No appt has been scheduled and pt request a call back from [place] because she gave the card.

## 2016-08-16 NOTE — Telephone Encounter (Signed)
Was able to correct pt's appt. Called patient and left message to return call.

## 2016-08-16 NOTE — Telephone Encounter (Signed)
Called patient and left message to return call

## 2016-08-16 NOTE — Telephone Encounter (Signed)
Spoke with pt via phone interpreter. Pt aware appt is 08/17/16 @945 .

## 2016-08-17 ENCOUNTER — Ambulatory Visit (INDEPENDENT_AMBULATORY_CARE_PROVIDER_SITE_OTHER): Payer: Medicare Other | Admitting: Family

## 2016-08-17 ENCOUNTER — Encounter: Payer: Self-pay | Admitting: Family

## 2016-08-17 VITALS — BP 136/81 | HR 75 | Temp 98.5°F | Resp 176 | Ht 69.0 in | Wt 223.0 lb

## 2016-08-17 DIAGNOSIS — E876 Hypokalemia: Secondary | ICD-10-CM

## 2016-08-17 DIAGNOSIS — K529 Noninfective gastroenteritis and colitis, unspecified: Secondary | ICD-10-CM

## 2016-08-17 DIAGNOSIS — R911 Solitary pulmonary nodule: Secondary | ICD-10-CM

## 2016-08-17 HISTORY — DX: Solitary pulmonary nodule: R91.1

## 2016-08-17 MED ORDER — METRONIDAZOLE 500 MG PO TABS: 500.0000 mg | ORAL_TABLET | Freq: Three times a day (TID) | ORAL | 0 refills | Status: DC

## 2016-08-17 MED ORDER — DIPHENOXYLATE-ATROPINE 2.5-0.025 MG PO TABS: 1.0000 | ORAL_TABLET | Freq: Four times a day (QID) | ORAL | 0 refills | Status: DC | PRN

## 2016-08-17 NOTE — Patient Instructions (Addendum)
You may use lomotil 1 tab 4 times daily as needed for diarrhea. Please start the metronidazole (antibiotic).  Please complete lab work prior to leaving. We will work on getting you in with Gastroenterology for further evaluation of your abdominal pain and diarrhea.   Go to the ER if severe/worsening abdominal pain or if you have worsening diarrhea.

## 2016-08-17 NOTE — Progress Notes (Addendum)
Subjective:    Patient ID: Matthew Fitzpatrick, male    DOB: 09-Mar-1980, 36 y.o.   MRN: [id number]  HPI  Matthew Fitzpatrick is a 36 yr old HIV positive male who presents today for follow up of his colitis. A sign language interpreter is present for today's visit.  Last visit he reported diarrhea.  CT scan revealed borderline mild wall thickening in the distal sigmoid colon and rectum. He was treated empirically with cipro and flagyl.  C diff, stool culture and O and P were negative. Lipase was WNL. WBC was WNL. He was noted to be mildly hypokalemic with a potassium of 3.2.  He was given a dose of kdur for his hypokalemia.   He reports some epigastric pain.    Incidental finding of a 3 mm pulmonary nodule was noted. (he is a never smoker).   Review of Systems See HPI  Past Medical History:  Diagnosis Date  . CAP (community acquired pneumonia)   . Deaf   . GERD (gastroesophageal reflux disease)   . HIV disease [facility])      Social History   Social History  . Marital status: Single    Spouse name: N/A  . Number of children: N/A  . Years of education: N/A   Occupational History  . Not on file.   Social History Main Topics  . Smoking status: Never Smoker  . Smokeless tobacco: Never Used  . Alcohol use No  . Drug use: No  . Sexual activity: No     Comment: given condoms   Other Topics Concern  . 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  Family History  Problem Relation Age of Onset  . Heart attack Father     No Known Allergies  Current Outpatient Prescriptions on File Prior to Visit  Medication Sig Dispense Refill  . darunavir-cobicistat (PREZCOBIX) 800-150 MG tablet Take 1 tablet by mouth daily. Swallow whole. Do NOT crush, break or chew tablets.  Take with food. 30 tablet 5  . emtricitabine-tenofovir AF (DESCOVY) 200-25 MG tablet Take 1 tablet by mouth daily. 30 tablet 5  . omeprazole (PRILOSEC) 20 MG capsule TAKE 1 CAPSULE(20 MG) BY MOUTH DAILY 30 capsule 5  . potassium chloride SA (K-DUR,KLOR-CON) 20 MEQ tablet 2 tabs by mouth now 2 tablet 0   No current facility-administered medications on file prior to visit.     BP 136/81 (BP Location: Left Arm, Cuff Size: Normal)   Pulse 75   Temp 98.5 F (36.9 C) (Oral)   Resp (!) 176   Ht 5' 9"  (1.753 m)   Wt 223 lb (101.2 kg)   SpO2 97%   BMI 32.93 kg/m       Objective:   Physical Exam  Constitutional: He is oriented to person, place, and time. He appears well-developed and well-nourished. No distress.  HENT:  Head: Normocephalic and atraumatic.  Cardiovascular: Normal rate and regular rhythm.   No murmur heard. Pulmonary/Chest: Effort normal and breath sounds normal. No respiratory distress. He has no wheezes. He has no rales.  Abdominal: Soft. Bowel sounds are normal. He exhibits no distension. There is tenderness in the right upper quadrant and left upper quadrant.  There is no rigidity and no guarding.  Musculoskeletal: He exhibits no edema.  Neurological: He is alert and oriented to person, place, and time.  Skin: Skin is warm and dry.  Psychiatric: He has a normal mood and affect. His behavior is normal. Thought content normal.          Assessment & Plan:  Colitis- diarrhea continues. Has decreased from 5 times a day to 3 times a day. He never picked up the metronidazole rx.  I have asked him to pick up metronidazole and start.  I have also given him an rx for lomotil.  Will refer to GI for for further evaluation.   Hypokalemia- obtain follow up bmet.   Pulmonary nodule- small, low risk.  No further follow up indicated.

## 2016-08-18 ENCOUNTER — Encounter: Payer: Self-pay | Admitting: Internal Medicine

## 2016-08-24 ENCOUNTER — Ambulatory Visit: Payer: Medicare Other | Admitting: Internal Medicine

## 2016-09-07 ENCOUNTER — Other Ambulatory Visit: Payer: Medicare Other

## 2016-09-21 ENCOUNTER — Encounter: Payer: Self-pay | Admitting: Internal Medicine

## 2016-09-21 ENCOUNTER — Ambulatory Visit: Payer: Medicare Other | Admitting: Internal Medicine

## 2016-09-21 ENCOUNTER — Ambulatory Visit (INDEPENDENT_AMBULATORY_CARE_PROVIDER_SITE_OTHER): Payer: Medicare Other | Admitting: Internal Medicine

## 2016-09-21 DIAGNOSIS — K089 Disorder of teeth and supporting structures, unspecified: Secondary | ICD-10-CM

## 2016-09-21 DIAGNOSIS — B20 Human immunodeficiency virus [HIV] disease: Secondary | ICD-10-CM | POA: Diagnosis not present

## 2016-09-21 DIAGNOSIS — N529 Male erectile dysfunction, unspecified: Secondary | ICD-10-CM | POA: Diagnosis present

## 2016-09-21 DIAGNOSIS — K529 Noninfective gastroenteritis and colitis, unspecified: Secondary | ICD-10-CM

## 2016-09-21 HISTORY — DX: Disorder of teeth and supporting structures, unspecified: K08.9

## 2016-09-21 LAB — COMPREHENSIVE METABOLIC PANEL
ALK PHOS: 113 U/L (ref 40–115)
ALT: 19 U/L (ref 9–46)
AST: 17 U/L (ref 10–40)
Albumin: 3.7 g/dL (ref 3.6–5.1)
BUN: 10 mg/dL (ref 7–25)
CALCIUM: 8.8 mg/dL (ref 8.6–10.3)
CO2: 24 mmol/L (ref 20–31)
Chloride: 101 mmol/L (ref 98–110)
Creat: 0.97 mg/dL (ref 0.60–1.35)
Glucose, Bld: 104 mg/dL — ABNORMAL HIGH (ref 65–99)
Potassium: 3.6 mmol/L (ref 3.5–5.3)
Sodium: 137 mmol/L (ref 135–146)
TOTAL PROTEIN: 7.1 g/dL (ref 6.1–8.1)
Total Bilirubin: 0.2 mg/dL (ref 0.2–1.2)

## 2016-09-21 LAB — CBC
HCT: 42.9 % (ref 38.5–50.0)
HEMOGLOBIN: 14.5 g/dL (ref 13.2–17.1)
MCH: 31.8 pg (ref 27.0–33.0)
MCHC: 33.8 g/dL (ref 32.0–36.0)
MCV: 94.1 fL (ref 80.0–100.0)
MPV: 9.9 fL (ref 7.5–12.5)
Platelets: 256 10*3/uL (ref 140–400)
RBC: 4.56 MIL/uL (ref 4.20–5.80)
RDW: 13.8 % (ref 11.0–15.0)
WBC: 6 10*3/uL (ref 3.8–10.8)

## 2016-09-21 MED ORDER — SILDENAFIL CITRATE 50 MG PO TABS: 50.0000 mg | ORAL_TABLET | Freq: Every day | ORAL | 5 refills | Status: DC | PRN

## 2016-09-21 MED ORDER — SILDENAFIL CITRATE 25 MG PO TABS: 25.0000 mg | ORAL_TABLET | Freq: Every day | ORAL | 0 refills | Status: DC | PRN

## 2016-09-21 NOTE — Assessment & Plan Note (Signed)
I agreed to a trial of sildenafil. He was also given a spinal condoms.

## 2016-09-21 NOTE — Addendum Note (Signed)
Addended by: Michel Bickers on: 09/21/2016 10:46 AM   Modules accepted: Orders

## 2016-09-21 NOTE — Progress Notes (Signed)
Patient Active Problem List   Diagnosis Date Noted  . Human immunodeficiency virus (HIV) disease ([place]) 03/02/2006    Priority: High  . Poor dentition 09/21/2016  . Pulmonary nodule 08/17/2016  . Obesity (BMI 30.0-34.9) 04/20/2016  . Acute gastroenteritis 08/12/2015  . Acute conjunctivitis 02/12/2014  . Dyslipidemia 07/02/2012  . GERD (gastroesophageal reflux disease) 06/28/2010  . 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 ANXIETY 03/02/2006  . DEPRESSION 03/02/2006  . ALLERGIC RHINITIS 03/02/2006    Patient's Medications  New Prescriptions   SILDENAFIL (VIAGRA) 25 MG TABLET    Take 1 tablet (25 mg total) by mouth daily as needed for erectile dysfunction.  Previous Medications   DARUNAVIR-COBICISTAT (PREZCOBIX) 800-150 MG TABLET    Take 1 tablet by mouth daily. Swallow whole. Do NOT crush, break or chew tablets. Take with food.   DIPHENOXYLATE-ATROPINE (LOMOTIL) 2.5-0.025 MG TABLET    Take 1 tablet by mouth 4 (four) times daily as needed for diarrhea or loose stools.   EMTRICITABINE-TENOFOVIR AF (DESCOVY) 200-25 MG TABLET    Take 1 tablet by mouth daily.   METRONIDAZOLE (FLAGYL) 500 MG TABLET    Take 1 tablet (500 mg total) by mouth 3 (three) times daily.   OMEPRAZOLE (PRILOSEC) 20 MG CAPSULE    TAKE 1 CAPSULE(20 MG) BY MOUTH DAILY   POTASSIUM CHLORIDE SA (K-DUR,KLOR-CON) 20 MEQ TABLET    2 tabs by mouth now  Modified Medications   No medications on file  Discontinued Medications   No medications on file    Subjective: Matthew Fitzpatrick is in for his routine HIV follow-up visit. He has not had any problems obtaining, taking or tolerating his Descovy and Prezcobix. He denies missing any doses since his last visit. He recently developed diarrhea but this resolved after taking a brief course of metronidazole. He continues to have problems with erectile dysfunction. He says that he has not been sexually active in the past year. He has tried  multiple over-the-counter/Internet products but they have not helped.  Review of Systems: Review of Systems  Constitutional: Negative for chills, diaphoresis, fever, malaise/fatigue and weight loss.  HENT: Negative for sore throat.   Respiratory: Negative for cough, sputum production and shortness of breath.   Cardiovascular: Negative for chest pain.  Gastrointestinal: Negative for abdominal pain, diarrhea, heartburn, nausea and vomiting.  Genitourinary: Negative for dysuria and frequency.       As noted in history of present illness.  Musculoskeletal: Negative for joint pain and myalgias.  Skin: Negative for rash.  Neurological: Negative for dizziness and headaches.  Psychiatric/Behavioral: Negative for depression and substance abuse. The patient is not nervous/anxious.     Past Medical History:  Diagnosis Date  . CAP (community acquired pneumonia)   . Deaf   . GERD (gastroesophageal reflux disease)   . HIV disease [facility])     Social History  Substance Use Topics  . Smoking status: Never Smoker  . Smokeless tobacco: Never Used  . Alcohol use No    Family History  Problem Relation Age of Onset  . Heart attack Father     No Known Allergies  Objective:  Vitals:   09/21/16 1006  BP: 133/80  Pulse: 66  Temp: 97.9 F (36.6 C)  TempSrc: Oral  Weight: 227 lb (103 kg)   Body mass index is 33.52 kg/m.  Physical Exam  Constitutional: He is oriented to person, place, and time.  He was examined with  the assistance of the video sign language interpreter.  HENT:  Mouth/Throat: No oropharyngeal exudate.  He has very poor dentition. He has many missing teeth. Remaining teeth are broken and [place] down to the gumline.  Eyes: Conjunctivae are normal.  Cardiovascular: Normal rate and regular rhythm.   No murmur heard. Pulmonary/Chest: Effort normal and breath sounds normal.  Abdominal: Soft. He exhibits no mass. There is no tenderness.  Musculoskeletal: Normal range of  motion.  Neurological: He is alert and oriented to person, place, and time.  Skin: No rash noted.  Psychiatric: Mood and affect normal.    Lab Results Lab Results  Component Value Date   WBC 7.2 08/01/2016   HGB 13.9 08/01/2016   HCT 40.1 08/01/2016   MCV 91.7 08/01/2016   PLT 237.0 08/01/2016    Lab Results  Component Value Date   CREATININE 1.07 08/01/2016   BUN 9 08/01/2016   NA 138 08/01/2016   K 3.2 (L) 08/01/2016   CL 101 08/01/2016   CO2 30 08/01/2016    Lab Results  Component Value Date   ALT 18 08/01/2016   AST 17 08/01/2016   ALKPHOS 80 08/01/2016   BILITOT 0.5 08/01/2016    Lab Results  Component Value Date   CHOL 218 (H) 03/02/2016   HDL 45 03/02/2016   LDLCALC 145 (H) 03/02/2016   TRIG 141 03/02/2016   CHOLHDL 4.8 03/02/2016   Lab Results  Component Value Date   LABRPR NON REAC 03/02/2016   HIV 1 RNA Quant (copies/mL)  Date Value  03/02/2016 59 (H)  07/29/2015 42 (H)  03/16/2015 <20   CD4 T Cell Abs (/uL)  Date Value  03/02/2016 1,030  07/29/2015 1,040  03/16/2015 1,150     Problem List Items Addressed This Visit      High   Human immunodeficiency virus (HIV) disease ([place])    He tells me that his adherence is much better over the past 6 months. He will continue Descovy and Prezcobix and get repeat lab work today. He will follow-up in 6 months.      Relevant Orders   T-helper cell (CD4)- ([facility] clinic only)   HIV 1 RNA quant-no reflex-bld   RPR   Comprehensive metabolic panel   CBC     Unprioritized   Acute gastroenteritis    His diarrhea has resolved.      ERECTILE DYSFUNCTION, ORGANIC    I agreed to a trial of sildenafil. He was also given a spinal condoms.      Relevant Medications   sildenafil (VIAGRA) 25 MG tablet   Poor dentition    We will refer him to our dental clinic.           Michel Bickers, MD [facility] [place] [facility] [phone] pager   [phone]  cell 09/21/2016, 10:33 AM

## 2016-09-21 NOTE — Assessment & Plan Note (Signed)
His diarrhea has resolved.

## 2016-09-21 NOTE — Assessment & Plan Note (Signed)
He tells me that his adherence is much better over the past 6 months. He will continue Descovy and Prezcobix and get repeat lab work today. He will follow-up in 6 months.

## 2016-09-21 NOTE — Assessment & Plan Note (Signed)
We will refer him to our dental clinic.

## 2016-09-22 LAB — T-HELPER CELL (CD4) - (RCID CLINIC ONLY)
CD4 % Helper T Cell: 33 % (ref 33–55)
CD4 T Cell Abs: 1250 /uL (ref 400–2700)

## 2016-09-22 LAB — RPR

## 2016-09-26 LAB — HIV-1 RNA QUANT-NO REFLEX-BLD
HIV 1 RNA Quant: <20 copies/mL — AB
HIV-1 RNA Quant, Log: <1.3 Log copies/mL — AB

## 2016-09-26 NOTE — Telephone Encounter (Signed)
error 

## 2016-10-06 ENCOUNTER — Encounter: Payer: Self-pay | Admitting: Gastroenterology

## 2016-10-09 ENCOUNTER — Encounter: Payer: Self-pay | Admitting: Internal Medicine

## 2016-10-12 ENCOUNTER — Ambulatory Visit (INDEPENDENT_AMBULATORY_CARE_PROVIDER_SITE_OTHER): Payer: Medicare Other | Admitting: Internal Medicine

## 2016-10-12 ENCOUNTER — Encounter: Payer: Self-pay | Admitting: Internal Medicine

## 2016-10-12 VITALS — BP 116/64 | HR 87 | Ht 68.0 in | Wt 218.0 lb

## 2016-10-12 DIAGNOSIS — K6389 Other specified diseases of intestine: Secondary | ICD-10-CM | POA: Diagnosis not present

## 2016-10-12 NOTE — Progress Notes (Addendum)
Matthew Fitzpatrick 36 y.o. [birth year] [id number]  Assessment & Plan:   Encounter Diagnosis  Name Primary?  . Proctosigmoiditis Yes   1. Schedule colonoscopy on 11/10/2016.  Matthew Fitzpatrick is recovering from a recent episode of proctocolitis, treated with metronidazole.  Because of some lingering symptoms of diarrhea, and inflammation noted on physical exam we are going to go ahead with a colonoscopy.  It is possible this was an isolated infectious colitis, or that there may be underlying inflammation that is chronic in nature. ? Ulcerative colitis ? Proctitis    Subjective:   Chief Complaint: Follow up colitis. Still having diarrhea and pain.  HPI Matthew Fitzpatrick is a pleasant 36 yo african [place] gentleman, with past medical history significant for HIV ( treated by Dr. [name]), dislipemia, GERD and recent CT scan (08/02/2016) due to abdominal pain and diarrhea showing proctocolitis.  He is here today with american sign language interpreter for follow up care of proctocolitis.  Today he reports that he took the metronidazole for treatment of the proctocolitis and feels much better than he did.  However, now he is struggling with diarrhea and urgency.  He also describes pain in his rectum with bowel movements.  He denies blood with BM.  No nausea, vomiting, fevers, chills, night sweats, or weight loss.  He has mild abdominal cramping relieved by having a bowel movement.  Intermittent episodes of loose stool followed by straining and mild constipation.    CT scan 08/02/2016 IMPRESSION: 1. Borderline mild wall thickening in the distal sigmoid colon and rectum without significant surrounding fat stranding, which may indicate a mild nonspecific infectious or inflammatory proctocolitis. No significant colonic diverticulosis. No free air or abscess.  No Known Allergies Current Meds  Medication Sig  . darunavir-cobicistat (PREZCOBIX) 800-150 MG tablet Take 1 tablet by mouth daily.  Swallow whole. Do NOT crush, break or chew tablets. Take with food.  Marland Kitchen emtricitabine-tenofovir AF (DESCOVY) 200-25 MG tablet Take 1 tablet by mouth daily.  Marland Kitchen omeprazole (PRILOSEC) 20 MG capsule TAKE 1 CAPSULE(20 MG) BY MOUTH DAILY   Past Medical History:  Diagnosis Date  . CAP (community acquired pneumonia)   . Deaf   . GERD (gastroesophageal reflux disease)   . HIV disease ([place])   . Pulmonary nodule    History reviewed. No pertinent surgical history. Social History   Social History  . Marital status: Single   Social History Main Topics  . Smoking status: Never Smoker  . Smokeless tobacco: Never Used  . Alcohol use No  . Drug use: No  . Sexual activity: No     Comment: given condoms   Social History Narrative   Lives alone   Mom lives with his sister in [place]   No children   Works in Pensions consultant in [place]   Enjoys working on [employer]   No pets      family history includes Heart attack in his father.   Review of Systems GI: positive: pain with BM, lower abdominal pain.  All other systems negative See HPI for details.  Objective:   Physical Exam  @BP  116/64 (BP Location: Right Arm, Patient Position: Sitting, Cuff Size: Normal)   Pulse 87   Ht 5' 8"  (1.727 m)   Wt 218 lb (98.9 kg)   BMI 33.15 kg/m @  General:  Well-developed, well-nourished and in no acute distress Eyes:  anicteric. ENT:   Mouth and posterior pharynx free of lesions.  Neck:   supple  w/o thyromegaly or mass.  Lungs: Clear to auscultation bilaterally. Heart:   S1S2, no rubs, murmurs, gallops. Abdomen:  soft, non-tender, no hepatosplenomegaly, hernia, or mass and BS+.  Rectal: Normal anoderm, normal rectal tone.    Anoscopy - Dr. Carlean Purl Inflamed internal hemorrhoids and rectal mucosa withwhite exudate suspicious for proctitis  Lymph:  no cervical or supraclavicular adenopathy. Extremities:   no edema, cyanosis or clubbing Skin   no rash. Neuro:  A&O  x 3.  Psych:  appropriate mood and  Affect.   Data Reviewed: CT Scan 08/02/2016  Edward Qualia, PA-S [facility]

## 2016-10-12 NOTE — Patient Instructions (Signed)
You have been scheduled for a colonoscopy. Please follow written instructions given to you at your visit today.  Please pick up your prep supplies at the pharmacy within the next 1-3 days. If you use inhalers (even only as needed), please bring them with you on the day of your procedure. Your physician has requested that you go to www.startemmi.com and enter the access code given to you at your visit today. This web site gives a general overview about your procedure. However, you should still follow specific instructions given to you by our office regarding your preparation for the procedure.   I appreciate the opportunity to care for you. Silvano Rusk, MD, [facility]

## 2016-10-19 ENCOUNTER — Ambulatory Visit (INDEPENDENT_AMBULATORY_CARE_PROVIDER_SITE_OTHER): Payer: Medicare Other | Admitting: Internal Medicine

## 2016-10-19 VITALS — BP 108/70 | HR 72 | Ht 68.0 in | Wt 217.0 lb

## 2016-10-19 DIAGNOSIS — R0609 Other forms of dyspnea: Secondary | ICD-10-CM | POA: Diagnosis not present

## 2016-10-19 DIAGNOSIS — R0602 Shortness of breath: Secondary | ICD-10-CM | POA: Diagnosis not present

## 2016-10-19 DIAGNOSIS — R002 Palpitations: Secondary | ICD-10-CM | POA: Insufficient documentation

## 2016-10-19 LAB — BASIC METABOLIC PANEL
BUN/Creatinine Ratio: 9 (ref 9–20)
BUN: 9 mg/dL (ref 6–20)
CALCIUM: 8.9 mg/dL (ref 8.7–10.2)
CO2: 24 mmol/L (ref 20–29)
CREATININE: 0.96 mg/dL (ref 0.76–1.27)
Chloride: 99 mmol/L (ref 96–106)
GFR, EST AFRICAN AMERICAN: 117 mL/min/{1.73_m2} (ref >59)
GFR, EST NON AFRICAN AMERICAN: 101 mL/min/{1.73_m2} (ref >59)
Glucose: 107 mg/dL — ABNORMAL HIGH (ref 65–99)
POTASSIUM: 4 mmol/L (ref 3.5–5.2)
Sodium: 138 mmol/L (ref 134–144)

## 2016-10-19 LAB — TSH: TSH: 1.57 u[IU]/mL (ref 0.450–4.500)

## 2016-10-19 LAB — MAGNESIUM: Magnesium: 2 mg/dL (ref 1.6–2.3)

## 2016-10-19 NOTE — Progress Notes (Signed)
New Outpatient Visit Date: 10/19/2016  Referring Provider: [name], NP [address] [place],  [address]  Chief Complaint: Palpitations and shortness of breath  HPI:  Matthew Fitzpatrick is a 36 y.o. male who is being seen today for the evaluation of dyspnea on exertion and chest pain at the request of Ms. Inda Castle. He has a history of congenital deafness, dyslipidemia, and HIV. History is obtained with the assistance of a sign language interpreter. Matthew Fitzpatrick reports a 15 year history of episodic palpitations. He feels as though his heart is racing with accompanying shortness of breath. This can happen at any time and is without a clear precipitant. However, he has noted at least one episode where exercise seemed to bring on palpitations. The episodes occur about once every 3-4 months, most recently in this spring. Symptoms typically last for about an hour, though they have lasted the rest of the day in the past.  Matthew Fitzpatrick reports having undergone testing about 15 years ago when the symptoms first began, but believes that everything was normal. Echocardiogram performed in May showed hyperdynamic LV contraction, mild degenerative mitral valve disease, and trivial TR. He has not had any chest pain, lightheadedness, orthopnea, or PND. He denies shortness of breath with activity when he is not having palpitations. He rarely consumes caffeine.  --------------------------------------------------------------------------------------------------  Cardiovascular History & Procedures: Cardiovascular Problems:  Palpitations  Risk Factors:  Dyslipidemia, male gender, and HIV  Cath/PCI:  None  CV Surgery:  None  EP Procedures and Devices:  None  Non-Invasive Evaluation(s):  TTE (07/19/16): Normal LV size with vigorous contraction. LVEF 65-70% with normal wall motion. Normal diastolic function. Mitral annular calcification with mild leaflet thickening noted.  Normal RV size and function. Trivial tricuspid regurgitation.  Recent CV Pertinent Labs: Lab Results  Component Value Date   CHOL 218 (H) 03/02/2016   HDL 45 03/02/2016   LDLCALC 145 (H) 03/02/2016   TRIG 141 03/02/2016   CHOLHDL 4.8 03/02/2016   K 3.6 09/21/2016   MG 2.1 02/05/2013   BUN 10 09/21/2016   CREATININE 0.97 09/21/2016   --------------------------------------------------------------------------------------------------  Past Medical History:  Diagnosis Date  . Acute conjunctivitis 02/12/2014  . Acute gastroenteritis 08/12/2015  . ANXIETY 03/02/2006   Qualifier: Diagnosis of  By: [name] MD, [name]    . Bronchitis 01/26/2011  . CAP (community acquired pneumonia)   . CHICKENPOX, HX OF 06/20/2006   Annotation: age 33 Qualifier: Diagnosis of  By: [name] MD, Jenny Reichmann    . COUGH, CHRONIC 12/23/2008   Qualifier: Diagnosis of  By: [name] MD, [name]    . Deaf   . 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 DEPRESSION 03/02/2006   Qualifier: Diagnosis of  By: [name] MD, [name]    . Dyslipidemia 07/02/2012  . 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 FACIAL RASH 11/17/2008   Qualifier: Diagnosis of  By: [name] MD, [name]    . GERD 07/20/2009   Qualifier: Diagnosis of  By: [name] MD, [name]    . GERD (gastroesophageal reflux disease)   . HIV disease ([place])   . Obesity (BMI 30.0-34.9) 04/20/2016  . PEDICULOSIS PUBIS ([place]) 06/20/2006   Annotation: 4/06 Qualifier: Diagnosis of  By: [name] MD, [name]    . Poor dentition 09/21/2016  . 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 Pulmonary nodule   . SINUSITIS, CHRONIC 02/18/2009   Qualifier: Diagnosis of  By: Tommy Medal MD, Roderic Scarce    . Urinary frequency 04/06/2009   Qualifier: Diagnosis of  By: [name] MD, [name]      History reviewed. No pertinent surgical  history.  Current Meds  Medication Sig  . darunavir-cobicistat (PREZCOBIX) 800-150 MG tablet Take 1 tablet by mouth daily. Swallow whole. Do NOT crush, break or chew tablets. Take with food.  Marland Kitchen emtricitabine-tenofovir AF (DESCOVY) 200-25 MG tablet Take 1 tablet by mouth daily.  Marland Kitchen omeprazole (PRILOSEC) 20 MG capsule TAKE 1 CAPSULE(20 MG) BY MOUTH DAILY    Allergies: Patient has no known allergies.  Social History   Social History  . Marital status: Single    Spouse name: N/A  . Number of children: N/A  . Years of education: N/A   Occupational History  . Not on file.   Social History Main Topics  . Smoking status: Never Smoker  . Smokeless tobacco: Never Used  . Alcohol use No  . Drug use: No  . Sexual activity: No     Comment: given condoms   Other Topics Concern  . 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 Headache Mother   . Hypertension Mother   . Heart attack Father 39  . Sudden Cardiac Death Neg Hx     Review of Systems: A 12-system review of systems was performed and was negative except as noted in the HPI.  --------------------------------------------------------------------------------------------------  Physical Exam: BP 108/70   Pulse 72   Ht 5' 8"  (1.727 m)   Wt 217 lb (98.4 kg)   SpO2 98%   BMI 32.99 kg/m   General:  Obese man, seated comfortably in the exam room. HEENT: No conjunctival pallor or scleral icterus. Moist mucous membranes. OP clear. Neck: Supple without lymphadenopathy, thyromegaly, JVD, or HJR. No carotid bruit. Lungs: Normal work of breathing. Clear to auscultation bilaterally without wheezes or crackles. Heart: Bradycardic but regular without murmurs, rubs, or gallops. Non-displaced PMI. Abd:  Bowel sounds present. Soft, NT/ND without hepatosplenomegaly Ext: No lower extremity edema. Radial, PT, and DP pulses are 2+ bilaterally Skin: Warm and dry without rash. Neuro: CNIII-XII intact. Strength and fine-touch sensation intact in upper and lower extremities bilaterally. Psych: Normal mood and affect.  EKG:  Sinus bradycardia (ventricular rate 58 bpm) with incomplete right bundle branch block and nonspecific T-wave changes.  Lab Results  Component Value Date   WBC 6.0 09/21/2016   HGB 14.5 09/21/2016   HCT 42.9 09/21/2016   MCV 94.1 09/21/2016   PLT 256 09/21/2016    Lab Results  Component Value Date   NA 137 09/21/2016   K 3.6 09/21/2016   CL 101 09/21/2016   CO2 24 09/21/2016   BUN 10 09/21/2016   CREATININE 0.97 09/21/2016   GLUCOSE 104 (H) 09/21/2016   ALT 19 09/21/2016    Lab Results  Component Value Date   CHOL 218 (H) 03/02/2016   HDL 45 03/02/2016   LDLCALC 145 (H) 03/02/2016   TRIG 141 03/02/2016   CHOLHDL 4.8 03/02/2016    --------------------------------------------------------------------------------------------------  ASSESSMENT  AND PLAN: Palpitations and shortness of breath Symptoms seem to be related, occurring about once every 3-4 months. Matthew Fitzpatrick has not had any significant chest pain. I suspect his palpitations and shortness of breath are most likely due to transient arrhythmia, most likely supraventricular. Labs are notable for mild hypokalemia in the past. Echo did not show any significant structural abnormalities. Given that at least one episode has occurred with activity, I think it would be reasonable to obtain an exercise tolerance test. We will also repeat a basic metabolic panel and check a magnesium and TSH today. Given that these episodes happen infrequently, we have agreed to defer event monitor placement. If he experiences a sustained episode of palpitations, I asked him to to call us to arrange for urgent EKG in an attempt to  capture the arrhythmia.  Follow-up: Return to clinic in 3 months.  Nelva Bush, MD 10/19/2016 8:54 AM

## 2016-10-19 NOTE — Patient Instructions (Signed)
Medication Instructions:  Your physician recommends that you continue on your current medications as directed. Please refer to the Current Medication list given to you today.   Labwork: BMET/Magnesium Level/ TSH today  Testing/Procedures: Your physician has requested that you have an exercise tolerance test. For further information please visit [email or web address]. Please also follow instruction sheet, as given.     Follow-Up: Your physician recommends that you schedule a follow-up appointment in: 3 months with Dr End.       If you need a refill on your cardiac medications before your next appointment, please call your pharmacy.

## 2016-10-25 ENCOUNTER — Telehealth: Payer: Self-pay | Admitting: *Deleted

## 2016-10-25 ENCOUNTER — Other Ambulatory Visit: Payer: Self-pay | Admitting: Internal Medicine

## 2016-10-25 DIAGNOSIS — K219 Gastro-esophageal reflux disease without esophagitis: Secondary | ICD-10-CM

## 2016-10-25 DIAGNOSIS — B20 Human immunodeficiency virus [HIV] disease: Secondary | ICD-10-CM

## 2016-10-25 NOTE — Telephone Encounter (Signed)
Patient called to advise that he has questions about his SPAP. Advised him looks like he still has missing paperwork and we can do nothing until we get that. He asked that Juliann Pulse call him to discuss as he is confused and thought this was done. Advised she is with patients but will leave her a message to call him when she can.

## 2016-11-02 ENCOUNTER — Ambulatory Visit (INDEPENDENT_AMBULATORY_CARE_PROVIDER_SITE_OTHER): Payer: Medicare Other

## 2016-11-02 DIAGNOSIS — R0602 Shortness of breath: Secondary | ICD-10-CM | POA: Diagnosis not present

## 2016-11-02 DIAGNOSIS — R002 Palpitations: Secondary | ICD-10-CM

## 2016-11-02 LAB — EXERCISE TOLERANCE TEST
CHL RATE OF PERCEIVED EXERTION: 16
CSEPEDS: 27 s
Estimated workload: 9.2 METS
Exercise duration (min): 7 min
MPHR: 183 {beats}/min
Peak HR: 142 {beats}/min
Percent HR: 16 %
Rest HR: 60 {beats}/min

## 2016-11-10 ENCOUNTER — Encounter: Payer: Medicare Other | Admitting: Internal Medicine

## 2016-11-17 ENCOUNTER — Encounter: Payer: Self-pay | Admitting: Internal Medicine

## 2016-12-22 ENCOUNTER — Encounter (HOSPITAL_COMMUNITY): Payer: Self-pay | Admitting: *Deleted

## 2016-12-22 ENCOUNTER — Emergency Department (HOSPITAL_COMMUNITY): Payer: Medicare Other

## 2016-12-22 ENCOUNTER — Emergency Department (HOSPITAL_COMMUNITY)
Admission: EM | Admit: 2016-12-22 | Discharge: 2016-12-22 | Disposition: A | Discharge status: Home / Self Care | Payer: Medicare Other | Attending: Emergency Medicine | Admitting: Emergency Medicine

## 2016-12-22 DIAGNOSIS — Y929 Unspecified place or not applicable: Secondary | ICD-10-CM | POA: Diagnosis not present

## 2016-12-22 DIAGNOSIS — G8929 Other chronic pain: Secondary | ICD-10-CM

## 2016-12-22 DIAGNOSIS — Y33XXXA Other specified events, undetermined intent, initial encounter: Secondary | ICD-10-CM | POA: Insufficient documentation

## 2016-12-22 DIAGNOSIS — Z79899 Other long term (current) drug therapy: Secondary | ICD-10-CM | POA: Insufficient documentation

## 2016-12-22 DIAGNOSIS — Y999 Unspecified external cause status: Secondary | ICD-10-CM | POA: Insufficient documentation

## 2016-12-22 DIAGNOSIS — M25572 Pain in left ankle and joints of left foot: Secondary | ICD-10-CM | POA: Diagnosis not present

## 2016-12-22 DIAGNOSIS — M25472 Effusion, left ankle: Secondary | ICD-10-CM | POA: Diagnosis not present

## 2016-12-22 DIAGNOSIS — M7989 Other specified soft tissue disorders: Secondary | ICD-10-CM | POA: Diagnosis not present

## 2016-12-22 DIAGNOSIS — S99912A Unspecified injury of left ankle, initial encounter: Secondary | ICD-10-CM | POA: Diagnosis present

## 2016-12-22 DIAGNOSIS — Y939 Activity, unspecified: Secondary | ICD-10-CM | POA: Insufficient documentation

## 2016-12-22 DIAGNOSIS — S93402A Sprain of unspecified ligament of left ankle, initial encounter: Secondary | ICD-10-CM | POA: Diagnosis not present

## 2016-12-22 MED ORDER — ACETAMINOPHEN 325 MG PO TABS
650.0000 mg | ORAL_TABLET | Freq: Once | ORAL | Status: AC
  Administered 2016-12-22: 650 mg via ORAL
  Filled 2016-12-22: qty 2

## 2016-12-22 NOTE — ED Notes (Signed)
Patient transported to X-ray 

## 2016-12-22 NOTE — ED Provider Notes (Signed)
[facility]ER[facility] DEPARTMENT Provider Note   CSN: [id number] Arrival date & time: 12/22/16  1522     History   Chief Complaint No chief complaint on file.   HPI Matthew Fitzpatrick is a 36 y.o. male with past history of HIV who presents with left ankle pain has been ongoing for the last several months. Patient reports that initially his pain began in the spring of 2018. He reports that pain is been intermittent since then. He reports that over the last 2-3 days, pain has become more persistent and worse. Patient reports that he has been able ambulate over the last few months. Over the last 2 days, he has had a more difficult in delay secondary to his pain. Patient reports this pain is worsened when attempting to ambulate. He denies any preceding trauma, injury, fall and spring or the last few days when symptoms worsened. He reports that most of his pain is located in the medial malleolus. He has also noticed some ankle swelling. He denies any warmth or redness to the ankle. Patient reports that he has been taking Advil with minimal improvement in symptoms. Patient denies any fevers, history of gout, numbness/weakness.  The history is provided by the patient. A language interpreter was used.    Past Medical History:  Diagnosis Date  . Acute conjunctivitis 02/12/2014  . Acute gastroenteritis 08/12/2015  . ANXIETY 03/02/2006   Qualifier: Diagnosis of  By: [name] MD, [name]    . Bronchitis 01/26/2011  . CAP (community acquired pneumonia)   . CHICKENPOX, HX OF 06/20/2006   Annotation: age 12 Qualifier: Diagnosis of  By: [name] MD, Jenny Reichmann    . COUGH, CHRONIC 12/23/2008   Qualifier: Diagnosis of  By: [name] MD, [name]    . Deaf   . 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 DEPRESSION 03/02/2006   Qualifier: Diagnosis of  By: [name] MD, [name]    . Dyslipidemia 07/02/2012  . 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 FACIAL RASH 11/17/2008   Qualifier: Diagnosis of  By: [name] MD, [name]    . GERD 07/20/2009   Qualifier: Diagnosis of  By: [name] MD, [name]    . GERD (gastroesophageal reflux disease)   . HIV disease ([place])   . Obesity (BMI 30.0-34.9) 04/20/2016  . PEDICULOSIS PUBIS (PUBIC LOUSE) 06/20/2006   Annotation: 4/06 Qualifier: Diagnosis of  By: [name] MD, [name]    . Poor dentition 09/21/2016  . 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 Pulmonary nodule   . SINUSITIS, CHRONIC 02/18/2009   Qualifier: Diagnosis of  By: Tommy Medal MD, Roderic Scarce    . Urinary frequency 04/06/2009   Qualifier: Diagnosis of  By: [name] MD, [name]      Patient Active Problem List   Diagnosis Date Noted  . Palpitations 10/19/2016  . Dyspnea on exertion 10/19/2016  . Poor dentition 09/21/2016  . Pulmonary nodule 08/17/2016  . Obesity (BMI 30.0-34.9) 04/20/2016  . Acute conjunctivitis 02/12/2014  . Dyslipidemia 07/02/2012  . GERD (gastroesophageal reflux disease) 06/28/2010  . 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 Human immunodeficiency virus (HIV) disease ([place]) 03/02/2006  . ANXIETY 03/02/2006  . DEPRESSION 03/02/2006  . ALLERGIC RHINITIS 03/02/2006    History reviewed. No pertinent surgical history.  Home Medications    Prior to Admission medications   Medication Sig Start Date End Date Taking? Authorizing Provider  DESCOVY 200-25 MG tablet TAKE 1 TABLET BY MOUTH DAILY 10/25/16   Michel Bickers, MD  omeprazole (PRILOSEC) 20 MG capsule TAKE 1 CAPSULE(20 MG) BY MOUTH DAILY 10/25/16   Michel Bickers, MD  PREZCOBIX 800-150 MG tablet TAKE 1 TABLET BY MOUTH DAILY. SWALLOW WHOLE. DO NOT CRUSH, BREAK OR CHEW TABLETS.TK WITH FOOD 10/25/16   Michel Bickers, MD    Family History Family History  Problem Relation Age of Onset  . Headache Mother   .  Hypertension Mother   . Heart attack Father 86  . Sudden Cardiac Death Neg Hx     Social History Social History  Substance Use Topics  . Smoking status: Never Smoker  . Smokeless tobacco: Never Used  . Alcohol use No     Allergies   Patient has no known allergies.   Review of Systems Review of Systems  Constitutional: Negative for fever.  Musculoskeletal: Positive for joint swelling.       Left ankle pain  Skin: Negative for color change.  Neurological: Negative for weakness and numbness.     Physical Exam Updated Vital Signs BP (!) 149/96 (BP Location: Left Arm)   Pulse 90   Temp 97.6 F (36.4 C) (Oral)   Resp 16   Ht 5' 8"  (1.727 m)   Wt 93 kg (205 lb)   SpO2 98%   BMI 31.17 kg/m   Physical Exam  Constitutional: He appears well-developed and well-nourished.  HENT:  Head: Normocephalic and atraumatic.  Eyes: Conjunctivae and EOM are normal. Right eye exhibits no discharge. Left eye exhibits no discharge. No scleral icterus.  Cardiovascular:  Pulses:      Dorsalis pedis pulses are 2+ on the right side, and 2+ on the left side.  Pulmonary/Chest: Effort normal.  Musculoskeletal:  Tenderness palpation to the medial and lateral malleolus and the left ankle. Mild overlying soft tissue swelling. No overlying warmth, erythema. No ecchymosis. Left ankle appears more everted than right. No bony deformity or crepitus noted.Dorsiflexion and plantar flexion of left ankle intact but with subjective reports of pain. He is able to move all 5 digits of the left foot witty. No abnormalities of the right ankle. No tenderness palpation to the distal tib-fib of the left lower extremity. No edema noted to the proximal BLE.   Neurological: He is alert.  Sensation intact along major nerve distributions of bilateral feet  Skin: Skin is warm and dry. Capillary refill takes less than 2 seconds.  Bilateral lower extremities are very dry with scabbed over skin. No evidence of open wounds,  ulcers, sores. Good distal cap refill. Distal left lower extremity is not dusky in appearance or cool to touch. No rash noted to the soles of the feet.   Psychiatric: He has a normal mood and affect. His speech is normal and behavior is normal.  Nursing note and vitals reviewed.    ED Treatments / Results  Labs (all labs ordered are listed, but only abnormal results are displayed) Labs Reviewed - No data to display  EKG  EKG Interpretation None       Radiology Dg Ankle Complete Left  Result Date: 12/22/2016 CLINICAL DATA:  Bilateral feet pain worse on the left-side over the past 6 months. Pain worse this week with tenderness and pain walking. EXAM: LEFT ANKLE COMPLETE - 3+ VIEW COMPARISON:  None. FINDINGS: Diffuse soft tissue swelling  over the ankle. No evidence of fracture or dislocation. Ankle mortise is normal. IMPRESSION: No acute findings. Diffuse soft tissue swelling over the ankle. Electronically Signed   By: Marin Olp M.D.   On: 12/22/2016 17:49    Procedures Procedures (including critical care time)  Medications Ordered in ED Medications  acetaminophen (TYLENOL) tablet 650 mg (650 mg Oral Given 12/22/16 1759)     Initial Impression / Assessment and Plan / ED Course  I have reviewed the triage vital signs and the nursing notes.  Pertinent labs & imaging results that were available during my care of the patient were reviewed by me and considered in my medical decision making (see chart for details).     36 year old male with past medical history of HIV who presents with chronic left ankle pain. Initially began Spring 2018 and is worsened in last several days. There is mention in the triage note patient having bilateral ankle pain. During my exam and interview, he denies any right ankle pain. Difficulty ambulating secondary to pain. Patient is afebrile, non-toxic appearing, sitting comfortably on examination table.. Vital signs reviewed and stable. Patient is  neurovascularly intact. Consider ankle sprain, particularly given slight eversion of the ankle, vs tendinitis versus OA vs chronic ankle pain. History/physical exam are not concerning for plantar fasciitis, acute arterial embolism gout, DVT or septic arthritis. Plan to obtain XR imaging for further evaluation. Analgesics provided in the department.  X-rays reviewed. Negative for any acute fracture dislocation. I personally reviewed the x-rays and do not see any abnormality that correlates the patient's area of pain. Discussed results with patient using sign language. Will plan to provide splint and crutches in the department for support and stabilization. Encourage patient to stay off of the foot the next week. We'll provide outpatient podiatry referral for patient follow-up with. Conservative at home therapies discussed with patient using them and instructed her. Strict return precautions discussed. Patient expresses understanding and agreement to plan.   Final Clinical Impressions(s) / ED Diagnoses   Final diagnoses:  Chronic pain of left ankle  Left ankle swelling  Sprain of left ankle, unspecified ligament, initial encounter    New Prescriptions New Prescriptions   No medications on file     Desma Mcgregor 12/22/16 1837    Forde Dandy, MD 12/23/16 [phone]

## 2016-12-22 NOTE — Discharge Instructions (Signed)
Follow up with your Primary Care Doctor as needed.    Follow up with referred foot doctor if no improvement of pain.   You can take tylenol or ibuprofen as needed for pain. You can alternate Tylenol and Ibuprofen every 4 hours for additional pain relief.    Return to the Emergency Department immediately for any worsening pain, redness/swelling of the ankle, gray or blue color to the toes, numbness/weakness of toes or foot, difficulty walking or any other worsening or concerning symptoms.    Ankle sprain Ankle sprain occurs when the ligaments that hold the ankle joint to get her are stretched or torn. It may take 4-6 weeks to heal.  For activity: Use crutches with nonweightbearing for the first few days. Then, you may walk on your ankles as the pain allows, or as instructed. Start gradually with weight bearing on the affected ankle. Once you can walk pain free, then try jogging. When you can run forwards, then you can try moving side to side. If you cannot walk without crutches in one week, you need a recheck by your Family Doctor.  If you do not have a family doctor to followup with, you can see the list of phone numbers below. Please call today to make a followup appointment.   RICE therapy:  Routine Care for injuries  Rest, Ice, Compression, Elevation (RICE)  Rest is needed to allow your body to heal. Routine activities can be resumed when comfortable. Injury tendons and bones can take up to 6 weeks to heal. Tendons are cordlike structures that attach muscles and bones.  Ice following an injury helps keep the swelling down and reduce the pain. Put ice in a plastic bag. Place a towel between your skin and the bag of ice. Leave the ice on for 15-20 minutes, 3-4 times a day. Do this while awake, for the first 24-48 hours. After that continue as directed by your caregiver.  Compression helps keep swelling down. It also gives support and helps with discomfort. If any lasting bandage has been  applied, it should be removed and reapplied every 3-4 hours. It should not be applied tightly, but firmly enough to keep swelling down. Watch fingers or toes for swelling, discoloration, coldness, numbness or excessive pain. If any of these problems occur, removed the bandage and reapply loosely. Contact your caregiver if these problems continue.  Elevation helps reduce swelling and decrease your pain. With extremities such as the arms, hands, legs and feet, the injured area should be placed near or above the level of the heart if possible.

## 2016-12-22 NOTE — ED Notes (Signed)
ED Provider at bedside. 

## 2016-12-22 NOTE — ED Notes (Signed)
SL interpretor at bedside

## 2016-12-22 NOTE — ED Triage Notes (Addendum)
Pt c/o bi feet pain worse on the L side onset Spring 2018, pt reports worsening pain this week with working long hours on feet, pt reports feet are sensitive to touch, pt reports difficulty walking because of pain, A&O x4, interpretor used for ASL, pt has swelling to bil ankles with L ankle worse

## 2016-12-26 ENCOUNTER — Encounter: Payer: Self-pay | Admitting: Podiatry

## 2016-12-26 ENCOUNTER — Ambulatory Visit (INDEPENDENT_AMBULATORY_CARE_PROVIDER_SITE_OTHER): Payer: Medicare Other | Admitting: Podiatry

## 2016-12-26 ENCOUNTER — Ambulatory Visit: Payer: Medicare Other

## 2016-12-26 VITALS — BP 119/74 | HR 73 | Resp 16

## 2016-12-26 DIAGNOSIS — M76822 Posterior tibial tendinitis, left leg: Secondary | ICD-10-CM

## 2016-12-26 DIAGNOSIS — M7752 Other enthesopathy of left foot: Secondary | ICD-10-CM | POA: Diagnosis not present

## 2016-12-26 MED ORDER — MELOXICAM 15 MG PO TABS: 15.0000 mg | ORAL_TABLET | Freq: Every day | ORAL | 3 refills | Status: DC

## 2016-12-26 MED ORDER — METHYLPREDNISOLONE 4 MG PO TBPK: ORAL_TABLET | ORAL | 0 refills | Status: DC

## 2016-12-26 NOTE — Progress Notes (Signed)
Subjective:  Patient ID: Matthew Fitzpatrick, male    DOB: [birth year],  MRN: [id number] HPI Chief Complaint  Patient presents with  . Foot Pain    Medial foot and ankle left - last Thursday woke up and foot was in severe pain and swollen, no injury, couldn't put much weight on foot, went to ER on Friday-xrayed-said no fracture, gave brace (no help), took Advil, feels like walking on ankle, stands at work through whole shift    36 y.o. male presents with the above complaint.     Past Medical History:  Diagnosis Date  . Acute conjunctivitis 02/12/2014  . Acute gastroenteritis 08/12/2015  . ANXIETY 03/02/2006   Qualifier: Diagnosis of  By: [name] MD, [name]    . Bronchitis 01/26/2011  . CAP (community acquired pneumonia)   . CHICKENPOX, HX OF 06/20/2006   Annotation: age 36 Qualifier: Diagnosis of  By: [name] MD, Jenny Reichmann    . COUGH, CHRONIC 12/23/2008   Qualifier: Diagnosis of  By: [name] MD, [name]    . Deaf   . 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 DEPRESSION 03/02/2006   Qualifier: Diagnosis of  By: [name] MD, [name]    . Dyslipidemia 07/02/2012  . 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 FACIAL RASH 11/17/2008   Qualifier: Diagnosis of  By: [name] MD, [name]    . GERD 07/20/2009   Qualifier: Diagnosis of  By: [name] MD, [name]    . GERD (gastroesophageal reflux disease)   . HIV disease ([place])   . Obesity (BMI 30.0-34.9) 04/20/2016  . PEDICULOSIS PUBIS (PUBIC LOUSE) 06/20/2006   Annotation: 4/06 Qualifier: Diagnosis of  By: [name] MD, [name]    . Poor dentition 09/21/2016  . 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 Pulmonary nodule   . SINUSITIS, CHRONIC 02/18/2009   Qualifier: Diagnosis of  By: Tommy Medal MD, Roderic Scarce    . Urinary frequency 04/06/2009   Qualifier: Diagnosis of  By: [name] MD, [name]     No past  surgical history on file.  Current Outpatient Prescriptions:  .  DESCOVY 200-25 MG tablet, TAKE 1 TABLET BY MOUTH DAILY, Disp: 30 tablet, Rfl: 5 .  omeprazole (PRILOSEC) 20 MG capsule, TAKE 1 CAPSULE(20 MG) BY MOUTH DAILY, Disp: 30 capsule, Rfl: 5 .  PREZCOBIX 800-150 MG tablet, TAKE 1 TABLET BY MOUTH DAILY. SWALLOW WHOLE. DO NOT CRUSH, BREAK OR CHEW TABLETS.TK WITH FOOD, Disp: 30 tablet, Rfl: 5  No Known Allergies Review of Systems  Musculoskeletal: Positive for gait problem.  All other systems reviewed and are negative.  Objective:   Vitals:   12/26/16 1522  BP: 119/74  Pulse: 73  Resp: 16    General: Well developed, nourished, in no acute distress, alert and oriented x3   Dermatological: Skin is warm, dry and supple bilateral. Nails x 10 are well maintained; remaining integument appears unremarkable at this time. There are no open sores, no preulcerative lesions, no rash or signs of infection present.  Vascular: Dorsalis Pedis artery and Posterior Tibial artery pedal pulses are 2/4 bilateral with immedate capillary fill time. Pedal hair growth present. No varicosities and no lower extremity edema present bilateral.   Neruologic: Grossly intact via light touch bilateral. Vibratory intact via tuning fork bilateral. Protective threshold with Semmes Wienstein monofilament intact to  all pedal sites bilateral. Patellar and Achilles deep tendon reflexes 2+ bilateral. No Babinski or clonus noted bilateral.   Musculoskeletal: No gross boney pedal deformities bilateral. No pain, crepitus, or limitation noted with foot and ankle range of motion bilateral. Muscular strength 5/5 in all groups tested bilateral.He has pain on palpation of the posterior tibial tendon with pes planus. There appears to be fluctuance within the tendon sheath. Flexible pes planus is noted.  Gait: Unassisted, Nonantalgic.    Radiographs:  Radiographs taken today demonstrate only pes planus no major osseous  abnormalities.  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    Plan: Injected the area today with Kenalog and local anesthetic. Placed him in a Cam Walker with a toe protector. Also provided him with 2 medications Medrol Dosepak to be followed by meloxicam. I will follow-up with him in 1 month. If this does not resolve essentially within 1 month we will get an MRI of his left foot.     Elissa Grieshop T. [place], [place]

## 2017-01-21 NOTE — Progress Notes (Signed)
Follow-up Outpatient Visit Date: 01/22/2017  Primary Care Provider: [name], NP [address] [place] [id number]  Chief Complaint: Shortness of breath  HPI:  Mr. Matthew Fitzpatrick is a 36 y.o. year-old male with history of dyslipidemia, HIV, and congenital deafness, who presents for follow-up of palpitations and shortness of breath.  He noted a 15-year history of episodic palpitations with accompanying shortness of breath.  We agreed to obtain an exercise tolerance test, given that his palpitations have sometimes been related to exertion.  Event monitor was deferred, given that the episodes only occur once every 3-4 months.  ETT showed no evidence of ischemia or arrhythmias, though target heart rate was not achieved.  Today, Mr. Matthew Fitzpatrick reports feeling about the same or slightly better than at our last visit.  He still has some exertional dyspnea as well as orthopnea and PND.  He also notes rare palpitations that most often occur when he wakes up in the morning.  Frequency is still every 3-4 months and is associated only with a "weirdness" in his chest.  He has noted mild chest discomfort when he takes a deep breath.  He does not have any exertional chest pain.  Mr. Matthew Fitzpatrick denies edema other than some recent swelling around the medial malleolus of the left leg for which he was seen in the ED.  He was prescribed meloxicam and methylprednisolone, which he continues.  He is due for reevaluation tomorrow.  Mr. Matthew Fitzpatrick attributes some of his symptoms to poor diet.  He has been eating fast food most days.  He has gained some weight.  Blood pressure is also elevated today.  --------------------------------------------------------------------------------------------------  Cardiovascular History & Procedures: Cardiovascular Problems:  Dyspnea on exertion  Palpitations  Risk Factors:  Dyslipidemia, male gender, and HIV  Cath/PCI:  None  CV  Surgery:  None  EP Procedures and Devices:  None  Non-Invasive Evaluation(s):  Exercise tolerance test (11/02/16): Fair exercise capacity (7 min, 27 sec), though patient did not achieve target heart rate.  No arrhythmias or ischemic EKG changes at heart rate achieved.  TTE (07/19/16): Normal LV size with vigorous contraction. LVEF 65-70% with normal wall motion. Normal diastolic function. Mitral annular calcification with mild leaflet thickening noted. Normal RV size and function. Trivial tricuspid regurgitation.  Recent CV Pertinent Labs: Lab Results  Component Value Date   CHOL 218 (H) 03/02/2016   HDL 45 03/02/2016   LDLCALC 145 (H) 03/02/2016   TRIG 141 03/02/2016   CHOLHDL 4.8 03/02/2016   K 4.0 10/19/2016   MG 2.0 10/19/2016   BUN 9 10/19/2016   CREATININE 0.96 10/19/2016   CREATININE 0.97 09/21/2016    Past medical and surgical history were reviewed and updated in EPIC.  Current Meds  Medication Sig  . DESCOVY 200-25 MG tablet TAKE 1 TABLET BY MOUTH DAILY  . meloxicam (MOBIC) 15 MG tablet Take 1 tablet (15 mg total) by mouth daily.  . methylPREDNISolone (MEDROL DOSEPAK) 4 MG TBPK tablet 6 day dose pack - take as directed  . omeprazole (PRILOSEC) 20 MG capsule TAKE 1 CAPSULE(20 MG) BY MOUTH DAILY  . PREZCOBIX 800-150 MG tablet TAKE 1 TABLET BY MOUTH DAILY. SWALLOW WHOLE. DO NOT CRUSH, BREAK OR CHEW TABLETS.TK WITH FOOD    Allergies: Patient has no known allergies.  Social History   Socioeconomic History  . Marital status: Single    Spouse name: Not on file  . Number of children: Not on file  . Years of education:  Not on file  . Highest education level: Not on file  Social Needs  . Financial resource strain: Not on file  . Food insecurity - worry: Not on file  . Food insecurity - inability: Not on file  . Transportation needs - medical: Not on file  . Transportation needs - non-medical: Not on file  Occupational History  . Not on file  Tobacco Use  .  Smoking status: Never Smoker  . Smokeless tobacco: Never Used  Substance and Sexual Activity  . Alcohol use: No    Alcohol/week: 0.0 oz  . Drug use: No  . Sexual activity: No    Comment: given condoms  Other Topics Concern  . 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 Headache Mother   . Hypertension Mother   . Heart attack Father 47  . Sudden Cardiac Death Neg Hx     Review of Systems: A 12-system review of systems was performed and was negative except as noted in the HPI.  --------------------------------------------------------------------------------------------------  Physical Exam: BP (!) 144/90   Pulse 77   Ht 5' 8"  (1.727 m)   Wt 232 lb (105.2 kg)   SpO2 97%   BMI 35.28 kg/m   General: Obese man, seated comfortably in the exam room.  He is accompanied by a sign language interpreter. HEENT: No conjunctival pallor or scleral icterus. Moist mucous membranes.  OP clear. Neck: Supple without lymphadenopathy, thyromegaly, JVD, or HJR. No carotid bruit. Lungs: Normal work of breathing. Clear to auscultation bilaterally without wheezes or crackles. Heart: Regular rate and rhythm without murmurs, rubs, or gallops. Non-displaced PMI. Abd: Bowel sounds present. Soft, NT/ND without hepatosplenomegaly Ext: No lower extremity edema. Radial, PT, and DP pulses are 2+ bilaterally. Skin: Warm and dry without rash.  Lab Results  Component Value Date   WBC 6.0 09/21/2016   HGB 14.5 09/21/2016   HCT 42.9 09/21/2016   MCV 94.1 09/21/2016   PLT 256 09/21/2016    Lab Results  Component Value Date   NA 138 10/19/2016   K 4.0 10/19/2016   CL 99 10/19/2016   CO2 24 10/19/2016   BUN 9 10/19/2016   CREATININE 0.96 10/19/2016   GLUCOSE 107 (H)  10/19/2016   ALT 19 09/21/2016    Lab Results  Component Value Date   CHOL 218 (H) 03/02/2016   HDL 45 03/02/2016   LDLCALC 145 (H) 03/02/2016   TRIG 141 03/02/2016   CHOLHDL 4.8 03/02/2016    --------------------------------------------------------------------------------------------------  ASSESSMENT AND PLAN: Dyspnea on exertion Symptoms are chronic and slightly better than at our last visit.  Mr. Matthew Fitzpatrick also reports some orthopnea and PND, which are stable and long-standing.  Prior echo showed gross LV contraction without diastolic dysfunction.  No significant valvular abnormalities were seen.  He has gained some weight from our prior visit, which may represent some fluid retention in the setting of increased salt intake and ongoing steroid/NSAID use.  We have discussed further evaluation and treatment options, including weaning from steroids and reduce salt intake as well as coronary CTA.  Mr. Matthew Fitzpatrick would like to defer the CTA for the time being and focus on lifestyle modifications.  I have asked him to contact  us if his symptoms worsen in the meantime.  Otherwise, we will see him back in 3 months to reassess his symptoms.  If symptoms persist despite aforementioned interventions /testing, referral for sleep study and/or pulmonary evaluation will be considered.  Elevated blood pressure Blood pressure is mildly elevated today, though most prior readings have been normal.  I have encouraged Mr. Matthew Fitzpatrick to limit his sodium intake and have provided him with information about the DASH diet.  If his blood pressure remains elevated at follow-up, we will need to consider adding medication for control of his blood pressure.  Palpitations These remain sporadic, occurring only every 3-4 months.  No change from our last visit.  We will defer event monitor placement, given the infrequent nature of his symptoms.  Follow-up: Return to clinic in 3 months.  Nelva Bush, MD 01/22/2017 9:48  AM

## 2017-01-22 ENCOUNTER — Ambulatory Visit (INDEPENDENT_AMBULATORY_CARE_PROVIDER_SITE_OTHER): Payer: Medicare Other | Admitting: Internal Medicine

## 2017-01-22 ENCOUNTER — Encounter: Payer: Self-pay | Admitting: Internal Medicine

## 2017-01-22 VITALS — BP 144/90 | HR 77 | Ht 68.0 in | Wt 232.0 lb

## 2017-01-22 DIAGNOSIS — R002 Palpitations: Secondary | ICD-10-CM | POA: Diagnosis not present

## 2017-01-22 DIAGNOSIS — R030 Elevated blood-pressure reading, without diagnosis of hypertension: Secondary | ICD-10-CM | POA: Diagnosis not present

## 2017-01-22 DIAGNOSIS — R0609 Other forms of dyspnea: Secondary | ICD-10-CM

## 2017-01-22 NOTE — Patient Instructions (Signed)
Medication Instructions:  Your physician recommends that you continue on your current medications as directed. Please refer to the Current Medication list given to you today.   Labwork: none  Testing/Procedures: None   Follow-Up: Your physician recommends that you schedule a follow-up appointment in: 3 months with Dr End.       DASH Eating Plan DASH stands for "Dietary Approaches to Stop Hypertension." The DASH eating plan is a healthy eating plan that has been shown to reduce high blood pressure (hypertension). It may also reduce your risk for type 2 diabetes, heart disease, and stroke. The DASH eating plan may also help with weight loss. What are tips for following this plan? General guidelines  Avoid eating more than 2,300 mg (milligrams) of salt (sodium) a day. If you have hypertension, you may need to reduce your sodium intake to 1,500 mg a day.  Limit alcohol intake to no more than 1 drink a day for nonpregnant women and 2 drinks a day for men. One drink equals 12 oz of beer, 5 oz of wine, or 1 oz of hard liquor.  Work with your health care provider to maintain a healthy body weight or to lose weight. Ask what an ideal weight is for you.  Get at least 30 minutes of exercise that causes your heart to beat faster (aerobic exercise) most days of the week. Activities may include walking, swimming, or biking.  Work with your health care provider or diet and nutrition specialist (dietitian) to adjust your eating plan to your individual calorie needs. Reading food labels  Check food labels for the amount of sodium per serving. Choose foods with less than 5 percent of the Daily Value of sodium. Generally, foods with less than 300 mg of sodium per serving fit into this eating plan.  To find whole grains, look for the word "whole" as the first word in the ingredient list. Shopping  Buy products labeled as "low-sodium" or "no salt added."  Buy fresh foods. Avoid canned foods and  premade or frozen meals. Cooking  Avoid adding salt when cooking. Use salt-free seasonings or herbs instead of table salt or sea salt. Check with your health care provider or pharmacist before using salt substitutes.  Do not fry foods. Cook foods using healthy methods such as baking, boiling, grilling, and broiling instead.  Cook with heart-healthy oils, such as olive, canola, soybean, or sunflower oil. Meal planning   Eat a balanced diet that includes: ? 5 or more servings of fruits and vegetables each day. At each meal, try to fill half of your plate with fruits and vegetables. ? Up to 6-8 servings of whole grains each day. ? Less than 6 oz of lean meat, poultry, or fish each day. A 3-oz serving of meat is about the same size as a deck of cards. One egg equals 1 oz. ? 2 servings of low-fat dairy each day. ? A serving of nuts, seeds, or beans 5 times each week. ? Heart-healthy fats. Healthy fats called Omega-3 fatty acids are found in foods such as flaxseeds and coldwater fish, like sardines, salmon, and mackerel.  Limit how much you eat of the following: ? Canned or prepackaged foods. ? Food that is high in trans fat, such as fried foods. ? Food that is high in saturated fat, such as fatty meat. ? Sweets, desserts, sugary drinks, and other foods with added sugar. ? Full-fat dairy products.  Do not salt foods before eating.  Try to eat at  least 2 vegetarian meals each week.  Eat more home-cooked food and less restaurant, buffet, and fast food.  When eating at a restaurant, ask that your food be prepared with less salt or no salt, if possible. What foods are recommended? The items listed may not be a complete list. Talk with your dietitian about what dietary choices are best for you. Grains Whole-grain or whole-wheat bread. Whole-grain or whole-wheat pasta. Brown rice. [place]. Bulgur. Whole-grain and low-sodium cereals. Pita bread. Low-fat, low-sodium crackers.  Whole-wheat flour tortillas. Vegetables Fresh or frozen vegetables (raw, steamed, roasted, or grilled). Low-sodium or reduced-sodium tomato and vegetable juice. Low-sodium or reduced-sodium tomato sauce and tomato paste. Low-sodium or reduced-sodium canned vegetables. Fruits All fresh, dried, or frozen fruit. Canned fruit in natural juice (without added sugar). Meat and other protein foods Skinless chicken or [place]. Ground chicken or [place]. Pork with fat trimmed off. Fish and seafood. Egg whites. Dried beans, peas, or lentils. Unsalted nuts, nut butters, and seeds. Unsalted canned beans. Lean cuts of beef with fat trimmed off. Low-sodium, lean deli meat. Dairy Low-fat (1%) or fat-free (skim) milk. Fat-free, low-fat, or reduced-fat cheeses. Nonfat, low-sodium ricotta or cottage cheese. Low-fat or nonfat yogurt. Low-fat, low-sodium cheese. Fats and oils Soft margarine without trans fats. Vegetable oil. Low-fat, reduced-fat, or light mayonnaise and salad dressings (reduced-sodium). Canola, safflower, olive, soybean, and sunflower oils. Avocado. Seasoning and other foods Herbs. Spices. Seasoning mixes without salt. Unsalted popcorn and pretzels. Fat-free sweets. What foods are not recommended? The items listed may not be a complete list. Talk with your dietitian about what dietary choices are best for you. Grains Baked goods made with fat, such as croissants, muffins, or some breads. Dry pasta or rice meal packs. Vegetables Creamed or fried vegetables. Vegetables in a cheese sauce. Regular canned vegetables (not low-sodium or reduced-sodium). Regular canned tomato sauce and paste (not low-sodium or reduced-sodium). Regular tomato and vegetable juice (not low-sodium or reduced-sodium). Angie Fava. Olives. Fruits Canned fruit in a light or heavy syrup. Fried fruit. Fruit in cream or butter sauce. Meat and other protein foods Fatty cuts of meat. Ribs. Fried meat. Berniece Salines. Sausage. Bologna and other  processed lunch meats. Salami. Fatback. Hotdogs. Bratwurst. Salted nuts and seeds. Canned beans with added salt. Canned or smoked fish. Whole eggs or egg yolks. Chicken or [place] with skin. Dairy Whole or 2% milk, cream, and half-and-half. Whole or full-fat cream cheese. Whole-fat or sweetened yogurt. Full-fat cheese. Nondairy creamers. Whipped toppings. Processed cheese and cheese spreads. Fats and oils Butter. Stick margarine. Lard. Shortening. Ghee. Bacon fat. Tropical oils, such as coconut, palm kernel, or palm oil. Seasoning and other foods Salted popcorn and pretzels. Onion salt, garlic salt, seasoned salt, table salt, and sea salt. Worcestershire sauce. Tartar sauce. Barbecue sauce. Teriyaki sauce. Soy sauce, including reduced-sodium. Steak sauce. Canned and packaged gravies. Fish sauce. Oyster sauce. Cocktail sauce. Horseradish that you find on the shelf. Ketchup. Mustard. Meat flavorings and tenderizers. Bouillon cubes. Hot sauce and Tabasco sauce. Premade or packaged marinades. Premade or packaged taco seasonings. Relishes. Regular salad dressings. Where to find more information:  National Heart, Lung, and [place]: https://wilson-eaton.com/  American Heart Association: www.heart.org Summary  The DASH eating plan is a healthy eating plan that has been shown to reduce high blood pressure (hypertension). It may also reduce your risk for type 2 diabetes, heart disease, and stroke.  With the DASH eating plan, you should limit salt (sodium) intake to 2,300 mg a day. If you have hypertension, you  may need to reduce your sodium intake to 1,500 mg a day.  When on the DASH eating plan, aim to eat more fresh fruits and vegetables, whole grains, lean proteins, low-fat dairy, and heart-healthy fats.  Work with your health care provider or diet and nutrition specialist (dietitian) to adjust your eating plan to your individual calorie needs. This information is not intended to replace advice given to  you by your health care provider. Make sure you discuss any questions you have with your health care provider. Document Released: 02/02/2011 Document Revised: 02/07/2016 Document Reviewed: 02/07/2016 Elsevier Interactive Patient Education  2017 [employer].    If you need a refill on your cardiac medications before your next appointment, please call your pharmacy.

## 2017-01-23 ENCOUNTER — Encounter: Payer: Self-pay | Admitting: Podiatry

## 2017-01-23 ENCOUNTER — Ambulatory Visit (INDEPENDENT_AMBULATORY_CARE_PROVIDER_SITE_OTHER): Payer: Medicare Other | Admitting: Podiatry

## 2017-01-23 DIAGNOSIS — M76822 Posterior tibial tendinitis, left leg: Secondary | ICD-10-CM

## 2017-01-23 NOTE — Progress Notes (Signed)
He presents today for follow-up of his posterior tibial tendinitis of his left foot. He continues to take the meloxicam on a regular basis. He states that he is doing very well he is 100% improved and has purchased new shoes.  Objective: Vital signs are stable he is alert and oriented 3 still has a severe pronated foot type with no pain on palpation of the posterior tibial tendon left.  Assessment: Posterior tibial tendinitis left.  Plan: Discussed etiology pathology conservative versus surgical therapies. Should this recur in the future we may have to see about getting him a pair of orthotics. Until then he will notify me with recurrences.

## 2017-03-12 ENCOUNTER — Other Ambulatory Visit: Payer: Medicare Other

## 2017-03-12 DIAGNOSIS — B20 Human immunodeficiency virus [HIV] disease: Secondary | ICD-10-CM | POA: Diagnosis not present

## 2017-03-13 ENCOUNTER — Other Ambulatory Visit: Payer: Medicare Other

## 2017-03-13 LAB — T-HELPER CELL (CD4) - (RCID CLINIC ONLY)
CD4 T CELL ABS: 1120 /uL (ref 400–2700)
CD4 T CELL HELPER: 31 % — AB (ref 33–55)

## 2017-03-14 LAB — HIV-1 RNA QUANT-NO REFLEX-BLD
HIV 1 RNA QUANT: DETECTED {copies}/mL — AB
HIV-1 RNA Quant, Log: <1.3 Log copies/mL — AB

## 2017-03-19 ENCOUNTER — Encounter (HOSPITAL_COMMUNITY): Payer: Self-pay | Admitting: Emergency Medicine

## 2017-03-19 DIAGNOSIS — R197 Diarrhea, unspecified: Secondary | ICD-10-CM | POA: Insufficient documentation

## 2017-03-19 DIAGNOSIS — Z21 Asymptomatic human immunodeficiency virus [HIV] infection status: Secondary | ICD-10-CM | POA: Insufficient documentation

## 2017-03-19 DIAGNOSIS — Z79899 Other long term (current) drug therapy: Secondary | ICD-10-CM | POA: Diagnosis not present

## 2017-03-19 DIAGNOSIS — R112 Nausea with vomiting, unspecified: Secondary | ICD-10-CM | POA: Diagnosis not present

## 2017-03-19 DIAGNOSIS — R1110 Vomiting, unspecified: Secondary | ICD-10-CM | POA: Diagnosis not present

## 2017-03-19 DIAGNOSIS — R1010 Upper abdominal pain, unspecified: Secondary | ICD-10-CM | POA: Insufficient documentation

## 2017-03-19 LAB — COMPREHENSIVE METABOLIC PANEL
ALT: 22 U/L (ref 17–63)
ANION GAP: 13 (ref 5–15)
AST: 24 U/L (ref 15–41)
Albumin: 3.8 g/dL (ref 3.5–5.0)
Alkaline Phosphatase: 91 U/L (ref 38–126)
BUN: 9 mg/dL (ref 6–20)
CHLORIDE: 102 mmol/L (ref 101–111)
CO2: 22 mmol/L (ref 22–32)
Calcium: 8.9 mg/dL (ref 8.9–10.3)
Creatinine, Ser: 1.05 mg/dL (ref 0.61–1.24)
GFR calc non Af Amer: >60 mL/min (ref >60)
Glucose, Bld: 142 mg/dL — ABNORMAL HIGH (ref 65–99)
Potassium: 3.4 mmol/L — ABNORMAL LOW (ref 3.5–5.1)
SODIUM: 137 mmol/L (ref 135–145)
Total Bilirubin: 0.7 mg/dL (ref 0.3–1.2)
Total Protein: 7.3 g/dL (ref 6.5–8.1)

## 2017-03-19 LAB — URINALYSIS, ROUTINE W REFLEX MICROSCOPIC
Bacteria, UA: NONE SEEN
Bilirubin Urine: NEGATIVE
GLUCOSE, UA: NEGATIVE mg/dL
KETONES UR: NEGATIVE mg/dL
Leukocytes, UA: NEGATIVE
Nitrite: NEGATIVE
PROTEIN: NEGATIVE mg/dL
Specific Gravity, Urine: 1.014 (ref 1.005–1.030)
pH: 6 (ref 5.0–8.0)

## 2017-03-19 LAB — CBC
HCT: 42.7 % (ref 39.0–52.0)
HEMOGLOBIN: 14.7 g/dL (ref 13.0–17.0)
MCH: 32 pg (ref 26.0–34.0)
MCHC: 34.4 g/dL (ref 30.0–36.0)
MCV: 92.8 fL (ref 78.0–100.0)
Platelets: 225 10*3/uL (ref 150–400)
RBC: 4.6 MIL/uL (ref 4.22–5.81)
RDW: 12.7 % (ref 11.5–15.5)
WBC: 9 10*3/uL (ref 4.0–10.5)

## 2017-03-19 LAB — LIPASE, BLOOD: Lipase: 30 U/L (ref 11–51)

## 2017-03-19 NOTE — ED Triage Notes (Signed)
Pt arrives from home with c/o of dizziness two weeks ago while at worl, now complaining of diarrhea and vomiting. States fluid retention.   Communicating via written notes - requires ASL interpreter.

## 2017-03-20 ENCOUNTER — Emergency Department (HOSPITAL_COMMUNITY)
Admission: EM | Admit: 2017-03-20 | Discharge: 2017-03-20 | Disposition: A | Discharge status: Home / Self Care | Payer: Medicare Other | Attending: Emergency Medicine | Admitting: Emergency Medicine

## 2017-03-20 ENCOUNTER — Emergency Department (HOSPITAL_COMMUNITY): Payer: Medicare Other

## 2017-03-20 DIAGNOSIS — R1110 Vomiting, unspecified: Secondary | ICD-10-CM | POA: Diagnosis not present

## 2017-03-20 DIAGNOSIS — K219 Gastro-esophageal reflux disease without esophagitis: Secondary | ICD-10-CM

## 2017-03-20 DIAGNOSIS — R197 Diarrhea, unspecified: Secondary | ICD-10-CM | POA: Diagnosis not present

## 2017-03-20 DIAGNOSIS — R109 Unspecified abdominal pain: Secondary | ICD-10-CM

## 2017-03-20 MED ORDER — IOPAMIDOL (ISOVUE-300) INJECTION 61%
INTRAVENOUS | Status: AC
  Administered 2017-03-20: 100 mL
  Filled 2017-03-20: qty 100

## 2017-03-20 MED ORDER — DICYCLOMINE HCL 20 MG PO TABS: 20.0000 mg | ORAL_TABLET | Freq: Three times a day (TID) | ORAL | 0 refills | Status: DC

## 2017-03-20 MED ORDER — SODIUM CHLORIDE 0.9 % IV BOLUS (SEPSIS)
1000.0000 mL | Freq: Once | INTRAVENOUS | Status: AC
  Administered 2017-03-20: 1000 mL via INTRAVENOUS

## 2017-03-20 MED ORDER — SODIUM CHLORIDE 0.9 % IV BOLUS (SEPSIS)
500.0000 mL | Freq: Once | INTRAVENOUS | Status: AC
  Administered 2017-03-20: 500 mL via INTRAVENOUS

## 2017-03-20 MED ORDER — ONDANSETRON HCL 4 MG/2ML IJ SOLN
4.0000 mg | Freq: Once | INTRAMUSCULAR | Status: AC
  Administered 2017-03-20: 4 mg via INTRAVENOUS
  Filled 2017-03-20: qty 2

## 2017-03-20 MED ORDER — ONDANSETRON HCL 4 MG PO TABS: 4.0000 mg | ORAL_TABLET | Freq: Three times a day (TID) | ORAL | 0 refills | Status: DC | PRN

## 2017-03-20 MED ORDER — OMEPRAZOLE 20 MG PO CPDR
DELAYED_RELEASE_CAPSULE | ORAL | 0 refills | Status: DC
Start: 2017-03-20 — End: 2017-04-26

## 2017-03-20 NOTE — ED Provider Notes (Signed)
[facility] EMERGENCY DEPARTMENT Provider Note   CSN: [id number] Arrival date & time: 03/19/17  2240  Time seen 01:40 AM  History   Chief Complaint Chief Complaint  Patient presents with  . Abdominal Pain   Sign interpreter was used  HPI Matthew Fitzpatrick is a 37 y.o. male.  HPI patient reports he has been having upper abdominal pain off and on since November.  He states the pain comes and goes and can last over 20 minutes at a time.  He states if he is hungry it makes the pain worse, if he eats it makes the pain better however it then causes diarrhea.  He describes the pain as sharp.  He states he gets it every day.  He has had nausea daily but the last time he had vomiting was 2 weeks ago.  He reports a lot of vomiting 2 weeks ago.  He is having diarrhea 4-8 episodes of watery diarrhea a day.  He started taking Imodium, Tums, and Advil over-the-counter the third week of December without relief.  He states sometimes he feels dizzy on standing.  He denies dry tongue, feeling thirsty, or difficulty urinating.  He also denies any blood in the diarrhea.  He states he is never had abdominal problems before.  The pain does not radiate.  PCP [name], NP   Past Medical History:  Diagnosis Date  . Acute conjunctivitis 02/12/2014  . Acute gastroenteritis 08/12/2015  . ANXIETY 03/02/2006   Qualifier: Diagnosis of  By: [name] MD, [name]    . Bronchitis 01/26/2011  . CAP (community acquired pneumonia)   . CHICKENPOX, HX OF 06/20/2006   Annotation: age 28 Qualifier: Diagnosis of  By: [name] MD, Jenny Reichmann    . COUGH, CHRONIC 12/23/2008   Qualifier: Diagnosis of  By: [name] MD, [name]    . Deaf   . 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 DEPRESSION 03/02/2006   Qualifier: Diagnosis of  By: [name] MD, [name]    . Dyslipidemia 07/02/2012  . 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 FACIAL RASH 11/17/2008   Qualifier: Diagnosis of  By: [name] MD, [name]    . GERD 07/20/2009   Qualifier: Diagnosis of  By: [name] MD, [name]    . GERD (gastroesophageal reflux disease)   . HIV disease ([place])   . Obesity (BMI 30.0-34.9) 04/20/2016  . PEDICULOSIS PUBIS (PUBIC LOUSE) 06/20/2006   Annotation: 4/06 Qualifier: Diagnosis of  By: [name] MD, [name]    . Poor dentition 09/21/2016  . 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 Pulmonary nodule   . SINUSITIS, CHRONIC 02/18/2009   Qualifier: Diagnosis of  By: Tommy Medal MD, Roderic Scarce    . Urinary frequency 04/06/2009   Qualifier: Diagnosis of  By: [name] MD, [name]      Patient Active Problem List   Diagnosis Date Noted  . Palpitations 10/19/2016  . Dyspnea on exertion 10/19/2016  . Poor dentition 09/21/2016  . Pulmonary nodule 08/17/2016  . Obesity (BMI 30.0-34.9) 04/20/2016  . Acute conjunctivitis 02/12/2014  . Dyslipidemia 07/02/2012  . GERD (gastroesophageal reflux disease) 06/28/2010  . 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 Human immunodeficiency virus (HIV) disease ([place]) 03/02/2006  . ANXIETY 03/02/2006  . DEPRESSION 03/02/2006  .  ALLERGIC RHINITIS 03/02/2006    History reviewed. No pertinent surgical history.     Home Medications    Prior to Admission medications   Medication Sig Start Date End Date Taking? Authorizing Provider  DESCOVY 200-25 MG tablet TAKE 1 TABLET BY MOUTH DAILY 10/25/16   Michel Bickers, MD  dicyclomine (BENTYL) 20 MG tablet Take 1 tablet (20 mg total) by mouth 4 (four) times daily -  before meals and at bedtime. 03/20/17   Rolland Porter, MD  meloxicam (MOBIC) 15 MG tablet Take 1 tablet (15 mg total) by mouth daily. 12/26/16   Hyatt, Max T, DPM  omeprazole (PRILOSEC) 20 MG capsule Take 1 po BID x 2 weeks then once a day 03/20/17   Rolland Porter, MD  ondansetron (ZOFRAN) 4 MG tablet Take 1  tablet (4 mg total) by mouth every 8 (eight) hours as needed for nausea or vomiting. 03/20/17   Rolland Porter, MD  PREZCOBIX 800-150 MG tablet TAKE 1 TABLET BY MOUTH DAILY. SWALLOW WHOLE. DO NOT CRUSH, BREAK OR CHEW TABLETS.TK WITH FOOD 10/25/16   Michel Bickers, MD    Family History Family History  Problem Relation Age of Onset  . Headache Mother   . Hypertension Mother   . Heart attack Father 61  . Sudden Cardiac Death Neg Hx     Social History Social History   Tobacco Use  . Smoking status: Never Smoker  . Smokeless tobacco: Never Used  Substance Use Topics  . Alcohol use: No    Alcohol/week: 0.0 oz  . Drug use: No  pt is deaf   Allergies   Patient has no known allergies.   Review of Systems Review of Systems  All other systems reviewed and are negative.    Physical Exam Updated Vital Signs BP 127/81   Pulse 68   Temp 98.4 F (36.9 C) (Oral)   Resp 16   SpO2 94%   Physical Exam  Constitutional: He is oriented to person, place, and time. He appears well-developed and well-nourished.  Non-toxic appearance. He does not appear ill. No distress.  HENT:  Head: Normocephalic and atraumatic.  Right Ear: External ear normal.  Left Ear: External ear normal.  Nose: Nose normal. No mucosal edema or rhinorrhea.  Mouth/Throat: Mucous membranes are dry. No dental abscesses or uvula swelling.  Eyes: Conjunctivae and EOM are normal. Pupils are equal, round, and reactive to light.  Neck: Normal range of motion and full passive range of motion without pain. Neck supple.  Cardiovascular: Normal rate, regular rhythm and normal heart sounds. Exam reveals no gallop and no friction rub.  No murmur heard. Pulmonary/Chest: Effort normal and breath sounds normal. No respiratory distress. He has no wheezes. He has no rhonchi. He has no rales. He exhibits no tenderness and no crepitus.  Abdominal: Soft. Normal appearance and bowel sounds are normal. He exhibits no distension. There is  tenderness. There is no rebound and no guarding.    Musculoskeletal: Normal range of motion. He exhibits no edema or tenderness.  Moves all extremities well.   Neurological: He is alert and oriented to person, place, and time. He has normal strength. No cranial nerve deficit.  Skin: Skin is warm, dry and intact. No rash noted. No erythema. No pallor.  Psychiatric: He has a normal mood and affect. His speech is normal and behavior is normal. His mood appears not anxious.  Nursing note and vitals reviewed.    ED Treatments / Results  Labs (all labs ordered are  listed, but only abnormal results are displayed) Results for orders placed or performed during the hospital encounter of 03/20/17  Lipase, blood  Result Value Ref Range   Lipase 30 11 - 51 U/L  Comprehensive metabolic panel  Result Value Ref Range   Sodium 137 135 - 145 mmol/L   Potassium 3.4 (L) 3.5 - 5.1 mmol/L   Chloride 102 101 - 111 mmol/L   CO2 22 22 - 32 mmol/L   Glucose, Bld 142 (H) 65 - 99 mg/dL   BUN 9 6 - 20 mg/dL   Creatinine, Ser 1.05 0.61 - 1.24 mg/dL   Calcium 8.9 8.9 - 10.3 mg/dL   Total Protein 7.3 6.5 - 8.1 g/dL   Albumin 3.8 3.5 - 5.0 g/dL   AST 24 15 - 41 U/L   ALT 22 17 - 63 U/L   Alkaline Phosphatase 91 38 - 126 U/L   Total Bilirubin 0.7 0.3 - 1.2 mg/dL   GFR calc non Af Amer >60 >60 mL/min   GFR calc Af Amer >60 >60 mL/min   Anion gap 13 5 - 15  CBC  Result Value Ref Range   WBC 9.0 4.0 - 10.5 K/uL   RBC 4.60 4.22 - 5.81 MIL/uL   Hemoglobin 14.7 13.0 - 17.0 g/dL   HCT 42.7 39.0 - 52.0 %   MCV 92.8 78.0 - 100.0 fL   MCH 32.0 26.0 - 34.0 pg   MCHC 34.4 30.0 - 36.0 g/dL   RDW 12.7 11.5 - 15.5 %   Platelets 225 150 - 400 K/uL  Urinalysis, Routine w reflex microscopic  Result Value Ref Range   Color, Urine YELLOW YELLOW   APPearance CLEAR CLEAR   Specific Gravity, Urine 1.014 1.005 - 1.030   pH 6.0 5.0 - 8.0   Glucose, UA NEGATIVE NEGATIVE mg/dL   Hgb urine dipstick SMALL (A) NEGATIVE    Bilirubin Urine NEGATIVE NEGATIVE   Ketones, ur NEGATIVE NEGATIVE mg/dL   Protein, ur NEGATIVE NEGATIVE mg/dL   Nitrite NEGATIVE NEGATIVE   Leukocytes, UA NEGATIVE NEGATIVE   RBC / HPF 0-5 0 - 5 RBC/hpf   WBC, UA 0-5 0 - 5 WBC/hpf   Bacteria, UA NONE SEEN NONE SEEN   Squamous Epithelial / LPF 0-5 (A) NONE SEEN   Mucus PRESENT    Laboratory interpretation all normal except mild hypokalemia     EKG  EKG Interpretation None       Radiology Ct Abdomen Pelvis W Contrast  Result Date: 03/20/2017 CLINICAL DATA:  Diarrhea and vomiting. History of urinary frequency, proctitis, HIV, and gastroesophageal reflux disease. EXAM: CT ABDOMEN AND PELVIS WITH CONTRAST TECHNIQUE: Multidetector CT imaging of the abdomen and pelvis was performed using the standard protocol following bolus administration of intravenous contrast. CONTRAST:  173m ISOVUE-300 IOPAMIDOL (ISOVUE-300) INJECTION 61% COMPARISON:  08/02/2016 FINDINGS: Lower chest: Lung bases are clear. Hepatobiliary: Gallbladder is contracted. No bile duct dilatation. No focal liver lesions. Pancreas: Unremarkable. No pancreatic ductal dilatation or surrounding inflammatory changes. Spleen: Normal in size without focal abnormality. Adrenals/Urinary Tract: Adrenal glands are unremarkable. Kidneys are normal, without renal calculi, focal lesion, or hydronephrosis. Bladder is unremarkable. Stomach/Bowel: Stomach is within normal limits. Appendix appears normal. No evidence of bowel wall thickening, distention, or inflammatory changes. Vascular/Lymphatic: No significant vascular findings are present. No enlarged abdominal or pelvic lymph nodes. Reproductive: Prostate is unremarkable. Other: No abdominal wall hernia or abnormality. No abdominopelvic ascites. Musculoskeletal: No acute or significant osseous findings. IMPRESSION: No acute process demonstrated in  the abdomen or pelvis. No evidence of bowel obstruction or inflammation. Electronically Signed   By:  Lucienne Capers M.D.   On: 03/20/2017 03:32    Procedures Procedures (including critical care time)  Medications Ordered in ED Medications  sodium chloride 0.9 % bolus 1,000 mL (0 mLs Intravenous Stopped 03/20/17 0357)  sodium chloride 0.9 % bolus 500 mL (0 mLs Intravenous Stopped 03/20/17 0357)  ondansetron (ZOFRAN) injection 4 mg (4 mg Intravenous Given 03/20/17 0231)  iopamidol (ISOVUE-300) 61 % injection (100 mLs  Contrast Given 03/20/17 0255)     Initial Impression / Assessment and Plan / ED Course  I have reviewed the triage vital signs and the nursing notes.  Pertinent labs & imaging results that were available during my care of the patient were reviewed by me and considered in my medical decision making (see chart for details).     Patient was given IV fluids, IV nausea medication.  CT scan was done to evaluate his complaints of abdominal pain for the past several months.  4:30 AM through the sliding language interpreter patient was informed his CT scan was normal.  He will be started on Bentyl and Prilosec for his abdominal pain.  He can follow-up with his primary care doctor to get referred to a gastroenterologist if he continues to have discomfort.  Final Clinical Impressions(s) / ED Diagnoses   Final diagnoses:  Abdominal pain, unspecified abdominal location    ED Discharge Orders        Ordered    omeprazole (PRILOSEC) 20 MG capsule     03/20/17 0441    dicyclomine (BENTYL) 20 MG tablet  3 times daily before meals & bedtime     03/20/17 0441    ondansetron (ZOFRAN) 4 MG tablet  Every 8 hours PRN     03/20/17 0441     Plan discharge  Rolland Porter, MD, Barbette Or, MD 03/20/17 [phone]

## 2017-03-20 NOTE — ED Notes (Addendum)
ASL tele-interpreter used for communication. Left at bedside for provider.

## 2017-03-20 NOTE — Discharge Instructions (Signed)
Take the medication as prescribed. If you are still taking the omeprazole/prilosec take the new directions. Follow up with your primary care to see if you need a referral to a gastroenterologist if you continue to have pain.

## 2017-03-20 NOTE — ED Notes (Signed)
Pt called for treatment room no answer

## 2017-03-27 ENCOUNTER — Ambulatory Visit (INDEPENDENT_AMBULATORY_CARE_PROVIDER_SITE_OTHER): Payer: Medicare Other | Admitting: Internal Medicine

## 2017-03-27 DIAGNOSIS — R109 Unspecified abdominal pain: Secondary | ICD-10-CM | POA: Insufficient documentation

## 2017-03-27 DIAGNOSIS — B20 Human immunodeficiency virus [HIV] disease: Secondary | ICD-10-CM

## 2017-03-27 DIAGNOSIS — R197 Diarrhea, unspecified: Secondary | ICD-10-CM

## 2017-03-27 DIAGNOSIS — R1013 Epigastric pain: Secondary | ICD-10-CM

## 2017-03-27 MED ORDER — BICTEGRAVIR-EMTRICITAB-TENOFOV 50-200-25 MG PO TABS: 1.0000 | ORAL_TABLET | Freq: Every day | ORAL | 11 refills | Status: DC

## 2017-03-27 MED ORDER — DARUN-COBIC-EMTRICIT-TENOFAF 800-150-200-10 MG PO TABS: 1.0000 | ORAL_TABLET | Freq: Every day | ORAL | 11 refills | Status: DC

## 2017-03-27 MED FILL — BIKTARVY 50-200-25 MG TABS: 50-200-25 | 30 days supply | Qty: 30 | Fill #0

## 2017-03-27 NOTE — Progress Notes (Addendum)
Patient Active Problem List   Diagnosis Date Noted  . Human immunodeficiency virus (HIV) disease ([place]) 03/02/2006    Priority: High  . Abdominal pain 03/27/2017  . Poor dentition 09/21/2016  . Pulmonary nodule 08/17/2016  . Obesity (BMI 30.0-34.9) 04/20/2016  . Diarrhea 07/02/2012  . Dyslipidemia 07/02/2012  . GERD (gastroesophageal reflux disease) 06/28/2010  . 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 ANXIETY 03/02/2006  . DEPRESSION 03/02/2006  . ALLERGIC RHINITIS 03/02/2006    Patient's Medications  New Prescriptions   No medications on file  Previous Medications   DICYCLOMINE (BENTYL) 20 MG TABLET    Take 1 tablet (20 mg total) by mouth 4 (four) times daily -  before meals and at bedtime.   MELOXICAM (MOBIC) 15 MG TABLET    Take 1 tablet (15 mg total) by mouth daily.   OMEPRAZOLE (PRILOSEC) 20 MG CAPSULE    Take 1 po BID x 2 weeks then once a day   ONDANSETRON (ZOFRAN) 4 MG TABLET    Take 1 tablet (4 mg total) by mouth every 8 (eight) hours as needed for nausea or vomiting.  Modified Medications   No medications on file  Discontinued Medications   DESCOVY 200-25 MG TABLET    TAKE 1 TABLET BY MOUTH DAILY   PREZCOBIX 800-150 MG TABLET    TAKE 1 TABLET BY MOUTH DAILY. SWALLOW WHOLE. DO NOT CRUSH, BREAK OR CHEW TABLETS.TK WITH FOOD    Subjective: Matthew Fitzpatrick is in for his routine HIV follow-up visit.  He has had no problems obtaining, taking or tolerating his Descovy or Prezcobix.  He denies missing any doses.  He says that he has been bothered by abdominal pain, nausea, vomiting and diarrhea since November.  He says that the pain gets worse when he eats.  He denies feeling like he has any acid reflux.  He says that he got so bad he went to the emergency department on 03/20/2017.  CT scan of his abdomen and pelvis did not show any abnormalities.  He was started on Prilosec in bed until and states that his abdominal pain has not gotten  any better.  He says that his nausea and vomiting have resolved.  His diarrhea is a little bit better but he still having 3-5 watery bowel movements daily.  He has not attempted to notify his primary care provider.  Review of Systems: Review of Systems  Constitutional: Positive for weight loss. Negative for chills, diaphoresis and fever.  HENT: Negative for congestion and sore throat.   Respiratory: Negative for cough, sputum production and shortness of breath.   Cardiovascular: Negative for chest pain.  Gastrointestinal: Positive for abdominal pain, diarrhea, nausea and vomiting. Negative for constipation and heartburn.  Genitourinary: Negative for dysuria.  Skin: Negative for rash.  Neurological: Negative for headaches.  Psychiatric/Behavioral: Negative for depression and substance abuse. The patient is not nervous/anxious.     Past Medical History:  Diagnosis Date  . Acute conjunctivitis 02/12/2014  . Acute gastroenteritis 08/12/2015  . ANXIETY 03/02/2006   Qualifier: Diagnosis of  By: [name] MD, Gevin Perea    . Bronchitis 01/26/2011  . CAP (community acquired pneumonia)   . CHICKENPOX, HX OF 06/20/2006   Annotation: age 51 Qualifier: Diagnosis of  By: [name] MD, Jenny Reichmann    . COUGH, CHRONIC 12/23/2008   Qualifier: Diagnosis of  By: [name] MD, Christophere Hillhouse    . Deaf   . 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 DEPRESSION 03/02/2006   Qualifier: Diagnosis of  By: [name] MD, Draya Felker    . Dyslipidemia 07/02/2012  . 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 FACIAL RASH 11/17/2008   Qualifier: Diagnosis of  By: [name] MD, Finnbar Cedillos    . GERD 07/20/2009   Qualifier: Diagnosis of  By: [name] MD, Somalia Segler    . GERD (gastroesophageal reflux disease)   . HIV disease ([place])   . Obesity (BMI 30.0-34.9) 04/20/2016  . PEDICULOSIS PUBIS (PUBIC LOUSE) 06/20/2006   Annotation: 4/06 Qualifier: Diagnosis of  By: [name] MD,  Shepard Keltz    . Poor dentition 09/21/2016  . 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 Pulmonary nodule   . SINUSITIS, CHRONIC 02/18/2009   Qualifier: Diagnosis of  By: Tommy Medal MD, Roderic Scarce    . Urinary frequency 04/06/2009   Qualifier: Diagnosis of  By: [name] MD, Nevaen Tredway      Social History   Tobacco Use  . Smoking status: Never Smoker  . Smokeless tobacco: Never Used  Substance Use Topics  . Alcohol use: No    Alcohol/week: 0.0 oz  . Drug use: No    Family History  Problem Relation Age of Onset  . Headache Mother   . Hypertension Mother   . Heart attack Father 13  . Sudden Cardiac Death Neg Hx     No Known Allergies  Health Maintenance  Topic Date Due  . TETANUS/TDAP  10/26/1998  . INFLUENZA VACCINE  09/27/2016  . HIV Screening  Completed    Objective:  Vitals:   03/27/17 0930  BP: 133/83  Pulse: 66  Temp: 98.2 F (36.8 C)  Weight: 221 lb (100.2 kg)  Height: 5' 8"  (1.727 m)   Body mass index is 33.6 kg/m.  Physical Exam  Constitutional: He is oriented to person, place, and time.  He is alert, pleasant and in no distress.  I examined him with the aid of the sign language interpreter on the iPad.  HENT:  Mouth/Throat: No oropharyngeal exudate.  Eyes: Conjunctivae are normal.  Cardiovascular: Normal rate and regular rhythm.  No murmur heard. Pulmonary/Chest: Breath sounds normal.  Abdominal: Soft. He exhibits no distension and no mass. There is tenderness. There is no guarding.  Mild epigastric tenderness with palpation.  Musculoskeletal: Normal range of motion.  Neurological: He is alert and oriented to person, place, and time.  Skin: No rash noted.  Psychiatric: Mood and affect normal.    Lab Results Lab Results  Component Value Date   WBC 9.0 03/19/2017   HGB 14.7 03/19/2017   HCT 42.7 03/19/2017   MCV 92.8 03/19/2017   PLT 225 03/19/2017    Lab Results  Component Value Date    CREATININE 1.05 03/19/2017   BUN 9 03/19/2017   NA 137 03/19/2017   K 3.4 (L) 03/19/2017   CL 102 03/19/2017   CO2 22 03/19/2017    Lab Results  Component Value Date   ALT 22 03/19/2017   AST 24 03/19/2017   ALKPHOS 91 03/19/2017   BILITOT 0.7 03/19/2017    Lab Results  Component Value Date   CHOL 218 (H) 03/02/2016   HDL 45 03/02/2016   LDLCALC 145 (H) 03/02/2016   TRIG 141 03/02/2016   CHOLHDL 4.8 03/02/2016   Lab Results  Component Value Date   LABRPR NON REAC 09/21/2016   HIV 1 RNA Quant (copies/mL)  Date Value  03/12/2017 <20 DETECTED (A)  09/21/2016 <20 DETECTED (A)  03/02/2016 59 (H)   CD4 T Cell Abs (/uL)  Date Value  03/12/2017 1,120  09/21/2016 1,250  03/02/2016 1,030     Problem List Items Addressed This Visit      High   Human immunodeficiency virus (HIV) disease ([place])    His infection remains under excellent, long control.  We attempted to simplify his regimen to once daily Symtuza but it was not covered by his insurance.  Therefore, we switched him to [place].  He will follow-up after lab work in 6 months.      Relevant Orders   T-helper cell (CD4)- ([facility] only)   HIV 1 RNA quant-no reflex-bld     Unprioritized   Abdominal pain    I am not sure what is causing his abdominal pain but this has been a chronic, intermittent problem.  I encouraged him to set up a visit with his primary care provider.      Diarrhea    I have asked him to bring in a stool sample as soon as he can screen for infectious diarrhea.      Relevant Orders   Clostridium Difficile by PCR([facility])   Gastrointestinal Panel by PCR , Stool        Michel Bickers, MD [facility] for [place] [phone] pager   [phone] cell 03/27/2017, 10:28 AM

## 2017-03-27 NOTE — Assessment & Plan Note (Signed)
I have asked him to bring in a stool sample as soon as he can screen for infectious diarrhea.

## 2017-03-27 NOTE — Addendum Note (Signed)
Addended by: Michel Bickers on: 03/27/2017 10:28 AM   Modules accepted: Orders

## 2017-03-27 NOTE — Assessment & Plan Note (Signed)
I am not sure what is causing his abdominal pain but this has been a chronic, intermittent problem.  I encouraged him to set up a visit with his primary care provider.

## 2017-03-27 NOTE — Progress Notes (Signed)
[facility] Disease Pharmacy Visit  HPI: Matthew Fitzpatrick is a 37 y.o. male who presents to the [facility] clinic today to follow-up with Dr. [name] for his HIV infection.  Patient Active Problem List   Diagnosis Date Noted  . Abdominal pain 03/27/2017  . Poor dentition 09/21/2016  . Pulmonary nodule 08/17/2016  . Obesity (BMI 30.0-34.9) 04/20/2016  . Diarrhea 07/02/2012  . Dyslipidemia 07/02/2012  . GERD (gastroesophageal reflux disease) 06/28/2010  . 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 Human immunodeficiency virus (HIV) disease ([place]) 03/02/2006  . ANXIETY 03/02/2006  . DEPRESSION 03/02/2006  . ALLERGIC RHINITIS 03/02/2006    Patient's Medications  New Prescriptions   BICTEGRAVIR-EMTRICITABINE-TENOFOVIR AF (BIKTARVY) 50-200-25 MG TABS TABLET    Take 1 tablet by mouth daily.  Previous Medications   DICYCLOMINE (BENTYL) 20 MG TABLET    Take 1 tablet (20 mg total) by mouth 4 (four) times daily -  before meals and at bedtime.   MELOXICAM (MOBIC) 15 MG TABLET    Take 1 tablet (15 mg total) by mouth daily.   OMEPRAZOLE (PRILOSEC) 20 MG CAPSULE    Take 1 po BID x 2 weeks then once a day   ONDANSETRON (ZOFRAN) 4 MG TABLET    Take 1 tablet (4 mg total) by mouth every 8 (eight) hours as needed for nausea or vomiting.  Modified Medications   No medications on file  Discontinued Medications   DESCOVY 200-25 MG TABLET    TAKE 1 TABLET BY MOUTH DAILY   PREZCOBIX 800-150 MG TABLET    TAKE 1 TABLET BY MOUTH DAILY. SWALLOW WHOLE. DO NOT CRUSH, BREAK OR CHEW TABLETS.TK WITH FOOD    Allergies: No Known Allergies  Past Medical History: Past Medical History:  Diagnosis Date  . Acute conjunctivitis 02/12/2014  . Acute gastroenteritis 08/12/2015  . ANXIETY 03/02/2006   Qualifier: Diagnosis of  By: [name] MD, [name]    . Bronchitis 01/26/2011  . CAP (community acquired pneumonia)   . CHICKENPOX, HX OF 06/20/2006   Annotation: age  51 Qualifier: Diagnosis of  By: [name] MD, Jenny Reichmann    . COUGH, CHRONIC 12/23/2008   Qualifier: Diagnosis of  By: [name] MD, [name]    . Deaf   . 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 DEPRESSION 03/02/2006   Qualifier: Diagnosis of  By: [name] MD, [name]    . Dyslipidemia 07/02/2012  . 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 FACIAL RASH 11/17/2008   Qualifier: Diagnosis of  By: [name] MD, [name]    . GERD 07/20/2009   Qualifier: Diagnosis of  By: [name] MD, [name]    . GERD (gastroesophageal reflux disease)   . HIV disease ([place])   . Obesity (BMI 30.0-34.9) 04/20/2016  . PEDICULOSIS PUBIS ([place]) 06/20/2006   Annotation: 4/06 Qualifier: Diagnosis of  By: [name] MD, [name]    . Poor dentition 09/21/2016  . 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 Pulmonary nodule   . SINUSITIS, CHRONIC 02/18/2009   Qualifier: Diagnosis of  By: Tommy Medal MD, Roderic Scarce    . Urinary frequency 04/06/2009   Qualifier: Diagnosis of  By: [name] MD, [name]      Social History: Social History   Socioeconomic History  . Marital status: Single  Spouse name: Not on file  . Number of children: Not on file  . Years of education: Not on file  . Highest education level: Not on file  Social Needs  . Financial resource strain: Not on file  . Food insecurity - worry: Not on file  . Food insecurity - inability: Not on file  . Transportation needs - medical: Not on file  . Transportation needs - non-medical: Not on file  Occupational History  . Not on file  Tobacco Use  . Smoking status: Never Smoker  . Smokeless tobacco: Never Used  Substance and Sexual Activity  . Alcohol use: No    Alcohol/week: 0.0 oz  . Drug use: No  . Sexual activity: No    Comment: given condoms  Other Topics Concern  . 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[place]   Enjoys working on [employer]   No pets    Labs: HIV 1 RNA Quant (copies/mL)  Date Value  03/12/2017 <20 DETECTED (A)  09/21/2016 <20 DETECTED (A)  03/02/2016 59 (H)   CD4 T Cell Abs (/uL)  Date Value  03/12/2017 1,120  09/21/2016 1,250  03/02/2016 1,030   Hep B S Ab (no units)  Date Value  04/23/2006 NO   Hepatitis B Surface Ag (no units)  Date Value  04/23/2006 NO   HCV Ab (no units)  Date Value  04/23/2006 NO    Lipids:    Component Value Date/Time   CHOL 218 (H) 03/02/2016 1628   TRIG 141 03/02/2016 1628   HDL 45 03/02/2016 1628   CHOLHDL 4.8 03/02/2016 1628   VLDL 28 03/02/2016 1628   LDLCALC 145 (H) 03/02/2016 1628    Current HIV Regimen: Prezcobix + Descovy  Assessment: Matthew Fitzpatrick is here today to follow-up with Dr. [name] for his HIV infection.  He is doing well on his Prezcobix + Descovy and having no issues.  No missed doses and an undetectable HIV viral load. Dr. [name] wanted to switch him to [facility], but his Medicare insurance plan does not cover it yet. We will switch him to [facility] instead which is covered.  It may also help with his diarrhea being off of a PI if it is medication induced.  I spent time through interpreter discussing Biktarvy with him including to take it at the same time every day and miss no doses.  I told him he didn't have to take it with food but if it helps with nausea then to do that. He is having trouble with [facility] stating he calls sometimes and they do not answer or put him on hold for several minutes to an hour.  I had Inez Catalina check and we will fill it at [facility].  We were able to get PA[facility] to cover his $1000 co-pay.  We will ship it to him.  He has an interpreter on his cell phone that automatically comes up when someone calls him.  I told him to call me ASAP with any issues.  I also told him  to stop his current medications for HIV (Prezcobix + Descovy) once he receives the [place] in the mail.   Plan: - Stop Prezcobix + Descovy - Start Biktarvy PO once daily - Mail from [facility]. Dailey Buccheri, PharmD, [place], [place] for Infectious  Disease 03/27/2017, 11:46 AM

## 2017-03-27 NOTE — Assessment & Plan Note (Addendum)
His infection remains under excellent, long control.  We attempted to simplify his regimen to once daily Symtuza but it was not covered by his insurance.  Therefore, we switched him to [place].  He will follow-up after lab work in 6 months.

## 2017-03-27 NOTE — Addendum Note (Signed)
Addended by: Magda Kiel L on: 03/27/2017 12:01 PM   Modules accepted: Orders

## 2017-03-27 NOTE — Addendum Note (Signed)
Addended by: Dolan Amen D on: 03/27/2017 01:50 PM   Modules accepted: Orders

## 2017-03-30 LAB — GASTROINTESTINAL PATHOGEN PANEL PCR
C. DIFFICILE TOX A/B, PCR: NOT DETECTED
CAMPYLOBACTER, PCR: NOT DETECTED
CRYPTOSPORIDIUM, PCR: NOT DETECTED
E COLI 0157, PCR: NOT DETECTED
E coli (ETEC) LT/ST PCR: NOT DETECTED
E coli (STEC) stx1/stx2, PCR: NOT DETECTED
GIARDIA LAMBLIA, PCR: NOT DETECTED
Norovirus, PCR: NOT DETECTED
Rotavirus A, PCR: NOT DETECTED
SHIGELLA, PCR: NOT DETECTED
Salmonella, PCR: NOT DETECTED

## 2017-03-30 LAB — CLOSTRIDIUM DIFFICILE TOXIN B, QUALITATIVE, REAL-TIME PCR: CDIFFPCR: NOT DETECTED

## 2017-03-30 LAB — TIQ-NTM

## 2017-04-26 ENCOUNTER — Encounter: Payer: Self-pay | Admitting: Internal Medicine

## 2017-04-26 ENCOUNTER — Ambulatory Visit (INDEPENDENT_AMBULATORY_CARE_PROVIDER_SITE_OTHER): Payer: Medicare Other | Admitting: Internal Medicine

## 2017-04-26 VITALS — BP 140/82 | HR 96 | Ht 68.0 in | Wt 222.6 lb

## 2017-04-26 DIAGNOSIS — R002 Palpitations: Secondary | ICD-10-CM | POA: Diagnosis not present

## 2017-04-26 DIAGNOSIS — R0609 Other forms of dyspnea: Secondary | ICD-10-CM | POA: Diagnosis not present

## 2017-04-26 DIAGNOSIS — R0789 Other chest pain: Secondary | ICD-10-CM

## 2017-04-26 DIAGNOSIS — E669 Obesity, unspecified: Secondary | ICD-10-CM | POA: Diagnosis not present

## 2017-04-26 MED ORDER — METOPROLOL TARTRATE 25 MG PO TABS: 25.0000 mg | ORAL_TABLET | Freq: Two times a day (BID) | ORAL | 3 refills | Status: DC

## 2017-04-26 NOTE — Patient Instructions (Addendum)
Medication Instructions:  START Metoprolol Titrate 25 mg twice per day  -- If you need a refill on your cardiac medications before your next appointment, please call your pharmacy. --  Labwork: None ordered  Testing/Procedures: Coronary CTA  Please schedule  Follow-Up: Your physician wants you to follow-up in: 6 weeks with APP     Thank you for choosing [facility]!!    Any Other Special Instructions Will Be Listed Below (If Applicable).   Please arrive at the [facility] main entrance of [facility] at ______ AM (30-45 minutes prior to test start time)  [facility] [address] [place], [place] [address] [phone]  Proceed to the [facility] Radiology Department (First Floor).  Please follow these instructions carefully (unless otherwise directed):  Hold all erectile dysfunction medications at least 48 hours prior to test.  On the Night Before the Test: . Drink plenty of water. . Do not consume any caffeinated/decaffeinated beverages or chocolate 12 hours prior to your test. On the Day of the Test: . Drink plenty of water. Do not drink any water within one hour of the test. . Do not eat any food 4 hours prior to the test. . You may take your regular medications prior to the test. . IF NOT ON A BETA BLOCKER - Take 50 mg of lopressor (metoprolol) one hour before the test. . HOLD Furosemide morning of the test.  After the Test: . Drink plenty of water. . After receiving IV contrast, you may experience a mild flushed feeling. This is normal. . On occasion, you may experience a mild rash up to 24 hours after the test. This is not dangerous. If this occurs, you can take Benadryl 25 mg and increase your fluid intake. . If you experience trouble breathing, this can be serious. If it is severe call 911 IMMEDIATELY. If it is mild, please call our office. . If you take any of these medications: Glipizide/Metformin, Avandament, Glucavance, please do  not take 48 hours after completing test.

## 2017-04-26 NOTE — Progress Notes (Signed)
Follow-up Outpatient Visit Date: 04/26/2017  Primary Care Provider: [name], NP [place] [address] [place] [address]  Chief Complaint: Dyspnea on exertion  HPI:  Mr. Mcneish is a 37 y.o. year-old male with history of dyslipidemia, HIV, and congenital deafness, who presents for follow-up of shortness of breath. I last saw him in November, at which time he felt stable to slightly better than at our initial visit. He continues to have exertional dyspnea, PND, orthopnea. Rare palpitations (once every 3-4 months) as well as chest pain with deep inspiration were also reported. We discussed coronary CTA, but Mr. Rallo wished to defer the test as he felt that his symptoms were due to weight gain and fluid retention in the setting of steroid and NSAID use.  Today, Mr. Cahalan reports that his exertional dyspnea is about the same. He is not exercising regularly nor has he made many dietary changes. He is interesting in modifying his diet but notes that he gets most of his meals at [facility]. He endorses occasional vague chest discomfort, though when questioned further, describes exertional dyspnea out of proportion to his activity. He has noticed this most when climbing a flight of stairs with groceries and feeling quite out of breath upon reaching the top of the staircase. He denies lightheadedness, orthopnea, PND, and edema. Palpitations have remained rare, with the most recent episode occurring 2-3 months ago. No associated symptoms noted with the palpitations.  --------------------------------------------------------------------------------------------------  Cardiovascular History & Procedures: Cardiovascular Problems:  Dyspnea on exertion  Palpitations  Risk Factors:  Dyslipidemia, male gender, and HIV  Cath/PCI:  None  CV Surgery:  None  EP Procedures and Devices:  None  Non-Invasive Evaluation(s):  Exercise tolerance test (11/02/16): Fair  exercise capacity (7 min, 27 sec), though patient did not achieve target heart rate.  No arrhythmias or ischemic EKG changes at heart rate achieved.  TTE (07/19/16): Normal LV size with vigorous contraction. LVEF 65-70% with normal wall motion. Normal diastolic function. Mitral annular calcification with mild leaflet thickening noted. Normal RV size and function. Trivial tricuspid regurgitation.  Recent CV Pertinent Labs: Lab Results  Component Value Date   CHOL 218 (H) 03/02/2016   HDL 45 03/02/2016   LDLCALC 145 (H) 03/02/2016   TRIG 141 03/02/2016   CHOLHDL 4.8 03/02/2016   K 3.4 (L) 03/19/2017   MG 2.0 10/19/2016   BUN 9 03/19/2017   BUN 9 10/19/2016   CREATININE 1.05 03/19/2017   CREATININE 0.97 09/21/2016    Past medical and surgical history were reviewed and updated in EPIC.  Current Meds  Medication Sig  . bictegravir-emtricitabine-tenofovir AF (BIKTARVY) 50-200-25 MG TABS tablet Take 1 tablet by mouth daily.  Marland Kitchen dicyclomine (BENTYL) 20 MG tablet Take 20 mg by mouth 3 (three) times daily before meals.  Marland Kitchen omeprazole (PRILOSEC) 20 MG capsule Take 20 mg by mouth daily.  . ondansetron (ZOFRAN) 4 MG tablet Take 1 tablet (4 mg total) by mouth every 8 (eight) hours as needed for nausea or vomiting.    Allergies: Patient has no known allergies.  Social History   Socioeconomic History  . Marital status: Single    Spouse name: Not on file  . Number of children: Not on file  . Years of education: Not on file  . Highest education level: Not on file  Social Needs  . Financial resource strain: Not on file  . Food insecurity - worry: Not on file  . Food insecurity - inability: Not on  file  . Transportation needs - medical: Not on file  . Transportation needs - non-medical: Not on file  Occupational History  . Not on file  Tobacco Use  . Smoking status: Never Smoker  . Smokeless tobacco: Never Used  Substance and Sexual Activity  . Alcohol use: No    Alcohol/week: 0.0 oz    . Drug use: No  . Sexual activity: No    Comment: given condoms  Other Topics Concern  . 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 Headache Mother   . Hypertension Mother   . Heart attack Father 22  . Sudden Cardiac Death Neg Hx     Review of Systems: A 12-system review of systems was performed and was negative except as noted in the HPI.  --------------------------------------------------------------------------------------------------  Physical Exam: BP 140/82   Pulse 96   Ht 5' 8"  (1.727 m)   Wt 222 lb 9.6 oz (101 kg)   SpO2 96%   BMI 33.85 kg/m   General:  Obese man, seated comfortably in the exam room. He is accompanied by a sign interpreter. HEENT: No conjunctival pallor or scleral icterus. Moist mucous membranes.  OP clear. Neck: Supple without lymphadenopathy, thyromegaly, JVD, or HJR. Lungs: Normal work of breathing. Clear to auscultation bilaterally without wheezes or crackles. Heart: Regular rate and rhythm without murmurs, rubs, or gallops. Non-displaced PMI. Abd: Bowel sounds present. Soft, NT/ND without hepatosplenomegaly Ext: No lower extremity edema. Radial, PT, and DP pulses are 2+ bilaterally. Skin: Warm and dry without rash.   Lab Results  Component Value Date   WBC 9.0 03/19/2017   HGB 14.7 03/19/2017   HCT 42.7 03/19/2017   MCV 92.8 03/19/2017   PLT 225 03/19/2017    Lab Results  Component Value Date   NA 137 03/19/2017   K 3.4 (L) 03/19/2017   CL 102 03/19/2017   CO2 22 03/19/2017   BUN 9 03/19/2017   CREATININE 1.05 03/19/2017   GLUCOSE 142 (H) 03/19/2017   ALT 22 03/19/2017    Lab Results  Component Value Date   CHOL 218 (H) 03/02/2016   HDL 45 03/02/2016   LDLCALC 145 (H) 03/02/2016   TRIG 141  03/02/2016   CHOLHDL 4.8 03/02/2016    --------------------------------------------------------------------------------------------------  ASSESSMENT AND PLAN: Chronic dyspnea on exertion and atypical chest pain Symptoms are stable from last visit. Mr. Macken has not been able to modify his diet nor exercise on a regular basis. Given his nondiagnostic exercise tolerance test and persistent symptoms, we have agreed to perform a coronary CTA. In order to optimize CT images and to improve his blood pressure/heart rate, we have agreed to begin metoprolol tartrate 25 mg twice a day.  Palpitations These still remain quite infrequent happening every few months. No worrisome accompany symptoms reported. We will defer further workup unless palpitations increase in frequency or severity.  Obesity Encouraged Mr. Wisenbaker to consider following a Mediterranean diet.  Follow-up: Return to clinic in 6 weeks.  Nelva Bush, MD 04/26/2017 10:33 AM

## 2017-04-27 ENCOUNTER — Encounter: Payer: Self-pay | Admitting: Internal Medicine

## 2017-04-27 DIAGNOSIS — R0789 Other chest pain: Secondary | ICD-10-CM | POA: Insufficient documentation

## 2017-04-27 DIAGNOSIS — E669 Obesity, unspecified: Secondary | ICD-10-CM | POA: Insufficient documentation

## 2017-05-08 ENCOUNTER — Telehealth: Payer: Self-pay | Admitting: *Deleted

## 2017-05-08 ENCOUNTER — Other Ambulatory Visit: Payer: Self-pay | Admitting: Internal Medicine

## 2017-05-08 ENCOUNTER — Other Ambulatory Visit: Payer: Self-pay | Admitting: Podiatry

## 2017-05-08 DIAGNOSIS — B20 Human immunodeficiency virus [HIV] disease: Secondary | ICD-10-CM

## 2017-05-08 DIAGNOSIS — K219 Gastro-esophageal reflux disease without esophagitis: Secondary | ICD-10-CM

## 2017-05-08 MED ORDER — BICTEGRAVIR-EMTRICITAB-TENOFOV 50-200-25 MG PO TABS: 1.0000 | ORAL_TABLET | Freq: Every day | ORAL | 11 refills | Status: DC

## 2017-05-08 NOTE — Telephone Encounter (Signed)
[place] Joycelyn Schmid) and patient left messages in Triage, all confused as to which medication patient takes, where he is getting it. Patient had been trying to refill his old regimen, was upset that there were no refills on those old meds, wanted refills on other medications from an ED visit, AND upset he was told by [facility] that he still needed to finish his paperwork for ADAP renewal. Per office note on 1/29, patient complained about [facility] service, was changed to [employer].  His medications were changed due to SPAP status, now is taking Biktarvy. Per Juliann Pulse, patient has had a partial application since January. She has called him multiple times explaining the need for 4 weeks of pay stubs and a copy of his award letter. During these phone calls, patient walked into [facility] to complete his application with Juliann Pulse.  RN spoke with patient to clarify why he was upset, get clear understanding WHICH pharmacy he really wanted to use.  After much discussion (using [facility] [phone], [place]), patient understands he needs ALL documentation EACH 6 months (January and July) for ADAP renewal.  He decided on 1 pharmacy, [facility], and will coordinate delivery/local pickup with them. Biktarvy cancelled at [employer], called into [employer]. Landis Gandy, RN

## 2017-06-05 ENCOUNTER — Other Ambulatory Visit: Payer: Self-pay | Admitting: Podiatry

## 2017-06-05 ENCOUNTER — Other Ambulatory Visit: Payer: Self-pay | Admitting: Internal Medicine

## 2017-06-05 DIAGNOSIS — B20 Human immunodeficiency virus [HIV] disease: Secondary | ICD-10-CM

## 2017-06-05 DIAGNOSIS — K219 Gastro-esophageal reflux disease without esophagitis: Secondary | ICD-10-CM

## 2017-06-08 ENCOUNTER — Encounter: Payer: Self-pay | Admitting: Physician Assistant

## 2017-06-08 ENCOUNTER — Other Ambulatory Visit: Payer: Self-pay | Admitting: *Deleted

## 2017-06-08 ENCOUNTER — Emergency Department (HOSPITAL_BASED_OUTPATIENT_CLINIC_OR_DEPARTMENT_OTHER)
Admission: EM | Admit: 2017-06-08 | Discharge: 2017-06-09 | Disposition: A | Discharge status: Home / Self Care | Payer: Medicare Other | Attending: Emergency Medicine | Admitting: Emergency Medicine

## 2017-06-08 ENCOUNTER — Other Ambulatory Visit: Payer: Self-pay

## 2017-06-08 ENCOUNTER — Encounter (HOSPITAL_BASED_OUTPATIENT_CLINIC_OR_DEPARTMENT_OTHER): Payer: Self-pay | Admitting: *Deleted

## 2017-06-08 ENCOUNTER — Ambulatory Visit (INDEPENDENT_AMBULATORY_CARE_PROVIDER_SITE_OTHER): Payer: Medicare Other | Admitting: Physician Assistant

## 2017-06-08 VITALS — BP 120/78 | HR 73 | Ht 68.0 in | Wt 219.5 lb

## 2017-06-08 DIAGNOSIS — K625 Hemorrhage of anus and rectum: Secondary | ICD-10-CM

## 2017-06-08 DIAGNOSIS — R072 Precordial pain: Secondary | ICD-10-CM

## 2017-06-08 DIAGNOSIS — R0789 Other chest pain: Secondary | ICD-10-CM

## 2017-06-08 DIAGNOSIS — K649 Unspecified hemorrhoids: Secondary | ICD-10-CM | POA: Diagnosis not present

## 2017-06-08 DIAGNOSIS — Z79899 Other long term (current) drug therapy: Secondary | ICD-10-CM | POA: Insufficient documentation

## 2017-06-08 DIAGNOSIS — R0609 Other forms of dyspnea: Secondary | ICD-10-CM

## 2017-06-08 NOTE — ED Triage Notes (Signed)
Pt is deaf. Through interpreter-pt has had dark red rectal bleeding x 4-5 days. Denies abdominal pain. Reports rectal pain. Denies vomiting.

## 2017-06-08 NOTE — Progress Notes (Signed)
  Cardiology Office Note:    Date:  06/08/2017   ID:  Matthew Fitzpatrick, DOB 09-21-1980, MRN [id number]  PCP:  [name], NP  Cardiologist:  Nelva Bush, MD   Referring MD: [name], NP   Chief Complaint  Patient presents with  . Follow-up   History of Present Illness:    Matthew Fitzpatrick is a 37 y.o. male with dyslipidemia, HIV, congenital deafness.  He was evaluated by Dr. Saunders Revel shortness of breath and palpitations.  Echo in 5/18 demonstrated normal LV function with normal diastolic function.  Exercise tolerance test in 9/18 demonstrated no ischemic changes.  Last seen 04/26/17.  Coronary CTA was recommended.  However, this has not been completed.  Matthew Fitzpatrick returns for follow up.  He is seen with the help of a sign language interpreter.He was never contacted to schedule the CTA.  He denies any chest pain, syncope, paroxysmal nocturnal dyspnea.  His dyspnea on exertion is overall unchanged. Actually, he feels it is somewhat improved.    Physical Exam:    VS:  BP 120/78   Pulse 73   Ht 5' 8"  (1.727 m)   Wt 219 lb 8 oz (99.6 kg)   SpO2 95%   BMI 33.37 kg/m     Wt Readings from Last 3 Encounters:  06/08/17 219 lb 8 oz (99.6 kg)  04/26/17 222 lb 9.6 oz (101 kg)  03/27/17 221 lb (100.2 kg)     Physical Exam  Constitutional: He is oriented to person, place, and time. He appears well-developed and well-nourished.  Cardiovascular: Normal rate and regular rhythm.  Pulmonary/Chest: He has no rales.  Abdominal: Soft.  Musculoskeletal: He exhibits no edema.  Neurological: He is alert and oriented to person, place, and time.  Skin: Skin is warm and dry.    ASSESSMENT & PLAN:    #1.  Dyspnea on exertion He was to have a Coronary CTA prior to follow up today.  However, he was never called to arrange this. He has had no change in his symptoms.  Actually, he feels somewhat better.  Since his CTA has not been done, we will not charge for this visit  today.  I will have him go to the [facility] to schedule his CTA prior to leaving.  Dr. Harrell Gave End or I will see him in follow up after his CTA.   Dispo:  Return in about 1 month (around 07/06/2017) for Follow up after testing, w/ Dr. Saunders Revel, or Richardson Dopp, PA-C.   Medication Adjustments/Labs and Tests Ordered: Current medicines are reviewed at length with the patient today.  Concerns regarding medicines are outlined above.  Tests Ordered: No orders of the defined types were placed in this encounter.  Medication Changes: No orders of the defined types were placed in this encounter.   Signed, Richardson Dopp, PA-C  06/08/2017 10:06 AM    [facility] [place], [place], [place]  [address] Phone: [phone]; Fax: [phone]

## 2017-06-08 NOTE — Patient Instructions (Signed)
Medication Instructions:  1. Your physician recommends that you continue on your current medications as directed. Please refer to the Current Medication list given to you today.   Labwork: NONE ORDERED TODAY  Testing/Procedures: NONE ORDERED TODAY  Follow-Up: 2-3 WEEKS WITH SCOTT WEAVER, PAC OR DR. END AFTER CORONARY CTA HAS BEEN DONE  Any Other Special Instructions Will Be Listed Below (If Applicable).     If you need a refill on your cardiac medications before your next appointment, please call your pharmacy.

## 2017-06-09 LAB — CBC WITH DIFFERENTIAL/PLATELET
BASOS ABS: 0 10*3/uL (ref 0.0–0.1)
Basophils Relative: 0 %
EOS ABS: 0.1 10*3/uL (ref 0.0–0.7)
Eosinophils Relative: 2 %
HCT: 41.3 % (ref 39.0–52.0)
Hemoglobin: 14.4 g/dL (ref 13.0–17.0)
LYMPHS ABS: 2.9 10*3/uL (ref 0.7–4.0)
Lymphocytes Relative: 41 %
MCH: 33 pg (ref 26.0–34.0)
MCHC: 34.9 g/dL (ref 30.0–36.0)
MCV: 94.7 fL (ref 78.0–100.0)
MONO ABS: 1.1 10*3/uL — AB (ref 0.1–1.0)
Monocytes Relative: 16 %
NEUTROS ABS: 3 10*3/uL (ref 1.7–7.7)
Neutrophils Relative %: 41 %
PLATELETS: 214 10*3/uL (ref 150–400)
RBC: 4.36 MIL/uL (ref 4.22–5.81)
RDW: 11.9 % (ref 11.5–15.5)
WBC: 7.1 10*3/uL (ref 4.0–10.5)

## 2017-06-09 LAB — COMPREHENSIVE METABOLIC PANEL
ALBUMIN: 3.8 g/dL (ref 3.5–5.0)
ALT: 23 U/L (ref 17–63)
AST: 24 U/L (ref 15–41)
Alkaline Phosphatase: 89 U/L (ref 38–126)
Anion gap: 8 (ref 5–15)
BILIRUBIN TOTAL: 0.5 mg/dL (ref 0.3–1.2)
BUN: 15 mg/dL (ref 6–20)
CALCIUM: 8.6 mg/dL — AB (ref 8.9–10.3)
CO2: 24 mmol/L (ref 22–32)
Chloride: 102 mmol/L (ref 101–111)
Creatinine, Ser: 1.01 mg/dL (ref 0.61–1.24)
GFR calc Af Amer: >60 mL/min (ref >60)
GLUCOSE: 121 mg/dL — AB (ref 65–99)
POTASSIUM: 3.6 mmol/L (ref 3.5–5.1)
Sodium: 134 mmol/L — ABNORMAL LOW (ref 135–145)
TOTAL PROTEIN: 7.6 g/dL (ref 6.5–8.1)

## 2017-06-09 MED ORDER — SODIUM CHLORIDE 0.9 % IV BOLUS
1000.0000 mL | Freq: Once | INTRAVENOUS | Status: AC
  Administered 2017-06-09: 1000 mL via INTRAVENOUS

## 2017-06-09 MED ORDER — HYDROCORTISONE 1 % RE CREA: TOPICAL_CREAM | RECTAL | 0 refills | Status: DC

## 2017-06-09 NOTE — ED Provider Notes (Signed)
[facility] EMERGENCY DEPARTMENT Provider Note   CSN: [id number] Arrival date & time: 06/08/17  2311     History   Chief Complaint Chief Complaint  Patient presents with  . Rectal Bleeding    HPI Matthew Fitzpatrick is a 37 y.o. male.  Patient is a 37 year old male with a history of congenital deafness, HIV and reflux who presents with rectal bleeding.  He states over the last 5 days he has had problems with rectal bleeding.  He states several times a day he has bright red blood per rectum.  He has some associated rectal pain.  No abdominal pain.  No nausea or vomiting.  He feels fatigued but no dizziness.  No shortness of breath.  He does say that it started after he had anal intercourse and had used an enema to clean out his bowels.  He denies any history of similar symptoms in the past.  He does not have a gastroenterologist.  He has not been using medications at home for the symptoms other than alcohol wipes to the area which has not been helping.  History was obtained through the video language line using American sign language interpreter.     Past Medical History:  Diagnosis Date  . Acute conjunctivitis 02/12/2014  . Acute gastroenteritis 08/12/2015  . ANXIETY 03/02/2006   Qualifier: Diagnosis of  By: [name] MD, [name]    . Bronchitis 01/26/2011  . CAP (community acquired pneumonia)   . CHICKENPOX, HX OF 06/20/2006   Annotation: age 23 Qualifier: Diagnosis of  By: [name] MD, Jenny Reichmann    . COUGH, CHRONIC 12/23/2008   Qualifier: Diagnosis of  By: [name] MD, [name]    . Deaf   . 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 DEPRESSION 03/02/2006   Qualifier: Diagnosis of  By: [name] MD, [name]    . Dyslipidemia 07/02/2012  . 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 FACIAL RASH 11/17/2008   Qualifier: Diagnosis of  By: [name] MD, [name]    . GERD 07/20/2009   Qualifier:  Diagnosis of  By: [name] MD, [name]    . GERD (gastroesophageal reflux disease)   . HIV disease ([place])   . Obesity (BMI 30.0-34.9) 04/20/2016  . PEDICULOSIS PUBIS (PUBIC LOUSE) 06/20/2006   Annotation: 4/06 Qualifier: Diagnosis of  By: [name] MD, [name]    . Poor dentition 09/21/2016  . 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 Pulmonary nodule   . SINUSITIS, CHRONIC 02/18/2009   Qualifier: Diagnosis of  By: Tommy Medal MD, Roderic Scarce    . Urinary frequency 04/06/2009   Qualifier: Diagnosis of  By: [name] MD, [name]      Patient Active Problem List   Diagnosis Date Noted  . Atypical chest pain 04/27/2017  . Obesity (BMI 30-39.9) 04/27/2017  . Abdominal pain 03/27/2017  . Palpitations 10/19/2016  . Dyspnea on exertion 10/19/2016  . Poor dentition 09/21/2016  . Pulmonary nodule 08/17/2016  . Obesity (BMI 30.0-34.9) 04/20/2016  . Diarrhea 07/02/2012  . Dyslipidemia 07/02/2012  . GERD (gastroesophageal reflux disease) 06/28/2010  . 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 Human immunodeficiency virus (HIV) disease ([place]) 03/02/2006  . ANXIETY 03/02/2006  . DEPRESSION 03/02/2006  . ALLERGIC RHINITIS 03/02/2006    History reviewed. No pertinent surgical  history.      Home Medications    Prior to Admission medications   Medication Sig Start Date End Date Taking? Authorizing Provider  bictegravir-emtricitabine-tenofovir AF (BIKTARVY) 50-200-25 MG TABS tablet Take 1 tablet by mouth daily. 05/08/17   Michel Bickers, MD  hydrocortisone (PROCTO-PAK) 1 % CREA Apply to hemorrhoid twice daily 06/09/17   Malvin Johns, MD  meloxicam (MOBIC) 15 MG tablet TAKE 1 TABLET(15 MG) BY MOUTH DAILY 05/08/17   Hyatt, Max T, DPM  omeprazole (PRILOSEC) 20 MG capsule Take 20 mg by mouth daily.    [provider]    Family History Family History  Problem Relation Age of Onset  . Headache Mother    . Hypertension Mother   . Heart attack Father 91  . Sudden Cardiac Death Neg Hx     Social History Social History   Tobacco Use  . Smoking status: Never Smoker  . Smokeless tobacco: Never Used  Substance Use Topics  . Alcohol use: No    Alcohol/week: 0.0 oz  . Drug use: No     Allergies   Patient has no known allergies.   Review of Systems Review of Systems  Constitutional: Positive for fatigue. Negative for chills, diaphoresis and fever.  HENT: Negative for congestion, rhinorrhea and sneezing.   Eyes: Negative.   Respiratory: Negative for cough, chest tightness and shortness of breath.   Cardiovascular: Negative for chest pain and leg swelling.  Gastrointestinal: Positive for anal bleeding and rectal pain. Negative for abdominal pain, diarrhea, nausea and vomiting.  Genitourinary: Negative for difficulty urinating, flank pain, frequency and hematuria.  Musculoskeletal: Negative for arthralgias and back pain.  Skin: Negative for rash.  Neurological: Negative for dizziness, speech difficulty, weakness, numbness and headaches.     Physical Exam Updated Vital Signs BP (!) 136/101 (BP Location: Left Arm)   Pulse 87   Temp 99 F (37.2 C) (Oral)   Resp 18   SpO2 100%   Physical Exam  Constitutional: He is oriented to person, place, and time. He appears well-developed and well-nourished.  HENT:  Head: Normocephalic and atraumatic.  Eyes: Pupils are equal, round, and reactive to light.  Neck: Normal range of motion. Neck supple.  Cardiovascular: Normal rate, regular rhythm and normal heart sounds.  Pulmonary/Chest: Effort normal and breath sounds normal. No respiratory distress. He has no wheezes. He has no rales. He exhibits no tenderness.  Abdominal: Soft. Bowel sounds are normal. There is no tenderness. There is no rebound and no guarding.  Genitourinary:  Genitourinary Comments: Patient has a small, nonthrombosed external hemorrhoid which has evidence of recent  bleeding.  There is no current active bleeding.  There is some blood in the rectum.  I do not visualize any tears or fissures.  There is no swelling or pain on internal rectal exam.  Musculoskeletal: Normal range of motion. He exhibits no edema.  Lymphadenopathy:    He has no cervical adenopathy.  Neurological: He is alert and oriented to person, place, and time.  Skin: Skin is warm and dry. No rash noted.  Psychiatric: He has a normal mood and affect.     ED Treatments / Results  Labs (all labs ordered are listed, but only abnormal results are displayed) Labs Reviewed  COMPREHENSIVE METABOLIC PANEL - Abnormal; Notable for the following components:      Result Value   Sodium 134 (*)    Glucose, Bld 121 (*)    Calcium 8.6 (*)    All other components  within normal limits  CBC WITH DIFFERENTIAL/PLATELET - Abnormal; Notable for the following components:   Monocytes Absolute 1.1 (*)    All other components within normal limits    EKG None  Radiology No results found.  Procedures Procedures (including critical care time)  Medications Ordered in ED Medications  sodium chloride 0.9 % bolus 1,000 mL (1,000 mLs Intravenous New Bag/Given 06/09/17 0007)     Initial Impression / Assessment and Plan / ED Course  I have reviewed the triage vital signs and the nursing notes.  Pertinent labs & imaging results that were available during my care of the patient were reviewed by me and considered in my medical decision making (see chart for details).     Patient is a 37 year old male who presents with rectal bleeding.  He has a mildly swollen tender hemorrhoid which appears to be the source of bleeding.  There is suggestion since recent bleeding from the hemorrhoid with a small scab on it.  There is no active bleeding.  His hemoglobin is normal.  He has no hypertension or other significant symptoms.  He has no associated abdominal pain which would warrant imaging of his abdomen.  He was  discharged home in good condition.  He was given a prescription for Anusol cream.  He was advised to follow-up with his PCP within the next few days for recheck to make sure that the bleeding has resolved.  He was also given a referral to follow-up with gastroenterology if his symptoms continue.  Return precautions were given.  Discharge instructions were explained to him through the video language line with an American sign language interpreter.  Final Clinical Impressions(s) / ED Diagnoses   Final diagnoses:  Rectal bleeding  Hemorrhoids, unspecified hemorrhoid type    ED Discharge Orders        Ordered    hydrocortisone (PROCTO-PAK) 1 % CREA     06/09/17 0055       Malvin Johns, MD 06/09/17 [phone]

## 2017-06-13 ENCOUNTER — Telehealth: Payer: Self-pay | Admitting: Behavioral Health

## 2017-06-13 NOTE — Telephone Encounter (Signed)
Patient called stating he has not received his shipment of [place].  Patient is concerned and wants to know what is going on.    Writer called [employer] to find out what happened and why patient's medications were not shipped.  Pharmacy staff stated patient insurance lasped and has a copay that needs to be paid.  She states they have a medication assistance program that they will try to enroll the patient in they will and they will call the patient to enroll him.  Patient has an appointment tomorrow 06/14/2017 with Juliann Pulse and Caryl Pina in Financial counseling.  Called patient back to let him know. Pricilla Riffle RN

## 2017-06-14 ENCOUNTER — Ambulatory Visit: Payer: Medicare Other

## 2017-07-03 ENCOUNTER — Ambulatory Visit (HOSPITAL_COMMUNITY)
Admission: RE | Admit: 2017-07-03 | Discharge: 2017-07-03 | Disposition: A | Discharge status: Home / Self Care | Payer: Medicare Other | Source: Ambulatory Visit | Attending: Internal Medicine | Admitting: Internal Medicine

## 2017-07-03 ENCOUNTER — Ambulatory Visit (HOSPITAL_COMMUNITY): Admission: RE | Admit: 2017-07-03 | Payer: Medicare Other | Source: Ambulatory Visit

## 2017-07-03 DIAGNOSIS — R072 Precordial pain: Secondary | ICD-10-CM | POA: Diagnosis not present

## 2017-07-03 DIAGNOSIS — R079 Chest pain, unspecified: Secondary | ICD-10-CM | POA: Diagnosis not present

## 2017-07-03 DIAGNOSIS — R911 Solitary pulmonary nodule: Secondary | ICD-10-CM | POA: Diagnosis not present

## 2017-07-03 MED ORDER — NITROGLYCERIN 0.4 MG SL SUBL
SUBLINGUAL_TABLET | SUBLINGUAL | Status: AC
  Administered 2017-07-03: 0.8 mg via SUBLINGUAL
  Filled 2017-07-03: qty 2

## 2017-07-03 MED ORDER — METOPROLOL TARTRATE 5 MG/5ML IV SOLN
INTRAVENOUS | Status: AC
  Administered 2017-07-03: 5 mg via INTRAVENOUS
  Filled 2017-07-03: qty 20

## 2017-07-03 MED ORDER — NITROGLYCERIN 0.4 MG SL SUBL
0.8000 mg | SUBLINGUAL_TABLET | Freq: Once | SUBLINGUAL | Status: AC
  Administered 2017-07-03: 0.8 mg via SUBLINGUAL
  Filled 2017-07-03: qty 25

## 2017-07-03 MED ORDER — IOPAMIDOL (ISOVUE-370) INJECTION 76%
INTRAVENOUS | Status: AC
  Filled 2017-07-03: qty 100

## 2017-07-03 MED ORDER — METOPROLOL TARTRATE 5 MG/5ML IV SOLN
5.0000 mg | INTRAVENOUS | Status: DC | PRN
  Administered 2017-07-03 (×3): 5 mg via INTRAVENOUS
  Filled 2017-07-03 (×4): qty 5

## 2017-07-03 MED ORDER — IOPAMIDOL (ISOVUE-370) INJECTION 76%
100.0000 mL | Freq: Once | INTRAVENOUS | Status: AC | PRN
  Administered 2017-07-03: 80 mL via INTRAVENOUS

## 2017-07-04 DIAGNOSIS — R197 Diarrhea, unspecified: Secondary | ICD-10-CM | POA: Diagnosis not present

## 2017-07-04 DIAGNOSIS — K602 Anal fissure, unspecified: Secondary | ICD-10-CM | POA: Diagnosis not present

## 2017-07-04 DIAGNOSIS — K625 Hemorrhage of anus and rectum: Secondary | ICD-10-CM | POA: Diagnosis not present

## 2017-07-20 ENCOUNTER — Encounter: Payer: Self-pay | Admitting: Physician Assistant

## 2017-07-20 ENCOUNTER — Ambulatory Visit (INDEPENDENT_AMBULATORY_CARE_PROVIDER_SITE_OTHER): Payer: Medicare Other | Admitting: Physician Assistant

## 2017-07-20 VITALS — BP 120/78 | HR 103 | Ht 68.0 in | Wt 224.1 lb

## 2017-07-20 DIAGNOSIS — R0609 Other forms of dyspnea: Secondary | ICD-10-CM

## 2017-07-20 DIAGNOSIS — R911 Solitary pulmonary nodule: Secondary | ICD-10-CM

## 2017-07-20 NOTE — Progress Notes (Signed)
Cardiology Office Note:    Date:  07/20/2017   ID:  Matthew Fitzpatrick, DOB 09-06-1980, MRN [id number]  PCP:  [name], NP  Cardiologist:  Nelva Bush, MD   Referring MD: [name], NP   Chief Complaint  Patient presents with  . Follow-up    review Coronary CTA results    History of Present Illness:    Matthew Fitzpatrick is a 37 y.o. male with dyslipidemia, HIV, congenital deafness.  He was evaluated by Dr. Saunders Revel for shortness of breath and palpitations.  Echo in 5/18 demonstrated normal LV function with normal diastolic function.  Exercise tolerance test in 9/18 demonstrated no ischemic changes.  He ultimately underwent coronary CTA.  This was done 07/03/2017 and demonstrated a calcium score of 0 and normal coronary arteries.  There was an incidental right upper lobe pulmonary nodule noted measuring 4 mm.  Matthew Fitzpatrick returns for follow up.  He is seen with the help of an interpreter.  He denies chest pain, shortness of breath, syncope.  He denies a hx of smoking, asbestos exposure or FHx of lung cancer.    Prior CV studies:   The following studies were reviewed today:  Coronary CTA 07/03/2017 IMPRESSION: 1.  Calcium score 0 2.  Normal right dominant coronary arteries 3.  Normal aortic root 3.2 cm NON-CARDIAC FINDINGS: 1. Small pulmonary nodule in the right upper lobe measures 4 mm. No follow-up needed if patient is low-risk. Non-contrast chest CT can be considered in 12 months if patient is high-risk. This recommendation follows the consensus statement: Guidelines for Management of Incidental Pulmonary Nodules Detected on CT Images: From the Fleischner Society 2017; Radiology 2017; 284:228-243.  GXT 11/02/2016  Blood pressure demonstrated a normal response to exercise.  There was no ST segment deviation noted during stress.  Clinically and electrically negative for ischemia  Echo 07/19/2016 EF 65-70, normal wall motion, MAC, trivial TR  Past  Medical History:  Diagnosis Date  . Acute conjunctivitis 02/12/2014  . Acute gastroenteritis 08/12/2015  . ANXIETY 03/02/2006   Qualifier: Diagnosis of  By: [name] MD, [name]    . Bronchitis 01/26/2011  . CAP (community acquired pneumonia)   . CHICKENPOX, HX OF 06/20/2006   Annotation: age 47 Qualifier: Diagnosis of  By: [name] MD, Jenny Reichmann    . COUGH, CHRONIC 12/23/2008   Qualifier: Diagnosis of  By: [name] MD, [name]    . Deaf   . 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 DEPRESSION 03/02/2006   Qualifier: Diagnosis of  By: [name] MD, [name]    . Dyslipidemia 07/02/2012  . 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 FACIAL RASH 11/17/2008   Qualifier: Diagnosis of  By: [name] MD, [name]    . GERD 07/20/2009   Qualifier: Diagnosis of  By: [name] MD, [name]    . GERD (gastroesophageal reflux disease)   . HIV disease ([place])   . Obesity (BMI 30.0-34.9) 04/20/2016  . PEDICULOSIS PUBIS (PUBIC LOUSE) 06/20/2006   Annotation: 4/06 Qualifier: Diagnosis of  By: [name] MD, [name]    . Poor dentition 09/21/2016  . 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 Pulmonary nodule   . SINUSITIS, CHRONIC 02/18/2009   Qualifier: Diagnosis of  By: Tommy Medal MD, Roderic Scarce    . Urinary frequency 04/06/2009  Qualifier: Diagnosis of  By: [name] MD, [name]     Surgical Hx: The patient  has no past surgical history on file.   Current Medications: Current Meds  Medication Sig  . bictegravir-emtricitabine-tenofovir AF (BIKTARVY) 50-200-25 MG TABS tablet Take 1 tablet by mouth daily.     Allergies:   Patient has no known allergies.   Social History   Tobacco Use  . Smoking status: Never Smoker  . Smokeless tobacco: Never Used  Substance Use Topics  . Alcohol use: No    Alcohol/week: 0.0 oz  . Drug use: No     Family Hx: The patient's family history  includes Headache in his mother; Heart attack (age of onset: 65) in his father; Hypertension in his mother. There is no history of Sudden Cardiac Death.  ROS:   Please see the history of present illness.    ROS All other systems reviewed and are negative.   EKGs/Labs/Other Test Reviewed:    EKG:  EKG is not ordered today.    Recent Labs: 10/19/2016: Magnesium 2.0; TSH 1.570 06/09/2017: ALT 23; BUN 15; Creatinine, Ser 1.01; Hemoglobin 14.4; Platelets 214; Potassium 3.6; Sodium 134   Recent Lipid Panel Lab Results  Component Value Date/Time   CHOL 218 (H) 03/02/2016 04:28 PM   TRIG 141 03/02/2016 04:28 PM   HDL 45 03/02/2016 04:28 PM   CHOLHDL 4.8 03/02/2016 04:28 PM   LDLCALC 145 (H) 03/02/2016 04:28 PM    Physical Exam:    VS:  BP 120/78   Pulse (!) 103   Ht 5' 8"  (1.727 m)   Wt 224 lb 1.9 oz (101.7 kg)   SpO2 94%   BMI 34.08 kg/m     Wt Readings from Last 3 Encounters:  07/20/17 224 lb 1.9 oz (101.7 kg)  06/08/17 219 lb 8 oz (99.6 kg)  04/26/17 222 lb 9.6 oz (101 kg)     Physical Exam  Constitutional: He is oriented to person, place, and time. He appears well-developed and well-nourished. No distress.  HENT:  Head: Normocephalic and atraumatic.  Neck: Neck supple.  Cardiovascular: Normal rate, regular rhythm, S1 normal and S2 normal.  No murmur heard. Pulmonary/Chest: Breath sounds normal. He has no rales.  Abdominal: Soft.  Musculoskeletal: He exhibits no edema.  Neurological: He is alert and oriented to person, place, and time.  Skin: Skin is warm and dry.    ASSESSMENT & PLAN:    Dyspnea on exertion No further symptoms.  His CTA was neg for CAD and Ca score was 0.  No further ischemic testing needed.  Lung nodule 4 mm RUL nodule.  Radiology recommendations are for no follow up CT if low risk.  He has no hx of smoking or exposure to harmful substances or FHx of lung CA.  At this point, he does not seem to need a repeat CT.   Dispo:  Return in about 1  year (around 07/21/2018) for Routine Follow Up, w/ Dr. Saunders Revel, or Richardson Dopp, PA-C.   Medication Adjustments/Labs and Tests Ordered: Current medicines are reviewed at length with the patient today.  Concerns regarding medicines are outlined above.  Tests Ordered: No orders of the defined types were placed in this encounter.  Medication Changes: No orders of the defined types were placed in this encounter.   Signed, Richardson Dopp, PA-C  07/20/2017 9:51 AM    [facility] [address], [place], [place]  [address] Phone: [phone]; Fax: [phone]

## 2017-07-20 NOTE — Patient Instructions (Signed)
Medication Instructions:  No changes  Labwork: None   Testing/Procedures: None   Follow-Up: Nelva Bush, MD or Richardson Dopp, PA-C in 1 year.   Any Other Special Instructions Will Be Listed Below (If Applicable).  If you need a refill on your cardiac medications before your next appointment, please call your pharmacy.

## 2017-08-06 ENCOUNTER — Telehealth: Payer: Self-pay | Admitting: *Deleted

## 2017-08-06 NOTE — Telephone Encounter (Signed)
Patient called with interpreter to see if he could take [place] with Biktarvy.  Per Cassie, there is an interaction (decreases absorption of Biktarvy). RN counseled patient to avoid unhealthy dietary choices, increase exercise to see if that helps him lose the weight he desires. Patient verbalized understanding. Landis Gandy, RN

## 2017-08-08 NOTE — Progress Notes (Signed)
Subjective:   Matthew Fitzpatrick is a 37 y.o. male who presents for Medicare Annual/Subsequent preventive examination.   Pt states he has been having diarrhea on and off since April. Pt reports he can sometimes see blood in stool. Pt has appointment with GI on Wednesday of this week, but states he would like to see a doctor today. Pt placed on schedule to see Mackie Pai, PA directly following this visit.   Review of Systems: No ROS.  Medicare Wellness Visit. Additional risk factors are reflected in the social history. Cardiac Risk Factors include: advanced age (>46mn, >>73women) Sleep patterns: Sleeps 7-9 hrs. Feels tired. Home Safety/Smoke Alarms: Feels safe in home. Smoke alarms in place.  Living environment; residence and FAdult nurse lives alone in apt.  Male:      PSA- No results found for: PSA      Objective:    Vitals: BP 122/80 (BP Location: Left Arm, Patient Position: Sitting, Cuff Size: Normal)   Pulse 91   Ht 5' 8"  (1.727 m)   Wt 220 lb 12.8 oz (100.2 kg)   SpO2 97%   BMI 33.57 kg/m   Body mass index is 33.57 kg/m.  Advanced Directives 08/13/2017 03/19/2017 12/22/2016 08/11/2016 03/03/2015  Does Patient Have a Medical Advance Directive? No No No No No  Would patient like information on creating a medical advance directive? No - Patient declined Yes (ED - Information included in AVS) Yes (ED - Information included in AVS) No - Patient declined No - patient declined information    Tobacco Social History   Tobacco Use  Smoking Status Never Smoker  Smokeless Tobacco Never Used     Counseling given: Not Answered   Clinical Intake:     Pain : 0-10 Pain Score: 3  Pain Type: Chronic pain Pain Location: Abdomen Pain Descriptors / Indicators: Constant Pain Onset: More than a month ago Pain Frequency: Intermittent Pain Relieving Factors: none  Pain Relieving Factors: none              Past Medical History:  Diagnosis Date  . Acute  conjunctivitis 02/12/2014  . Acute gastroenteritis 08/12/2015  . ANXIETY 03/02/2006   Qualifier: Diagnosis of  By: CMegan SalonMD, [name]    . Bronchitis 01/26/2011  . CAP (community acquired pneumonia)   . CHICKENPOX, HX OF 06/20/2006   Annotation: age [age]Qualifier: Diagnosis of  By: CMegan SalonMD, JJenny Reichmann   . COUGH, CHRONIC 12/23/2008   Qualifier: Diagnosis of  By: CMegan SalonMD, [name]    . Deaf   . 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 DEPRESSION 03/02/2006   Qualifier: Diagnosis of  By: CMegan SalonMD, [name]    . Dyslipidemia 07/02/2012  . 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 FACIAL RASH 11/17/2008   Qualifier: Diagnosis of  By: CMegan SalonMD, [name]    . GERD 07/20/2009   Qualifier: Diagnosis of  By: CMegan SalonMD, [name]    . GERD (gastroesophageal reflux disease)   . [place]   . Obesity (BMI 30.0-34.9) 04/20/2016  . PEDICULOSIS PUBIS (PUBIC LOUSE) 06/20/2006   Annotation: 4/06 Qualifier: Diagnosis of  By: CMegan SalonMD, [name]    . Poor dentition 09/21/2016  . 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 Pulmonary nodule   . SINUSITIS,  CHRONIC 02/18/2009   Qualifier: Diagnosis of  By: Tommy Medal MD, Roderic Scarce    . Urinary frequency 04/06/2009   Qualifier: Diagnosis of  By: [name] MD, [name]     History reviewed. No pertinent surgical history. Family History  Problem Relation Age of Onset  . Headache Mother   . Hypertension Mother   . Heart attack Father 36  . Sudden Cardiac Death Neg Hx    Social History   Socioeconomic History  . Marital status: Single    Spouse name: Not on file  . Number of children: Not on file  . Years of education: Not on file  . Highest education level: Not on file  Occupational History  . Not on file  Social Needs  . Financial resource strain: Not on file  . Food insecurity:    Worry: Not on file     Inability: Not on file  . Transportation needs:    Medical: Not on file    Non-medical: Not on file  Tobacco Use  . Smoking status: Never Smoker  . Smokeless tobacco: Never Used  Substance and Sexual Activity  . Alcohol use: No    Alcohol/week: 0.0 oz  . Drug use: No  . Sexual activity: Never    Comment: given condoms  Lifestyle  . Physical activity:    Days per week: Not on file    Minutes per session: Not on file  . Stress: Not on file  Relationships  . Social connections:    Talks on phone: Not on file    Gets together: Not on file    Attends religious service: Not on file    Active member of club or organization: Not on file    Attends meetings of clubs or organizations: Not on file    Relationship status: Not on file  Other Topics Concern  . 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 bictegravir-emtricitabine-tenofovir AF (BIKTARVY) 50-200-25 MG TABS tablet Take 1 tablet by mouth daily.  . [DISCONTINUED] meloxicam (MOBIC) 15 MG tablet TAKE 1 TABLET(15 MG) BY MOUTH DAILY (Patient not taking: Reported on 07/20/2017)  . [DISCONTINUED] omeprazole (PRILOSEC) 20 MG capsule Take 20 mg by mouth daily.   No facility-administered encounter medications on file as of 08/13/2017.     Activities of Daily Living In your present state of health, do you have any difficulty performing the following activities: 08/13/2017  Hearing? Y  Comment deaf  Vision? N  Difficulty concentrating or making decisions? N  Walking or climbing stairs? N  Dressing or bathing? N  Doing errands, shopping? N  Preparing Food and eating ? N  Using the Toilet? N  In the past six months, have you accidently leaked urine? Y  Do you have problems with loss of  bowel control? Y  Managing your Medications? N  Managing your Finances? N  Housekeeping or managing your Housekeeping? N  Some recent data might be hidden    Patient Care Team: [name], NP as PCP - General (Internal Medicine) Michel Bickers, MD as PCP - Infectious Diseases (Infectious Diseases) End, Harrell Gave, MD as PCP - Cardiology (Cardiology)   Assessment:   This is a routine wellness examination for  Matthew Fitzpatrick. Physical assessment deferred to PCP.  Exercise Activities and Dietary recommendations Current Exercise Habits: The patient does not participate in regular exercise at present, Exercise limited by: None identified   Diet (meal preparation, eat out, water intake, caffeinated beverages, dairy products, fruits and vegetables): in general, a "healthy" diet  , well balanced   Goals    . Get stomach issue resolved       Fall Risk Fall Risk  08/13/2017 03/27/2017 09/21/2016 08/11/2016 04/20/2016  Falls in the past year? No No No No No    Depression Screen PHQ 2/9 Scores 08/13/2017 03/27/2017 09/21/2016 08/11/2016  PHQ - 2 Score 1 0 0 0    Cognitive Function Ad8 score reviewed for issues:  Issues making decisions:no  Less interest in hobbies / activities:no  Repeats questions, stories (family complaining):no  Trouble using ordinary gadgets (microwave, computer, phone):no  Forgets the month or year: no  Mismanaging finances: no  Remembering appts:no  Daily problems with thinking and/or memory:no Ad8 score is=0        Immunization History  Administered Date(s) Administered  . Hepatitis B 04/28/2003, 06/10/2003, 07/30/2003  . Influenza Split 01/26/2011, 02/15/2012  . Influenza Whole 03/26/2006, 12/25/2006, 12/23/2008, 11/02/2009  . Influenza,inj,Quad PF,6+ Mos 02/12/2014, 03/16/2015, 04/20/2016  . Pneumococcal Polysaccharide-23 04/28/2003, 11/02/2009    Screening Tests Health Maintenance  Topic Date Due  . TETANUS/TDAP  10/26/1998  . INFLUENZA  VACCINE  09/27/2017  . HIV Screening  Completed      Plan:    Pt states he has been having diarrhea on and off since April. Pt reports he can sometimes see blood in stool. Pt has appointment with GI on Wednesday of this week, but states he would like to see a doctor today. Pt placed on schedule to see Mackie Pai, PA directly following this visit.   Please schedule your next medicare wellness visit with me in 1 yr.  Continue to eat heart healthy diet (full of fruits, vegetables, whole grains, lean protein, water--limit salt, fat, and sugar intake) and increase physical activity as tolerated.  Continue doing brain stimulating activities (puzzles, reading, adult coloring books, staying active) to keep memory sharp.    I have personally reviewed and noted the following in the patient's chart:   . Medical and social history . Use of alcohol, tobacco or illicit drugs  . Current medications and supplements . Functional ability and status . Nutritional status . Physical activity . Advanced directives . List of other physicians . Hospitalizations, surgeries, and ER visits in previous 12 months . Vitals . Screenings to include cognitive, depression, and falls . Referrals and appointments  In addition, I have reviewed and discussed with patient certain preventive protocols, quality metrics, and best practice recommendations. A written personalized care plan for preventive services as well as general preventive health recommendations were provided to patient.     Shela Nevin, [place]  08/13/2017

## 2017-08-13 ENCOUNTER — Ambulatory Visit: Payer: Medicare Other | Admitting: Medical

## 2017-08-13 ENCOUNTER — Encounter: Payer: Self-pay | Admitting: Medical

## 2017-08-13 ENCOUNTER — Ambulatory Visit (INDEPENDENT_AMBULATORY_CARE_PROVIDER_SITE_OTHER): Payer: Medicare Other | Admitting: Medical

## 2017-08-13 ENCOUNTER — Ambulatory Visit (INDEPENDENT_AMBULATORY_CARE_PROVIDER_SITE_OTHER): Payer: Medicare Other | Admitting: *Deleted

## 2017-08-13 ENCOUNTER — Encounter: Payer: Self-pay | Admitting: *Deleted

## 2017-08-13 VITALS — BP 122/80 | HR 91 | Ht 68.0 in | Wt 220.0 lb

## 2017-08-13 VITALS — BP 122/80 | HR 91 | Ht 68.0 in | Wt 220.8 lb

## 2017-08-13 DIAGNOSIS — R3911 Hesitancy of micturition: Secondary | ICD-10-CM

## 2017-08-13 DIAGNOSIS — Z0000 Encounter for general adult medical examination without abnormal findings: Secondary | ICD-10-CM | POA: Diagnosis not present

## 2017-08-13 DIAGNOSIS — R109 Unspecified abdominal pain: Secondary | ICD-10-CM | POA: Diagnosis not present

## 2017-08-13 DIAGNOSIS — R197 Diarrhea, unspecified: Secondary | ICD-10-CM | POA: Diagnosis not present

## 2017-08-13 LAB — POC URINALSYSI DIPSTICK (AUTOMATED)
BILIRUBIN UA: NEGATIVE
Glucose, UA: NEGATIVE
KETONES UA: NEGATIVE
Leukocytes, UA: NEGATIVE
NITRITE UA: NEGATIVE
Protein, UA: POSITIVE — AB
RBC UA: NEGATIVE
Urobilinogen, UA: 2 E.U./dL — AB
pH, UA: 6 (ref 5.0–8.0)

## 2017-08-13 LAB — COMPREHENSIVE METABOLIC PANEL
ALBUMIN: 3.9 g/dL (ref 3.5–5.2)
ALK PHOS: 134 U/L — AB (ref 39–117)
ALT: 43 U/L (ref 0–53)
AST: 41 U/L — ABNORMAL HIGH (ref 0–37)
BILIRUBIN TOTAL: 0.5 mg/dL (ref 0.2–1.2)
BUN: 12 mg/dL (ref 6–23)
CO2: 30 mEq/L (ref 19–32)
CREATININE: 0.96 mg/dL (ref 0.40–1.50)
Calcium: 9.3 mg/dL (ref 8.4–10.5)
Chloride: 101 mEq/L (ref 96–112)
GFR: 112.86 mL/min (ref >60.00)
GLUCOSE: 85 mg/dL (ref 70–99)
POTASSIUM: 4.3 meq/L (ref 3.5–5.1)
SODIUM: 137 meq/L (ref 135–145)
TOTAL PROTEIN: 8.5 g/dL — AB (ref 6.0–8.3)

## 2017-08-13 LAB — CBC WITH DIFFERENTIAL/PLATELET
BASOS ABS: 0.1 10*3/uL (ref 0.0–0.1)
Basophils Relative: 1.4 % (ref 0.0–3.0)
EOS PCT: 1.5 % (ref 0.0–5.0)
Eosinophils Absolute: 0.1 10*3/uL (ref 0.0–0.7)
HCT: 39 % (ref 39.0–52.0)
HEMOGLOBIN: 13.1 g/dL (ref 13.0–17.0)
Lymphocytes Relative: 44.7 % (ref 12.0–46.0)
Lymphs Abs: 2.7 10*3/uL (ref 0.7–4.0)
MCHC: 33.4 g/dL (ref 30.0–36.0)
MCV: 90.5 fl (ref 78.0–100.0)
MONO ABS: 1.1 10*3/uL — AB (ref 0.1–1.0)
Monocytes Relative: 18 % — ABNORMAL HIGH (ref 3.0–12.0)
NEUTROS PCT: 34.4 % — AB (ref 43.0–77.0)
Neutro Abs: 2.1 10*3/uL (ref 1.4–7.7)
Platelets: 323 10*3/uL (ref 150.0–400.0)
RBC: 4.31 Mil/uL (ref 4.22–5.81)
RDW: 13.6 % (ref 11.5–15.5)
WBC: 6 10*3/uL (ref 4.0–10.5)

## 2017-08-13 LAB — PSA: PSA: 3.04 ng/mL (ref 0.10–4.00)

## 2017-08-13 NOTE — Addendum Note (Signed)
Addended by: Caffie Pinto on: 08/13/2017 04:58 PM   Modules accepted: Orders

## 2017-08-13 NOTE — Patient Instructions (Signed)
Pt states he has been having diarrhea on and off since April. Pt reports he can sometimes see blood in stool. Pt has appointment with GI on Wednesday of this week, but states he would like to see a doctor today. Pt placed on schedule to see Mackie Pai, PA directly following this visit.   Please schedule your next medicare wellness visit with me in 1 yr.  Continue to eat heart healthy diet (full of fruits, vegetables, whole grains, lean protein, water--limit salt, fat, and sugar intake) and increase physical activity as tolerated.  Continue doing brain stimulating activities (puzzles, reading, adult coloring books, staying active) to keep memory sharp.    Mr. Ausmus , Thank you for taking time to come for your Medicare Wellness Visit. I appreciate your ongoing commitment to your health goals. Please review the following plan we discussed and let me know if I can assist you in the future.   These are the goals we discussed: Goals    . Get stomach issue resolved       This is a list of the screening recommended for you and due dates:  Health Maintenance  Topic Date Due  . Tetanus Vaccine  10/26/1998  . Flu Shot  09/27/2017  . HIV Screening  Completed    Health Maintenance, Male A healthy lifestyle and preventive care is important for your health and wellness. Ask your health care provider about what schedule of regular examinations is right for you. What should I know about weight and diet? Eat a Healthy Diet  Eat plenty of vegetables, fruits, whole grains, low-fat dairy products, and lean protein.  Do not eat a lot of foods high in solid fats, added sugars, or salt.  Maintain a Healthy Weight Regular exercise can help you achieve or maintain a healthy weight. You should:  Do at least 150 minutes of exercise each week. The exercise should increase your heart rate and make you sweat (moderate-intensity exercise).  Do strength-training exercises at least twice a week.  Watch  Your Levels of Cholesterol and Blood Lipids  Have your blood tested for lipids and cholesterol every 5 years starting at 38 years of age. If you are at high risk for heart disease, you should start having your blood tested when you are 38 years old. You may need to have your cholesterol levels checked more often if: ? Your lipid or cholesterol levels are high. ? You are older than 38 years of age. ? You are at high risk for heart disease.  What should I know about cancer screening? Many types of cancers can be detected early and may often be prevented. Lung Cancer  You should be screened every year for lung cancer if: ? You are a current smoker who has smoked for at least 30 years. ? You are a former smoker who has quit within the past 15 years.  Talk to your health care provider about your screening options, when you should start screening, and how often you should be screened.  Colorectal Cancer  Routine colorectal cancer screening usually begins at 38 years of age and should be repeated every 5-10 years until you are 38 years old. You may need to be screened more often if early forms of precancerous polyps or small growths are found. Your health care provider may recommend screening at an earlier age if you have risk factors for colon cancer.  Your health care provider may recommend using home test kits to check for hidden blood  in the stool.  A small camera at the end of a tube can be used to examine your colon (sigmoidoscopy or colonoscopy). This checks for the earliest forms of colorectal cancer.  Prostate and Testicular Cancer  Depending on your age and overall health, your health care provider may do certain tests to screen for prostate and testicular cancer.  Talk to your health care provider about any symptoms or concerns you have about testicular or prostate cancer.  Skin Cancer  Check your skin from head to toe regularly.  Tell your health care provider about any new  moles or changes in moles, especially if: ? There is a change in a mole's size, shape, or color. ? You have a mole that is larger than a pencil eraser.  Always use sunscreen. Apply sunscreen liberally and repeat throughout the day.  Protect yourself by wearing long sleeves, pants, a wide-brimmed hat, and sunglasses when outside.  What should I know about heart disease, diabetes, and high blood pressure?  If you are 27-74 years of age, have your blood pressure checked every 3-5 years. If you are 76 years of age or older, have your blood pressure checked every year. You should have your blood pressure measured twice-once when you are at a hospital or clinic, and once when you are not at a hospital or clinic. Record the average of the two measurements. To check your blood pressure when you are not at a hospital or clinic, you can use: ? An automated blood pressure machine at a pharmacy. ? A home blood pressure monitor.  Talk to your health care provider about your target blood pressure.  If you are between 73-9 years old, ask your health care provider if you should take aspirin to prevent heart disease.  Have regular diabetes screenings by checking your fasting blood sugar level. ? If you are at a normal weight and have a low risk for diabetes, have this test once every three years after the age of 51. ? If you are overweight and have a high risk for diabetes, consider being tested at a younger age or more often.  A one-time screening for abdominal aortic aneurysm (AAA) by ultrasound is recommended for men aged 45-75 years who are current or former smokers. What should I know about preventing infection? Hepatitis B If you have a higher risk for hepatitis B, you should be screened for this virus. Talk with your health care provider to find out if you are at risk for hepatitis B infection. Hepatitis C Blood testing is recommended for:  Everyone born from [birth year] through 1966.  Anyone with  known risk factors for hepatitis C.  Sexually Transmitted Diseases (STDs)  You should be screened each year for STDs including gonorrhea and chlamydia if: ? You are sexually active and are younger than 38 years of age. ? You are older than 38 years of age and your health care provider tells you that you are at risk for this type of infection. ? Your sexual activity has changed since you were last screened and you are at an increased risk for chlamydia or gonorrhea. Ask your health care provider if you are at risk.  Talk with your health care provider about whether you are at high risk of being infected with HIV. Your health care provider may recommend a prescription medicine to help prevent HIV infection.  What else can I do?  Schedule regular health, dental, and eye exams.  Stay current with your vaccines (immunizations).  Do not use any tobacco products, such as cigarettes, chewing tobacco, and e-cigarettes. If you need help quitting, ask your health care provider.  Limit alcohol intake to no more than 2 drinks per day. One drink equals 12 ounces of beer, 5 ounces of wine, or 1 ounces of hard liquor.  Do not use street drugs.  Do not share needles.  Ask your health care provider for help if you need support or information about quitting drugs.  Tell your health care provider if you often feel depressed.  Tell your health care provider if you have ever been abused or do not feel safe at home. This information is not intended to replace advice given to you by your health care provider. Make sure you discuss any questions you have with your health care provider. Document Released: 08/12/2007 Document Revised: 10/13/2015 Document Reviewed: 11/17/2014 Elsevier Interactive Patient Education  [employer].

## 2017-08-13 NOTE — Progress Notes (Signed)
Reviewed and agree.  Nance Pear NP

## 2017-08-13 NOTE — Progress Notes (Signed)
Subjective:    Patient ID: Matthew Fitzpatrick, male    DOB: 1980/03/21, 37 y.o.   MRN: [id number]  HPI  Pt in with history of loose stools and bright red blood in his stools for months. Since around April 12th. Pt has seen GI one time already with [facility]. He was supposed to have colonoscopy. Some miscommunication at that time so he missed his date for colonoscopy in early June. But he has follow up on this  Wednesday.  Pt has not vomiting except for when he drank prep he vomited one time in early june . Then procedure was canceled by GI.  Some rectal itching for some time as well. As long as he has been having blood in stools.No hx of constipation preceding the diarrhea.   Review of Systems  Constitutional: Negative for chills, diaphoresis, fatigue and fever.  Respiratory: Negative for chest tightness, shortness of breath and wheezing.   Cardiovascular: Negative for chest pain and palpitations.  Gastrointestinal: Positive for abdominal pain and diarrhea. Negative for abdominal distention, constipation, nausea and vomiting.       Faint GI discomfort.  Loose stools. About 10-12 mild loose stools for months.  Genitourinary: Positive for frequency.       Sometimes difficult urinating. Hard to start flow of urine. Since around April.   Some frequency incrase and small volumes at times.  Musculoskeletal: Negative for back pain, joint swelling and neck pain.  Skin: Negative for rash.  Psychiatric/Behavioral: Negative for behavioral problems, confusion, dysphoric mood, sleep disturbance and suicidal ideas. The patient is not nervous/anxious.     Past Medical History:  Diagnosis Date  . Acute conjunctivitis 02/12/2014  . Acute gastroenteritis 08/12/2015  . ANXIETY 03/02/2006   Qualifier: Diagnosis of  By: [name] MD, [name]    . Bronchitis 01/26/2011  . CAP (community acquired pneumonia)   . CHICKENPOX, HX OF 06/20/2006   Annotation: age 48 Qualifier: Diagnosis of  By: [name] MD,  Jenny Reichmann    . COUGH, CHRONIC 12/23/2008   Qualifier: Diagnosis of  By: [name] MD, [name]    . Deaf   . 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 DEPRESSION 03/02/2006   Qualifier: Diagnosis of  By: [name] MD, [name]    . Dyslipidemia 07/02/2012  . 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 FACIAL RASH 11/17/2008   Qualifier: Diagnosis of  By: [name] MD, [name]    . GERD 07/20/2009   Qualifier: Diagnosis of  By: [name] MD, [name]    . GERD (gastroesophageal reflux disease)   . 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 Obesity (BMI 30.0-34.9) 04/20/2016  . PEDICULOSIS PUBIS (PUBIC LOUSE) 06/20/2006   Annotation: 4/06 Qualifier: Diagnosis of  By: [name] MD, [name]    . Poor dentition 09/21/2016  . 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 Pulmonary nodule   . SINUSITIS, CHRONIC 02/18/2009   Qualifier: Diagnosis of  By: Tommy Medal MD, Roderic Scarce    . Urinary frequency 04/06/2009   Qualifier: Diagnosis of  By: [name] MD, [name]       Social History   Socioeconomic History  . Marital status: Single    Spouse name: Not on file  . Number of children: Not on file  . Years of education: Not on file  . Highest education  level: Not on file  Occupational History  . Not on file  Social Needs  . Financial resource strain: Not on file  . Food insecurity:    Worry: Not on file    Inability: Not on file  . Transportation needs:    Medical: Not on file    Non-medical: Not on file  Tobacco Use  . Smoking status: Never Smoker  . Smokeless tobacco: Never Used  Substance and Sexual Activity  . Alcohol use: No    Alcohol/week: 0.0 oz  . Drug use: No  . Sexual activity: Never    Comment: given condoms  Lifestyle  . Physical activity:    Days per week: Not on file    Minutes per session: Not on file  . Stress: Not on file  Relationships  . Social connections:      Talks on phone: Not on file    Gets together: Not on file    Attends religious service: Not on file    Active member of club or organization: Not on file    Attends meetings of clubs or organizations: Not on file    Relationship status: Not on file  . Intimate partner violence:    Fear of current or ex partner: Not on file    Emotionally abused: Not on file    Physically abused: Not on file    Forced sexual activity: Not on file  Other Topics Concern  . 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  Family History  Problem Relation Age of Onset  . Headache Mother   . Hypertension Mother   . Heart attack Father 14  . Sudden Cardiac Death Neg Hx     No Known Allergies  Current Outpatient Medications on File Prior to Visit  Medication Sig Dispense Refill  . bictegravir-emtricitabine-tenofovir AF (BIKTARVY) 50-200-25 MG TABS tablet Take 1 tablet by mouth daily. 30 tablet 11   No current facility-administered medications on file prior to visit.     BP 122/80   Pulse 91   Ht 5' 8"  (1.727 m)   Wt 220 lb (99.8 kg)   SpO2 97%   BMI 33.45 kg/m       Objective:   Physical Exam  General Appearance- Not in acute distress.  HEENT Eyes- Scleraeral/Conjuntiva-bilat- Not Yellow. Mouth & Throat- Normal.  Chest and Lung Exam Auscultation: Breath sounds:-Normal. Adventitious sounds:- No Adventitious sounds.  Cardiovascular Auscultation:Rythm - Regular. Heart Sounds -Normal heart sounds.  Abdomen Inspection:-Inspection Normal.  Palpation/Perucssion: Palpation and Percussion of the abdomen reveal- Non Tender, No Rebound tenderness, No rigidity(Guarding) and No Palpable abdominal masses.  Liver:-Normal.  Spleen:- Normal.   Rectal Upon brief inspection. NO  rash to rectum. No hemorrhoids seen.      Assessment & Plan:  For history of abdomen pain, loose stools and diarrhea, will get cbc to assess if anemia and check liver enzymes and kidney function.  For urine hesitancy will get psa and urine sample. May send you urine out for culture.   Follow up date to be determined after lab review.  Please have [facility] send [place] work up results/colonoscopy.   Mackie Pai, PA-C

## 2017-08-13 NOTE — Patient Instructions (Addendum)
For history of abdomen pain, loose stools and diarrhea, will get cbc to assess if anemia and check liver enzymes and kidney function.  For urine hesitancy will get psa and urine sample. May send you urine out for culture.   Follow up date to be determined after lab review.  Please have [facility] send [place] work up results/colonoscopy.

## 2017-08-14 LAB — URINE CULTURE
MICRO NUMBER:: 90722456
Result:: NO GROWTH
SPECIMEN QUALITY: ADEQUATE

## 2017-08-15 DIAGNOSIS — K625 Hemorrhage of anus and rectum: Secondary | ICD-10-CM | POA: Diagnosis not present

## 2017-08-15 DIAGNOSIS — R197 Diarrhea, unspecified: Secondary | ICD-10-CM | POA: Diagnosis not present

## 2017-08-15 DIAGNOSIS — R109 Unspecified abdominal pain: Secondary | ICD-10-CM | POA: Diagnosis not present

## 2017-08-15 DIAGNOSIS — K602 Anal fissure, unspecified: Secondary | ICD-10-CM | POA: Diagnosis not present

## 2017-08-16 ENCOUNTER — Telehealth: Payer: Self-pay

## 2017-08-22 ENCOUNTER — Telehealth: Payer: Self-pay

## 2017-08-22 ENCOUNTER — Telehealth: Payer: Self-pay | Admitting: *Deleted

## 2017-08-22 NOTE — Telephone Encounter (Signed)
Can you please ask where he was scheduled?  I see he has seen [facility] in the past but not recently. OK to refer to another GI group please. Reason for referral is rectal bleeding. Thanks.

## 2017-08-22 NOTE — Telephone Encounter (Signed)
Matthew Fitzpatrick -- pt saw Matthew Fitzpatrick for this. Can you advise in his absence?  Copied from [place] [id number]. Topic: Referral - Question >> Aug 22, 2017  1:59 PM Matthew Fitzpatrick wrote: Reason for CRM:   Patient would like to know if he can be referred to a different GI doctor because this one charges $150.00 to cancel and he doesn't see the sense in that when he is cancelling so far in advance.

## 2017-08-22 NOTE — Telephone Encounter (Signed)
Patient called with complaints of current GI physician will not call him back. He states he missed an appointment and was charged a $100 fee.  He has complaints of persistent diarrhea with some blood present. He feels no one will return his call and requested we call the office. I suggested the patient call his primary care office for a work in visit or go to the local emergency room.    Laverle Patter, RN

## 2017-08-23 ENCOUNTER — Other Ambulatory Visit: Payer: Self-pay

## 2017-08-23 DIAGNOSIS — K625 Hemorrhage of anus and rectum: Secondary | ICD-10-CM

## 2017-08-23 NOTE — Telephone Encounter (Signed)
Talked to patient and he was scheduled to see a GI Dr. Deliah Goody. At [facility] He will like to get appointment with another GI Dr.  Patient will be referred to Dr. Lyndel Safe.

## 2017-08-23 NOTE — Telephone Encounter (Signed)
Error

## 2017-08-23 NOTE — Progress Notes (Unsigned)
am

## 2017-08-24 ENCOUNTER — Encounter: Payer: Self-pay | Admitting: Internal Medicine

## 2017-09-11 ENCOUNTER — Other Ambulatory Visit: Payer: Medicare Other

## 2017-09-25 ENCOUNTER — Encounter: Payer: Self-pay | Admitting: Internal Medicine

## 2017-09-25 ENCOUNTER — Ambulatory Visit (INDEPENDENT_AMBULATORY_CARE_PROVIDER_SITE_OTHER): Payer: Medicare Other | Admitting: Internal Medicine

## 2017-09-25 DIAGNOSIS — B20 Human immunodeficiency virus [HIV] disease: Secondary | ICD-10-CM

## 2017-09-25 DIAGNOSIS — R197 Diarrhea, unspecified: Secondary | ICD-10-CM

## 2017-09-25 DIAGNOSIS — R109 Unspecified abdominal pain: Secondary | ICD-10-CM | POA: Diagnosis not present

## 2017-09-25 NOTE — Assessment & Plan Note (Signed)
He has very chronic abdominal pain of unknown origin.

## 2017-09-25 NOTE — Assessment & Plan Note (Signed)
His infection has been under excellent control.  He will continue Biktarvy, get lab work today and follow-up in 6 months.

## 2017-09-25 NOTE — Progress Notes (Signed)
Patient Active Problem List   Diagnosis Date Noted  . Human immunodeficiency virus (HIV) disease ([place]) 03/02/2006    Priority: High  . Atypical chest pain 04/27/2017  . Obesity (BMI 30-39.9) 04/27/2017  . Abdominal pain 03/27/2017  . Palpitations 10/19/2016  . Dyspnea on exertion 10/19/2016  . Poor dentition 09/21/2016  . Pulmonary nodule 08/17/2016  . Obesity (BMI 30.0-34.9) 04/20/2016  . Diarrhea 07/02/2012  . Dyslipidemia 07/02/2012  . GERD (gastroesophageal reflux disease) 06/28/2010  . 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 ANXIETY 03/02/2006  . DEPRESSION 03/02/2006  . ALLERGIC RHINITIS 03/02/2006    Patient's Medications  New Prescriptions   No medications on file  Previous Medications   BICTEGRAVIR-EMTRICITABINE-TENOFOVIR AF (BIKTARVY) 50-200-25 MG TABS TABLET    Take 1 tablet by mouth daily.  Modified Medications   No medications on file  Discontinued Medications   No medications on file    Subjective: Matthew Fitzpatrick is in for his routine HIV follow-up visit.  He says he continues to have problems with diarrhea.  He tells me that the diarrhea began in April although he was having diarrhea when he saw me in January.  At that time studies for C. difficile and a GI pathogen panel were all normal.  He gives a very confusing history today through the sign language interpreter.  They were finally able to ascertain that he saw Deliah Goody, PA with [facility] on 08/21/2017.  He was set up for colonoscopy at the end of June but canceled that appointment because he was not feeling well.  He has a follow-up appointment on 09/28/2017 at which time he expects the colonoscopy will be rescheduled.  He is concerned that he may not be able to find someone to drive him to the colonoscopy appointment.  He changed to [place] at the time of his appointment in January.  He says he has been concerned that Phillips Odor was causing his diarrhea but I  reminded him that his diarrhea began before the change in his HIV medication.  He denies missing any doses.  Review of Systems: Review of Systems  Constitutional: Negative for chills, diaphoresis, fever, malaise/fatigue and weight loss.  HENT: Negative for sore throat.   Respiratory: Negative for cough, sputum production and shortness of breath.   Cardiovascular: Negative for chest pain.  Gastrointestinal: Positive for abdominal pain, diarrhea and heartburn. Negative for nausea and vomiting.  Genitourinary: Negative for dysuria and frequency.  Musculoskeletal: Negative for joint pain and myalgias.  Skin: Negative for rash.  Neurological: Negative for dizziness and headaches.  Psychiatric/Behavioral: Negative for depression and substance abuse. The patient is not nervous/anxious.     Past Medical History:  Diagnosis Date  . Acute conjunctivitis 02/12/2014  . Acute gastroenteritis 08/12/2015  . ANXIETY 03/02/2006   Qualifier: Diagnosis of  By: [name] MD, Ndea Kilroy    . Bronchitis 01/26/2011  . CAP (community acquired pneumonia)   . CHICKENPOX, HX OF 06/20/2006   Annotation: age 65 Qualifier: Diagnosis of  By: [name] MD, Jenny Reichmann    . COUGH, CHRONIC 12/23/2008   Qualifier: Diagnosis of  By: [name] MD, Olivier Frayre    . Deaf   . 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 DEPRESSION 03/02/2006   Qualifier: Diagnosis of  By: [name] MD, Libra Gatz    . Dyslipidemia 07/02/2012  . 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 FACIAL RASH 11/17/2008   Qualifier: Diagnosis of  By: [name] MD, Kortnie Stovall    . GERD 07/20/2009   Qualifier: Diagnosis of  By: [name] MD, Latorie Montesano    . GERD (gastroesophageal reflux disease)   . HIV disease ([place])   . Obesity (BMI 30.0-34.9) 04/20/2016  . PEDICULOSIS PUBIS ([place]) 06/20/2006   Annotation: 4/06 Qualifier: Diagnosis of  By: [name] MD, Lonzell Dorris    . Poor dentition 09/21/2016  .  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 Pulmonary nodule   . SINUSITIS, CHRONIC 02/18/2009   Qualifier: Diagnosis of  By: Tommy Medal MD, Roderic Scarce    . Urinary frequency 04/06/2009   Qualifier: Diagnosis of  By: [name] MD, Pranika Finks      Social History   Tobacco Use  . Smoking status: Never Smoker  . Smokeless tobacco: Never Used  Substance Use Topics  . Alcohol use: No    Alcohol/week: 0.0 oz  . Drug use: No    Family History  Problem Relation Age of Onset  . Headache Mother   . Hypertension Mother   . Heart attack Father 90  . Sudden Cardiac Death Neg Hx     No Known Allergies  Health Maintenance  Topic Date Due  . TETANUS/TDAP  10/26/1998  . INFLUENZA VACCINE  09/27/2017  . HIV Screening  Completed    Objective:  Vitals:   09/25/17 1020  BP: 123/79  Pulse: 87  Temp: 98.2 F (36.8 C)  TempSrc: Oral  Weight: 215 lb (97.5 kg)   Body mass index is 32.69 kg/m.  Physical Exam  Constitutional: He is oriented to person, place, and time. No distress.  HENT:  Mouth/Throat: No oropharyngeal exudate.  Eyes: Conjunctivae are normal.  Cardiovascular: Normal rate, regular rhythm and normal heart sounds.  No murmur heard. Pulmonary/Chest: Effort normal and breath sounds normal.  Abdominal: Soft. He exhibits no mass. There is no tenderness.  Musculoskeletal: Normal range of motion.  Neurological: He is alert and oriented to person, place, and time.  Skin: No rash noted.    Lab Results Lab Results  Component Value Date   WBC 6.0 08/13/2017   HGB 13.1 08/13/2017   HCT 39.0 08/13/2017   MCV 90.5 08/13/2017   PLT 323.0 08/13/2017    Lab Results  Component Value Date   CREATININE 0.96 08/13/2017   BUN 12 08/13/2017   NA 137 08/13/2017   K 4.3 08/13/2017   CL 101 08/13/2017   CO2 30 08/13/2017    Lab Results  Component Value Date   ALT 43 08/13/2017   AST 41 (H) 08/13/2017   ALKPHOS 134 (H)  08/13/2017   BILITOT 0.5 08/13/2017    Lab Results  Component Value Date   CHOL 218 (H) 03/02/2016   HDL 45 03/02/2016   LDLCALC 145 (H) 03/02/2016   TRIG 141 03/02/2016   CHOLHDL 4.8 03/02/2016   Lab Results  Component Value Date   LABRPR NON REAC 09/21/2016   HIV 1 RNA Quant (copies/mL)  Date Value  03/12/2017 <20 DETECTED (A)  09/21/2016 <20 DETECTED (A)  03/02/2016 59 (H)   CD4 T Cell Abs (/uL)  Date Value  03/12/2017 1,120  09/21/2016 1,250  03/02/2016 1,030     Problem List Items Addressed This Visit      High   Human immunodeficiency virus (HIV) disease ([place])  His infection has been under excellent control.  He will continue Biktarvy, get lab work today and follow-up in 6 months.      Relevant Orders   T-helper cell (CD4)- ([facility] clinic only)   HIV 1 RNA quant-no reflex-bld     Unprioritized   Abdominal pain    He has very chronic abdominal pain of unknown origin.      Diarrhea    He has chronic diarrhea of unknown cause.  I doubt that it is due to infection.  It is not caused by his Biktarvy.  I encouraged him to keep his appointments with the [facility].  I referred him to our case manager to see if they can offer any assistance getting him to his colonoscopy visit.           Michel Bickers, MD [facility] [place] [facility] [phone] pager   [phone] cell 09/25/2017, 10:46 AM

## 2017-09-25 NOTE — Assessment & Plan Note (Signed)
He has chronic diarrhea of unknown cause.  I doubt that it is due to infection.  It is not caused by his Biktarvy.  I encouraged him to keep his appointments with the [facility].  I referred him to our case manager to see if they can offer any assistance getting him to his colonoscopy visit.

## 2017-09-26 LAB — T-HELPER CELL (CD4) - (RCID CLINIC ONLY)
CD4 T CELL ABS: 950 /uL (ref 400–2700)
CD4 T CELL HELPER: 28 % — AB (ref 33–55)

## 2017-09-27 ENCOUNTER — Encounter: Payer: Self-pay | Admitting: Internal Medicine

## 2017-09-28 LAB — HIV-1 RNA QUANT-NO REFLEX-BLD
HIV 1 RNA Quant: <20 copies/mL
HIV-1 RNA Quant, Log: <1.3 Log copies/mL

## 2017-10-08 ENCOUNTER — Ambulatory Visit: Payer: Medicare Other | Admitting: Gastroenterology

## 2017-10-23 ENCOUNTER — Telehealth: Payer: Self-pay | Admitting: Internal Medicine

## 2017-10-23 NOTE — Telephone Encounter (Signed)
Rec'd from [facility] forwarded 7 pages to Dr.Gessner Glendell Docker

## 2017-10-31 ENCOUNTER — Encounter: Payer: Self-pay | Admitting: Internal Medicine

## 2017-10-31 ENCOUNTER — Ambulatory Visit (INDEPENDENT_AMBULATORY_CARE_PROVIDER_SITE_OTHER): Payer: Medicare Other | Admitting: Internal Medicine

## 2017-10-31 VITALS — BP 166/84 | HR 104 | Ht 69.0 in | Wt 211.2 lb

## 2017-10-31 DIAGNOSIS — R234 Changes in skin texture: Secondary | ICD-10-CM | POA: Diagnosis not present

## 2017-10-31 DIAGNOSIS — K601 Chronic anal fissure: Secondary | ICD-10-CM | POA: Diagnosis not present

## 2017-10-31 DIAGNOSIS — K625 Hemorrhage of anus and rectum: Secondary | ICD-10-CM | POA: Diagnosis not present

## 2017-10-31 DIAGNOSIS — K529 Noninfective gastroenteritis and colitis, unspecified: Secondary | ICD-10-CM | POA: Diagnosis not present

## 2017-10-31 DIAGNOSIS — L989 Disorder of the skin and subcutaneous tissue, unspecified: Secondary | ICD-10-CM | POA: Diagnosis not present

## 2017-10-31 MED ORDER — PEG-KCL-NACL-NASULF-NA ASC-C 140 G PO SOLR: 1.0000 | Freq: Once | ORAL | 0 refills | Status: AC

## 2017-10-31 NOTE — Patient Instructions (Addendum)
  You have been scheduled for a colonoscopy. Please follow written instructions given to you at your visit today.  Please pick up your prep supplies at the pharmacy. If you use inhalers (even only as needed), please bring them with you on the day of your procedure.  Please continue the nitro-glycerin that you were given at [place].   Please see his PCP about his skin rash per Dr Carlean Purl.   I appreciate the opportunity to care for you. Silvano Rusk, MD, [facility]

## 2017-10-31 NOTE — Progress Notes (Signed)
Matthew Fitzpatrick 37 y.o. [birth year] [id number] Referred by: [name], NP  Assessment & Plan:   Encounter Diagnoses  Name Primary?  . Rectal bleeding Yes  . Chronic diarrhea   . Chronic anterior anal fissure   . Disorder of perianal skin   . Peeling skin hands and feet    Colonoscopy is necessary to investigate these problems.  Certainly the bleeding could be coming from a fissure.  Not sure where the diarrhea is coming from and this constellation of problems in this setting merits a colonoscopy.  Inflammatory bowel disease certainly possible especially given previous imaging suggesting that.  He had 1 of the scheduled at [facility] but did not show had some difficulties with the prep.  We will provide samples of a prep so cost is not an issue.  Advised that he stop anal receptive intercourse for the time being at least a month given the fissure.  The patient is not sure he can get a ride and have a care partner.  We cannot perform the procedure unless there is somebody present who can be a care partner.  He thinks that the [facility] may be able to help with this through some programs and I will investigate.  I have advised that he see his primary care provider regarding these palms and soles skin issues.  I appreciate the opportunity to care for this patient. CC: [name], NP Dr. Michel Bickers Subjective:   Chief Complaint: Rectal bleeding  HPI The patient is a 37 year old African-American man with chronic recurrent rectal bleeding.  He had was evaluated at [place] and scheduled for a colonoscopy earlier this year but had difficulties of the prep or a ride and did not have that done.  Also having diarrhea problems.  Has been diagnosed with an anal fissure.  He is deaf and presents with a translator communicating through sign language.  Multiple loose bowel movements a day and rectal bleeding that is both bright and dark or red.  Continues to participate in  anal receptive intercourse.  No fevers no purulent discharge.  C. difficile toxins GI pathogen panel tests negative in 2019.  I am not 100% sure about this explanation but the patient did say there was some concern about infection still and that might of been why the colonoscopy was not done no other records indicate that it was failure to comply with the prep and/or get rides.  CT scan January 2019 records reviewed images reviewed negative.  Abdomen and pelvis with contrast.  Patient had previously been seen here.  He had led [facility] to believe that he had a colonoscopy before but I do not think that the case.  Could have been a communication issue.  Recurrent symptoms of been going on for almost a year if not longer.  The anal fissure symptoms did begin after anal intercourse.  Nitroglycerin ointment 0.4% was applied after prescription and he did use that for some time but is not clear that it helped.  Note that there is a 2010 endoscopy report in the computer but it is really a stool culture that was negative. He had previously been seen by Dr. Deatra Ina of our group and then by me.  In 2018 a CT scan, images reviewed suggested proctocolitis.  Antibiotics have been prescribed and he was better but because of persistent diarrhea then I had recommended a colonoscopy which did not happen.  2010 notes chronic diarrhea.  Infectious work-up Dr. Deatra Ina 2010- stool studies.  HIV testing January 2019 RNA less than 20 low levels as would expect on therapy.  Saw Dr. [name] in July and HIV testing was again good.  Notes indicate that patient changed HIV medicine in January and he thought that his current HIV medication that started in January might be causing diarrhea but Dr. Hale Bogus notes indicate the diarrhea preceded that.  It looks like he has been referred to the case manager at [facility] for help with transportation for his colonoscopy. No Known Allergies Current Meds  Medication Sig  .  bictegravir-emtricitabine-tenofovir AF (BIKTARVY) 50-200-25 MG TABS tablet Take 1 tablet by mouth daily.   Past Medical History:  Diagnosis Date  . Acute conjunctivitis 02/12/2014  . Acute gastroenteritis 08/12/2015  . ANXIETY 03/02/2006   Qualifier: Diagnosis of  By: [name] MD, [name]    . Bronchitis 01/26/2011  . CAP (community acquired pneumonia)   . CHICKENPOX, HX OF 06/20/2006   Annotation: age 15 Qualifier: Diagnosis of  By: [name] MD, Jenny Reichmann    . COUGH, CHRONIC 12/23/2008   Qualifier: Diagnosis of  By: [name] MD, [name]    . Deaf   . 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 DEPRESSION 03/02/2006   Qualifier: Diagnosis of  By: [name] MD, [name]    . Dyslipidemia 07/02/2012  . 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 FACIAL RASH 11/17/2008   Qualifier: Diagnosis of  By: [name] MD, [name]    . GERD 07/20/2009   Qualifier: Diagnosis of  By: [name] MD, [name]    . GERD (gastroesophageal reflux disease)   . HIV disease ([place])   . Obesity (BMI 30.0-34.9) 04/20/2016  . PEDICULOSIS PUBIS (PUBIC LOUSE) 06/20/2006   Annotation: 4/06 Qualifier: Diagnosis of  By: [name] MD, [name]    . Poor dentition 09/21/2016  . 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 Pulmonary nodule   . SINUSITIS, CHRONIC 02/18/2009   Qualifier: Diagnosis of  By: Tommy Medal MD, Roderic Scarce    . Urinary frequency 04/06/2009   Qualifier: Diagnosis of  By: [name] MD, [name]     Past Surgical History:  Procedure Laterality Date  . THROAT SURGERY    . UMBILICAL HERNIA REPAIR     Social History   Social History Narrative   Lives alone   Mom lives with his sister in [place]   No children   Works in Pensions consultant in [place]   Enjoys working on [employer]   No pets   family history includes Headache in his mother; Heart attack (age  of onset: 41) in his father; Hypertension in his mother.   Review of Systems All other ROS negative  Objective:   Physical Exam @BP  (!) 166/84 (BP Location: Left Arm, Patient Position: Sitting, Cuff Size: Normal)   Pulse (!) 104   Ht 5' 9"  (1.753 m) Comment: height measured without shoes  Wt 211 lb 4 oz (95.8 kg)   BMI 31.20 kg/m @  General:  Well-developed, well-nourished and in no acute distress Eyes:  anicteric. ENT:   Mouth and posterior pharynx free of lesions.  Neck:   supple w/o thyromegaly or mass.  Lungs: Clear to auscultation bilaterally. Heart:  S1S2, no rubs, murmurs, gallops. Abdomen:  soft, non-tender, no hepatosplenomegaly, hernia, or mass and BS+.  Rectal: ant  anal fissure Slight white mildly raised papules Lymph:  no cervical or supraclavicular adenopathy. Extremities:   no edema, cyanosis or clubbing Skin  Peeling palms and soles w/ hyperkeratosis Neuro:  A&O x 3.  Psych:  appropriate mood and  Affect.   Data Reviewed:  See HPI

## 2017-11-03 ENCOUNTER — Encounter: Payer: Self-pay | Admitting: Internal Medicine

## 2017-11-28 ENCOUNTER — Telehealth: Payer: Self-pay | Admitting: *Deleted

## 2017-11-28 NOTE — Telephone Encounter (Signed)
Patient called upset because the pharmacy can not deliver his medication on Mondays. They only deliver on Tuesday-Fridays and he wanted me to call and make them bring him his medication on Mondays. Advised him he should speak with the pharmacy directly as we do not own them and they set their delivery schedule. He was not satisfied with my answer and asked to speak with Sharyn Lull RN she has helped him in the past. Advised him she is out of the office until 12/03/17 but will have her give him a call once she returns. He ended the call.

## 2017-12-03 NOTE — Telephone Encounter (Signed)
I am unable to assist patient further than Darnelle Maffucci already attempted to. Landis Gandy, RN

## 2017-12-17 ENCOUNTER — Encounter: Payer: Medicare Other | Admitting: Internal Medicine

## 2018-01-22 ENCOUNTER — Telehealth: Payer: Self-pay | Admitting: Internal Medicine

## 2018-01-22 NOTE — Telephone Encounter (Signed)
Pt calling states he has a procedure Monday 12-2 and he needs another sample of Plenvu and new instructions- he has an OV -10-31-2017 and he drank some of the prep for his procedure in October, then had to cancel due to a ride- he was rescheduled for a colon 12-2 Monday but was NOT RS a PV- pt needs instructions- I tried to get pt to come in tomorrow for a PV but he states he does not need a PV, he understands just needs the medicine and instructions - pt is deaf , this call was thru the interpreter line- I explained to the pt if he is not cleaned out he will need another procedure with a PV- he verbalized thru The phone interpreter he understood- he was instructed to stop nuts, seeds, corn, popcorn, beans, peas, salads and raw veggies starting tomorrow 01-23-18- he said he understood - he will get Plenvu sample Lot [id number] exp 09/2018 and new instructions WED 11-27 by 2 pm 3rd floor desk    Lelan Pons PV

## 2018-01-28 ENCOUNTER — Encounter: Payer: Self-pay | Admitting: Internal Medicine

## 2018-01-28 ENCOUNTER — Other Ambulatory Visit: Payer: Self-pay

## 2018-01-28 ENCOUNTER — Ambulatory Visit (AMBULATORY_SURGERY_CENTER): Payer: Medicare Other | Admitting: Internal Medicine

## 2018-01-28 VITALS — BP 124/83 | HR 77 | Temp 98.6°F | Resp 22 | Ht 68.0 in | Wt 205.0 lb

## 2018-01-28 DIAGNOSIS — E669 Obesity, unspecified: Secondary | ICD-10-CM | POA: Diagnosis not present

## 2018-01-28 DIAGNOSIS — K529 Noninfective gastroenteritis and colitis, unspecified: Secondary | ICD-10-CM | POA: Diagnosis not present

## 2018-01-28 DIAGNOSIS — K6289 Other specified diseases of anus and rectum: Secondary | ICD-10-CM

## 2018-01-28 DIAGNOSIS — R197 Diarrhea, unspecified: Secondary | ICD-10-CM | POA: Diagnosis not present

## 2018-01-28 DIAGNOSIS — K5289 Other specified noninfective gastroenteritis and colitis: Secondary | ICD-10-CM

## 2018-01-28 DIAGNOSIS — K625 Hemorrhage of anus and rectum: Secondary | ICD-10-CM

## 2018-01-28 DIAGNOSIS — Z21 Asymptomatic human immunodeficiency virus [HIV] infection status: Secondary | ICD-10-CM | POA: Diagnosis not present

## 2018-01-28 MED ORDER — SODIUM CHLORIDE 0.9 % IV SOLN: 500.0000 mL | Freq: Once | INTRAVENOUS | Status: DC

## 2018-01-28 NOTE — Progress Notes (Signed)
To recovery, report to RN, VSS. 

## 2018-01-28 NOTE — Progress Notes (Signed)
Called to room to assist during endoscopic procedure.  Patient ID and intended procedure confirmed with present staff. Received instructions for my participation in the procedure from the performing physician.  

## 2018-01-28 NOTE — Op Note (Signed)
[facility] Patient Name: Matthew Fitzpatrick Procedure Date: 01/28/2018 10:45 AM MRN: [id number] Endoscopist: Gatha Mayer , MD Age: 37 Referring MD:  Date of Birth: [birth year] Gender: Male Account #: [id number] Procedure:                Colonoscopy Indications:              Chronic diarrhea, Rectal bleeding, Abnormal CT of                            the GI tract Medicines:                Propofol per Anesthesia, Monitored Anesthesia Care Procedure:                Pre-Anesthesia Assessment:                           - Prior to the procedure, a History and Physical                            was performed, and patient medications and                            allergies were reviewed. The patient's tolerance of                            previous anesthesia was also reviewed. The risks                            and benefits of the procedure and the sedation                            options and risks were discussed with the patient.                            All questions were answered, and informed consent                            was obtained. Prior Anticoagulants: The patient has                            taken no previous anticoagulant or antiplatelet                            agents. ASA Grade Assessment: II - A patient with                            mild systemic disease. After reviewing the risks                            and benefits, the patient was deemed in                            satisfactory condition to undergo the procedure.  After obtaining informed consent, the colonoscope                            was passed under direct vision. Throughout the                            procedure, the patient's blood pressure, pulse, and                            oxygen saturations were monitored continuously. The                            Colonoscope was introduced through the anus and                            advanced to the the  terminal ileum, with                            identification of the appendiceal orifice and IC                            valve. The colonoscopy was performed without                            difficulty. The patient tolerated the procedure                            well. The quality of the bowel preparation was                            excellent and poor. The terminal ileum, ileocecal                            valve, appendiceal orifice, and rectum were                            photographed. The bowel preparation used was Plenvu. Scope In: 10:55:03 AM Scope Out: 11:05:36 AM Scope Withdrawal Time: 0 hours 6 minutes 57 seconds  Total Procedure Duration: 0 hours 10 minutes 33 seconds  Findings:                 The perianal and digital rectal examinations were                            normal.                           Discontinuous areas of ulcerated mucosa were                            present in the rectum and in the recto-sigmoid                            colon. Biopsies were taken with a cold forceps for  histology. Verification of patient identification                            for the specimen was done. Estimated blood loss was                            minimal.                           The terminal ileum appeared normal.                           A large amount of stool was found in the ascending                            colon and in the cecum, precluding visualization.                           The exam was otherwise without abnormality on                            direct and retroflexion views.                           Biopsies for histology were taken with a cold                            forceps from the ascending colon, transverse colon,                            descending colon and sigmoid colon for evaluation                            of microscopic colitis. Complications:            No immediate complications. Estimated  Blood Loss:     Estimated blood loss was minimal. Impression:               - Preparation of the colon was poor.                           - Mucosal ulceration. Biopsied. rectum and                            rectosigmoid - mostly round and punched out but                            superficial but some geographic. ? IBD, ?                            infectious/other                           - The examined portion of the ileum was normal.                           -  Stool in the proximal ascending colon and in the                            cecum.                           - The examination was otherwise normal on direct                            and retroflexion views.                           - Biopsies were taken with a cold forceps from the                            ascending colon, transverse colon, descending colon                            and sigmoid colon for evaluation of microscopic                            colitis. Recommendation:           - Patient has a contact number available for                            emergencies. The signs and symptoms of potential                            delayed complications were discussed with the                            patient. Return to normal activities tomorrow.                            Written discharge instructions were provided to the                            patient.                           - Resume previous diet.                           - Continue present medications.                           - Await pathology results.                           - Repeat colonoscopy is recommended. The                            colonoscopy date will be determined after pathology                            results from today's exam  become available for                            review. Gatha Mayer, MD 01/28/2018 11:17:56 AM This report has been signed electronically.

## 2018-01-28 NOTE — Patient Instructions (Addendum)
   There is inflammation in the rectum and end of the colon near there.  I took biopsies to understand what is causing it and will let you know results and treatment plans - should be by next week.  I appreciate the opportunity to care for you. Gatha Mayer, MD, FACG  YOU HAD AN ENDOSCOPIC PROCEDURE TODAY AT [facility]:   Refer to the procedure report that was given to you for any specific questions about what was found during the examination.  If the procedure report does not answer your questions, please call your gastroenterologist to clarify.  If you requested that your care partner not be given the details of your procedure findings, then the procedure report has been included in a sealed envelope for you to review at your convenience later.  YOU SHOULD EXPECT: Some feelings of bloating in the abdomen. Passage of more gas than usual.  Walking can help get rid of the air that was put into your GI tract during the procedure and reduce the bloating. If you had a lower endoscopy (such as a colonoscopy or flexible sigmoidoscopy) you may notice spotting of blood in your stool or on the toilet paper. If you underwent a bowel prep for your procedure, you may not have a normal bowel movement for a few days.  Please Note:  You might notice some irritation and congestion in your nose or some drainage.  This is from the oxygen used during your procedure.  There is no need for concern and it should clear up in a day or so.  SYMPTOMS TO REPORT IMMEDIATELY:   Following lower endoscopy (colonoscopy or flexible sigmoidoscopy):  Excessive amounts of blood in the stool  Significant tenderness or worsening of abdominal pains  Swelling of the abdomen that is new, acute  Fever of 100F or higher  For urgent or emergent issues, a gastroenterologist can be reached at any hour by calling [phone].   DIET:  We do recommend a small meal at first, but then you may proceed to your  regular diet.  Drink plenty of fluids but you should avoid alcoholic beverages for 24 hours.  ACTIVITY:  You should plan to take it easy for the rest of today and you should NOT DRIVE or use heavy machinery until tomorrow (because of the sedation medicines used during the test).    FOLLOW UP: Our staff will call the number listed on your records the next business day following your procedure to check on you and address any questions or concerns that you may have regarding the information given to you following your procedure. If we do not reach you, we will leave a message.  However, if you are feeling well and you are not experiencing any problems, there is no need to return our call.  We will assume that you have returned to your regular daily activities without incident.  If any biopsies were taken you will be contacted by phone or by letter within the next 1-3 weeks.  Please call us at [phone] if you have not heard about the biopsies in 3 weeks.    SIGNATURES/CONFIDENTIALITY: You and/or your care partner have signed paperwork which will be entered into your electronic medical record.  These signatures attest to the fact that that the information above on your After Visit Summary has been reviewed and is understood.  Full responsibility of the confidentiality of this discharge information lies with you and/or your care-partner.

## 2018-01-29 ENCOUNTER — Telehealth: Payer: Self-pay | Admitting: *Deleted

## 2018-01-29 NOTE — Telephone Encounter (Signed)
Interpreter line reached ,pt unavailable ,left message that we called to f/u

## 2018-02-05 ENCOUNTER — Other Ambulatory Visit: Payer: Self-pay

## 2018-02-05 ENCOUNTER — Encounter: Payer: Self-pay | Admitting: Internal Medicine

## 2018-02-05 MED ORDER — PREDNISONE 10 MG PO TABS: ORAL_TABLET | ORAL | 1 refills | Status: DC

## 2018-02-05 NOTE — Progress Notes (Signed)
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

## 2018-02-09 ENCOUNTER — Other Ambulatory Visit: Payer: Self-pay

## 2018-02-09 ENCOUNTER — Encounter (HOSPITAL_BASED_OUTPATIENT_CLINIC_OR_DEPARTMENT_OTHER): Payer: Self-pay | Admitting: Emergency Medicine

## 2018-02-09 ENCOUNTER — Emergency Department (HOSPITAL_BASED_OUTPATIENT_CLINIC_OR_DEPARTMENT_OTHER)
Admission: EM | Admit: 2018-02-09 | Discharge: 2018-02-10 | Disposition: A | Discharge status: Home / Self Care | Payer: Medicare Other | Attending: Emergency Medicine | Admitting: Emergency Medicine

## 2018-02-09 DIAGNOSIS — M79672 Pain in left foot: Secondary | ICD-10-CM | POA: Diagnosis not present

## 2018-02-09 DIAGNOSIS — M25572 Pain in left ankle and joints of left foot: Secondary | ICD-10-CM

## 2018-02-09 DIAGNOSIS — Z79899 Other long term (current) drug therapy: Secondary | ICD-10-CM | POA: Insufficient documentation

## 2018-02-09 DIAGNOSIS — B20 Human immunodeficiency virus [HIV] disease: Secondary | ICD-10-CM | POA: Diagnosis not present

## 2018-02-09 DIAGNOSIS — M7989 Other specified soft tissue disorders: Secondary | ICD-10-CM | POA: Diagnosis not present

## 2018-02-09 NOTE — ED Triage Notes (Signed)
Patient states that he worked all day yesterday and had pain in both feet after work, more so on the left than the right . The patient reports that this am his feet hurt so bad that he could no longer walk and had pain to bilateral feet. Patient states that he has hx of problems with his boot. Patient took motrin at 7 am and it did not help

## 2018-02-10 ENCOUNTER — Emergency Department (HOSPITAL_BASED_OUTPATIENT_CLINIC_OR_DEPARTMENT_OTHER): Payer: Medicare Other

## 2018-02-10 DIAGNOSIS — M7989 Other specified soft tissue disorders: Secondary | ICD-10-CM | POA: Diagnosis not present

## 2018-02-10 DIAGNOSIS — M79672 Pain in left foot: Secondary | ICD-10-CM | POA: Diagnosis not present

## 2018-02-10 MED ORDER — NAPROXEN 375 MG PO TABS: ORAL_TABLET | ORAL | 0 refills | Status: DC

## 2018-02-10 MED ORDER — NAPROXEN 250 MG PO TABS
500.0000 mg | ORAL_TABLET | Freq: Once | ORAL | Status: AC
  Administered 2018-02-10: 500 mg via ORAL
  Filled 2018-02-10: qty 2

## 2018-02-10 NOTE — ED Notes (Signed)
Pt assessed using video interpreter for ASL [phone]

## 2018-02-10 NOTE — ED Provider Notes (Signed)
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  He is here with left ankle pain and swelling, primarily medially, that began while working on his feet 2 days ago.  The pain returned yesterday and is now persistent.  He rates his pain as a 10 out of 10, worse with movement or weightbearing.  He denies trauma.  He describes the sensation as like being rubbed it.  He denies pain on the right side.  He took Motrin yesterday morning without relief.   Past Medical History:  Diagnosis Date  . Acute conjunctivitis 02/12/2014  . Acute gastroenteritis 08/12/2015  . ANXIETY 03/02/2006   Qualifier: Diagnosis of  By: [name] MD, Merel Santoli    . Bronchitis 01/26/2011  . CAP (community acquired pneumonia)   . CHICKENPOX, HX OF 06/20/2006   Annotation: age 34 Qualifier: Diagnosis of  By: [name] MD, Jenny Reichmann    . COUGH, CHRONIC 12/23/2008   Qualifier: Diagnosis of  By: [name] MD, Anahi Belmar    . Deaf   . 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 DEPRESSION 03/02/2006   Qualifier: Diagnosis of  By: [name] MD, Wadie Mattie    . Dyslipidemia 07/02/2012  . 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 FACIAL RASH 11/17/2008   Qualifier: Diagnosis of  By: [name] MD, Keirstan Iannello    . GERD 07/20/2009   Qualifier: Diagnosis of  By: [name] MD, Murle Hellstrom    . GERD (gastroesophageal reflux disease)   . HIV disease ([place])   . Obesity (BMI 30.0-34.9) 04/20/2016  . PEDICULOSIS PUBIS (PUBIC LOUSE) 06/20/2006   Annotation: 4/06 Qualifier: Diagnosis of  By: [name] MD, Dylyn Mclaren    . Poor dentition 09/21/2016  . 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 Pulmonary  nodule   . SINUSITIS, CHRONIC 02/18/2009   Qualifier: Diagnosis of  By: Tommy Medal MD, Roderic Scarce    . Urinary frequency 04/06/2009   Qualifier: Diagnosis of  By: [name] MD, Talesha Ellithorpe      Past Surgical History:  Procedure Laterality Date  . THROAT SURGERY    . UMBILICAL HERNIA REPAIR      Family History  Problem Relation Age of Onset  . Headache Mother   . Hypertension Mother   . Heart attack Father 60  . Sudden Cardiac Death Neg Hx   . Colon cancer Neg Hx   . Liver disease Neg Hx   . Esophageal cancer Neg Hx   . Rectal cancer Neg Hx   . Stomach cancer Neg Hx     Social History   Tobacco Use  . Smoking status: Never Smoker  . Smokeless tobacco: Never Used  Substance Use Topics  . Alcohol use: No    Alcohol/week: 0.0 standard drinks  . Drug use: No    Prior to Admission medications   Medication Sig Start Date End Date Taking? Authorizing Provider  bictegravir-emtricitabine-tenofovir AF (BIKTARVY) 50-200-25 MG TABS tablet Take 1 tablet by mouth daily. 05/08/17   Michel Bickers, MD  predniSONE (DELTASONE) 10 MG tablet Take 4 pills one time a day for 1 week, take  3 pills daily for 1 week, 2 pills daily for 1 week, and then once daily 02/05/18   Gatha Mayer, MD    Allergies Patient has no known allergies.   REVIEW OF SYSTEMS  Negative except as noted here or in the History of Present Illness.   PHYSICAL EXAMINATION  Initial Vital Signs Blood pressure (!) 131/93, pulse 78, temperature 98.5 F (36.9 C), temperature source Oral, resp. rate 18, height 5' 8"  (1.727 m), weight 93 kg, SpO2 100 %.  Examination General: Well-developed, well-nourished male in no acute distress; appearance consistent with age of record HENT: normocephalic; atraumatic Eyes: pupils equal, round and reactive to light; extraocular muscles intact Neck: supple Heart: regular rate and rhythm Lungs: clear to auscultation bilaterally Abdomen: soft; nondistended; nontender; bowel sounds  present Extremities: No deformity; pulses normal; tenderness and swelling of medial left ankle without erythema or warmth Neurologic: Awake, alert; motor function intact in all extremities and symmetric; no facial droop; deaf Skin: Warm and dry Psychiatric: Normal mood and affect   RESULTS  Summary of this visit's results, reviewed by myself:   EKG Interpretation  Date/Time:    Ventricular Rate:    PR Interval:    QRS Duration:   QT Interval:    QTC Calculation:   R Axis:     Text Interpretation:        Laboratory Studies: No results found for this or any previous visit (from the past 24 hour(s)). Imaging Studies: Dg Ankle Complete Left  Result Date: 02/10/2018 CLINICAL DATA:  LEFT foot pain and swelling since yesterday. No injury. EXAM: LEFT FOOT - COMPLETE 3+ VIEW; LEFT ANKLE COMPLETE - 3+ VIEW COMPARISON:  LEFT ankle December 22, 2016 FINDINGS: LEFT foot: There is no evidence of fracture or dislocation. There is no evidence of arthropathy or other focal bone abnormality. Subcentimeter linear calcification versus radiopaque foreign body projecting plantar great toe soft tissues. LEFT ankle: No fracture deformity nor dislocation. The ankle mortise appears congruent and the tibiofibular syndesmosis intact. No destructive bony lesions. Soft tissue swelling without subcutaneous gas or radiopaque foreign bodies. IMPRESSION: 1. No fracture deformity or dislocation. 2. Similar ankle soft tissue swelling. 3. Tiny calcification versus radiopaque foreign body plantar great toe soft tissues. Electronically Signed   By: Elon Alas M.D.   On: 02/10/2018 01:28   Dg Foot Complete Left  Result Date: 02/10/2018 CLINICAL DATA:  LEFT foot pain and swelling since yesterday. No injury. EXAM: LEFT FOOT - COMPLETE 3+ VIEW; LEFT ANKLE COMPLETE - 3+ VIEW COMPARISON:  LEFT ankle December 22, 2016 FINDINGS: LEFT foot: There is no evidence of fracture or dislocation. There is no evidence of arthropathy  or other focal bone abnormality. Subcentimeter linear calcification versus radiopaque foreign body projecting plantar great toe soft tissues. LEFT ankle: No fracture deformity nor dislocation. The ankle mortise appears congruent and the tibiofibular syndesmosis intact. No destructive bony lesions. Soft tissue swelling without subcutaneous gas or radiopaque foreign bodies. IMPRESSION: 1. No fracture deformity or dislocation. 2. Similar ankle soft tissue swelling. 3. Tiny calcification versus radiopaque foreign body plantar great toe soft tissues. Electronically Signed   By: Elon Alas M.D.   On: 02/10/2018 01:28    ED COURSE and MDM  Nursing notes and initial vitals signs, including pulse oximetry, reviewed.  Vitals:   02/09/18 1954 02/09/18 1959  BP:  (!) 131/93  Pulse:  78  Resp:  18  Temp:  98.5 F (36.9 C)  TempSrc:  Oral  SpO2:  100%  Weight: 93 kg   Height: 5' 8"  (1.727 m)    The patient's acute pain may represent gout, nonspecific arthritis or soft tissue inflammation.  We will treat with an NSAID and mobilization.  PROCEDURES    ED DIAGNOSES     ICD-10-CM   1. Acute left ankle pain M25.572        Shanon Rosser, MD 02/10/18 [phone]

## 2018-02-11 ENCOUNTER — Telehealth: Payer: Self-pay | Admitting: Internal Medicine

## 2018-02-11 NOTE — Telephone Encounter (Signed)
Pt called in and wanting to speak with the nurse about medication that is needing to be prescribe. Pt did not mention the name of the prescription.

## 2018-02-11 NOTE — Telephone Encounter (Signed)
I left another message

## 2018-02-11 NOTE — Telephone Encounter (Signed)
See pathology results for details. Patient notified via relay service of the pathology results and recommendations We scheduled ov for 03/15/18 3:15

## 2018-02-12 ENCOUNTER — Other Ambulatory Visit: Payer: Self-pay

## 2018-02-12 ENCOUNTER — Telehealth: Payer: Self-pay | Admitting: Internal Medicine

## 2018-02-12 MED ORDER — PREDNISONE 10 MG PO TABS: 10.0000 mg | ORAL_TABLET | Freq: Every day | ORAL | 1 refills | Status: DC

## 2018-02-12 NOTE — Telephone Encounter (Signed)
Pt called in and advised that he has picked up his prescription from [facility] and it dosen't make sense of the instructions. He stated that it says take 4 tabs 7 days, 3 tabs 7 days, 2 tabs 7days. He stated that he dont understand how to take it.

## 2018-02-12 NOTE — Telephone Encounter (Signed)
Patient notified All questions answered

## 2018-02-25 ENCOUNTER — Ambulatory Visit: Payer: Medicare Other | Admitting: Family Medicine

## 2018-03-01 ENCOUNTER — Ambulatory Visit: Payer: Medicare Other | Admitting: Family Medicine

## 2018-03-13 ENCOUNTER — Encounter: Payer: Self-pay | Admitting: Internal Medicine

## 2018-03-13 ENCOUNTER — Other Ambulatory Visit (INDEPENDENT_AMBULATORY_CARE_PROVIDER_SITE_OTHER): Payer: Medicare Other

## 2018-03-13 ENCOUNTER — Ambulatory Visit (INDEPENDENT_AMBULATORY_CARE_PROVIDER_SITE_OTHER): Payer: Medicare Other | Admitting: Internal Medicine

## 2018-03-13 DIAGNOSIS — K51311 Ulcerative (chronic) rectosigmoiditis with rectal bleeding: Secondary | ICD-10-CM

## 2018-03-13 HISTORY — DX: Ulcerative (chronic) rectosigmoiditis with rectal bleeding: K51.311

## 2018-03-13 LAB — CBC WITH DIFFERENTIAL/PLATELET
Basophils Absolute: 0 10*3/uL (ref 0.0–0.1)
Basophils Relative: 0.3 % (ref 0.0–3.0)
EOS ABS: 0 10*3/uL (ref 0.0–0.7)
EOS PCT: 0.1 % (ref 0.0–5.0)
HCT: 45.2 % (ref 39.0–52.0)
Hemoglobin: 15.5 g/dL (ref 13.0–17.0)
LYMPHS ABS: 2.2 10*3/uL (ref 0.7–4.0)
Lymphocytes Relative: 20.8 % (ref 12.0–46.0)
MCHC: 34.2 g/dL (ref 30.0–36.0)
MCV: 91.5 fl (ref 78.0–100.0)
MONO ABS: 0.3 10*3/uL (ref 0.1–1.0)
Monocytes Relative: 3 % (ref 3.0–12.0)
NEUTROS PCT: 75.8 % (ref 43.0–77.0)
Neutro Abs: 8 10*3/uL — ABNORMAL HIGH (ref 1.4–7.7)
PLATELETS: 290 10*3/uL (ref 150.0–400.0)
RBC: 4.94 Mil/uL (ref 4.22–5.81)
RDW: 14.1 % (ref 11.5–15.5)
WBC: 10.5 10*3/uL (ref 4.0–10.5)

## 2018-03-13 MED ORDER — HYDROCORTISONE ACE-PRAMOXINE 1-1 % RE CREA: 1.0000 "application " | TOPICAL_CREAM | Freq: Two times a day (BID) | RECTAL | 3 refills | Status: DC

## 2018-03-13 NOTE — Progress Notes (Signed)
Blood count is normal released by my chart

## 2018-03-13 NOTE — Patient Instructions (Addendum)
  Your provider has requested that you go to the basement level for lab work before leaving today. Press "B" on the elevator. The lab is located at the first door on the left as you exit the elevator. CBC/diff  Take one prednisone daily for the next 10 days and then one every other day until your finished with the prednisone you have.  Stop anal intercourse, do not receive.   We have sent the following medications to your pharmacy for you to pick up at your convenience: proto cream  Follow up with Dr Carlean Purl in a month.    I appreciate the opportunity to care for you. Silvano Rusk, MD, [facility]

## 2018-03-13 NOTE — Progress Notes (Signed)
Matthew Fitzpatrick 38 y.o. 23-Apr-1980 [id number]  Assessment & Plan:   Encounter Diagnosis  Name Primary?  . Ulcerative proctosigmoiditis, with rectal bleeding ([name])     I am going to try topical steroids though his insurance limits at the 1% cream.  I have again asked him to refrain from receptive anal intercourse.  He will taper his prednisone 10 mg daily for 10 days and then 10 mg every other day until he is finished.  I cannot tell if the prednisone did not work or if it was not effective in the setting of continued anal receptive intercourse.  CBC was checked and is normal.  I will see him back in about a month.  Subjective:   Chief Complaint: Ulcerative proctitis  HPI The patient is seen with the assistance of a sign language interpreter.  I had prescribed prednisone after diagnosed him with ulcerative proctitis/proctosigmoiditis at colonoscopy in December.  I do not think he took it the way I prescribed it but he did take it, and is currently taking 20 mg qd.  He is still participating in receptive anal intercourse.  He is asking about a medication he saw advertised on TV to help him lose weight.  I explained that that is not something I would prescribe.  No Known Allergies Current Meds  Medication Sig  . bictegravir-emtricitabine-tenofovir AF (BIKTARVY) 50-200-25 MG TABS tablet Take 1 tablet by mouth daily.  . naproxen (NAPROSYN) 375 MG tablet Take 1 tablet twice daily as needed for ankle pain.  . predniSONE (DELTASONE) 10 MG tablet Take 1 tablet (10 mg total) by mouth daily with breakfast. Please take  40 mg by mouth daily for 7 days 30 mg daily for 7 days 20 mg for 7 days Then stay on 10 mg daily until directed differently   Past Medical History:  Diagnosis Date  . Acute conjunctivitis 02/12/2014  . Acute gastroenteritis 08/12/2015  . ANXIETY 03/02/2006   Qualifier: Diagnosis of  By: [name] MD, [name]    . Bronchitis 01/26/2011  . CAP (community acquired  pneumonia)   . CHICKENPOX, HX OF 06/20/2006   Annotation: age 87 Qualifier: Diagnosis of  By: [name] MD, Jenny Reichmann    . COUGH, CHRONIC 12/23/2008   Qualifier: Diagnosis of  By: [name] MD, [name]    . Deaf   . 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 DEPRESSION 03/02/2006   Qualifier: Diagnosis of  By: [name] MD, [name]    . Dyslipidemia 07/02/2012  . 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 FACIAL RASH 11/17/2008   Qualifier: Diagnosis of  By: [name] MD, [name]    . GERD 07/20/2009   Qualifier: Diagnosis of  By: [name] MD, [name]    . GERD (gastroesophageal reflux disease)   . HIV disease ([place])   . Obesity (BMI 30.0-34.9) 04/20/2016  . PEDICULOSIS PUBIS (PUBIC LOUSE) 06/20/2006   Annotation: 4/06 Qualifier: Diagnosis of  By: [name] MD, [name]    . Poor dentition 09/21/2016  . 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 Pulmonary nodule   . SINUSITIS, CHRONIC 02/18/2009   Qualifier: Diagnosis of  By: Tommy Medal MD, Roderic Scarce    . Urinary frequency 04/06/2009   Qualifier: Diagnosis of  By: [name] MD, Jenny Reichmann     Past Surgical History:  Procedure Laterality Date  . colonoscopy    . THROAT SURGERY    . UMBILICAL HERNIA REPAIR     Social History   Social History Narrative   Lives alone   Mom lives with his sister in [place]   No children   Works in Pensions consultant in [place]   Enjoys working on [employer]   No pets   family history includes Headache in his mother; Heart attack (age of onset: 32) in his father; Hypertension in his mother.   Review of Systems As above  Objective:   Physical Exam BP 134/90   Pulse 88   Ht 5' 8"  (1.727 m)   Wt 214 lb 12.8 oz (97.4 kg)   BMI 32.66 kg/m  Eyes anicteric Abdomen soft and nontender Rectal exam shows a gaping anus anoscopy is performed showing beefy  red mucosa with mucoid discharge

## 2018-03-20 ENCOUNTER — Telehealth: Payer: Self-pay | Admitting: Internal Medicine

## 2018-03-20 MED ORDER — PREDNISONE 10 MG PO TABS: 10.0000 mg | ORAL_TABLET | Freq: Every day | ORAL | 0 refills | Status: DC

## 2018-03-20 NOTE — Telephone Encounter (Signed)
I called the patient and we talked through a call service. The lady ask him questions and she told me the answers. He misplaced his prednisone bottle the day after his visit here. He had #17  Pills. Re-sent to the pharmacy which I confirmed during the call.

## 2018-03-28 ENCOUNTER — Ambulatory Visit (INDEPENDENT_AMBULATORY_CARE_PROVIDER_SITE_OTHER): Payer: Medicare Other | Admitting: Internal Medicine

## 2018-03-28 ENCOUNTER — Other Ambulatory Visit (HOSPITAL_COMMUNITY)
Admission: RE | Admit: 2018-03-28 | Discharge: 2018-03-28 | Disposition: A | Discharge status: Home / Self Care | Payer: Medicare Other | Source: Ambulatory Visit | Attending: Internal Medicine | Admitting: Internal Medicine

## 2018-03-28 DIAGNOSIS — B20 Human immunodeficiency virus [HIV] disease: Secondary | ICD-10-CM

## 2018-03-28 DIAGNOSIS — K51311 Ulcerative (chronic) rectosigmoiditis with rectal bleeding: Secondary | ICD-10-CM

## 2018-03-28 DIAGNOSIS — F3342 Major depressive disorder, recurrent, in full remission: Secondary | ICD-10-CM | POA: Diagnosis not present

## 2018-03-28 NOTE — Assessment & Plan Note (Signed)
His depression and anxiety are in remission.

## 2018-03-28 NOTE — Assessment & Plan Note (Signed)
I will check all sites (urine, pharynx and rectum) for GC and chlamydia.  He will continue steroids and follow-up with Dr. Carlean Purl.  Encouraged him again, as to Dr. Carlean Purl to avoid receptive anal intercourse.

## 2018-03-28 NOTE — Progress Notes (Signed)
Patient Active Problem List   Diagnosis Date Noted  . Human immunodeficiency virus (HIV) disease ([place]) 03/02/2006    Priority: High  . 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 Atypical chest pain 04/27/2017  . Abdominal pain 03/27/2017  . Palpitations 10/19/2016  . Dyspnea on exertion 10/19/2016  . Poor dentition 09/21/2016  . Pulmonary nodule 08/17/2016  . Obesity (BMI 30.0-34.9) 04/20/2016  . HIV (human immunodeficiency virus infection) ([place]) 04/09/2015  . Diarrhea 07/02/2012  . Dyslipidemia 07/02/2012  . GERD (gastroesophageal reflux disease) 06/28/2010  . 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 ANXIETY 03/02/2006  . Depression 03/02/2006  . ALLERGIC RHINITIS 03/02/2006    Patient's Medications  New Prescriptions   No medications on file  Previous Medications   BICTEGRAVIR-EMTRICITABINE-TENOFOVIR AF (BIKTARVY) 50-200-25 MG TABS TABLET    Take 1 tablet by mouth daily.   NAPROXEN (NAPROSYN) 375 MG TABLET    Take 1 tablet twice daily as needed for ankle pain.   PRAMOXINE-HYDROCORTISONE (PROCTOCREAM-HC) 1-1 % RECTAL CREAM    Place 1 application rectally 2 (two) times daily.   PREDNISONE (DELTASONE) 10 MG TABLET    Take 1 tablet (10 mg total) by mouth daily with breakfast. Please take one tablet daily for 10 days and then take one tablet every other day.  Modified Medications   No medications on file  Discontinued Medications   No medications on file    Subjective: Matthew Fitzpatrick is in for his routine HIV follow-up visit.  He was recently seen by Dr. Michelene Heady for rectal bleeding and underwent colonoscopy revealing rectosigmoid ulceration.  Biopsy revealed active colitis.  Testing for HSV, CMV and syphilis were negative.  He has been on oral and topical steroids with some improvement.  He has continued to have intermittent receptive anal intercourse.  He is concerned that [facility] says that they  cannot deliver his Biktarvy on weekends anymore but this has not caused him to miss any doses.  He denies feeling anxious or depressed.  Review of Systems: Review of Systems  Constitutional: Negative for chills, diaphoresis, fever, malaise/fatigue and weight loss.  HENT: Negative for sore throat.   Respiratory: Negative for cough, sputum production and shortness of breath.   Cardiovascular: Negative for chest pain.  Gastrointestinal: Positive for blood in stool and diarrhea. Negative for abdominal pain, constipation, heartburn, nausea and vomiting.  Genitourinary: Negative for dysuria and frequency.  Musculoskeletal: Negative for joint pain and myalgias.  Skin: Negative for rash.  Neurological: Negative for dizziness and headaches.  Psychiatric/Behavioral: Negative for depression and substance abuse. The patient is not nervous/anxious.     Past Medical History:  Diagnosis Date  . Acute conjunctivitis 02/12/2014  . Acute gastroenteritis 08/12/2015  . ANXIETY 03/02/2006   Qualifier: Diagnosis of  By: [name] MD, Kori Colin    . Bronchitis 01/26/2011  . CAP (community acquired pneumonia)   . CHICKENPOX, HX OF 06/20/2006   Annotation: age 38 Qualifier: Diagnosis of  By: [name] MD, Jenny Reichmann    . COUGH, CHRONIC 12/23/2008   Qualifier: Diagnosis of  By: [name] MD, Yarethzi Branan    . Deaf   . DEAFNESS, 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   Qualifier: Diagnosis of  By: [name] MD, Carlissa Pesola    . DEPRESSION 03/02/2006   Qualifier: Diagnosis of  By: [name] MD, Ommie Degeorge    . Dyslipidemia 07/02/2012  . 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  FACIAL RASH 11/17/2008   Qualifier: Diagnosis of  By: [name] MD, Shantale Holtmeyer    . GERD 07/20/2009   Qualifier: Diagnosis of  By: [name] MD, Kinser Fellman    . GERD (gastroesophageal reflux disease)   . HIV disease ([place])   . Obesity (BMI 30.0-34.9) 04/20/2016  . PEDICULOSIS PUBIS (PUBIC LOUSE) 06/20/2006   Annotation: 4/06 Qualifier: Diagnosis of  By:  [name] MD, Aroura Vasudevan    . Poor dentition 09/21/2016  . 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 Pulmonary nodule   . SINUSITIS, CHRONIC 02/18/2009   Qualifier: Diagnosis of  By: Tommy Medal MD, Roderic Scarce    . 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 Urinary frequency 04/06/2009   Qualifier: Diagnosis of  By: [name] MD, Kamrin Sibley      Social History   Tobacco Use  . Smoking status: Never Smoker  . Smokeless tobacco: Never Used  Substance Use Topics  . Alcohol use: No    Alcohol/week: 0.0 standard drinks  . Drug use: No    Family History  Problem Relation Age of Onset  . Headache Mother   . Hypertension Mother   . Heart attack Father 12  . Sudden Cardiac Death Neg Hx   . Colon cancer Neg Hx   . Liver disease Neg Hx   . Esophageal cancer Neg Hx   . Rectal cancer Neg Hx   . Stomach cancer Neg Hx     No Known Allergies  Health Maintenance  Topic Date Due  . TETANUS/TDAP  10/26/1998  . INFLUENZA VACCINE  09/27/2017  . HIV Screening  Completed    Objective:  Vitals:   03/28/18 0946  BP: 126/81  Pulse: 78  Temp: 98.3 F (36.8 C)  Weight: 219 lb (99.3 kg)   Body mass index is 33.3 kg/m.  Physical Exam Constitutional:      Comments: He is calm and in no distress.  He was interviewed with the help of the video interpreter.  HENT:     Mouth/Throat:     Pharynx: No oropharyngeal exudate.  Eyes:     Conjunctiva/sclera: Conjunctivae normal.  Cardiovascular:     Rate and Rhythm: Normal rate and regular rhythm.     Heart sounds: No murmur.  Pulmonary:     Effort: Pulmonary effort is normal.     Breath sounds: Normal breath sounds.  Abdominal:     Palpations: Abdomen is soft. There is no mass.     Tenderness: There is no abdominal tenderness.  Musculoskeletal: Normal range of motion.  Skin:    Findings: No rash.  Neurological:     Mental Status: He is alert and oriented to  person, place, and time.     Lab Results Lab Results  Component Value Date   WBC 10.5 03/13/2018   HGB 15.5 03/13/2018   HCT 45.2 03/13/2018   MCV 91.5 03/13/2018   PLT 290.0 03/13/2018    Lab Results  Component Value Date   CREATININE 0.96 08/13/2017   BUN 12 08/13/2017   NA 137 08/13/2017   K 4.3 08/13/2017   CL 101 08/13/2017   CO2 30 08/13/2017    Lab Results  Component Value Date   ALT 43 08/13/2017   AST 41 (H) 08/13/2017   ALKPHOS 134 (H) 08/13/2017   BILITOT 0.5 08/13/2017    Lab Results  Component Value Date   CHOL 218 (H) 03/02/2016  HDL 45 03/02/2016   LDLCALC 145 (H) 03/02/2016   TRIG 141 03/02/2016   CHOLHDL 4.8 03/02/2016   Lab Results  Component Value Date   LABRPR NON REAC 09/21/2016   HIV 1 RNA Quant (copies/mL)  Date Value  09/25/2017 <20 NOT DETECTED  03/12/2017 <20 DETECTED (A)  09/21/2016 <20 DETECTED (A)   CD4 T Cell Abs (/uL)  Date Value  09/25/2017 950  03/12/2017 1,120  09/21/2016 1,250     Problem List Items Addressed This Visit      High   Human immunodeficiency virus (HIV) disease ([place])    He will continue Biktarvy and get blood work today.  I will have him follow-up in 6 months.      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  He will continue steroids and follow-up with Dr. Carlean Purl.  Encouraged him again, as to Dr. Carlean Purl to avoid receptive anal intercourse.      Relevant Orders   Urine cytology ancillary only   Cytology (oral, anal, urethral) ancillary only   Cytology (oral, anal, urethral) ancillary only   Depression    His depression and anxiety are in remission.           Michel Bickers, MD [facility] [place]  [facility] [phone] pager   [phone] cell 03/28/2018, 10:04 AM

## 2018-03-28 NOTE — Assessment & Plan Note (Signed)
He will continue Biktarvy and get blood work today.  I will have him follow-up in 6 months.

## 2018-03-29 ENCOUNTER — Telehealth: Payer: Self-pay

## 2018-03-29 ENCOUNTER — Encounter: Payer: Self-pay | Admitting: Internal Medicine

## 2018-03-29 DIAGNOSIS — A749 Chlamydial infection, unspecified: Secondary | ICD-10-CM | POA: Insufficient documentation

## 2018-03-29 DIAGNOSIS — A539 Syphilis, unspecified: Secondary | ICD-10-CM | POA: Insufficient documentation

## 2018-03-29 DIAGNOSIS — Z8619 Personal history of other infectious and parasitic diseases: Secondary | ICD-10-CM | POA: Insufficient documentation

## 2018-03-29 HISTORY — DX: Syphilis, unspecified: A53.9

## 2018-03-29 HISTORY — DX: Chlamydial infection, unspecified: A74.9

## 2018-03-29 LAB — CYTOLOGY, (ORAL, ANAL, URETHRAL) ANCILLARY ONLY
Chlamydia: NEGATIVE
Chlamydia: POSITIVE — AB
Neisseria Gonorrhea: NEGATIVE
Neisseria Gonorrhea: NEGATIVE

## 2018-03-29 LAB — T-HELPER CELL (CD4) - (RCID CLINIC ONLY)
CD4 % Helper T Cell: 19 % — ABNORMAL LOW (ref 33–55)
CD4 T Cell Abs: 560 /uL (ref 400–2700)

## 2018-03-29 LAB — URINE CYTOLOGY ANCILLARY ONLY
Chlamydia: NEGATIVE
Neisseria Gonorrhea: NEGATIVE

## 2018-03-29 NOTE — Telephone Encounter (Signed)
Left message via interpreter: call office today before pm or Monday morning 04-01-2018  Laverle Patter, RN

## 2018-03-29 NOTE — Telephone Encounter (Signed)
-----   Message from Michel Bickers, MD sent at 03/29/2018 12:40 PM EST ----- Matthew Fitzpatrick now has syphilis and chlamydia.  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

## 2018-04-01 LAB — COMPREHENSIVE METABOLIC PANEL
AG RATIO: 1 (calc) (ref 1.0–2.5)
ALT: 20 U/L (ref 9–46)
AST: 19 U/L (ref 10–40)
Albumin: 3.9 g/dL (ref 3.6–5.1)
Alkaline phosphatase (APISO): 100 U/L (ref 40–115)
BILIRUBIN TOTAL: 0.2 mg/dL (ref 0.2–1.2)
BUN: 10 mg/dL (ref 7–25)
CALCIUM: 9.6 mg/dL (ref 8.6–10.3)
CO2: 28 mmol/L (ref 20–32)
Chloride: 104 mmol/L (ref 98–110)
Creat: 0.93 mg/dL (ref 0.60–1.35)
Globulin: 4 g/dL (calc) — ABNORMAL HIGH (ref 1.9–3.7)
Glucose, Bld: 93 mg/dL (ref 65–99)
Potassium: 4.4 mmol/L (ref 3.5–5.3)
Sodium: 139 mmol/L (ref 135–146)
Total Protein: 7.9 g/dL (ref 6.1–8.1)

## 2018-04-01 LAB — HIV-1 RNA QUANT-NO REFLEX-BLD
HIV 1 RNA Quant: <20 copies/mL — AB
HIV-1 RNA Quant, Log: <1.3 Log copies/mL — AB

## 2018-04-01 LAB — CBC
HCT: 41.8 % (ref 38.5–50.0)
Hemoglobin: 14.4 g/dL (ref 13.2–17.1)
MCH: 30.6 pg (ref 27.0–33.0)
MCHC: 34.4 g/dL (ref 32.0–36.0)
MCV: 88.9 fL (ref 80.0–100.0)
MPV: 10.2 fL (ref 7.5–12.5)
PLATELETS: 347 10*3/uL (ref 140–400)
RBC: 4.7 10*6/uL (ref 4.20–5.80)
RDW: 12.9 % (ref 11.0–15.0)
WBC: 8 10*3/uL (ref 3.8–10.8)

## 2018-04-01 LAB — RPR TITER

## 2018-04-01 LAB — FLUORESCENT TREPONEMAL AB(FTA)-IGG-BLD: Fluorescent Treponemal ABS: REACTIVE — AB

## 2018-04-01 LAB — RPR: RPR: REACTIVE — AB

## 2018-04-02 NOTE — Telephone Encounter (Addendum)
Left message via sign language  interpreter to call the office back left the call back number.    **Patient needs to schedule appointment for Syphilis and Chlamydia treatment, Pricilla Riffle RN

## 2018-04-03 ENCOUNTER — Ambulatory Visit: Payer: Medicare Other

## 2018-04-03 NOTE — Telephone Encounter (Addendum)
Called Matthew Fitzpatrick at [facility] to inform him staff has been unable to contact patient to come in for treatment of Syphilis.  Patient's information faxed to [facility] so they can contact patient for treatment.  Gwenlyn Perking states they will get in contact with patient. Pricilla Riffle RN

## 2018-04-03 NOTE — Telephone Encounter (Signed)
Patient returned call via sign language interpreter. Patient informed of recent results of +syphilis and chlamydia and per Dr. [name] will need to be treated with benzathine penicillin 2,400,000 units IM weekly x3 doses and azithromycin 1 g by mouth. LPN scheduled nurse visit for today at [facility], LPN

## 2018-04-03 NOTE — Telephone Encounter (Signed)
Attempted to call [name] on both numbers provided.  Left a voicemail for him to call the office back through the interpreter. Pricilla Riffle RN

## 2018-04-05 NOTE — Telephone Encounter (Signed)
Patient called back today to schedule an appointment for Financial counseling to renew SPAP.  Scheduled him an appointment for SPAP renewal however informed him he is also positive for Chlamydia and Syphilis and needs to come in for treatment.  Scheduled patient appointments for STI treatment as well. Informed patient to refrain from all sexual activity until treatment is complete and to inform recent partners to be tested and treated at the health department. Pricilla Riffle RN

## 2018-04-08 ENCOUNTER — Encounter: Payer: Self-pay | Admitting: Internal Medicine

## 2018-04-08 ENCOUNTER — Ambulatory Visit (INDEPENDENT_AMBULATORY_CARE_PROVIDER_SITE_OTHER): Payer: Medicare Other | Admitting: Internal Medicine

## 2018-04-08 VITALS — BP 118/78 | HR 74 | Ht 68.0 in | Wt 219.0 lb

## 2018-04-08 DIAGNOSIS — K6289 Other specified diseases of anus and rectum: Secondary | ICD-10-CM | POA: Diagnosis not present

## 2018-04-08 DIAGNOSIS — A539 Syphilis, unspecified: Secondary | ICD-10-CM

## 2018-04-08 DIAGNOSIS — A749 Chlamydial infection, unspecified: Secondary | ICD-10-CM

## 2018-04-08 NOTE — Patient Instructions (Addendum)
  Please follow up with Dr Carlean Purl on March 27th at 1:30pm.    I appreciate the opportunity to care for you. Silvano Rusk, MD, [facility]

## 2018-04-08 NOTE — Progress Notes (Signed)
Matthew Fitzpatrick 38 y.o. 02/14/1981 [id number]  Assessment & Plan:   Encounter Diagnoses  Name Primary?  . Proctitis Yes  . Chlamydia infection   . Syphilis     So perhaps this proctitis is infectious after all. He has been non-compliant with GI Tx. Is aware that he has appointment tomorrow for Tx of Syphilis and Chlamydia.  No GI Tx now and reassess in late March. Bxs did show inflammation but were not diagnostic of IBD.  Medication adherence difficulties make me unenthusiastic about treating him with biologics or other immunosuppressants unless I have more convincing evidence of IBD. Hopefully Tx syphilis and chlamydia will take care of the proctitis.    Subjective:   Chief Complaint: f/u proctitis  HPI Matthew Fitzpatrick here with sign language interpreter - last seen about 1 month ago and rxed to continue prednisone and try topical steroids. Did not do that - did not feel like driving to pharmacy. Denies intercourse since then.  Says less bleeding from rectum.  Has tested + for Syphilis and Chlamydia.(blood) and oral/anal/genital swabs) - reviewed ID labs and clinic notes   No Known Allergies Current Meds  Medication Sig  . bictegravir-emtricitabine-tenofovir AF (BIKTARVY) 50-200-25 MG TABS tablet Take 1 tablet by mouth daily.   Past Medical History:  Diagnosis Date  . Acute conjunctivitis 02/12/2014  . Acute gastroenteritis 08/12/2015  . ANXIETY 03/02/2006   Qualifier: Diagnosis of  By: [name] MD, [name]    . Bronchitis 01/26/2011  . CAP (community acquired pneumonia)   . CHICKENPOX, HX OF 06/20/2006   Annotation: age 73 Qualifier: Diagnosis of  By: [name] MD, Jenny Reichmann    . Chlamydia infection 03/29/2018  . COUGH, CHRONIC 12/23/2008   Qualifier: Diagnosis of  By: [name] MD, [name]    . Deaf   . 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 DEPRESSION 03/02/2006   Qualifier: Diagnosis of  By: [name] MD, [name]    .  Dyslipidemia 07/02/2012  . 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 FACIAL RASH 11/17/2008   Qualifier: Diagnosis of  By: [name] MD, [name]    . GERD 07/20/2009   Qualifier: Diagnosis of  By: [name] MD, [name]    . GERD (gastroesophageal reflux disease)   . HIV disease ([place])   . Obesity (BMI 30.0-34.9) 04/20/2016  . PEDICULOSIS PUBIS (PUBIC LOUSE) 06/20/2006   Annotation: 4/06 Qualifier: Diagnosis of  By: [name] MD, [name]    . Poor dentition 09/21/2016  . 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 Pulmonary nodule   . SINUSITIS, CHRONIC 02/18/2009   Qualifier: Diagnosis of  By: Tommy Medal MD, Roderic Scarce    . 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  . 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 Urinary frequency 04/06/2009   Qualifier: Diagnosis of  By: [name] MD, [name]     Past Surgical History:  Procedure Laterality Date  . colonoscopy    . THROAT SURGERY    . 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   Review of Systems See HPI  Objective:   Physical Exam ,vs NAD Inspection of anoderm - NL - no palpable lesions either

## 2018-04-09 ENCOUNTER — Ambulatory Visit: Payer: Medicare Other

## 2018-04-09 ENCOUNTER — Ambulatory Visit (INDEPENDENT_AMBULATORY_CARE_PROVIDER_SITE_OTHER): Payer: Medicare Other

## 2018-04-09 DIAGNOSIS — A64 Unspecified sexually transmitted disease: Secondary | ICD-10-CM | POA: Diagnosis not present

## 2018-04-09 DIAGNOSIS — A749 Chlamydial infection, unspecified: Secondary | ICD-10-CM

## 2018-04-09 DIAGNOSIS — A539 Syphilis, unspecified: Secondary | ICD-10-CM | POA: Diagnosis not present

## 2018-04-09 MED ORDER — AZITHROMYCIN 250 MG PO TABS
1000.0000 mg | ORAL_TABLET | Freq: Once | ORAL | Status: AC
  Administered 2018-04-09: 1000 mg via ORAL

## 2018-04-09 MED ORDER — PENICILLIN G BENZATHINE 1200000 UNIT/2ML IM SUSP
1.2000 10*6.[IU] | Freq: Once | INTRAMUSCULAR | Status: AC
  Administered 2018-04-09: 1.2 10*6.[IU] via INTRAMUSCULAR

## 2018-04-09 NOTE — Progress Notes (Signed)
Patient came in clinic today for STI treatment of syphilis and chlamydia. Patient arrived with sign language interpreter. Patient confirmed understanding  reason for appointment and verified NKA. Patient received benzathine penicillin 2,400,000 units IM  and azithromycin 1 g by mouth orders confirmed per Dr. [name]. Patient understands that he will need benzathine penicillin 2,400,000 units IM weekly x2 more doses. Patient offered and accepted condoms. Patient advised to use OTC medication for discomfort and apply warm compress to injection site if needed. Patient tolerated medication well and remained in the clinic to observe for any adverse reactions. No reactions noted. Patient informed of upcoming weekly appointments.  Eugenia Mcalpine, LPN

## 2018-04-16 ENCOUNTER — Telehealth: Payer: Self-pay

## 2018-04-16 NOTE — Telephone Encounter (Signed)
Corene Cornea from [facility] Department called office today to see if patient has started treatment for syphillis. Informed Corene Cornea that patient received first dose on 2/11 by LPN. Patient is scheduled for two more doses one week apart. Corene Cornea verbalized understanding, and would like for our office to remind patient that he is scheduled to meet Corene Cornea at [place] at 2 pm.  Corene Cornea, Health Department [place], [place]

## 2018-04-17 ENCOUNTER — Ambulatory Visit (INDEPENDENT_AMBULATORY_CARE_PROVIDER_SITE_OTHER): Payer: Medicare Other | Admitting: *Deleted

## 2018-04-17 ENCOUNTER — Encounter: Payer: Self-pay | Admitting: Internal Medicine

## 2018-04-17 ENCOUNTER — Other Ambulatory Visit: Payer: Self-pay | Admitting: *Deleted

## 2018-04-17 DIAGNOSIS — A539 Syphilis, unspecified: Secondary | ICD-10-CM

## 2018-04-17 MED ORDER — PENICILLIN G BENZATHINE 1200000 UNIT/2ML IM SUSP
1.2000 10*6.[IU] | Freq: Once | INTRAMUSCULAR | Status: AC
  Administered 2018-04-17: 1.2 10*6.[IU] via INTRAMUSCULAR

## 2018-04-17 NOTE — Progress Notes (Signed)
Patient here for injection #2 of a series of 3 bicillin injections, 2.4 million units.  Per chart, he is scheduled to see Corene Cornea at the health department 2pm today. RN confirmed this with Corene Cornea (also confirming patient's preferred name and need for American Sign Language interpreter - which will be present) and patient. RN confirmed his appointment here next week for the 3rd injection. Landis Gandy, RN

## 2018-04-18 ENCOUNTER — Telehealth: Payer: Self-pay

## 2018-04-18 NOTE — Telephone Encounter (Signed)
Patient left voicemail with our office stating that he had a missed call. Attempted to call patient back to inform him per chart review no one from our office had attempted to call him, and If  He had any questions/concerns feel free to call back. [place]

## 2018-04-23 ENCOUNTER — Ambulatory Visit (INDEPENDENT_AMBULATORY_CARE_PROVIDER_SITE_OTHER): Payer: Medicare Other | Admitting: Behavioral Health

## 2018-04-23 DIAGNOSIS — A64 Unspecified sexually transmitted disease: Secondary | ICD-10-CM

## 2018-04-23 DIAGNOSIS — A539 Syphilis, unspecified: Secondary | ICD-10-CM | POA: Diagnosis not present

## 2018-04-23 MED ORDER — PENICILLIN G BENZATHINE 1200000 UNIT/2ML IM SUSP
1.2000 10*6.[IU] | Freq: Once | INTRAMUSCULAR | Status: AC
  Administered 2018-04-23: 1.2 10*6.[IU] via INTRAMUSCULAR

## 2018-04-23 NOTE — Progress Notes (Signed)
Live sign language interpreter used to communicate with patient.  Patient came in today for treatment for Syphilis. Patient received Bicillin injections 3/3 today.  Patient tolerated well.  Offered condoms and he refused.  Informed him to inform recent partners to be tested an treated at the health department.  Also to refrain from sexual activity for 10 days after this last injection.  Patient stated he understood.   Pricilla Riffle RN

## 2018-04-30 ENCOUNTER — Other Ambulatory Visit: Payer: Self-pay | Admitting: Internal Medicine

## 2018-04-30 DIAGNOSIS — B20 Human immunodeficiency virus [HIV] disease: Secondary | ICD-10-CM

## 2018-05-22 ENCOUNTER — Encounter: Payer: Self-pay | Admitting: Internal Medicine

## 2018-05-22 NOTE — Telephone Encounter (Signed)
Error

## 2018-05-24 ENCOUNTER — Other Ambulatory Visit: Payer: Self-pay

## 2018-05-24 ENCOUNTER — Telehealth (INDEPENDENT_AMBULATORY_CARE_PROVIDER_SITE_OTHER): Payer: Medicare Other | Admitting: Internal Medicine

## 2018-05-24 DIAGNOSIS — K51311 Ulcerative (chronic) rectosigmoiditis with rectal bleeding: Secondary | ICD-10-CM

## 2018-05-24 NOTE — Assessment & Plan Note (Signed)
Symptoms and signs seem improved.  Could have chlamydia, syphilis, for which he has been treated.  Plan to reassess in person in May if Covid-19 restrictions lifted  May 4 or May 18 are days he can make it

## 2018-05-24 NOTE — Patient Instructions (Signed)
Judge Stall,  Glad to hear things are better.  When we are able to schedule May appointments we will call and see if we can schedule you for May 4 or May 18.  Take care,  Dr. Carlean Purl

## 2018-05-24 NOTE — Progress Notes (Signed)
    TELEHEALTH ENCOUNTER IN SETTING OF COVID-19 PANDEMIC - REQUESTED BY PATIENT SERVICE PROVIDED BY TELEMEDECINE - TYPE: hearing impaired phone - patient is deaf PATIENT LOCATION: Home PATIENT HAS CONSENTED TO TELEHEALTH VISIT PROVIDER LOCATION: OFFICE TIME SPENT ON CALL:10 minutes    Matthew Fitzpatrick 38 y.o. 29-Apr-1980 [id number]  Assessment & Plan:   Ulcerative proctosigmoiditis, with rectal bleeding ([facility]) Symptoms and signs seem improved.  Could have chlamydia, syphilis, for which he has been treated.  Plan to reassess in person in May if Covid-19 restrictions lifted  May 4 or May 18 are days he can make it     Subjective:   Chief Complaint: proctitis  HPI Matthew Fitzpatrick reports less rectal bleeding.No pain. Bleeding only with wiping after defecation. Has completed Tx for syphilis and chlamydia No Known Allergies  Current Outpatient Medications:  .  BIKTARVY 50-200-25 MG TABS tablet, TAKE 1 TABLET BY MOUTH EVERY DAY, Disp: 30 tablet, Rfl: 5  Past Medical History:  Diagnosis Date  . Acute conjunctivitis 02/12/2014  . Acute gastroenteritis 08/12/2015  . ANXIETY 03/02/2006   Qualifier: Diagnosis of  By: [name] MD, [name]    . Bronchitis 01/26/2011  . CAP (community acquired pneumonia)   . CHICKENPOX, HX OF 06/20/2006   Annotation: age 9 Qualifier: Diagnosis of  By: [name] MD, Jenny Reichmann    . Chlamydia infection 03/29/2018  . COUGH, CHRONIC 12/23/2008   Qualifier: Diagnosis of  By: [name] MD, [name]    . Deaf   . 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 DEPRESSION 03/02/2006   Qualifier: Diagnosis of  By: [name] MD, [name]    . Dyslipidemia 07/02/2012  . 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 FACIAL RASH 11/17/2008   Qualifier: Diagnosis of  By: [name] MD, [name]    . GERD 07/20/2009   Qualifier: Diagnosis of  By: [name] MD, [name]    . GERD (gastroesophageal reflux  disease)   . HIV disease ([place])   . Obesity (BMI 30.0-34.9) 04/20/2016  . PEDICULOSIS PUBIS (PUBIC LOUSE) 06/20/2006   Annotation: 4/06 Qualifier: Diagnosis of  By: [name] MD, [name]    . Poor dentition 09/21/2016  . 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 Pulmonary nodule   . SINUSITIS, CHRONIC 02/18/2009   Qualifier: Diagnosis of  By: Tommy Medal MD, Roderic Scarce    . 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  . 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 Urinary frequency 04/06/2009   Qualifier: Diagnosis of  By: [name] MD, [name]     Past Surgical History:  Procedure Laterality Date  . colonoscopy    . THROAT SURGERY    . 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

## 2018-07-15 ENCOUNTER — Telehealth: Payer: Self-pay

## 2018-07-15 NOTE — Telephone Encounter (Signed)
Covid-19 travel screening questions  Have you traveled in the last 14 days? If yes where?  Do you now or have you had a fever in the last 14 days?  Do you have any respiratory symptoms of shortness of breath or cough now or in the last 14 days?  Do you have a medical history of Congestive Heart Failure?  Do you have a medical history of lung disease?  Do you have any family members or close contacts with diagnosed or suspected Covid-19?      Had to leave a message for him to call us first thing in the AM to be screened as his appointment here is 9:00AM. This message went thru the interpretor.

## 2018-07-16 ENCOUNTER — Ambulatory Visit (INDEPENDENT_AMBULATORY_CARE_PROVIDER_SITE_OTHER): Payer: Medicare Other | Admitting: Internal Medicine

## 2018-07-16 ENCOUNTER — Encounter: Payer: Self-pay | Admitting: Internal Medicine

## 2018-07-16 ENCOUNTER — Other Ambulatory Visit: Payer: Self-pay

## 2018-07-16 VITALS — BP 120/80 | HR 73 | Temp 98.2°F | Ht 68.0 in | Wt 231.0 lb

## 2018-07-16 DIAGNOSIS — K625 Hemorrhage of anus and rectum: Secondary | ICD-10-CM

## 2018-07-16 DIAGNOSIS — K6289 Other specified diseases of anus and rectum: Secondary | ICD-10-CM | POA: Diagnosis not present

## 2018-07-16 NOTE — Telephone Encounter (Signed)
The interpreter called in this AM and we did the screening ?'s. All were answered NO.

## 2018-07-16 NOTE — Patient Instructions (Signed)
You have been scheduled for a flexible sigmoidoscopy. Please follow the written instructions given to you at your visit today. If you use inhalers (even only as needed), please bring them with you on the day of your procedure.    I appreciate the opportunity to care for you. Silvano Rusk, MD, [facility]

## 2018-07-16 NOTE — Progress Notes (Signed)
Matthew Fitzpatrick 38 y.o. [birth year] [id number]  Assessment & Plan:   Encounter Diagnoses  Name Primary?  . Rectal bleeding Yes  . Proctitis     He seems a lot better.  He was treated for chlamydia and syphilis and I wonder if he did not have rectal chlamydia.  However given the persistent bleeding though it could be hemorrhoids I am not sure so we will do a flexible sigmoidoscopy.    Subjective:   Chief Complaint: Follow-up of proctitis and rectal bleeding  HPI Matthew Fitzpatrick seen with the aid of a sign language interpreter.  He still having some rectal bleeding at times.  It is much less than it used to be.  Denies anal receptive intercourse.  He was treated for chlamydia and syphilis earlier this year and completed treatment.  This was detected through serology (syphilis) and also swabs of the rectum pharynx and urine.  The results are not labeled 1 of 2 swabs were positive. No Known Allergies Current Meds  Medication Sig  . BIKTARVY 50-200-25 MG TABS tablet TAKE 1 TABLET BY MOUTH EVERY DAY   Past Medical History:  Diagnosis Date  . Acute conjunctivitis 02/12/2014  . Acute gastroenteritis 08/12/2015  . ANXIETY 03/02/2006   Qualifier: Diagnosis of  By: [name] MD, [name]    . Bronchitis 01/26/2011  . CAP (community acquired pneumonia)   . CHICKENPOX, HX OF 06/20/2006   Annotation: age 61 Qualifier: Diagnosis of  By: [name] MD, Jenny Reichmann    . Chlamydia infection 03/29/2018  . COUGH, CHRONIC 12/23/2008   Qualifier: Diagnosis of  By: [name] MD, [name]    . Deaf   . 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 DEPRESSION 03/02/2006   Qualifier: Diagnosis of  By: [name] MD, [name]    . Dyslipidemia 07/02/2012  . 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 FACIAL RASH 11/17/2008   Qualifier: Diagnosis of  By: [name] MD, [name]    . GERD 07/20/2009   Qualifier: Diagnosis of  By: [name] MD,  [name]    . GERD (gastroesophageal reflux disease)   . HIV disease ([place])   . Obesity (BMI 30.0-34.9) 04/20/2016  . PEDICULOSIS PUBIS (PUBIC LOUSE) 06/20/2006   Annotation: 4/06 Qualifier: Diagnosis of  By: [name] MD, [name]    . Poor dentition 09/21/2016  . 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 Pulmonary nodule   . SINUSITIS, CHRONIC 02/18/2009   Qualifier: Diagnosis of  By: Tommy Medal MD, Roderic Scarce    . 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  . 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 Urinary frequency 04/06/2009   Qualifier: Diagnosis of  By: [name] MD, [name]     Past Surgical History:  Procedure Laterality Date  . colonoscopy    . THROAT SURGERY    . 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   Review of Systems As above Objective:   Physical Exam  BP 120/80   Pulse 73   Temp 98.2 F (36.8 C)   Ht 5' 8"  (1.727 m)   Wt 231 lb (104.8 kg)   BMI 35.12 kg/m   Rectal - NL anoderm, nontender, npo mass Formed brown stool  Anoscopy - inflamed Gr 1-2 internal hemorrhoids all positions, limited distal rectal view looks ok

## 2018-08-13 ENCOUNTER — Other Ambulatory Visit: Payer: Self-pay | Admitting: *Deleted

## 2018-08-13 DIAGNOSIS — A539 Syphilis, unspecified: Secondary | ICD-10-CM

## 2018-08-13 DIAGNOSIS — B20 Human immunodeficiency virus [HIV] disease: Secondary | ICD-10-CM

## 2018-08-19 ENCOUNTER — Other Ambulatory Visit: Payer: Self-pay

## 2018-08-19 ENCOUNTER — Ambulatory Visit (INDEPENDENT_AMBULATORY_CARE_PROVIDER_SITE_OTHER): Payer: Medicare Other | Admitting: Family Medicine

## 2018-08-19 ENCOUNTER — Ambulatory Visit: Payer: Medicare Other | Admitting: *Deleted

## 2018-08-19 ENCOUNTER — Encounter: Payer: Self-pay | Admitting: Family Medicine

## 2018-08-19 VITALS — BP 125/85 | HR 82 | Ht 68.0 in | Wt 230.0 lb

## 2018-08-19 DIAGNOSIS — M25571 Pain in right ankle and joints of right foot: Secondary | ICD-10-CM

## 2018-08-19 DIAGNOSIS — M25572 Pain in left ankle and joints of left foot: Secondary | ICD-10-CM | POA: Diagnosis not present

## 2018-08-19 DIAGNOSIS — G8929 Other chronic pain: Secondary | ICD-10-CM | POA: Diagnosis not present

## 2018-08-19 MED ORDER — NAPROXEN 500 MG PO TBEC: 500.0000 mg | DELAYED_RELEASE_TABLET | Freq: Two times a day (BID) | ORAL | 1 refills | Status: DC

## 2018-08-19 NOTE — Patient Instructions (Addendum)
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 Ice the area 15 minutes at a time 3-4 times a day as needed. Naproxen 532m twice a day with food for pain and inflammation - typically take for 7-10 days then as needed. Do home exercises 3 sets of 10 once a day. Consider walking boot to help rest this. Consider physical therapy if not improving. Follow up with me in 6 weeks.

## 2018-08-20 ENCOUNTER — Encounter: Payer: Self-pay | Admitting: Family Medicine

## 2018-08-20 NOTE — Progress Notes (Signed)
PCP: [name], NP  Subjective:   HPI: Patient is a 38 y.o. male here for foot/ankle pain.  Interpreter present for visit. Patient reports since at least the end of last year he's had pain in anterior aspect of both ankles, left worse than right. Pain 7/10 on left, 3/10 on right, sharp. Worse with walking and standing. Tried new shoes and boots but not much benefit. Naproxen did help but he stopped taking this. No skin changes, numbness.  Past Medical History:  Diagnosis Date  . Acute conjunctivitis 02/12/2014  . Acute gastroenteritis 08/12/2015  . ANXIETY 03/02/2006   Qualifier: Diagnosis of  By: [name] MD, [name]    . Bronchitis 01/26/2011  . CAP (community acquired pneumonia)   . CHICKENPOX, HX OF 06/20/2006   Annotation: age 89 Qualifier: Diagnosis of  By: [name] MD, Jenny Reichmann    . Chlamydia infection 03/29/2018  . COUGH, CHRONIC 12/23/2008   Qualifier: Diagnosis of  By: [name] MD, [name]    . Deaf   . 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 DEPRESSION 03/02/2006   Qualifier: Diagnosis of  By: [name] MD, [name]    . Dyslipidemia 07/02/2012  . 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 FACIAL RASH 11/17/2008   Qualifier: Diagnosis of  By: [name] MD, [name]    . GERD 07/20/2009   Qualifier: Diagnosis of  By: [name] MD, [name]    . GERD (gastroesophageal reflux disease)   . HIV disease ([place])   . Obesity (BMI 30.0-34.9) 04/20/2016  . PEDICULOSIS PUBIS (PUBIC LOUSE) 06/20/2006   Annotation: 4/06 Qualifier: Diagnosis of  By: [name] MD, [name]    . Poor dentition 09/21/2016  . 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 Pulmonary nodule   . SINUSITIS, CHRONIC 02/18/2009   Qualifier: Diagnosis of  By: Tommy Medal MD, Roderic Scarce    . 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  . 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  Urinary frequency 04/06/2009   Qualifier: Diagnosis of  By: [name] MD, [name]      Current Outpatient Medications on File Prior to Visit  Medication Sig Dispense Refill  . BIKTARVY 50-200-25 MG TABS tablet TAKE 1 TABLET BY MOUTH EVERY DAY 30 tablet 5   No current facility-administered medications on file prior to visit.     Past Surgical History:  Procedure Laterality Date  . colonoscopy    . THROAT SURGERY    . UMBILICAL HERNIA REPAIR      No Known Allergies  Social History   Socioeconomic History  . Marital status: Single    Spouse name: Not on file  . Number of children: 0  . Years of education: Not on file  . Highest education level: Not on file  Occupational History  . Not on file  Social Needs  . Financial resource strain: Not on file  . Food insecurity    Worry: Not on file    Inability: Not on file  . Transportation needs    Medical: Not on file    Non-medical: Not on file  Tobacco Use  . Smoking status: Never Smoker  . Smokeless tobacco: Never Used  Substance and Sexual Activity  . Alcohol use: No    Alcohol/week: 0.0 standard drinks  . Drug use: No  . Sexual activity: Never  Comment: given condoms  Lifestyle  . Physical activity    Days per week: Not on file    Minutes per session: Not on file  . Stress: Not on file  Relationships  . Social Herbalist on phone: Not on file    Gets together: Not on file    Attends religious service: Not on file    Active member of club or organization: Not on file    Attends meetings of clubs or organizations: Not on file    Relationship status: Not on file  . Intimate partner violence    Fear of current or ex partner: Not on file    Emotionally abused: Not on file    Physically abused: Not on file    Forced sexual activity: Not on file  Other Topics Concern  . 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 Headache Mother   . Hypertension Mother   . Heart attack Father 23  . Sudden Cardiac Death Neg Hx   . Colon cancer Neg Hx   . Liver disease Neg Hx   . Esophageal cancer Neg Hx   . Rectal cancer Neg Hx   . Stomach cancer Neg Hx     BP 125/85   Pulse 82   Ht 5' 8"  (1.727 m)   Wt 230 lb (104.3 kg)   BMI 34.97 kg/m   Review of Systems: See HPI above.     Objective:  Physical Exam:  Gen: NAD, comfortable in exam room  Left foot/ankle: Pes planus.  No other gross deformity, swelling, ecchymoses FROM with 5/5 strength.  Pain dorsiflexion. TTP over extensor digitorum, tib anterior. Negative ant drawer and talar tilt. Negative syndesmotic compression. Thompsons test negative. NV intact distally.  Right foot/ankle: Pes planus.  No other gross deformity, swelling, ecchymoses FROM with 5/5 strength. TTP mildly over extensor digitorum. Negative ant drawer and talar tilt. Negative syndesmotic compression. Thompsons test negative. NV intact distally.   Assessment & Plan:  1. Bilateral ankle pain - 2/2 extensor tendinopathy.  Icing, naproxen.  Shown home exercises to do daily.  Consider physical therapy, cam walker.  F/u in 6 weeks.

## 2018-08-22 ENCOUNTER — Telehealth: Payer: Self-pay | Admitting: Internal Medicine

## 2018-08-22 NOTE — Telephone Encounter (Signed)

## 2018-08-22 NOTE — Telephone Encounter (Signed)
Patient answered "NO" to all covid-19 questions

## 2018-08-23 ENCOUNTER — Encounter: Payer: Self-pay | Admitting: Internal Medicine

## 2018-08-23 ENCOUNTER — Other Ambulatory Visit: Payer: Medicare Other

## 2018-08-23 ENCOUNTER — Other Ambulatory Visit: Payer: Self-pay | Admitting: Internal Medicine

## 2018-08-23 ENCOUNTER — Other Ambulatory Visit: Payer: Self-pay

## 2018-08-23 ENCOUNTER — Ambulatory Visit (AMBULATORY_SURGERY_CENTER): Payer: Medicare Other | Admitting: Internal Medicine

## 2018-08-23 VITALS — BP 133/91 | HR 72 | Temp 98.3°F | Resp 21 | Ht 68.0 in | Wt 230.0 lb

## 2018-08-23 DIAGNOSIS — K6289 Other specified diseases of anus and rectum: Secondary | ICD-10-CM

## 2018-08-23 DIAGNOSIS — K50919 Crohn's disease, unspecified, with unspecified complications: Secondary | ICD-10-CM | POA: Diagnosis not present

## 2018-08-23 DIAGNOSIS — K529 Noninfective gastroenteritis and colitis, unspecified: Secondary | ICD-10-CM | POA: Diagnosis not present

## 2018-08-23 DIAGNOSIS — K625 Hemorrhage of anus and rectum: Secondary | ICD-10-CM | POA: Diagnosis not present

## 2018-08-23 MED ORDER — SODIUM CHLORIDE 0.9 % IV SOLN: 500.0000 mL | Freq: Once | INTRAVENOUS | Status: DC

## 2018-08-23 NOTE — Progress Notes (Signed)
To PACU, VSS. Report to [email or web address] 

## 2018-08-23 NOTE — Patient Instructions (Addendum)
The rectum looks better - but still have some inflammation.  I took biopsies and samples to see if infection still present.  I will let you know results and plans soon.  Cleanout was fine.   COME BACK TO MY OFFICE FOR APPOINTMENT 09/03/2018 AT 1130 AM   I appreciate the opportunity to care for you. Gatha Mayer, MD, [facility]  Resume previous diet.  YOU HAD AN ENDOSCOPIC PROCEDURE TODAY AT [facility]:   Refer to the procedure report that was given to you for any specific questions about what was found during the examination.  If the procedure report does not answer your questions, please call your gastroenterologist to clarify.  If you requested that your care partner not be given the details of your procedure findings, then the procedure report has been included in a sealed envelope for you to review at your convenience later.  YOU SHOULD EXPECT: Some feelings of bloating in the abdomen. Passage of more gas than usual.  Walking can help get rid of the air that was put into your GI tract during the procedure and reduce the bloating. If you had a lower endoscopy (such as a colonoscopy or flexible sigmoidoscopy) you may notice spotting of blood in your stool or on the toilet paper. If you underwent a bowel prep for your procedure, you may not have a normal bowel movement for a few days.  Please Note:  You might notice some irritation and congestion in your nose or some drainage.  This is from the oxygen used during your procedure.  There is no need for concern and it should clear up in a day or so.  SYMPTOMS TO REPORT IMMEDIATELY:   Following lower endoscopy (colonoscopy or flexible sigmoidoscopy):  Excessive amounts of blood in the stool  Significant tenderness or worsening of abdominal pains  Swelling of the abdomen that is new, acute  Fever of 100F or higher For urgent or emergent issues, a gastroenterologist can be reached at any hour by calling [phone]  [phone].   DIET:  We do recommend a small meal at first, but then you may proceed to your regular diet.  Drink plenty of fluids but you should avoid alcoholic beverages for 24 hours.  ACTIVITY:  You should plan to take it easy for the rest of today and you should NOT DRIVE or use heavy machinery until tomorrow (because of the sedation medicines used during the test).    FOLLOW UP: Our staff will call the number listed on your records 48-72 hours following your procedure to check on you and address any questions or concerns that you may have regarding the information given to you following your procedure. If we do not reach you, we will leave a message.  We will attempt to reach you two times.  During this call, we will ask if you have developed any symptoms of COVID 19. If you develop any symptoms (ie: fever, flu-like symptoms, shortness of breath, cough etc.) before then, please call [phone].  If you test positive for Covid 19 in the 2 weeks post procedure, please call and report this information to [place].    If any biopsies were taken you will be contacted by phone or by letter within the next 1-3 weeks.  Please call us at [phone] if you have not heard about the biopsies in 3 weeks.    SIGNATURES/CONFIDENTIALITY: You and/or your care partner have signed paperwork which will be entered into your electronic medical  record.  These signatures attest to the fact that that the information above on your After Visit Summary has been reviewed and is understood.  Full responsibility of the confidentiality of this discharge information lies with you and/or your care-partner.

## 2018-08-23 NOTE — Op Note (Signed)
[facility] Patient Name: Claus Silvestro Procedure Date: 08/23/2018 3:03 PM MRN: [id number] Endoscopist: Gatha Mayer , MD Age: 38 Referring MD:  Date of Birth: [birth year] Gender: Male Account #: [id number] Procedure:                Flexible Sigmoidoscopy Indications:              Proctitis Medicines:                Propofol per Anesthesia, Monitored Anesthesia Care Procedure:                Pre-Anesthesia Assessment:                           - Prior to the procedure, a History and Physical                            was performed, and patient medications and                            allergies were reviewed. The patient's tolerance of                            previous anesthesia was also reviewed. The risks                            and benefits of the procedure and the sedation                            options and risks were discussed with the patient.                            All questions were answered, and informed consent                            was obtained. Prior Anticoagulants: The patient has                            taken no previous anticoagulant or antiplatelet                            agents. ASA Grade Assessment: III - A patient with                            severe systemic disease. After reviewing the risks                            and benefits, the patient was deemed in                            satisfactory condition to undergo the procedure.                           After obtaining informed consent, the scope was  passed under direct vision. The Colonoscope was                            introduced through the anus and advanced to the the                            descending colon. The flexible sigmoidoscopy was                            accomplished without difficulty. The patient                            tolerated the procedure well. The quality of the                            bowel preparation was  good. Scope In: Scope Out: Findings:                 The perianal exam findings include a perianal rash.                           The digital rectal exam was normal.                           Ulcerated mucosa with no stigmata of recent                            bleeding were present in the rectum. Biopsies were                            taken with a cold forceps for histology.                            Verification of patient identification for the                            specimen was done. Estimated blood loss was minimal.                           The exam was otherwise without abnormality.                           Biopsies for histology were taken with a cold                            forceps from the descending colon and sigmoid colon                            for evaluation of microscopic colitis.                           The exam was otherwise without abnormality. Complications:            No immediate complications. Estimated Blood Loss:     Estimated blood loss was minimal. Impression:               -  Perianal rash found on perianal exam.                           - Mucosal ulceration. Biopsied. Small superficial                            rectal ulcer. Improved vs prior exam.                           - The examination was otherwise normal.                           - The examination was otherwise normal.                           - Biopsies were taken with a cold forceps from the                            descending colon and sigmoid colon for evaluation                            of microscopic colitis.                           Swab for GC Chlamydia taken also Recommendation:           - Patient has a contact number available for                            emergencies. The signs and symptoms of potential                            delayed complications were discussed with the                            patient. Return to normal activities tomorrow.                             Written discharge instructions were provided to the                            patient.                           - Resume previous diet.                           - Await pathology results. Gatha Mayer, MD 08/23/2018 3:30:26 PM This report has been signed electronically.

## 2018-08-23 NOTE — Progress Notes (Signed)
Pt's states no medical or surgical changes since previsit or office visit. 

## 2018-08-23 NOTE — Progress Notes (Signed)
Called to room to assist during endoscopic procedure.  Patient ID and intended procedure confirmed with present staff. Received instructions for my participation in the procedure from the performing physician.  

## 2018-08-27 ENCOUNTER — Telehealth: Payer: Self-pay | Admitting: *Deleted

## 2018-08-27 ENCOUNTER — Other Ambulatory Visit: Payer: Self-pay

## 2018-08-27 ENCOUNTER — Other Ambulatory Visit: Payer: Medicare Other

## 2018-08-27 DIAGNOSIS — A539 Syphilis, unspecified: Secondary | ICD-10-CM

## 2018-08-27 DIAGNOSIS — B20 Human immunodeficiency virus [HIV] disease: Secondary | ICD-10-CM | POA: Diagnosis not present

## 2018-08-27 LAB — SPECIMEN STATUS REPORT

## 2018-08-27 NOTE — Telephone Encounter (Signed)
I left a message with the sign language interpreter.  I asked her to explain that we do not have the August schedule out at this time and tom please call back in 1-2 weeks to make an appt.

## 2018-08-27 NOTE — Telephone Encounter (Signed)
Sheri or PJ,  This pt needs to cancel his OV on 09-03-18.  He needs to be rescheduled and notified- could you please do that?  I see Dr. Carlean Purl is either 100 percent or above full so I couldn't do it myself.  Thanks, [employer]

## 2018-08-27 NOTE — Telephone Encounter (Signed)
  Follow up Call-  Call back number 08/23/2018 01/28/2018  Post procedure Call Back phone  # [phone]  call through interpreter [phone]  Permission to leave phone message No Yes  Some recent data might be hidden     Patient questions:  Do you have a fever, pain , or abdominal swelling? No. Pain Score  0 *  Have you tolerated food without any problems? Yes.    Have you been able to return to your normal activities? Yes.    Do you have any questions about your discharge instructions: Diet   No. Medications  No. Follow up visit  No.  Do you have questions or concerns about your Care? No.  Actions: * If pain score is 4 or above: No action needed, pain <4.  1. Have you developed a fever since your procedure? no  2.   Have you had an respiratory symptoms (SOB or cough) since your procedure? no  3.   Have you tested positive for COVID 19 since your procedure no  4.   Have you had any family members/close contacts diagnosed with the COVID 19 since your procedure?  no   If yes to any of these questions please route to Joylene [name], RN and Alphonsa Gin, Therapist, sports.

## 2018-08-28 ENCOUNTER — Encounter: Payer: Self-pay | Admitting: Internal Medicine

## 2018-08-28 ENCOUNTER — Other Ambulatory Visit: Payer: Self-pay | Admitting: Internal Medicine

## 2018-08-28 LAB — GONOCOCCUS CULTURE

## 2018-08-28 LAB — GC/CHLAMYDIA PROBE AMP

## 2018-08-28 LAB — T-HELPER CELL (CD4) - (RCID CLINIC ONLY)
CD4 % Helper T Cell: 29 % — ABNORMAL LOW (ref 33–65)
CD4 T Cell Abs: 1103 /uL (ref 400–1790)

## 2018-08-28 MED ORDER — BALSALAZIDE DISODIUM 750 MG PO CAPS: 2250.0000 mg | ORAL_CAPSULE | Freq: Three times a day (TID) | ORAL | 3 refills | Status: DC

## 2018-08-28 NOTE — Progress Notes (Signed)
Biopsies indicate inflammatory bowel disease. Will prescribe Lialda 2.4 g daily.  Will send a letter to the patient explaining this.  My Chart

## 2018-08-28 NOTE — Progress Notes (Signed)
Correction, balsalazide is prescribed as Lialda not on formulary.

## 2018-09-03 ENCOUNTER — Telehealth: Payer: Self-pay | Admitting: *Deleted

## 2018-09-03 ENCOUNTER — Ambulatory Visit: Payer: Medicare Other | Admitting: Internal Medicine

## 2018-09-03 NOTE — Telephone Encounter (Signed)
A message was left, re: follow up visit. 

## 2018-09-05 LAB — CBC WITH DIFFERENTIAL/PLATELET
Absolute Monocytes: 669 cells/uL (ref 200–950)
Basophils Absolute: 69 cells/uL (ref 0–200)
Basophils Relative: 1 %
Eosinophils Absolute: 41 cells/uL (ref 15–500)
Eosinophils Relative: 0.6 %
HCT: 42.6 % (ref 38.5–50.0)
Hemoglobin: 14.6 g/dL (ref 13.2–17.1)
Lymphs Abs: 4085 cells/uL — ABNORMAL HIGH (ref 850–3900)
MCH: 30.5 pg (ref 27.0–33.0)
MCHC: 34.3 g/dL (ref 32.0–36.0)
MCV: 88.9 fL (ref 80.0–100.0)
MPV: 10.4 fL (ref 7.5–12.5)
Monocytes Relative: 9.7 %
Neutro Abs: 2036 cells/uL (ref 1500–7800)
Neutrophils Relative %: 29.5 %
Platelets: 257 10*3/uL (ref 140–400)
RBC: 4.79 10*6/uL (ref 4.20–5.80)
RDW: 12.5 % (ref 11.0–15.0)
Total Lymphocyte: 59.2 %
WBC: 6.9 10*3/uL (ref 3.8–10.8)

## 2018-09-05 LAB — COMPLETE METABOLIC PANEL WITH GFR
AG Ratio: 1.2 (calc) (ref 1.0–2.5)
ALT: 14 U/L (ref 9–46)
AST: 16 U/L (ref 10–40)
Albumin: 4.1 g/dL (ref 3.6–5.1)
Alkaline phosphatase (APISO): 87 U/L (ref 36–130)
BUN: 11 mg/dL (ref 7–25)
CO2: 29 mmol/L (ref 20–32)
Calcium: 9.2 mg/dL (ref 8.6–10.3)
Chloride: 103 mmol/L (ref 98–110)
Creat: 1.04 mg/dL (ref 0.60–1.35)
GFR, Est African American: 105 mL/min/{1.73_m2} (ref >=60)
GFR, Est Non African American: 91 mL/min/{1.73_m2} (ref >=60)
Globulin: 3.3 g/dL (calc) (ref 1.9–3.7)
Glucose, Bld: 112 mg/dL — ABNORMAL HIGH (ref 65–99)
Potassium: 4.1 mmol/L (ref 3.5–5.3)
Sodium: 139 mmol/L (ref 135–146)
Total Bilirubin: 0.6 mg/dL (ref 0.2–1.2)
Total Protein: 7.4 g/dL (ref 6.1–8.1)

## 2018-09-05 LAB — RPR TITER: RPR Titer: 1:16 {titer} — ABNORMAL HIGH

## 2018-09-05 LAB — RPR: RPR Ser Ql: REACTIVE — AB

## 2018-09-05 LAB — HIV-1 RNA QUANT-NO REFLEX-BLD
HIV 1 RNA Quant: <20 copies/mL
HIV-1 RNA Quant, Log: <1.3 Log copies/mL

## 2018-09-05 LAB — FLUORESCENT TREPONEMAL AB(FTA)-IGG-BLD: Fluorescent Treponemal ABS: REACTIVE — AB

## 2018-09-12 ENCOUNTER — Ambulatory Visit (INDEPENDENT_AMBULATORY_CARE_PROVIDER_SITE_OTHER): Payer: Medicare Other | Admitting: Internal Medicine

## 2018-09-12 ENCOUNTER — Other Ambulatory Visit: Payer: Self-pay

## 2018-09-12 ENCOUNTER — Encounter: Payer: Self-pay | Admitting: Internal Medicine

## 2018-09-12 ENCOUNTER — Ambulatory Visit: Payer: Medicare Other

## 2018-09-12 DIAGNOSIS — B20 Human immunodeficiency virus [HIV] disease: Secondary | ICD-10-CM | POA: Diagnosis not present

## 2018-09-12 DIAGNOSIS — A539 Syphilis, unspecified: Secondary | ICD-10-CM

## 2018-09-12 NOTE — Assessment & Plan Note (Signed)
His RPR is declining appropriately after treatment for syphilis.

## 2018-09-12 NOTE — Assessment & Plan Note (Signed)
His infection remains under excellent, long-term control.  He will continue Biktarvy and follow-up after lab work in 6 months.

## 2018-09-12 NOTE — Progress Notes (Signed)
Patient Active Problem List   Diagnosis Date Noted  . 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    Priority: High  . Chlamydia infection 03/29/2018    Priority: High  . Human immunodeficiency virus (HIV) disease ([place]) 03/02/2006    Priority: High  . 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 Dyspnea on exertion 10/19/2016  . Poor dentition 09/21/2016  . Pulmonary nodule 08/17/2016  . Obesity (BMI 30.0-34.9) 04/20/2016  . Dyslipidemia 07/02/2012  . GERD (gastroesophageal reflux disease) 06/28/2010  . 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 ANXIETY 03/02/2006  . Depression 03/02/2006  . ALLERGIC RHINITIS 03/02/2006    Patient's Medications  New Prescriptions   No medications on file  Previous Medications   BALSALAZIDE (COLAZAL) 750 MG CAPSULE    Take 3 capsules (2,250 mg total) by mouth 3 (three) times daily.   BIKTARVY 50-200-25 MG TABS TABLET    TAKE 1 TABLET BY MOUTH EVERY DAY   NAPROXEN (NAPROXEN DR) 500 MG EC TABLET    Take 1 tablet (500 mg total) by mouth 2 (two) times daily with a meal.  Modified Medications   No medications on file  Discontinued Medications   No medications on file    Subjective: Matthew Fitzpatrick is in for his routine follow-up visit.  He denies any problems obtaining, taking or tolerating his Biktarvy.  He denies missing any doses.  He says that he is not in a relationship and has not been sexually active since his last visit.    Review of Systems: Review of Systems  Constitutional: Negative for chills, diaphoresis and fever.  Gastrointestinal: Negative for abdominal pain, diarrhea, nausea and vomiting.    Past Medical History:  Diagnosis Date  . Acute conjunctivitis 02/12/2014  . Acute gastroenteritis 08/12/2015  . ANXIETY 03/02/2006   Qualifier: Diagnosis of  By: [name] MD, Nena Hampe    . Bronchitis 01/26/2011  . CAP (community acquired pneumonia)   . CHICKENPOX, HX OF 06/20/2006   Annotation:  age 67 Qualifier: Diagnosis of  By: [name] MD, Jenny Reichmann    . Chlamydia infection 03/29/2018  . COUGH, CHRONIC 12/23/2008   Qualifier: Diagnosis of  By: [name] MD, Candiss Galeana    . Deaf   . 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 DEPRESSION 03/02/2006   Qualifier: Diagnosis of  By: [name] MD, Kensli Bowley    . Dyslipidemia 07/02/2012  . 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 FACIAL RASH 11/17/2008   Qualifier: Diagnosis of  By: [name] MD, Marnie Fazzino    . GERD 07/20/2009   Qualifier: Diagnosis of  By: [name] MD, Commodore Bellew    . GERD (gastroesophageal reflux disease)   . HIV disease ([place])   . Obesity (BMI 30.0-34.9) 04/20/2016  . PEDICULOSIS PUBIS (PUBIC LOUSE) 06/20/2006   Annotation: 4/06 Qualifier: Diagnosis of  By: [name] MD, Delonna Ney    . Poor dentition 09/21/2016  . 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 Pulmonary nodule   . SINUSITIS, CHRONIC 02/18/2009   Qualifier: Diagnosis of  By: Tommy Medal MD, Roderic Scarce    . 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  . 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 Urinary frequency 04/06/2009   Qualifier: Diagnosis of  By: [name] MD, Jenny Reichmann      Social History  Tobacco Use  . Smoking status: Never Smoker  . Smokeless tobacco: Never Used  Substance Use Topics  . Alcohol use: No    Alcohol/week: 0.0 standard drinks  . Drug use: No    Family History  Problem Relation Age of Onset  . Headache Mother   . Hypertension Mother   . Heart attack Father 43  . Sudden Cardiac Death Neg Hx   . Colon cancer Neg Hx   . Liver disease Neg Hx   . Esophageal cancer Neg Hx   . Rectal cancer Neg Hx   . Stomach cancer Neg Hx     No Known Allergies  Health Maintenance  Topic Date Due  . TETANUS/TDAP  10/26/1998  . INFLUENZA VACCINE  09/28/2018  . HIV Screening  Completed    Objective:  Vitals:    09/12/18 0857  BP: 102/72  Pulse: 70  Temp: 98 F (36.7 C)   There is no height or weight on file to calculate BMI.  Physical Exam Constitutional:      General: He is not in acute distress. Cardiovascular:     Rate and Rhythm: Normal rate and regular rhythm.     Heart sounds: No murmur.  Pulmonary:     Effort: Pulmonary effort is normal.     Breath sounds: Normal breath sounds.  Abdominal:     Palpations: Abdomen is soft.     Tenderness: There is no abdominal tenderness.  Psychiatric:        Mood and Affect: Mood normal.     Lab Results Lab Results  Component Value Date   WBC 6.9 08/27/2018   HGB 14.6 08/27/2018   HCT 42.6 08/27/2018   MCV 88.9 08/27/2018   PLT 257 08/27/2018    Lab Results  Component Value Date   CREATININE 1.04 08/27/2018   BUN 11 08/27/2018   NA 139 08/27/2018   K 4.1 08/27/2018   CL 103 08/27/2018   CO2 29 08/27/2018    Lab Results  Component Value Date   ALT 14 08/27/2018   AST 16 08/27/2018   ALKPHOS 134 (H) 08/13/2017   BILITOT 0.6 08/27/2018    Lab Results  Component Value Date   CHOL 218 (H) 03/02/2016   HDL 45 03/02/2016   LDLCALC 145 (H) 03/02/2016   TRIG 141 03/02/2016   CHOLHDL 4.8 03/02/2016   Lab Results  Component Value Date   LABRPR REACTIVE (A) 08/27/2018   RPRTITER 1:16 (H) 08/27/2018   HIV 1 RNA Quant (copies/mL)  Date Value  08/27/2018 <20 NOT DETECTED  03/28/2018 <20 DETECTED (A)  09/25/2017 <20 NOT DETECTED   CD4 T Cell Abs (/uL)  Date Value  08/27/2018 1,103  03/28/2018 560  09/25/2017 950     Problem List Items Addressed This Visit      High   Syphilis    His RPR is declining appropriately after treatment for syphilis.      Human immunodeficiency virus (HIV) disease ([place])    His infection remains under excellent, long-term control.  He will continue Biktarvy and follow-up after lab work in 6 months.      Relevant Orders   T-helper cell (CD4)- ([facility] clinic only)   HIV-1 RNA quant-no  reflex-bld   CBC   Comprehensive metabolic panel   RPR        Michel Bickers, MD [facility] for [place] [phone] pager   [phone] cell 09/12/2018, 10:04 AM

## 2018-09-18 ENCOUNTER — Encounter: Payer: Self-pay | Admitting: Internal Medicine

## 2018-09-30 ENCOUNTER — Ambulatory Visit: Payer: Medicare Other | Admitting: Family Medicine

## 2018-10-02 ENCOUNTER — Encounter: Payer: Self-pay | Admitting: Family Medicine

## 2018-10-02 ENCOUNTER — Ambulatory Visit (INDEPENDENT_AMBULATORY_CARE_PROVIDER_SITE_OTHER): Payer: Medicare Other | Admitting: Family Medicine

## 2018-10-02 ENCOUNTER — Other Ambulatory Visit: Payer: Self-pay

## 2018-10-02 DIAGNOSIS — M2142 Flat foot [pes planus] (acquired), left foot: Secondary | ICD-10-CM | POA: Diagnosis not present

## 2018-10-02 DIAGNOSIS — M2140 Flat foot [pes planus] (acquired), unspecified foot: Secondary | ICD-10-CM | POA: Insufficient documentation

## 2018-10-02 DIAGNOSIS — M2141 Flat foot [pes planus] (acquired), right foot: Secondary | ICD-10-CM

## 2018-10-02 DIAGNOSIS — M25572 Pain in left ankle and joints of left foot: Secondary | ICD-10-CM | POA: Diagnosis not present

## 2018-10-02 NOTE — Assessment & Plan Note (Signed)
Likely having an impingement due to the pes planus that is worse on the left than the right.  May have a component of posterior tibialis tendinitis or tendinopathy based on the loss of the longitudinal arch. -Counseled on home exercise therapy and supportive care. -Would consider custom orthotics to help with alignment. -Could consider nitro at the insertion of the posterior tib. -Could consider midfoot arch strap.

## 2018-10-02 NOTE — Patient Instructions (Signed)
Nice to meet you  Please try compression  Please try taking a few breaks during your 12 hour shift.  You may want to try some ice on the left ankle Please send me a message in MyChart with any questions or updates.  Please see me back to make custom orthotics. Please bring the shoes you use for work or that you wear the most.   --Dr. Raeford Razor

## 2018-10-02 NOTE — Assessment & Plan Note (Signed)
Likely contributing to his ankle pain. -Could consider custom orthotics.

## 2018-10-02 NOTE — Progress Notes (Signed)
Matthew Fitzpatrick - 38 y.o. male MRN [id number]  Date of birth: Aug 19, 1980  SUBJECTIVE:  Including CC & ROS.  Chief Complaint  Patient presents with  . Follow-up    follow up for left foot/ankle    Matthew Fitzpatrick is a 38 y.o. male that is following up for his left ankle pain.  He reports improvement of his symptoms at this time.  He feels the pain over the medial aspect of the midfoot as well as anterior ankle joint.  This is worse at the end of the day after working a 12-hour shift.  He denies any significant injury or inciting event.  He does have swelling intermittently.  Does get improvement with the exercises..  Independent review of the left ankle x-ray from 2019 shows no acute abnormality.  Independent review of the right ankle x-ray from 2019 shows suspected soft tissue swelling.   Review of Systems  Constitutional: Negative for fever.  HENT: Negative for congestion.   Respiratory: Negative for cough.   Cardiovascular: Negative for chest pain.  Gastrointestinal: Negative for abdominal pain.  Musculoskeletal: Negative for back pain.  Skin: Negative for color change.  Neurological: Negative for weakness.  Psychiatric/Behavioral: Negative for agitation.    HISTORY: Past Medical, Surgical, Social, and Family History Reviewed & Updated per EMR.   Pertinent Historical Findings include:  Past Medical History:  Diagnosis Date  . Acute conjunctivitis 02/12/2014  . Acute gastroenteritis 08/12/2015  . ANXIETY 03/02/2006   Qualifier: Diagnosis of  By: [name] MD, [name]    . Bronchitis 01/26/2011  . CAP (community acquired pneumonia)   . CHICKENPOX, HX OF 06/20/2006   Annotation: age 64 Qualifier: Diagnosis of  By: [name] MD, Jenny Reichmann    . Chlamydia infection 03/29/2018  . COUGH, CHRONIC 12/23/2008   Qualifier: Diagnosis of  By: [name] MD, [name]    . Deaf   . 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 DEPRESSION 03/02/2006   Qualifier: Diagnosis of  By: [name] MD, [name]    . Dyslipidemia 07/02/2012  . 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 FACIAL RASH 11/17/2008   Qualifier: Diagnosis of  By: [name] MD, [name]    . GERD 07/20/2009   Qualifier: Diagnosis of  By: [name] MD, [name]    . GERD (gastroesophageal reflux disease)   . HIV disease ([place])   . Obesity (BMI 30.0-34.9) 04/20/2016  . PEDICULOSIS PUBIS (PUBIC LOUSE) 06/20/2006   Annotation: 4/06 Qualifier: Diagnosis of  By: [name] MD, [name]    . Poor dentition 09/21/2016  . 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 Pulmonary nodule   . SINUSITIS, CHRONIC 02/18/2009   Qualifier: Diagnosis of  By: Tommy Medal MD, Roderic Scarce    . 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  . 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 Urinary frequency 04/06/2009   Qualifier: Diagnosis of  By: [name] MD, [name]      Past Surgical History:  Procedure Laterality Date  . colonoscopy    . THROAT SURGERY    . UMBILICAL HERNIA REPAIR      No Known Allergies  Family History  Problem Relation Age of Onset  . Headache Mother   . Hypertension Mother   . Heart attack Father 38  . Sudden Cardiac Death Neg Hx   . Colon  cancer Neg Hx   . Liver disease Neg Hx   . Esophageal cancer Neg Hx   . Rectal cancer Neg Hx   . Stomach cancer Neg Hx      Social History   Socioeconomic History  . Marital status: Single    Spouse name: Not on file  . Number of children: 0  . Years of education: Not on file  . Highest education level: Not on file  Occupational History  . Not on file  Social Needs  . Financial resource strain: Not on file  . Food insecurity    Worry: Not on file    Inability: Not on file  . Transportation needs    Medical: Not on file    Non-medical: Not on file  Tobacco Use  . Smoking status: Never Smoker  . Smokeless tobacco: Never Used   Substance and Sexual Activity  . Alcohol use: No    Alcohol/week: 0.0 standard drinks  . Drug use: No  . Sexual activity: Never    Comment: given condoms  Lifestyle  . Physical activity    Days per week: Not on file    Minutes per session: Not on file  . Stress: Not on file  Relationships  . Social Herbalist on phone: Not on file    Gets together: Not on file    Attends religious service: Not on file    Active member of club or organization: Not on file    Attends meetings of clubs or organizations: Not on file    Relationship status: Not on file  . Intimate partner violence    Fear of current or ex partner: Not on file    Emotionally abused: Not on file    Physically abused: Not on file    Forced sexual activity: Not on file  Other Topics Concern  . Not on file  Social History Narrative   Lives alone   Mom lives with his sister in [place]   No children   Works in Pensions consultant in [place]   Enjoys working on [employer]   No pets     PHYSICAL EXAM:  VS: BP 125/80   Pulse (!) 59   Ht 5' 8"  (1.727 m)   Wt 230 lb (104.3 kg)   BMI 34.97 kg/m  Physical Exam Gen: NAD, alert, cooperative with exam, well-appearing ENT: normal lips, normal nasal mucosa,  Eye: normal EOM, normal conjunctiva and lids CV:  no edema, +2 pedal pulses   Resp: no accessory muscle use, non-labored,  Skin: no rashes, no areas of induration  Neuro: normal tone, normal sensation to touch Psych:  normal insight, alert and oriented MSK:  Left ankle/foot: Obvious pes planus is worse on the left than the right. Limited dorsiflexion. Tenderness to palpation over the navicular. Some weakness with inversion. Neurovascular intact     ASSESSMENT & PLAN:   Pes planus Likely contributing to his ankle pain. -Could consider custom orthotics.  Left ankle pain Likely having an impingement due to the pes planus that is worse on the left than the right.   May have a component of posterior tibialis tendinitis or tendinopathy based on the loss of the longitudinal arch. -Counseled on home exercise therapy and supportive care. -Would consider custom orthotics to help with alignment. -Could consider nitro at the insertion of the posterior tib. -Could consider midfoot arch strap.

## 2018-10-14 ENCOUNTER — Telehealth: Payer: Self-pay | Admitting: *Deleted

## 2018-10-14 ENCOUNTER — Other Ambulatory Visit: Payer: Self-pay

## 2018-10-14 ENCOUNTER — Telehealth: Payer: Self-pay

## 2018-10-14 NOTE — Telephone Encounter (Signed)
Agree. Thanks

## 2018-10-14 NOTE — Telephone Encounter (Signed)
Patient called office and left a message that he has had a bad tooth ache and has been to the ED about it but they did nothing to help. He is I a lot of pain and needs something to help.   Caled patient to see how long it has bee hurting, what is is taking, and if he has a dentist or needs to see ours. He did not answer the line so had to leave a message for him to call the office when he is available.

## 2018-10-14 NOTE — Telephone Encounter (Signed)
Incoming call with sign language interpreter. Patient complaining of tooth pain ongoing for one week now and asking for pain medication. Patient advised to contact PCP for pain medication and made aware that we can get him on the list to be scheduled in with our dental clinic. Eugenia Mcalpine

## 2018-10-15 NOTE — Progress Notes (Signed)
[place] at [facility] [address] [place], [place] [address] [phone]  Date:  10/17/2018   Name:  Matthew Fitzpatrick   DOB:  Jul 08, 1980   MRN:  [id number]  PCP:  [name], NP    Chief Complaint: No chief complaint on file.   History of Present Illness:  Matthew Fitzpatrick is a 38 y.o. very pleasant male patient who presents with the following:  Patient who typically sees [name], here today with concern for tooth problem/pain I have not seen this patient in the past History of deafness, HIV, ulcerative colitis, syphilis status post treatment  He saw his infectious disease doctor, Dr. [name], last month His HIV is treated with [facility] and has been under good control His gastroenterologist is Dr. Carlean Purl  Seen today with [place] interpreter   He has noted tooth pain for about one week- I think it is number 22 This same tooth was a problem a year ago - he was not able to see a dentist due to finances however.  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 Pes planus 10/02/2018  . Left ankle pain 10/02/2018  . 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  . Chlamydia infection 03/29/2018  . 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 Dyspnea on exertion 10/19/2016  . Poor dentition 09/21/2016  . Pulmonary nodule 08/17/2016  . Obesity (BMI 30.0-34.9)  04/20/2016  . Dyslipidemia 07/02/2012  . GERD (gastroesophageal reflux disease) 06/28/2010  . 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 Human immunodeficiency virus (HIV) disease ([place]) 03/02/2006  . ANXIETY 03/02/2006  . Depression 03/02/2006  . ALLERGIC RHINITIS 03/02/2006    Past Medical History:  Diagnosis Date  . Acute conjunctivitis 02/12/2014  . Acute gastroenteritis 08/12/2015  . ANXIETY 03/02/2006   Qualifier: Diagnosis of  By: [name] MD, [name]    . Bronchitis 01/26/2011  . CAP (community acquired pneumonia)   . CHICKENPOX, HX OF 06/20/2006   Annotation: age 18 Qualifier: Diagnosis of  By: [name] MD, Jenny Reichmann    . Chlamydia infection 03/29/2018  . COUGH, CHRONIC 12/23/2008   Qualifier: Diagnosis of  By: [name] MD, [name]    . Deaf   . 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 DEPRESSION 03/02/2006   Qualifier: Diagnosis of  By: [name] MD, [name]    . Dyslipidemia 07/02/2012  . 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 FACIAL RASH 11/17/2008   Qualifier: Diagnosis of  By: [name] MD, [name]    . GERD 07/20/2009   Qualifier: Diagnosis of  By: [name] MD, [name]    . GERD (gastroesophageal reflux disease)   . HIV disease ([place])   .  Obesity (BMI 30.0-34.9) 04/20/2016  . PEDICULOSIS PUBIS (PUBIC LOUSE) 06/20/2006   Annotation: 4/06 Qualifier: Diagnosis of  By: [name] MD, [name]    . Poor dentition 09/21/2016  . 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 Pulmonary nodule   . SINUSITIS, CHRONIC 02/18/2009   Qualifier: Diagnosis of  By: Tommy Medal MD, Roderic Scarce    . 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  . 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 Urinary frequency 04/06/2009   Qualifier: Diagnosis of  By: [name] MD, [name]      Past Surgical History:  Procedure Laterality Date  .  colonoscopy    . THROAT SURGERY    . UMBILICAL HERNIA REPAIR      Social History   Tobacco Use  . Smoking status: Never Smoker  . Smokeless tobacco: Never Used  Substance Use Topics  . Alcohol use: No    Alcohol/week: 0.0 standard drinks  . Drug use: No    Family History  Problem Relation Age of Onset  . Headache Mother   . Hypertension Mother   . Heart attack Father 28  . Sudden Cardiac Death Neg Hx   . Colon cancer Neg Hx   . Liver disease Neg Hx   . Esophageal cancer Neg Hx   . Rectal cancer Neg Hx   . Stomach cancer Neg Hx     No Known Allergies  Medication list has been reviewed and updated.  Current Outpatient Medications on File Prior to Visit  Medication Sig Dispense Refill  . balsalazide (COLAZAL) 750 MG capsule Take 3 capsules (2,250 mg total) by mouth 3 (three) times daily. 270 capsule 3  . BIKTARVY 50-200-25 MG TABS tablet TAKE 1 TABLET BY MOUTH EVERY DAY 30 tablet 5  . naproxen (NAPROXEN DR) 500 MG EC tablet Take 1 tablet (500 mg total) by mouth 2 (two) times daily with a meal. 60 tablet 1   No current facility-administered medications on file prior to visit.     Review of Systems:  As per HPI- otherwise negative. No fever or chills No rash   Physical Examination: Vitals:   10/17/18 1303  BP: 125/80  Pulse: 71  Resp: 16  Temp: (!) 97.4 F (36.3 C)  SpO2: 97%   Vitals:   10/17/18 1303  Weight: 226 lb (102.5 kg)  Height: 5' 8"  (1.727 m)   Body mass index is 34.36 kg/m. Ideal Body Weight: Weight in (lb) to have BMI = 25: 164.1  GEN: WDWN, NAD, Non-toxic, A & O x 3, overweight, looks well  HEENT: Atraumatic, Normocephalic. Neck supple. No masses, No LAD.  TM wnl Very poor dentition with multiple broken or missing teeth The tooth in question has part of the enamel broken away at the gum line leaving the root exposed No pus or abscess but there is inflammation at the gum line  Ears and Nose: No external deformity. CV: RRR, No M/G/R. No  JVD. No thrill. No extra heart sounds. PULM: CTA B, no wheezes, crackles, rhonchi. No retractions. No resp. distress. No accessory muscle use. EXTR: No c/c/e NEURO Normal gait.  PSYCH: Normally interactive. Conversant. Not depressed or anxious appearing.  Calm demeanor.  His feet are flat bilaterally, well perfused with no acute findings     Assessment and Plan:   ICD-10-CM   1. Dental infection  K04.7 penicillin v potassium (VEETID) 500 MG tablet    HYDROcodone-acetaminophen (NORCO/VICODIN) 5-325  MG tablet  2. Closed fracture of tooth, initial encounter  S02.5XXA penicillin v potassium (VEETID) 500 MG tablet    HYDROcodone-acetaminophen (NORCO/VICODIN) 5-325 MG tablet  3. Bilateral foot pain  M79.671    M79.672    Here today with dental pain and infection  Treat with antibiotics and pain medication Message to ID LPN about dental clinic Also suggested that pt look into [facility] dental clinic  Gave a limited supply of hydrocodones as pt reports his pain is not controlled with OTC meds I do think custom insoles for his pes planus may be helpful- recommended follow-up with sports med  See patient instructions for more details.     Follow-up: No follow-ups on file.  Meds ordered this encounter  Medications  . penicillin v potassium (VEETID) 500 MG tablet    Sig: Take 1 tablet (500 mg total) by mouth 3 (three) times daily.    Dispense:  30 tablet    Refill:  0  . HYDROcodone-acetaminophen (NORCO/VICODIN) 5-325 MG tablet    Sig: Take 1 tablet by mouth every 8 (eight) hours as needed for up to 5 days.    Dispense:  10 tablet    Refill:  0   No orders of the defined types were placed in this encounter.   Signed Lamar Blinks, MD

## 2018-10-17 ENCOUNTER — Encounter: Payer: Self-pay | Admitting: Family Medicine

## 2018-10-17 ENCOUNTER — Telehealth: Payer: Self-pay

## 2018-10-17 ENCOUNTER — Ambulatory Visit (INDEPENDENT_AMBULATORY_CARE_PROVIDER_SITE_OTHER): Payer: Medicare Other | Admitting: Family Medicine

## 2018-10-17 ENCOUNTER — Other Ambulatory Visit: Payer: Self-pay

## 2018-10-17 VITALS — BP 125/80 | HR 71 | Temp 97.4°F | Resp 16 | Ht 68.0 in | Wt 226.0 lb

## 2018-10-17 DIAGNOSIS — K047 Periapical abscess without sinus: Secondary | ICD-10-CM

## 2018-10-17 DIAGNOSIS — M79671 Pain in right foot: Secondary | ICD-10-CM

## 2018-10-17 DIAGNOSIS — S025XXA Fracture of tooth (traumatic), initial encounter for closed fracture: Secondary | ICD-10-CM

## 2018-10-17 DIAGNOSIS — M79672 Pain in left foot: Secondary | ICD-10-CM

## 2018-10-17 MED ORDER — PENICILLIN V POTASSIUM 500 MG PO TABS: 500.0000 mg | ORAL_TABLET | Freq: Three times a day (TID) | ORAL | 0 refills | Status: DC

## 2018-10-17 MED ORDER — HYDROCODONE-ACETAMINOPHEN 5-325 MG PO TABS: 1.0000 | ORAL_TABLET | Freq: Three times a day (TID) | ORAL | 0 refills | Status: AC | PRN

## 2018-10-17 NOTE — Telephone Encounter (Signed)
Patient returned missed call from our office. Per last note LPN was trying to reach patient with information for Dental clinic. Was able to provide patient with Margaret's number at [facility] to schedule an appointment. Patient did not have any questions at this time. [place]

## 2018-10-17 NOTE — Telephone Encounter (Signed)
Received message from Dr. Lorelei Pont requesting [facility] assistance with getting patient scheduled with the Dental Clinic. Per Joycelyn Schmid with [facility] they have tried reaching out to patient x2 and left voicemail but have received any call backs to be scheduled.  LPN will contact patient via [place] with contact information for [facility] for Dental Clinic appointment.  Eugenia Mcalpine

## 2018-10-17 NOTE — Patient Instructions (Signed)
It was good to see you today but I am sorry about your tooth problem!  I sent in an rx for antibiotics and pain medication Per recent ID notes it does look like there is a dental clinic you can see- I will follow-up on this and ask them to contact you via mychart   Warm water swishes after each meal may help Let us know if getting worse or if any fever

## 2018-10-17 NOTE — Telephone Encounter (Signed)
-----   Message from Darreld Mclean, MD sent at 10/17/2018  1:28 PM EDT ----- Joanell Rising I saw [place] today for his tooth issue.  Unfortunately his teeth are in really bad shape - I saw that there might be a dental clinic he can go to?  Any help that you guys have for him would be much appreciated   Thank you! Kimberly

## 2018-10-21 ENCOUNTER — Other Ambulatory Visit: Payer: Self-pay | Admitting: Internal Medicine

## 2018-10-21 DIAGNOSIS — B20 Human immunodeficiency virus [HIV] disease: Secondary | ICD-10-CM

## 2018-11-19 IMAGING — DX DG CHEST 2V
2 series · 2 of 2 positions shown · non-contrast
Comparison: Chest x-ray of January 11, 2011

CLINICAL DATA: Five years of intermittent chest tightness. History
of asthma, HIV common nonsmoker.

EXAM:
CHEST  2 VIEW

[chest pa]
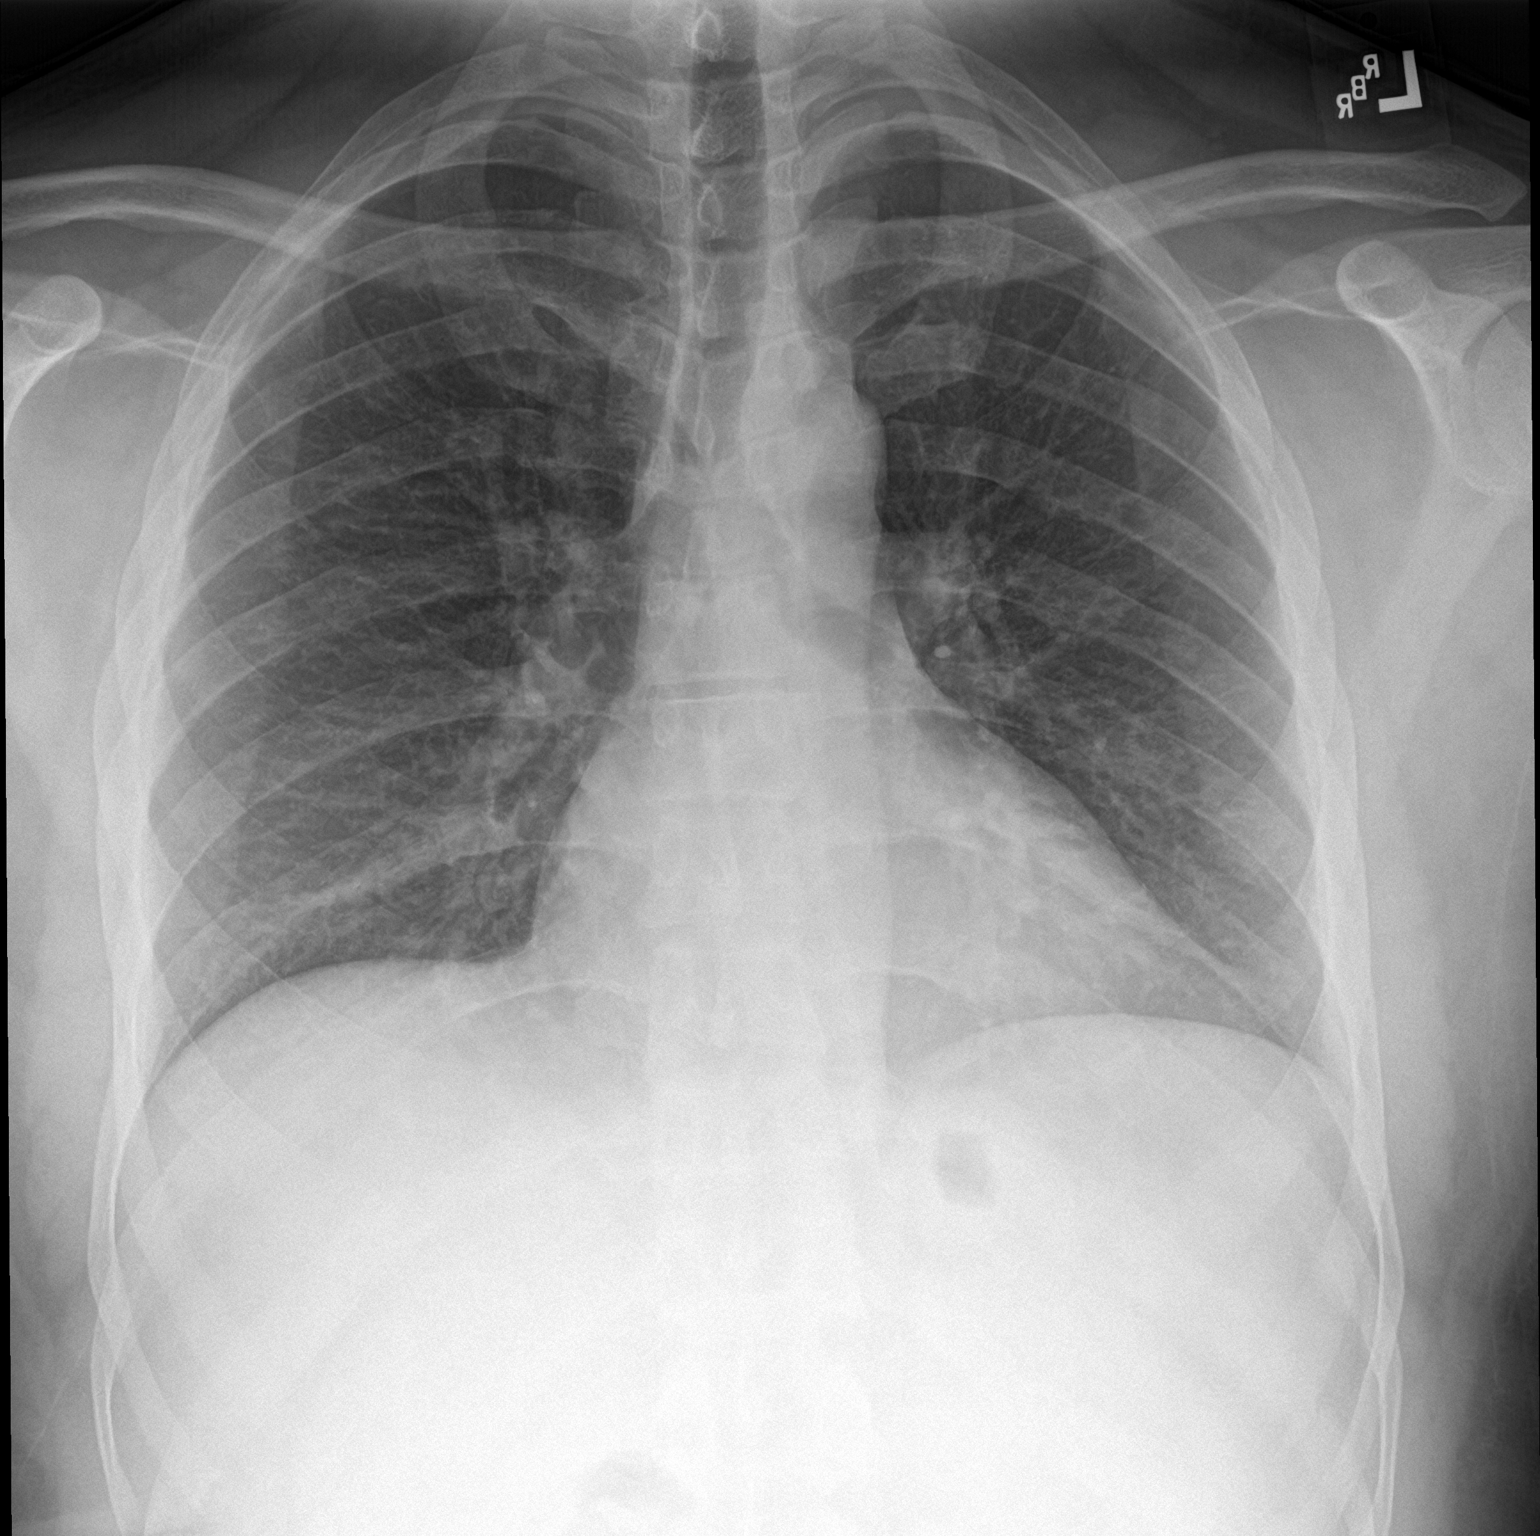

[chest lat]
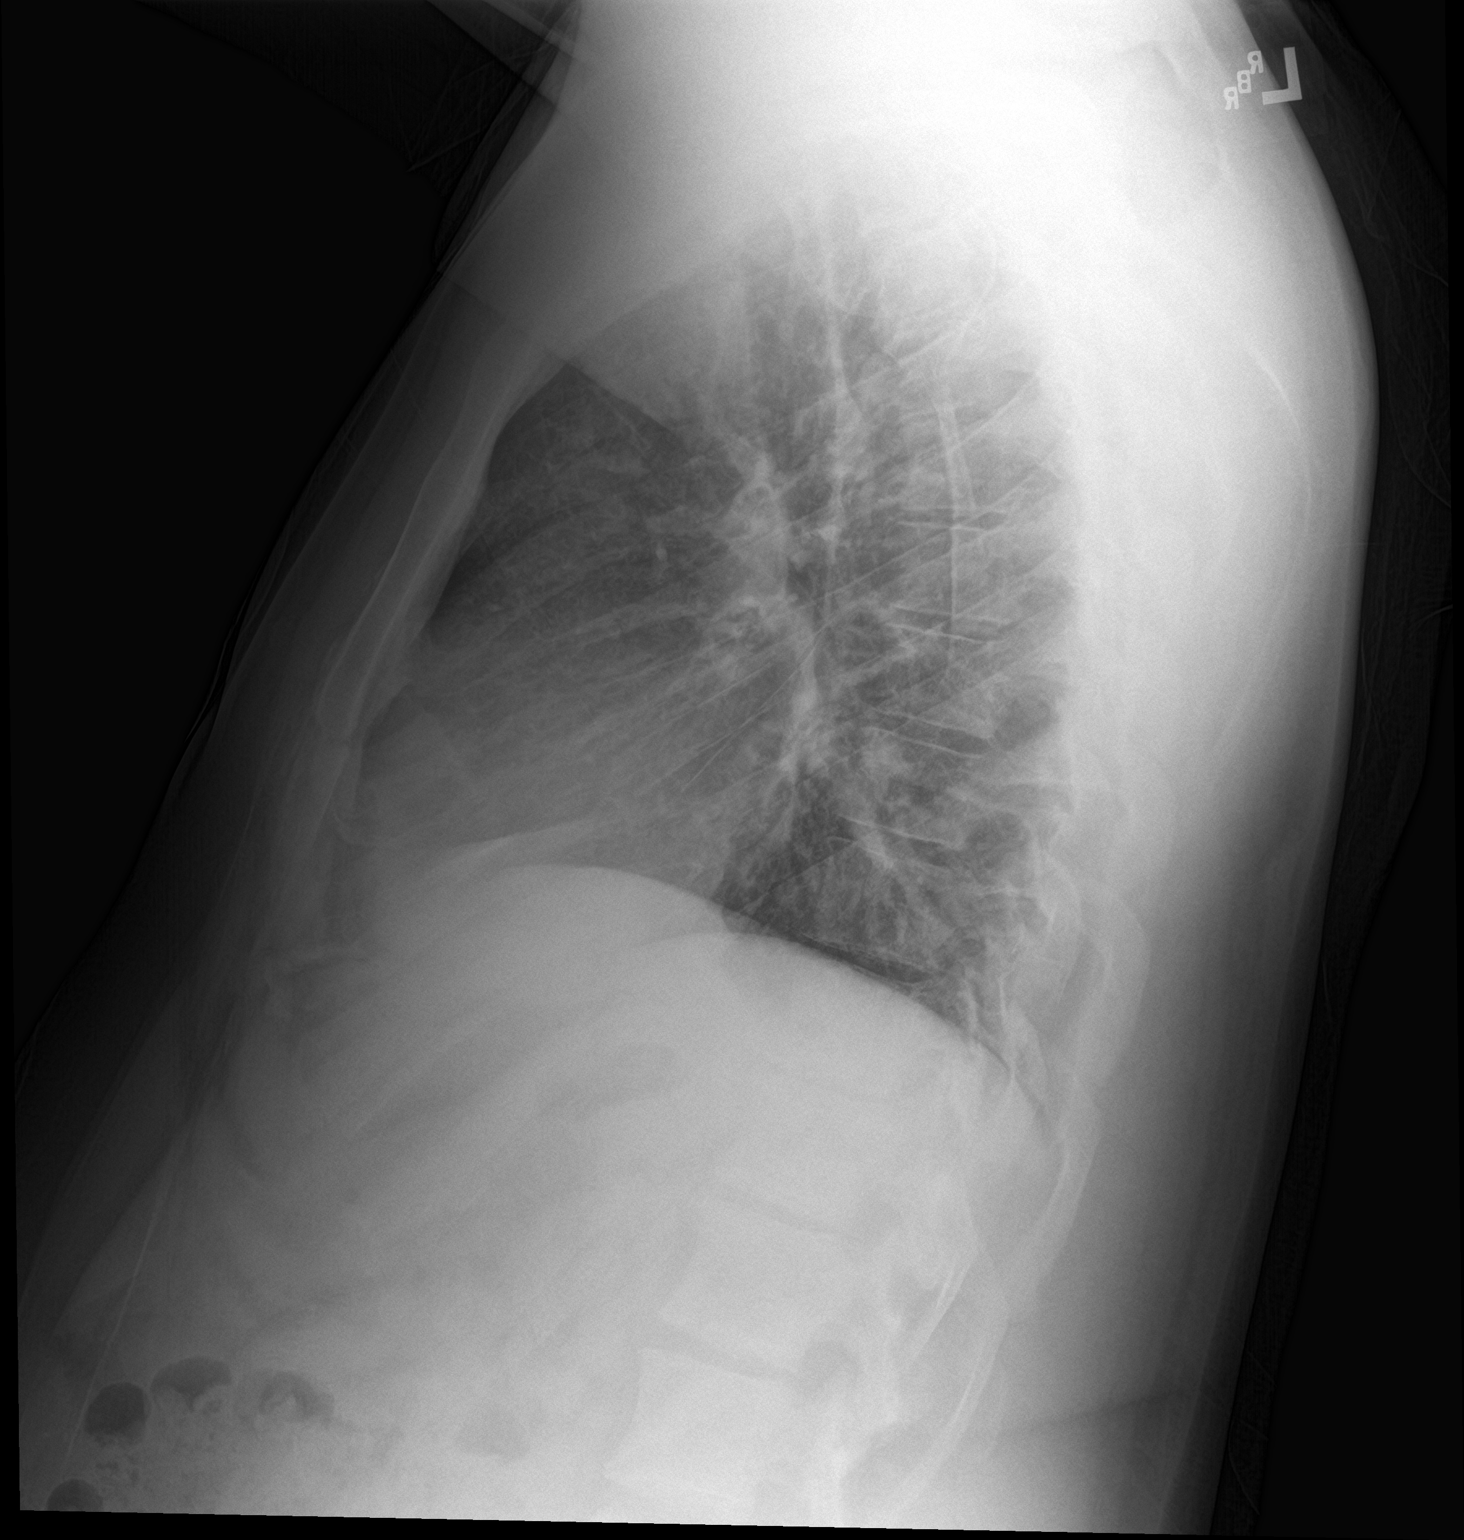

[2 of 2 positions shown; findings below may reference images not displayed]

FINDINGS: The lungs are well-expanded. There is no focal infiltrate. The
interstitial markings are coarse though stable. The heart and
pulmonary vascularity are normal. The mediastinum is normal in
width. The trachea is midline. The bony thorax exhibits no acute
abnormality.
IMPRESSION: Mild stable interstitial prominence which likely reflects known
reactive airway disease. There is no pneumonia, CHF, nor other acute
cardiopulmonary abnormality.

## 2019-02-06 ENCOUNTER — Telehealth: Payer: Self-pay | Admitting: *Deleted

## 2019-02-06 NOTE — Telephone Encounter (Signed)
Patient called 1) for phone number to schedule a dental appointment and 2) to find out where he can get a covid test. He states that he is having mild cold symptoms, but that all of his friends have been tested and he wants to, too. RN gave patient covid appointment information (Text "COVID" to [phone]) or advised him to go to an urgent care for screening/testing. Patient agreed.  RN reminded him that he needs to  Renew his SPAP in January.   Landis Gandy, RN

## 2019-03-17 ENCOUNTER — Ambulatory Visit: Payer: Medicare Other

## 2019-03-17 ENCOUNTER — Other Ambulatory Visit: Payer: Medicare Other

## 2019-03-17 ENCOUNTER — Other Ambulatory Visit: Payer: Self-pay

## 2019-03-17 DIAGNOSIS — B20 Human immunodeficiency virus [HIV] disease: Secondary | ICD-10-CM | POA: Diagnosis not present

## 2019-03-18 LAB — T-HELPER CELL (CD4) - (RCID CLINIC ONLY)
CD4 % Helper T Cell: 31 % — ABNORMAL LOW (ref 33–65)
CD4 T Cell Abs: 1036 /uL (ref 400–1790)

## 2019-03-19 ENCOUNTER — Encounter: Payer: Self-pay | Admitting: Internal Medicine

## 2019-03-20 LAB — CBC
HCT: 43.3 % (ref 38.5–50.0)
Hemoglobin: 14.9 g/dL (ref 13.2–17.1)
MCH: 30.7 pg (ref 27.0–33.0)
MCHC: 34.4 g/dL (ref 32.0–36.0)
MCV: 89.3 fL (ref 80.0–100.0)
MPV: 10.2 fL (ref 7.5–12.5)
Platelets: 256 10*3/uL (ref 140–400)
RBC: 4.85 10*6/uL (ref 4.20–5.80)
RDW: 12.5 % (ref 11.0–15.0)
WBC: 5.4 10*3/uL (ref 3.8–10.8)

## 2019-03-20 LAB — COMPREHENSIVE METABOLIC PANEL
AG Ratio: 1.2 (calc) (ref 1.0–2.5)
ALT: 18 U/L (ref 9–46)
AST: 18 U/L (ref 10–40)
Albumin: 4.2 g/dL (ref 3.6–5.1)
Alkaline phosphatase (APISO): 92 U/L (ref 36–130)
BUN: 11 mg/dL (ref 7–25)
CO2: 30 mmol/L (ref 20–32)
Calcium: 9.3 mg/dL (ref 8.6–10.3)
Chloride: 103 mmol/L (ref 98–110)
Creat: 1.08 mg/dL (ref 0.60–1.35)
Globulin: 3.4 g/dL (calc) (ref 1.9–3.7)
Glucose, Bld: 111 mg/dL — ABNORMAL HIGH (ref 65–99)
Potassium: 4.1 mmol/L (ref 3.5–5.3)
Sodium: 138 mmol/L (ref 135–146)
Total Bilirubin: 0.5 mg/dL (ref 0.2–1.2)
Total Protein: 7.6 g/dL (ref 6.1–8.1)

## 2019-03-20 LAB — RPR TITER: RPR Titer: 1:32 {titer} — ABNORMAL HIGH

## 2019-03-20 LAB — FLUORESCENT TREPONEMAL AB(FTA)-IGG-BLD: Fluorescent Treponemal ABS: REACTIVE — AB

## 2019-03-20 LAB — HIV-1 RNA QUANT-NO REFLEX-BLD
HIV 1 RNA Quant: <20 copies/mL
HIV-1 RNA Quant, Log: <1.3 Log copies/mL

## 2019-03-20 LAB — RPR: RPR Ser Ql: REACTIVE — AB

## 2019-03-31 ENCOUNTER — Encounter: Payer: Medicare Other | Admitting: Internal Medicine

## 2019-04-21 ENCOUNTER — Ambulatory Visit (INDEPENDENT_AMBULATORY_CARE_PROVIDER_SITE_OTHER): Payer: Medicare Other | Admitting: Internal Medicine

## 2019-04-21 ENCOUNTER — Encounter: Payer: Self-pay | Admitting: Internal Medicine

## 2019-04-21 ENCOUNTER — Other Ambulatory Visit: Payer: Self-pay

## 2019-04-21 DIAGNOSIS — B20 Human immunodeficiency virus [HIV] disease: Secondary | ICD-10-CM | POA: Diagnosis not present

## 2019-04-21 DIAGNOSIS — A539 Syphilis, unspecified: Secondary | ICD-10-CM

## 2019-04-21 DIAGNOSIS — K51311 Ulcerative (chronic) rectosigmoiditis with rectal bleeding: Secondary | ICD-10-CM

## 2019-04-21 NOTE — Assessment & Plan Note (Signed)
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  He may be serofast.

## 2019-04-21 NOTE — Assessment & Plan Note (Signed)
His infection remains under excellent, long-term control.  He will continue Biktarvy and follow-up after lab work in 6 months.

## 2019-04-21 NOTE — Progress Notes (Signed)
Patient Active Problem List   Diagnosis Date Noted  . 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    Priority: High  . Chlamydia infection 03/29/2018    Priority: High  . Human immunodeficiency virus (HIV) disease ([place]) 03/02/2006    Priority: High  . Pes planus 10/02/2018  . Left ankle pain 10/02/2018  . 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 Dyspnea on exertion 10/19/2016  . Poor dentition 09/21/2016  . Pulmonary nodule 08/17/2016  . Obesity (BMI 30.0-34.9) 04/20/2016  . Dyslipidemia 07/02/2012  . GERD (gastroesophageal reflux disease) 06/28/2010  . 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 ANXIETY 03/02/2006  . Depression 03/02/2006  . ALLERGIC RHINITIS 03/02/2006    Patient's Medications  New Prescriptions   No medications on file  Previous Medications   BALSALAZIDE (COLAZAL) 750 MG CAPSULE    Take 3 capsules (2,250 mg total) by mouth 3 (three) times daily.   BIKTARVY 50-200-25 MG TABS TABLET    TAKE 1 TABLET BY MOUTH EVERY DAY  Modified Medications   No medications on file  Discontinued Medications   NAPROXEN (NAPROXEN DR) 500 MG EC TABLET    Take 1 tablet (500 mg total) by mouth 2 (two) times daily with a meal.   PENICILLIN V POTASSIUM (VEETID) 500 MG TABLET    Take 1 tablet (500 mg total) by mouth 3 (three) times daily.    Subjective: Matthew Fitzpatrick is in for his routine HIV follow-up visit.  He has not had any problems obtaining, taking or tolerating Biktarvy and does not recall missing doses.  He is not on any other medications.  He says that he has not been sexually active since his last visit.  Review of Systems: Review of Systems  Constitutional: Negative for chills, fever and weight loss.  Respiratory: Negative for cough and shortness of breath.   Cardiovascular: Negative for chest pain.  Gastrointestinal: Negative for abdominal pain, heartburn, nausea and vomiting.  Psychiatric/Behavioral: Negative for  depression.    Past Medical History:  Diagnosis Date  . Acute conjunctivitis 02/12/2014  . Acute gastroenteritis 08/12/2015  . ANXIETY 03/02/2006   Qualifier: Diagnosis of  By: [name] MD, Fizza Scales    . Bronchitis 01/26/2011  . CAP (community acquired pneumonia)   . CHICKENPOX, HX OF 06/20/2006   Annotation: age 84 Qualifier: Diagnosis of  By: [name] MD, Jenny Reichmann    . Chlamydia infection 03/29/2018  . COUGH, CHRONIC 12/23/2008   Qualifier: Diagnosis of  By: [name] MD, Eri Mcevers    . Deaf   . DEAFNESS, 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   Qualifier: Diagnosis of  By: [name] MD, Piotr Christopher    . DEPRESSION 03/02/2006   Qualifier: Diagnosis of  By: [name] MD, Onika Gudiel    . Dyslipidemia 07/02/2012  . 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 FACIAL RASH 11/17/2008   Qualifier: Diagnosis of  By: [name] MD, Lizette Pazos    . GERD 07/20/2009   Qualifier: Diagnosis of  By: [name] MD, Cadell Gabrielson    . GERD (gastroesophageal reflux disease)   . HIV disease ([place])   . Obesity (BMI 30.0-34.9) 04/20/2016  . PEDICULOSIS PUBIS (PUBIC LOUSE) 06/20/2006   Annotation: 4/06 Qualifier: Diagnosis of  By: [name] MD, Tonni Mansour    . Poor dentition 09/21/2016  . 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 Pulmonary nodule   .  SINUSITIS, CHRONIC 02/18/2009   Qualifier: Diagnosis of  By: Tommy Medal MD, Roderic Scarce    . 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  . 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 Urinary frequency 04/06/2009   Qualifier: Diagnosis of  By: [name] MD, Haward Pope      Social History   Tobacco Use  . Smoking status: Never Smoker  . Smokeless tobacco: Never Used  Substance Use Topics  . Alcohol use: No    Alcohol/week: 0.0 standard drinks  . Drug use: No    Family History  Problem Relation Age of Onset  . Headache Mother   . Hypertension Mother   . Heart attack Father 45  . Sudden Cardiac Death Neg Hx   . Colon  cancer Neg Hx   . Liver disease Neg Hx   . Esophageal cancer Neg Hx   . Rectal cancer Neg Hx   . Stomach cancer Neg Hx     No Known Allergies  Health Maintenance  Topic Date Due  . TETANUS/TDAP  10/26/1998  . INFLUENZA VACCINE  09/28/2018  . HIV Screening  Completed    Objective:  Vitals:   04/21/19 1354  BP: 125/82  Pulse: 72  Temp: 98.4 F (36.9 C)  TempSrc: Oral  SpO2: 95%  Weight: 245 lb (111.1 kg)   Body mass index is 37.25 kg/m.  Physical Exam Constitutional:      Comments: He is in no distress.  He was examined with the aid of the sign language interpreter  Cardiovascular:     Rate and Rhythm: Normal rate and regular rhythm.     Heart sounds: No murmur.  Pulmonary:     Effort: Pulmonary effort is normal.     Breath sounds: Normal breath sounds.  Abdominal:     Palpations: Abdomen is soft.     Tenderness: There is no abdominal tenderness.  Musculoskeletal:        General: No swelling or tenderness.  Skin:    Findings: No rash.  Neurological:     General: No focal deficit present.  Psychiatric:        Mood and Affect: Mood normal.     Lab Results Lab Results  Component Value Date   WBC 5.4 03/17/2019   HGB 14.9 03/17/2019   HCT 43.3 03/17/2019   MCV 89.3 03/17/2019   PLT 256 03/17/2019    Lab Results  Component Value Date   CREATININE 1.08 03/17/2019   BUN 11 03/17/2019   NA 138 03/17/2019   K 4.1 03/17/2019   CL 103 03/17/2019   CO2 30 03/17/2019    Lab Results  Component Value Date   ALT 18 03/17/2019   AST 18 03/17/2019   ALKPHOS 134 (H) 08/13/2017   BILITOT 0.5 03/17/2019    Lab Results  Component Value Date   CHOL 218 (H) 03/02/2016   HDL 45 03/02/2016   LDLCALC 145 (H) 03/02/2016   TRIG 141 03/02/2016   CHOLHDL 4.8 03/02/2016   Lab Results  Component Value Date   LABRPR REACTIVE (A) 03/17/2019   RPRTITER 1:32 (H) 03/17/2019   HIV 1 RNA Quant (copies/mL)  Date Value  03/17/2019 <20 NOT DETECTED  08/27/2018 <20  NOT DETECTED  03/28/2018 <20 DETECTED (A)   CD4 T Cell Abs (/uL)  Date Value  03/17/2019 1,036  08/27/2018 1,103  03/28/2018 560     Problem List Items Addressed This Visit      High   Syphilis    His RPR remains elevated  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  He may be serofast.      Human immunodeficiency virus (HIV) disease ([place])    His infection remains under excellent, long-term control.  He will continue Biktarvy and follow-up after lab work in 6 months.      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           Michel Bickers, MD [facility] [phone] pager   [phone] cell 04/21/2019, 2:09 PM

## 2019-04-21 NOTE — Assessment & Plan Note (Signed)
His rectal bleeding resolved last year after treatment for chlamydia and syphilis.

## 2019-04-23 ENCOUNTER — Other Ambulatory Visit: Payer: Self-pay | Admitting: Internal Medicine

## 2019-04-23 DIAGNOSIS — B20 Human immunodeficiency virus [HIV] disease: Secondary | ICD-10-CM

## 2019-05-07 ENCOUNTER — Encounter: Payer: Self-pay | Admitting: Internal Medicine

## 2019-05-07 ENCOUNTER — Other Ambulatory Visit: Payer: Self-pay

## 2019-05-07 ENCOUNTER — Ambulatory Visit (INDEPENDENT_AMBULATORY_CARE_PROVIDER_SITE_OTHER): Payer: Medicare Other | Admitting: Internal Medicine

## 2019-05-07 VITALS — BP 110/88 | HR 70 | Temp 97.5°F | Ht 68.0 in | Wt 244.0 lb

## 2019-05-07 DIAGNOSIS — K51311 Ulcerative (chronic) rectosigmoiditis with rectal bleeding: Secondary | ICD-10-CM | POA: Diagnosis not present

## 2019-05-07 DIAGNOSIS — R0789 Other chest pain: Secondary | ICD-10-CM

## 2019-05-07 DIAGNOSIS — B20 Human immunodeficiency virus [HIV] disease: Secondary | ICD-10-CM | POA: Diagnosis not present

## 2019-05-07 DIAGNOSIS — R0600 Dyspnea, unspecified: Secondary | ICD-10-CM

## 2019-05-07 DIAGNOSIS — R911 Solitary pulmonary nodule: Secondary | ICD-10-CM

## 2019-05-07 DIAGNOSIS — E785 Hyperlipidemia, unspecified: Secondary | ICD-10-CM | POA: Diagnosis not present

## 2019-05-07 DIAGNOSIS — A749 Chlamydial infection, unspecified: Secondary | ICD-10-CM

## 2019-05-07 DIAGNOSIS — A539 Syphilis, unspecified: Secondary | ICD-10-CM | POA: Diagnosis not present

## 2019-05-07 DIAGNOSIS — H9193 Unspecified hearing loss, bilateral: Secondary | ICD-10-CM | POA: Diagnosis not present

## 2019-05-07 DIAGNOSIS — R0609 Other forms of dyspnea: Secondary | ICD-10-CM

## 2019-05-07 NOTE — Progress Notes (Signed)
Cardiology Office Note:    Date:  05/07/2019   ID:  Matthew Fitzpatrick, DOB [birth year], MRN [id number]  PCP:  [name], NP  Cardiologist:  Nelva Bush, MD  Electrophysiologist:  None   Referring MD: [name], NP   Chief Complaint: f/u DOE and palpitations  This visit was completed with the assistance of the ASL interpreter.  History of Present Illness:    Matthew Fitzpatrick is a 39 y.o. male with a history of dyslipidemia, HIV, congenital deafness. He was evaluated by Dr. Saunders Revel forshortness of breath and palpitations. Echo in 5/18 demonstrated normal LV function with normal diastolic function. Exercise tolerance test in 9/18 demonstrated no ischemic changes.  He ultimately underwent coronary CTA.  This was done 07/03/2017 and demonstrated a calcium score of 0 and normal coronary arteries.  There was an incidental right upper lobe pulmonary nodule noted measuring 4 mm. Patient was felt to be low risk and this was not recommended for follow up.   He feels well today. The patient denies chest pain, chest pressure, dyspnea at rest or with exertion, palpitations, PND, orthopnea, or leg swelling. Denies cough, fever, chills. Denies nausea, vomiting. Denies syncope or presyncope. Denies dizziness or lightheadedness. Denies snoring.   Past Medical History:  Diagnosis Date  . Acute conjunctivitis 02/12/2014  . Acute gastroenteritis 08/12/2015  . ANXIETY 03/02/2006   Qualifier: Diagnosis of  By: [name] MD, [name]    . Bronchitis 01/26/2011  . CAP (community acquired pneumonia)   . CHICKENPOX, HX OF 06/20/2006   Annotation: age 35 Qualifier: Diagnosis of  By: [name] MD, Jenny Reichmann    . Chlamydia infection 03/29/2018  . COUGH, CHRONIC 12/23/2008   Qualifier: Diagnosis of  By: [name] MD, [name]    . Deaf   . 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 DEPRESSION 03/02/2006   Qualifier: Diagnosis of  By: [name] MD, [name]    .  Dyslipidemia 07/02/2012  . 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 FACIAL RASH 11/17/2008   Qualifier: Diagnosis of  By: [name] MD, [name]    . GERD 07/20/2009   Qualifier: Diagnosis of  By: [name] MD, [name]    . GERD (gastroesophageal reflux disease)   . HIV disease ([place])   . Obesity (BMI 30.0-34.9) 04/20/2016  . PEDICULOSIS PUBIS (PUBIC LOUSE) 06/20/2006   Annotation: 4/06 Qualifier: Diagnosis of  By: [name] MD, [name]    . Poor dentition 09/21/2016  . 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 Pulmonary nodule   . SINUSITIS, CHRONIC 02/18/2009   Qualifier: Diagnosis of  By: Tommy Medal MD, Roderic Scarce    . 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  . 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 Urinary frequency 04/06/2009   Qualifier: Diagnosis of  By: [name] MD, [name]      Past Surgical History:  Procedure Laterality Date  . colonoscopy    . THROAT SURGERY    . UMBILICAL HERNIA REPAIR      Current Medications: Current Meds  Medication Sig  . BIKTARVY 50-200-25 MG TABS tablet TAKE 1 TABLET BY MOUTH EVERY DAY  . [DISCONTINUED] balsalazide (COLAZAL) 750 MG capsule Take 3 capsules (2,250 mg total) by mouth 3 (three) times daily.     Allergies:   Patient has no known allergies.   Social History  Socioeconomic History  . Marital status: Single    Spouse name: Not on file  . Number of children: 0  . Years of education: Not on file  . Highest education level: Not on file  Occupational History  . Not on file  Tobacco Use  . Smoking status: Never Smoker  . Smokeless tobacco: Never Used  Substance and Sexual Activity  . Alcohol use: No    Alcohol/week: 0.0 standard drinks  . Drug use: No  . Sexual activity: Never    Partners: Male    Birth control/protection: Condom    Comment: declined condoms  Other Topics Concern  . Not on file  Social History  Narrative   Lives alone   Mom lives with his sister in [place]   No children   Works in Pensions consultant in [place]   Enjoys working on [employer]   No pets   Social Determinants of Radio broadcast assistant Strain:   . Difficulty of Paying Living Expenses: Not on file  Food Insecurity:   . Worried About Charity fundraiser in the Last Year: Not on file  . Ran Out of Food in the Last Year: Not on file  Transportation Needs:   . Lack of Transportation (Medical): Not on file  . Lack of Transportation (Non-Medical): Not on file  Physical Activity:   . Days of Exercise per Week: Not on file  . Minutes of Exercise per Session: Not on file  Stress:   . Feeling of Stress : Not on file  Social Connections:   . Frequency of Communication with Friends and Family: Not on file  . Frequency of Social Gatherings with Friends and Family: Not on file  . Attends Religious Services: Not on file  . Active Member of Clubs or Organizations: Not on file  . Attends Archivist Meetings: Not on file  . Marital Status: Not on file     Family History: The patient's family history includes Headache in his mother; Heart attack (age of onset: 72) in his father; Hypertension in his mother. There is no history of Sudden Cardiac Death, Colon cancer, Liver disease, Esophageal cancer, Rectal cancer, or Stomach cancer.  ROS:   Please see the history of present illness.    All other systems reviewed and are negative.  EKGs/Labs/Other Studies Reviewed:    The following studies were reviewed today:  EKG:  NSR, LAD, incomplete RBBB, nonspecific T wave abnormality.  Recent Labs: 03/17/2019: ALT 18; BUN 11; Creat 1.08; Hemoglobin 14.9; Platelets 256; Potassium 4.1; Sodium 138  Recent Lipid Panel    Component Value Date/Time   CHOL 218 (H) 03/02/2016 1628   TRIG 141 03/02/2016 1628   HDL 45 03/02/2016 1628   CHOLHDL 4.8 03/02/2016 1628   VLDL 28 03/02/2016 1628    LDLCALC 145 (H) 03/02/2016 1628    Physical Exam:    VS:  BP 110/88   Pulse 70   Temp (!) 97.5 F (36.4 C)   Ht 5' 8"  (1.727 m)   Wt 244 lb (110.7 kg)   SpO2 94%   BMI 37.10 kg/m     Wt Readings from Last 5 Encounters:  05/07/19 244 lb (110.7 kg)  04/21/19 245 lb (111.1 kg)  10/17/18 226 lb (102.5 kg)  10/02/18 230 lb (104.3 kg)  08/23/18 230 lb (104.3 kg)     Constitutional: No acute distress Eyes: sclera non-icteric, normal conjunctiva and lids ENMT: normal dentition, moist mucous  membranes, deaf Cardiovascular: regular rhythm, normal rate, no murmurs. S1 and S2 normal. Radial pulses normal bilaterally. No jugular venous distention.  Respiratory: clear to auscultation bilaterally GI : normal bowel sounds, soft and nontender. No distention.   MSK: extremities warm, well perfused. No edema.  NEURO: grossly nonfocal exam, moves all extremities. PSYCH: alert and oriented x 3, normal mood and affect.      ASSESSMENT:    1. Dyspnea on exertion   2. Lung nodule   3. Atypical chest pain   4. Syphilis   5. Chlamydia infection   6. 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 Dyslipidemia   8. Bilateral hearing loss, unspecified hearing loss type    PLAN:    Dyspnea on exertion Atypical chest pain -  Resolved, normal CCTA  Syphilis Chlamydia infection HIV  -per ID, latent syphilis, on treatment for HIV  Dyslipidemia - monitored by ID and PCP in setting of antiretroviral therapy. Patient tells me he has been informed that this is stable on most recent labwork.   Bilateral hearing loss, congenital  - able to communicate by lip reading (challenging during COVID) and with ASL interpreter   Lung nodule - low risk, no follow up was recommended   Total time of encounter: 30 minutes total time of encounter, including 20 minutes spent in face-to-face patient care. This time includes coordination of care and counseling regarding above mentioned problem  list. Remainder of non-face-to-face time involved reviewing chart documents/testing relevant to the patient encounter and documentation in the medical record. I have independently reviewed documentation from referring provider.   Cherlynn Kaiser, MD [place]  [facility]    Medication Adjustments/Labs and Tests Ordered: Current medicines are reviewed at length with the patient today.  Concerns regarding medicines are outlined above.  No orders of the defined types were placed in this encounter.  No orders of the defined types were placed in this encounter.   Patient Instructions  Medication Instructions:  Continue same medications    Lab Work: None ordered    Testing/Procedures: None ordered   Follow-Up: At [facility], you and your health needs are our priority.  As part of our continuing mission to provide you with exceptional heart care, we have created designated Provider Care Teams.  These Care Teams include your primary Cardiologist (physician) and Advanced Practice Providers (APPs -  Physician Assistants and Nurse Practitioners) who all work together to provide you with the care you need, when you need it.  We recommend signing up for the patient portal called "MyChart".  Sign up information is provided on this After Visit Summary.  MyChart is used to connect with patients for Virtual Visits (Telemedicine).  Patients are able to view lab/test results, encounter notes, upcoming appointments, etc.  Non-urgent messages can be sent to your provider as well.   To learn more about what you can do with MyChart, go to [email or web address].    Your next appointment:  As Needed

## 2019-05-07 NOTE — Patient Instructions (Signed)
Medication Instructions:  Continue same medications    Lab Work: None ordered    Testing/Procedures: None ordered   Follow-Up: At [facility], you and your health needs are our priority.  As part of our continuing mission to provide you with exceptional heart care, we have created designated Provider Care Teams.  These Care Teams include your primary Cardiologist (physician) and Advanced Practice Providers (APPs -  Physician Assistants and Nurse Practitioners) who all work together to provide you with the care you need, when you need it.  We recommend signing up for the patient portal called "MyChart".  Sign up information is provided on this After Visit Summary.  MyChart is used to connect with patients for Virtual Visits (Telemedicine).  Patients are able to view lab/test results, encounter notes, upcoming appointments, etc.  Non-urgent messages can be sent to your provider as well.   To learn more about what you can do with MyChart, go to [email or web address].    Your next appointment:  As Needed

## 2019-06-24 ENCOUNTER — Encounter: Payer: Self-pay | Admitting: Family

## 2019-06-27 ENCOUNTER — Ambulatory Visit (INDEPENDENT_AMBULATORY_CARE_PROVIDER_SITE_OTHER): Payer: Medicare Other | Admitting: Family

## 2019-06-27 ENCOUNTER — Other Ambulatory Visit: Payer: Self-pay

## 2019-06-27 ENCOUNTER — Encounter: Payer: Self-pay | Admitting: Family

## 2019-06-27 VITALS — BP 116/80 | HR 71 | Temp 97.8°F | Resp 16 | Ht 68.0 in | Wt 233.0 lb

## 2019-06-27 DIAGNOSIS — G4700 Insomnia, unspecified: Secondary | ICD-10-CM | POA: Diagnosis not present

## 2019-06-27 NOTE — Patient Instructions (Signed)
Please work on avoiding naps, no screens for 90 minutes before bed, do not fall asleep with the TV on. Send me a message via mychart in 1 week to let me know if this is helping. If not we can consider adding a prescription medication.   Insomnia Insomnia is a sleep disorder that makes it difficult to fall asleep or stay asleep. Insomnia can cause fatigue, low energy, difficulty concentrating, mood swings, and poor performance at work or school. There are three different ways to classify insomnia:  Difficulty falling asleep.  Difficulty staying asleep.  Waking up too early in the morning. Any type of insomnia can be long-term (chronic) or short-term (acute). Both are common. Short-term insomnia usually lasts for three months or less. Chronic insomnia occurs at least three times a week for longer than three months. What are the causes? Insomnia may be caused by another condition, situation, or substance, such as:  Anxiety.  Certain medicines.  Gastroesophageal reflux disease (GERD) or other gastrointestinal conditions.  Asthma or other breathing conditions.  Restless legs syndrome, sleep apnea, or other sleep disorders.  Chronic pain.  Menopause.  Stroke.  Abuse of alcohol, tobacco, or illegal drugs.  Mental health conditions, such as depression.  Caffeine.  Neurological disorders, such as Alzheimer's disease.  An overactive thyroid (hyperthyroidism). Sometimes, the cause of insomnia may not be known. What increases the risk? Risk factors for insomnia include:  Gender. Women are affected more often than men.  Age. Insomnia is more common as you get older.  Stress.  Lack of exercise.  Irregular work schedule or working night shifts.  Traveling between different time zones.  Certain medical and mental health conditions. What are the signs or symptoms? If you have insomnia, the main symptom is having trouble falling asleep or having trouble staying asleep. This  may lead to other symptoms, such as:  Feeling fatigued or having low energy.  Feeling nervous about going to sleep.  Not feeling rested in the morning.  Having trouble concentrating.  Feeling irritable, anxious, or depressed. How is this diagnosed? This condition may be diagnosed based on:  Your symptoms and medical history. Your health care provider may ask about: ? Your sleep habits. ? Any medical conditions you have. ? Your mental health.  A physical exam. How is this treated? Treatment for insomnia depends on the cause. Treatment may focus on treating an underlying condition that is causing insomnia. Treatment may also include:  Medicines to help you sleep.  Counseling or therapy.  Lifestyle adjustments to help you sleep better. Follow these instructions at home: Eating and drinking   Limit or avoid alcohol, caffeinated beverages, and cigarettes, especially close to bedtime. These can disrupt your sleep.  Do not eat a large meal or eat spicy foods right before bedtime. This can lead to digestive discomfort that can make it hard for you to sleep. Sleep habits   Keep a sleep diary to help you and your health care provider figure out what could be causing your insomnia. Write down: ? When you sleep. ? When you wake up during the night. ? How well you sleep. ? How rested you feel the next day. ? Any side effects of medicines you are taking. ? What you eat and drink.  Make your bedroom a dark, comfortable place where it is easy to fall asleep. ? Put up shades or blackout curtains to block light from outside. ? Use a white noise machine to block noise. ? Keep the temperature  cool.  Limit screen use before bedtime. This includes: ? Watching TV. ? Using your smartphone, tablet, or computer.  Stick to a routine that includes going to bed and waking up at the same times every day and night. This can help you fall asleep faster. Consider making a quiet activity, such  as reading, part of your nighttime routine.  Try to avoid taking naps during the day so that you sleep better at night.  Get out of bed if you are still awake after 15 minutes of trying to sleep. Keep the lights down, but try reading or doing a quiet activity. When you feel sleepy, go back to bed. General instructions  Take over-the-counter and prescription medicines only as told by your health care provider.  Exercise regularly, as told by your health care provider. Avoid exercise starting several hours before bedtime.  Use relaxation techniques to manage stress. Ask your health care provider to suggest some techniques that may work well for you. These may include: ? Breathing exercises. ? Routines to release muscle tension. ? Visualizing peaceful scenes.  Make sure that you drive carefully. Avoid driving if you feel very sleepy.  Keep all follow-up visits as told by your health care provider. This is important. Contact a health care provider if:  You are tired throughout the day.  You have trouble in your daily routine due to sleepiness.  You continue to have sleep problems, or your sleep problems get worse. Get help right away if:  You have serious thoughts about hurting yourself or someone else. If you ever feel like you may hurt yourself or others, or have thoughts about taking your own life, get help right away. You can go to your nearest emergency department or call:  Your local emergency services (911 in the [place]  A suicide crisis helpline, such as the [place] at (629)683-8353. This is open 24 hours a day. Summary  Insomnia is a sleep disorder that makes it difficult to fall asleep or stay asleep.  Insomnia can be long-term (chronic) or short-term (acute).  Treatment for insomnia depends on the cause. Treatment may focus on treating an underlying condition that is causing insomnia.  Keep a sleep diary to help you and your health care  provider figure out what could be causing your insomnia. This information is not intended to replace advice given to you by your health care provider. Make sure you discuss any questions you have with your health care provider. Document Revised: 01/26/2017 Document Reviewed: 11/23/2016 Elsevier Patient Education  2020 [employer].

## 2019-06-27 NOTE — Progress Notes (Signed)
Subjective:    Patient ID: Matthew Fitzpatrick, male    DOB: [birth year], 39 y.o.   MRN: [id number]  HPI   Patient is a 39 yr old male who presents today with c/o insomnia. He is accompanied by a sign language intepreter today. He reports that he has had trouble staying asleep for 1 week.  As a result he has felt quite fatigued. No new stress. Usually goes to bed at 10 or 11 PM, wakes up around 1-2, lays awake for several hours. Then wakes up very tired.  He reports that he does not drink much caffeine. Reports that he sleeps for about 1 hour in the afternoon.  He watches tv before bed.  He sleeps with the television on.  It turns itself off on a sleep timer.  He has tried benadryl and melatonin without improvement in his symptoms.    Wt Readings from Last 3 Encounters:  06/27/19 233 lb (105.7 kg)  05/07/19 244 lb (110.7 kg)  04/21/19 245 lb (111.1 kg)       Review of Systems See HPI  Past Medical History:  Diagnosis Date  . Acute conjunctivitis 02/12/2014  . Acute gastroenteritis 08/12/2015  . ANXIETY 03/02/2006   Qualifier: Diagnosis of  By: [name] MD, [name]    . Bronchitis 01/26/2011  . CAP (community acquired pneumonia)   . CHICKENPOX, HX OF 06/20/2006   Annotation: age 62 Qualifier: Diagnosis of  By: [name] MD, Jenny Reichmann    . Chlamydia infection 03/29/2018  . COUGH, CHRONIC 12/23/2008   Qualifier: Diagnosis of  By: [name] MD, [name]    . Deaf   . 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 DEPRESSION 03/02/2006   Qualifier: Diagnosis of  By: [name] MD, [name]    . Dyslipidemia 07/02/2012  . 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 FACIAL RASH 11/17/2008   Qualifier: Diagnosis of  By: [name] MD, [name]    . GERD 07/20/2009   Qualifier: Diagnosis of  By: [name] MD, [name]    . GERD (gastroesophageal reflux disease)   . HIV disease ([place])   . Obesity (BMI 30.0-34.9) 04/20/2016  .  PEDICULOSIS PUBIS (PUBIC LOUSE) 06/20/2006   Annotation: 4/06 Qualifier: Diagnosis of  By: [name] MD, [name]    . Poor dentition 09/21/2016  . 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 Pulmonary nodule   . SINUSITIS, CHRONIC 02/18/2009   Qualifier: Diagnosis of  By: Tommy Medal MD, Roderic Scarce    . 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  . 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 Urinary frequency 04/06/2009   Qualifier: Diagnosis of  By: [name] MD, [name]       Social History   Socioeconomic History  . Marital status: Single    Spouse name: Not on file  . Number of children: 0  . Years of education: Not on file  . Highest education level: Not on file  Occupational History  . Not on file  Tobacco Use  . Smoking status: Never Smoker  . Smokeless tobacco: Never Used  Substance and Sexual Activity  . Alcohol use: No    Alcohol/week: 0.0 standard drinks  . Drug use: No  . Sexual activity: Never    Partners: Male    Birth control/protection: Condom    Comment: declined condoms  Other Topics Concern  . 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 Difficulty of Paying Living Expenses:   Food Insecurity:   . Worried About Charity fundraiser in the Last Year:   . Arboriculturist in the Last Year:   Transportation Needs:   . Film/video editor (Medical):   Marland Kitchen Lack of Transportation (Non-Medical):   Physical Activity:   . Days of Exercise per Week:   . Minutes of Exercise per Session:   Stress:   . Feeling of Stress :   Social Connections:   . Frequency of Communication with Friends and Family:   . Frequency of Social Gatherings with Friends and Family:   . Attends  Religious Services:   . Active Member of Clubs or Organizations:   . Attends Archivist Meetings:   Marland Kitchen Marital Status:   Intimate Partner Violence:   . Fear of Current or Ex-Partner:   . Emotionally Abused:   Marland Kitchen Physically Abused:   . Sexually Abused:     Past Surgical History:  Procedure Laterality Date  . colonoscopy    . THROAT SURGERY    . UMBILICAL HERNIA REPAIR      Family History  Problem Relation Age of Onset  . Headache Mother   . Hypertension Mother   . Heart attack Father 70  . Sudden Cardiac Death Neg Hx   . Colon cancer Neg Hx   . Liver disease Neg Hx   . Esophageal cancer Neg Hx   . Rectal cancer Neg Hx   . Stomach cancer Neg Hx     No Known Allergies  Current Outpatient Medications on File Prior to Visit  Medication Sig Dispense Refill  . BIKTARVY 50-200-25 MG TABS tablet TAKE 1 TABLET BY MOUTH EVERY DAY 30 tablet 5   No current facility-administered medications on file prior to visit.    BP 116/80 (BP Location: Right Arm, Patient Position: Sitting, Cuff Size: Large)   Pulse 71   Temp 97.8 F (36.6 C) (Temporal)   Resp 16   Ht 5' 8"  (1.727 m)   Wt 233 lb (105.7 kg)   SpO2 97%   BMI 35.43 kg/m       Objective:   Physical Exam Constitutional:      General: He is not in acute distress.    Appearance: He is well-developed.  HENT:     Head: Normocephalic and atraumatic.  Cardiovascular:     Rate and Rhythm: Normal rate and regular rhythm.     Heart sounds: No murmur.  Pulmonary:     Effort: Pulmonary effort is normal. No respiratory distress.     Breath sounds: Normal breath sounds. No wheezing or rales.  Skin:    General: Skin is warm and dry.  Neurological:     Mental Status: He is alert and oriented to person, place, and time.  Psychiatric:        Behavior: Behavior normal.        Thought Content: Thought content normal.           Assessment & Plan:  Insomnia- discussed good sleep hygiene. Patient is advised as  follows:  Work on  avoiding naps, no screens for 90 minutes before bed, do not fall asleep with the TV on. Send me a message via mychart in 1 week to let me know if this is helping. If not we can consider adding a prescription medication.

## 2019-07-29 IMAGING — CT CT ABD-PELV W/ CM
2 of 4 series · 17 of 46 positions shown, 19 images · IV contrast (iopamidol)
Comparison: 08/02/2016

CLINICAL DATA: Diarrhea and vomiting. History of urinary frequency,
proctitis, HIV, and gastroesophageal reflux disease.

EXAM:
CT ABDOMEN AND PELVIS WITH CONTRAST
TECHNIQUE: Multidetector CT imaging of the abdomen and pelvis was performed
using the standard protocol following bolus administration of
intravenous contrast.
CONTRAST:  100mL [id number] IOPAMIDOL ([id number]) INJECTION 61%

[Series 3: a/p w/ 5mm · axial · 0.93mm/px · z∈[+705,+1130]mm · 14 of 95 slices shown, 16 images]
[im 5/95  soft-tissue]
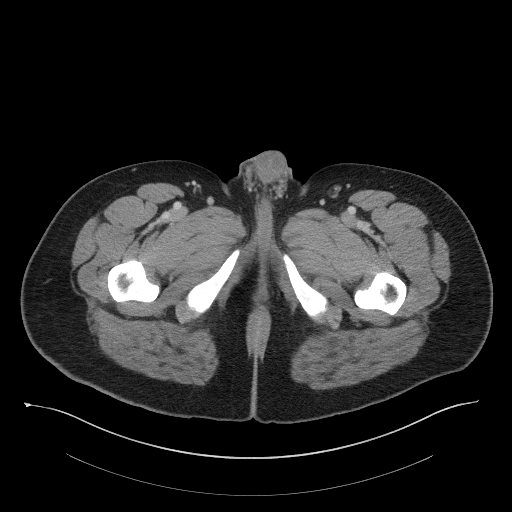
[im 5/95  bone]
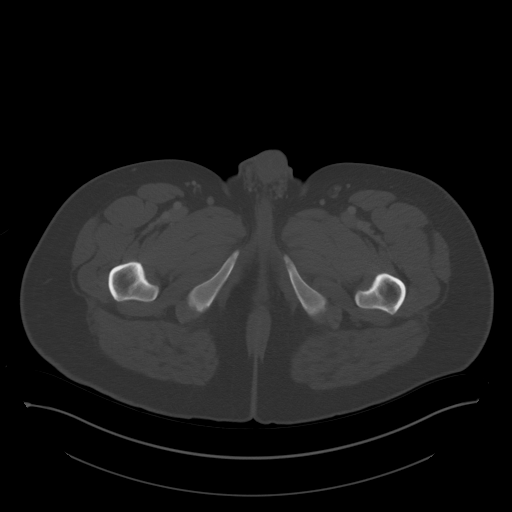
[im 13/95  soft-tissue]
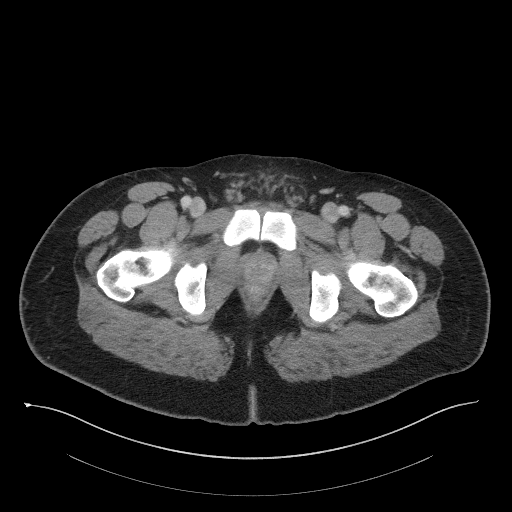
[im 18/95  soft-tissue]
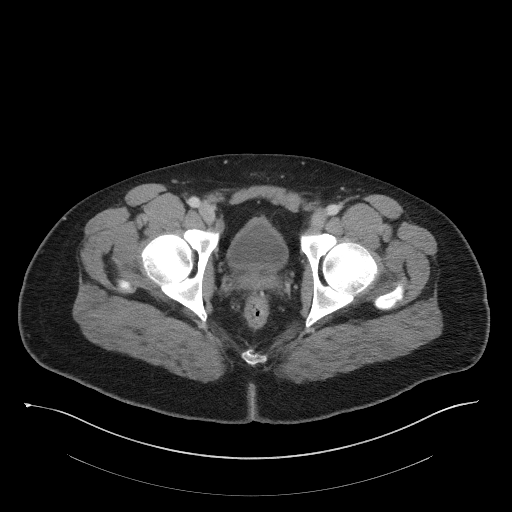
[im 26/95  soft-tissue]
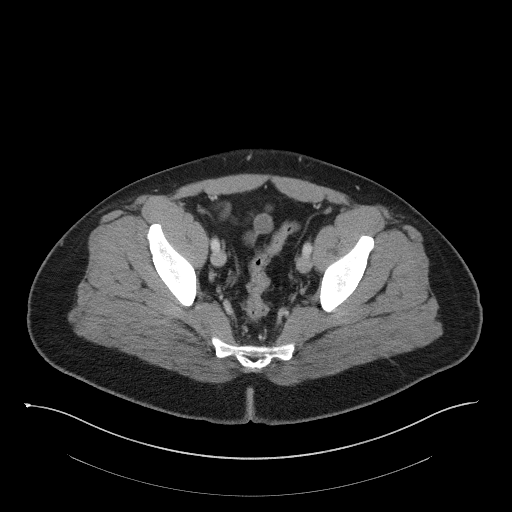
[im 30/95  soft-tissue]
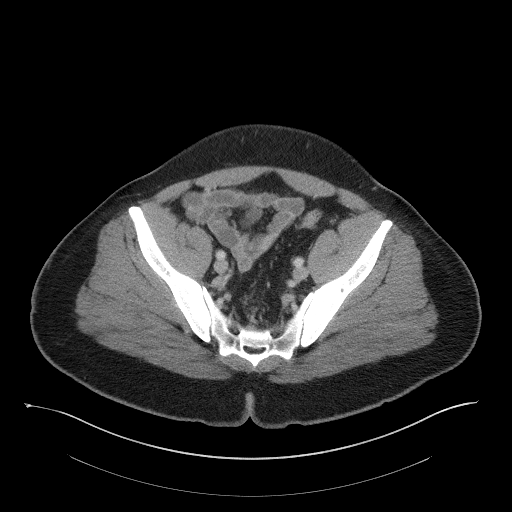
[im 39/95  soft-tissue]
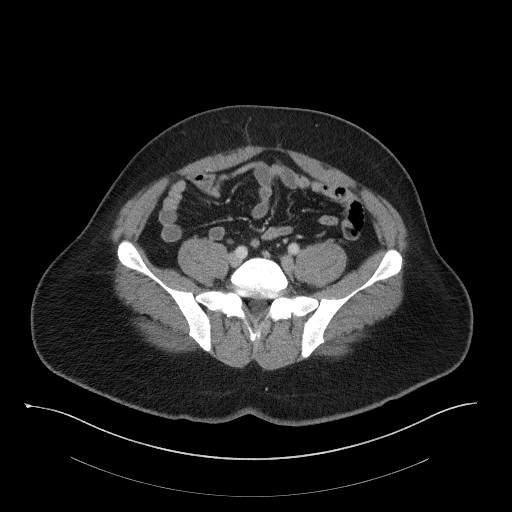
[im 43/95  soft-tissue]
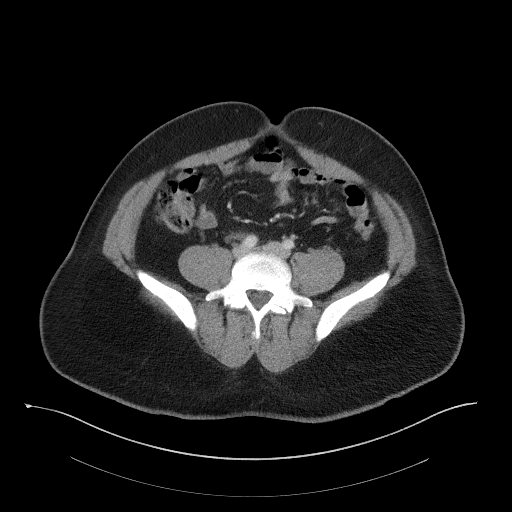
[im 52/95  soft-tissue]
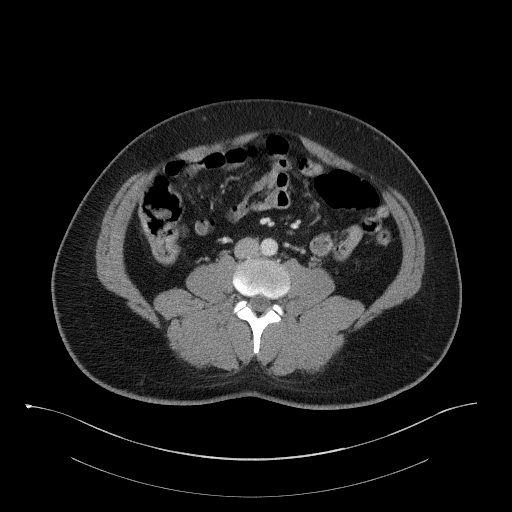
[im 56/95  soft-tissue]
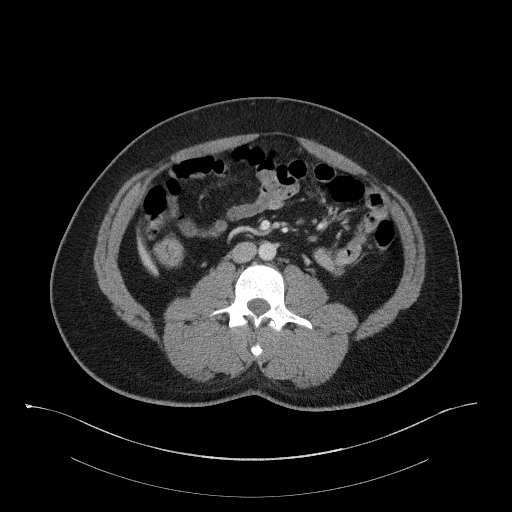
[im 56/95  bone]
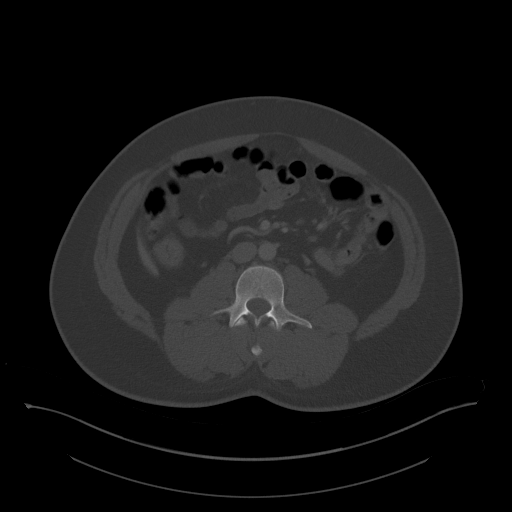
[im 65/95  soft-tissue]
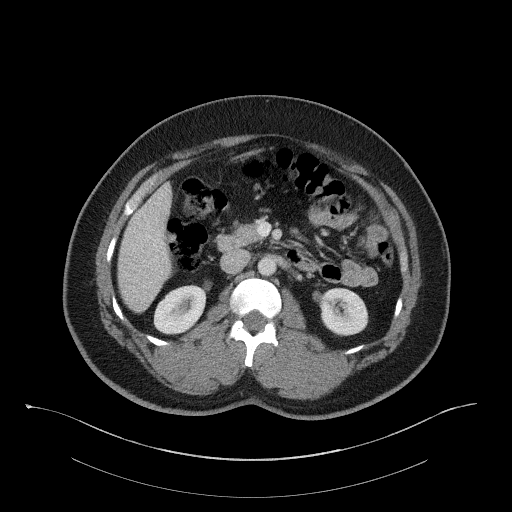
[im 69/95  soft-tissue]
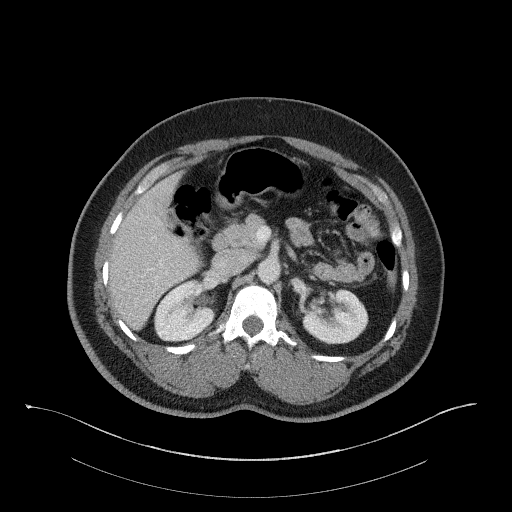
[im 77/95  soft-tissue]
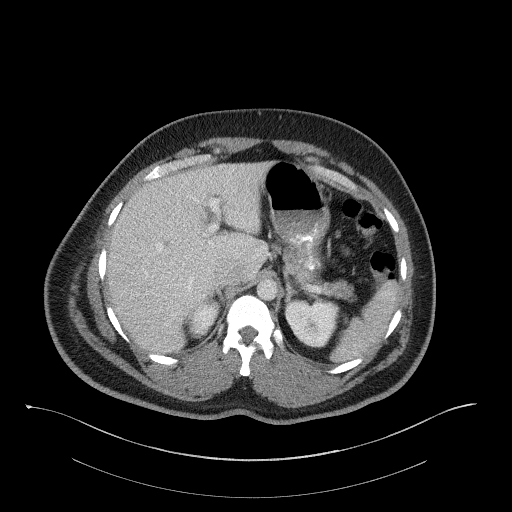
[im 82/95  soft-tissue]
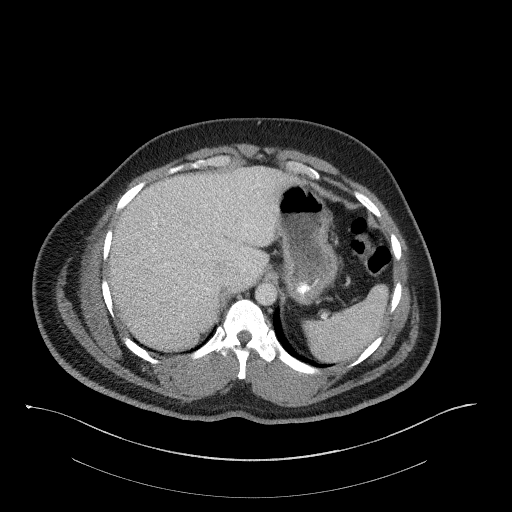
[im 90/95  soft-tissue]
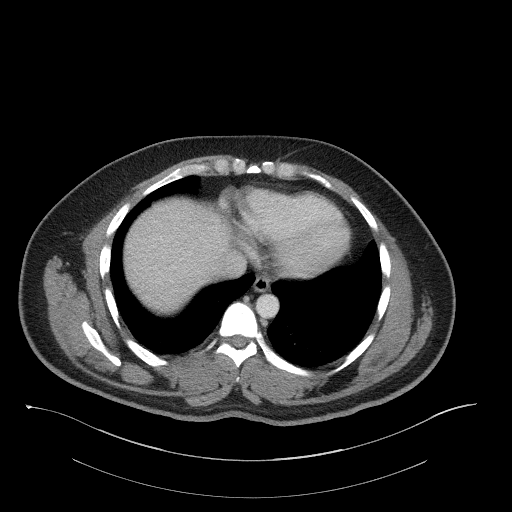

[Series 6: a/p w/ cor · coronal · 0.87mm/px · 3 of 151 slices shown]
[im 51/151  soft-tissue]
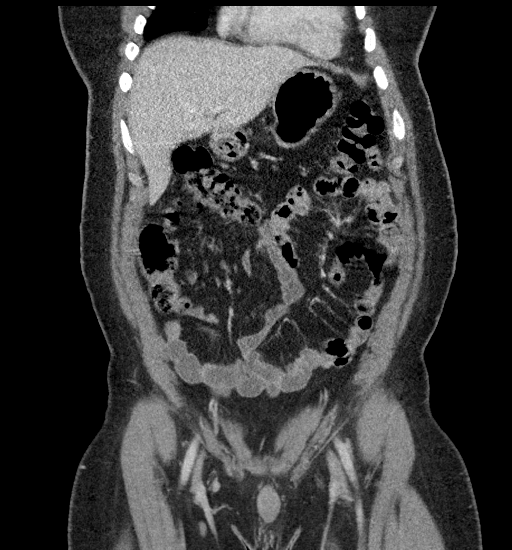
[im 67/151  soft-tissue]
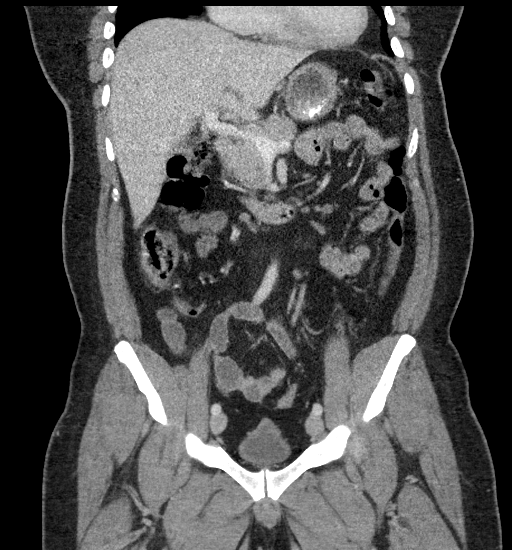
[im 84/151  soft-tissue]
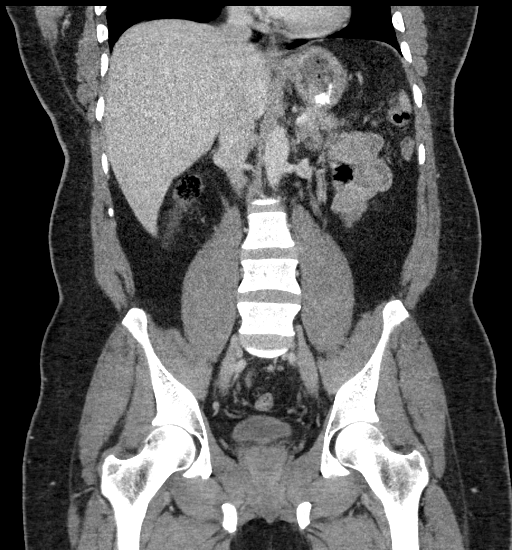

[17 of 46 positions shown; findings below may reference images not displayed]

FINDINGS: Lower chest: Lung bases are clear.

Hepatobiliary: Gallbladder is contracted. No bile duct dilatation.
No focal liver lesions.

Pancreas: Unremarkable. No pancreatic ductal dilatation or
surrounding inflammatory changes.

Spleen: Normal in size without focal abnormality.

Adrenals/Urinary Tract: Adrenal glands are unremarkable. Kidneys are
normal, without renal calculi, focal lesion, or hydronephrosis.
Bladder is unremarkable.

Stomach/Bowel: Stomach is within normal limits. Appendix appears
normal. No evidence of bowel wall thickening, distention, or
inflammatory changes.

Vascular/Lymphatic: No significant vascular findings are present. No
enlarged abdominal or pelvic lymph nodes.

Reproductive: Prostate is unremarkable.

Other: No abdominal wall hernia or abnormality. No abdominopelvic
ascites.

Musculoskeletal: No acute or significant osseous findings.
IMPRESSION: No acute process demonstrated in the abdomen or pelvis. No evidence
of bowel obstruction or inflammation.

## 2019-09-30 ENCOUNTER — Emergency Department (HOSPITAL_BASED_OUTPATIENT_CLINIC_OR_DEPARTMENT_OTHER)
Admission: EM | Admit: 2019-09-30 | Discharge: 2019-09-30 | Disposition: A | Discharge status: Home / Self Care | Payer: Medicare Other | Attending: Emergency Medicine | Admitting: Emergency Medicine

## 2019-09-30 ENCOUNTER — Other Ambulatory Visit: Payer: Self-pay

## 2019-09-30 ENCOUNTER — Encounter (HOSPITAL_BASED_OUTPATIENT_CLINIC_OR_DEPARTMENT_OTHER): Payer: Self-pay

## 2019-09-30 DIAGNOSIS — H53149 Visual discomfort, unspecified: Secondary | ICD-10-CM | POA: Diagnosis not present

## 2019-09-30 DIAGNOSIS — H40051 Ocular hypertension, right eye: Secondary | ICD-10-CM | POA: Diagnosis not present

## 2019-09-30 DIAGNOSIS — R519 Headache, unspecified: Secondary | ICD-10-CM | POA: Diagnosis not present

## 2019-09-30 DIAGNOSIS — H5710 Ocular pain, unspecified eye: Secondary | ICD-10-CM | POA: Diagnosis not present

## 2019-09-30 DIAGNOSIS — H538 Other visual disturbances: Secondary | ICD-10-CM | POA: Diagnosis not present

## 2019-09-30 MED ORDER — TIMOLOL MALEATE 0.25 % OP SOLG: 1.0000 [drp] | Freq: Three times a day (TID) | OPHTHALMIC | 0 refills | Status: DC

## 2019-09-30 MED ORDER — DORZOLAMIDE HCL 2 % OP SOLN
1.0000 [drp] | Freq: Three times a day (TID) | OPHTHALMIC | 1 refills | Status: AC
Start: 2019-09-30 — End: 2019-10-02

## 2019-09-30 MED ORDER — DORZOLAMIDE HCL 2 % OP SOLN
1.0000 [drp] | Freq: Three times a day (TID) | OPHTHALMIC | Status: DC
  Filled 2019-09-30: qty 10

## 2019-09-30 MED ORDER — BRIMONIDINE TARTRATE 0.15 % OP SOLN
1.0000 [drp] | Freq: Three times a day (TID) | OPHTHALMIC | 0 refills | Status: DC
Start: 2019-09-30 — End: 2020-08-17

## 2019-09-30 MED ORDER — TETRACAINE HCL 0.5 % OP SOLN
1.0000 [drp] | Freq: Once | OPHTHALMIC | Status: DC
  Filled 2019-09-30: qty 4

## 2019-09-30 MED ORDER — TIMOLOL MALEATE 0.25 % OP SOLG: 1.0000 [drp] | Freq: Two times a day (BID) | OPHTHALMIC | 0 refills | Status: DC

## 2019-09-30 MED ORDER — FLUORESCEIN SODIUM 1 MG OP STRP
1.0000 | ORAL_STRIP | Freq: Once | OPHTHALMIC | Status: DC
  Filled 2019-09-30: qty 1

## 2019-09-30 MED ORDER — TIMOLOL MALEATE 0.5 % OP SOLN
1.0000 [drp] | Freq: Three times a day (TID) | OPHTHALMIC | Status: DC
  Administered 2019-09-30: 1 [drp] via OPHTHALMIC
  Filled 2019-09-30: qty 5

## 2019-09-30 MED ORDER — BRIMONIDINE TARTRATE 0.15 % OP SOLN
1.0000 [drp] | Freq: Three times a day (TID) | OPHTHALMIC | Status: DC
  Filled 2019-09-30: qty 5

## 2019-09-30 NOTE — ED Provider Notes (Signed)
[facility] EMERGENC[facility] DEPART[facility] Provider Note   CSN: [id number] Arrival date & time: 09/30/19  1200     History Chief Complaint  Patient presents with  . Eye Problem    Matthew Fitzpatrick is a 39 y.o. male.  Patient interviewed through ASL interpreter presents for 3 days of foggy vision. States that he woke up with foggy/blurred vision. He bought eyedrops to clear his vision but the eyedrops caused eye pain and did not improve his vision.          Past Medical History:  Diagnosis Date  . Acute conjunctivitis 02/12/2014  . Acute gastroenteritis 08/12/2015  . ANXIETY 03/02/2006   Qualifier: Diagnosis of  By: [name] MD, [name]    . Bronchitis 01/26/2011  . CAP (community acquired pneumonia)   . CHICKENPOX, HX OF 06/20/2006   Annotation: age 28 Qualifier: Diagnosis of  By: [name] MD, Jenny Reichmann    . Chlamydia infection 03/29/2018  . COUGH, CHRONIC 12/23/2008   Qualifier: Diagnosis of  By: [name] MD, [name]    . Deaf   . 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 DEPRESSION 03/02/2006   Qualifier: Diagnosis of  By: [name] MD, [name]    . Dyslipidemia 07/02/2012  . 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 FACIAL RASH 11/17/2008   Qualifier: Diagnosis of  By: [name] MD, [name]    . GERD 07/20/2009   Qualifier: Diagnosis of  By: [name] MD, [name]    . GERD (gastroesophageal reflux disease)   . HIV disease ([place])   . Obesity (BMI 30.0-34.9) 04/20/2016  . PEDICULOSIS PUBIS (PUBIC LOUSE) 06/20/2006   Annotation: 4/06 Qualifier: Diagnosis of  By: [name] MD, [name]    . Poor dentition 09/21/2016  . 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 Pulmonary nodule   . SINUSITIS, CHRONIC 02/18/2009   Qualifier: Diagnosis of  By: Tommy Medal MD, Roderic Scarce    . 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  . Ulcerative proctosigmoiditis, with rectal  bleeding ([place]) 03/13/2018  . Urinary frequency 04/06/2009   Qualifier: Diagnosis of  By: [name] MD, [name]      Patient Active Problem List   Diagnosis Date Noted  . Pes planus 10/02/2018  . Left ankle pain 10/02/2018  . 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  . Chlamydia infection 03/29/2018  . 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 Dyspnea on exertion 10/19/2016  . Poor dentition 09/21/2016  . Pulmonary nodule 08/17/2016  . Obesity (BMI 30.0-34.9) 04/20/2016  . Dyslipidemia 07/02/2012  . GERD (gastroesophageal reflux disease) 06/28/2010  . 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 Hearing loss 03/26/2006  . Human immunodeficiency virus (HIV) disease ([place]) 03/02/2006  . ANXIETY 03/02/2006  . Depression 03/02/2006  . ALLERGIC RHINITIS 03/02/2006    Past Surgical History:  Procedure Laterality Date  . colonoscopy    . THROAT SURGERY    . UMBILICAL HERNIA REPAIR         Family History  Problem Relation Age of Onset  . Headache Mother   . Hypertension Mother   . Heart attack Father 35  . Sudden Cardiac Death Neg Hx   . Colon cancer Neg Hx   . Liver disease Neg Hx   . Esophageal cancer Neg Hx   . Rectal cancer Neg Hx   . Stomach cancer Neg Hx  Social History   Tobacco Use  . Smoking status: Never Smoker  . Smokeless tobacco: Never Used  Vaping Use  . Vaping Use: Never used  Substance Use Topics  . Alcohol use: No    Alcohol/week: 0.0 standard drinks  . Drug use: No    Home Medications Prior to Admission medications   Medication Sig Start Date End Date Taking? Authorizing Provider  BIKTARVY 50-200-25 MG TABS tablet TAKE 1 TABLET BY MOUTH EVERY DAY 04/23/19   Michel Bickers, MD    Allergies    Patient has no known allergies.  Review of Systems   Review of Systems  Constitutional: Negative for chills and fever.  HENT: Negative for drooling, rhinorrhea and sinus pain.   Eyes: Positive for photophobia, pain, discharge and visual  disturbance.  Respiratory: Negative for cough and chest tightness.   Cardiovascular: Negative for chest pain and palpitations.  Gastrointestinal: Negative for abdominal distention, abdominal pain, diarrhea and nausea.  Endocrine: Negative for polydipsia, polyphagia and polyuria.  Genitourinary: Negative for dysuria, flank pain and frequency.  Musculoskeletal: Negative for back pain and joint swelling.  Skin: Negative for pallor and rash.  Neurological: Positive for headaches. Negative for dizziness, syncope, light-headedness and numbness.  Hematological: Negative for adenopathy.  Psychiatric/Behavioral: Negative for agitation and confusion.    Physical Exam Updated Vital Signs BP 135/83 (BP Location: Right Arm)   Pulse 69   Temp 98.4 F (36.9 C) (Oral)   Resp 19   SpO2 96%   Physical Exam Constitutional:      Appearance: Normal appearance.  HENT:     Head: Normocephalic and atraumatic.     Right Ear: Tympanic membrane normal.     Left Ear: Tympanic membrane normal.     Nose: Nose normal.     Mouth/Throat:     Mouth: Mucous membranes are moist.     Pharynx: Oropharynx is clear.  Eyes:     General:        Right eye: No discharge.        Left eye: No discharge.     Intraocular pressure: Right eye pressure is 46 mmHg. Left eye pressure is 13 mmHg. Measurements were taken using an automated tonometer.    Extraocular Movements: Extraocular movements intact.     Pupils: Pupils are equal, round, and reactive to light.     Comments: No increased uptake of fluorescein stain, conjunctival injection of right eye  Cardiovascular:     Rate and Rhythm: Normal rate and regular rhythm.     Pulses: Normal pulses.     Heart sounds: Normal heart sounds.  Pulmonary:     Effort: Pulmonary effort is normal.     Breath sounds: Normal breath sounds.  Abdominal:     General: Abdomen is flat. Bowel sounds are normal.     Palpations: Abdomen is soft.  Musculoskeletal:        General: Normal  range of motion.     Cervical back: Normal range of motion and neck supple.  Skin:    Capillary Refill: Capillary refill takes less than 2 seconds.  Neurological:     General: No focal deficit present.     Mental Status: He is alert and oriented to person, place, and time. Mental status is at baseline.     ED Results / Procedures / Treatments   Labs (all labs ordered are listed, but only abnormal results are displayed) Labs Reviewed - No data to display  EKG None  Radiology No results  found.  Procedures Procedures (including critical care time)  Medications Ordered in ED Medications  fluorescein ophthalmic strip 1 strip (has no administration in time range)  tetracaine (PONTOCAINE) 0.5 % ophthalmic solution 1 drop (has no administration in time range)    ED Course  I have reviewed the triage vital signs and the nursing notes.  Pertinent labs & imaging results that were available during my care of the patient were reviewed by me and considered in my medical decision making (see chart for details).    MDM Rules/Calculators/A&P                         39 y/o male presenting for blurred vision onset 3 days ago initially without pain but now with pain with eye movements after using OTC eyedrops. Afebrile, normotensive with eye exam significant for increased intraocular pressure of 46 concerning for corneal edema and glaucoma. Spoke to Ophthalmology Dr. Jamse Belfast concerning increased interocular pressure and possible glaucoma. Recommended Timolol, dorzolamide, and brimonidine and follow up in office tomorrow morning.  Final Clinical Impression(s) / ED Diagnoses Final diagnoses:  None    Rx / DC Orders ED Discharge Orders    None       Iona Beard, MD 09/30/19 Marko Stai    Gareth Morgan, MD 09/30/19 2232

## 2019-09-30 NOTE — ED Notes (Signed)
Called Dr Zenia Resides office to verify sign interpreter for 0800 appointment, appointment set with interpreter at 0800.

## 2019-09-30 NOTE — ED Notes (Signed)
ED Provider at bedside for eye exam

## 2019-09-30 NOTE — ED Triage Notes (Signed)
Through ASL interpreter-pt c/o blurred vision to right eye "white fog" x 3-4 months-worse x 3 days-denies injury to eye-states he used clear eyes per [facility] staff and feels eye is worse after drops-pain is worse with light and movement-pt NAD-steady gait

## 2019-10-01 DIAGNOSIS — H20011 Primary iridocyclitis, right eye: Secondary | ICD-10-CM | POA: Diagnosis not present

## 2019-10-02 ENCOUNTER — Other Ambulatory Visit: Payer: Self-pay

## 2019-10-02 ENCOUNTER — Other Ambulatory Visit: Payer: Medicare Other

## 2019-10-02 DIAGNOSIS — B20 Human immunodeficiency virus [HIV] disease: Secondary | ICD-10-CM

## 2019-10-03 LAB — T-HELPER CELL (CD4) - (RCID CLINIC ONLY)
CD4 % Helper T Cell: 33 % (ref 33–65)
CD4 T Cell Abs: 1063 /uL (ref 400–1790)

## 2019-10-07 LAB — RPR TITER: RPR Titer: 1:32 {titer} — ABNORMAL HIGH

## 2019-10-07 LAB — HIV-1 RNA QUANT-NO REFLEX-BLD
HIV 1 RNA Quant: <20 Copies/mL
HIV-1 RNA Quant, Log: <1.3 Log cps/mL

## 2019-10-07 LAB — FLUORESCENT TREPONEMAL AB(FTA)-IGG-BLD: Fluorescent Treponemal ABS: REACTIVE — AB

## 2019-10-07 LAB — RPR: RPR Ser Ql: REACTIVE — AB

## 2019-10-08 DIAGNOSIS — H2011 Chronic iridocyclitis, right eye: Secondary | ICD-10-CM | POA: Diagnosis not present

## 2019-10-15 DIAGNOSIS — H20011 Primary iridocyclitis, right eye: Secondary | ICD-10-CM | POA: Diagnosis not present

## 2019-10-16 ENCOUNTER — Ambulatory Visit (INDEPENDENT_AMBULATORY_CARE_PROVIDER_SITE_OTHER): Payer: Medicare Other | Admitting: Internal Medicine

## 2019-10-16 ENCOUNTER — Other Ambulatory Visit: Payer: Self-pay

## 2019-10-16 ENCOUNTER — Encounter: Payer: Self-pay | Admitting: Internal Medicine

## 2019-10-16 DIAGNOSIS — B20 Human immunodeficiency virus [HIV] disease: Secondary | ICD-10-CM | POA: Diagnosis not present

## 2019-10-16 DIAGNOSIS — H209 Unspecified iridocyclitis: Secondary | ICD-10-CM | POA: Insufficient documentation

## 2019-10-16 NOTE — Assessment & Plan Note (Signed)
His infection remains under excellent, long-term control.  He will continue Biktarvy and follow-up after lab work in 6 months.

## 2019-10-16 NOTE — Progress Notes (Signed)
Patient Active Problem List   Diagnosis Date Noted  . 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    Priority: High  . Chlamydia infection 03/29/2018    Priority: High  . Human immunodeficiency virus (HIV) disease ([place]) 03/02/2006    Priority: High  . Uveitis 10/16/2019  . Pes planus 10/02/2018  . Left ankle pain 10/02/2018  . 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 Dyspnea on exertion 10/19/2016  . Poor dentition 09/21/2016  . Pulmonary nodule 08/17/2016  . Obesity (BMI 30.0-34.9) 04/20/2016  . Dyslipidemia 07/02/2012  . GERD (gastroesophageal reflux disease) 06/28/2010  . 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 Hearing loss 03/26/2006  . ANXIETY 03/02/2006  . Depression 03/02/2006  . ALLERGIC RHINITIS 03/02/2006    Patient's Medications  New Prescriptions   No medications on file  Previous Medications   ACYCLOVIR (ZOVIRAX) 800 MG TABLET    Take 800 mg by mouth 3 (three) times daily.   BIKTARVY 50-200-25 MG TABS TABLET    TAKE 1 TABLET BY MOUTH EVERY DAY   BRIMONIDINE (ALPHAGAN) 0.15 % OPHTHALMIC SOLUTION    Place 1 drop into the right eye 3 (three) times daily.   CYCLOGYL 1 % OPHTHALMIC SOLUTION    Place 1 drop into the right eye 3 (three) times daily.   DORZOLAMIDE (TRUSOPT) 2 % OPHTHALMIC SOLUTION    Place 1 drop into the right eye 3 (three) times daily.   PREDNISOLONE ACETATE (PRED FORTE) 1 % OPHTHALMIC SUSPENSION    Place 1 drop into the right eye 4 (four) times daily.   TIMOLOL (TIMOPTIC-XR) 0.25 % OPHTHALMIC GEL-FORMING    Place 1 drop into the right eye in the morning and at bedtime.  Modified Medications   No medications on file  Discontinued Medications   No medications on file    Subjective: Matthew Fitzpatrick is in for his routine HIV follow-up visit.  He denies any problems obtaining, taking or tolerating his Biktarvy and does not recall missing any doses.  He recently developed right calf pain, redness and blurred vision.  He was seen in the  emergency department where he was noted to have increased ocular pressure in his right eye.  He was referred to Dr. Midge Aver who diagnosed uveitis.  He has been on variety of eyedrops and is feeling much better.  He saw Dr. Katy Fitch yesterday who told him his eye was looking much better.  He received a 2 dose series of Covid vaccine earlier this year and plans on getting the booster.  Review of Systems: Review of Systems  Eyes: Positive for blurred vision, pain and redness.    Past Medical History:  Diagnosis Date  . Acute conjunctivitis 02/12/2014  . Acute gastroenteritis 08/12/2015  . ANXIETY 03/02/2006   Qualifier: Diagnosis of  By: [name] MD, Macarius Ruark    . Bronchitis 01/26/2011  . CAP (community acquired pneumonia)   . CHICKENPOX, HX OF 06/20/2006   Annotation: age 87 Qualifier: Diagnosis of  By: [name] MD, Jenny Reichmann    . Chlamydia infection 03/29/2018  . COUGH, CHRONIC 12/23/2008   Qualifier: Diagnosis of  By: [name] MD, Wlliam Grosso    . Deaf   . 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 DEPRESSION 03/02/2006   Qualifier: Diagnosis of  By: [name] MD, Rufina Kimery    . Dyslipidemia 07/02/2012  . 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  FACIAL RASH 11/17/2008   Qualifier: Diagnosis of  By: [name] MD, Britten Parady    . GERD 07/20/2009   Qualifier: Diagnosis of  By: [name] MD, Tennie Grussing    . GERD (gastroesophageal reflux disease)   . HIV disease ([place])   . Obesity (BMI 30.0-34.9) 04/20/2016  . PEDICULOSIS PUBIS (PUBIC LOUSE) 06/20/2006   Annotation: 4/06 Qualifier: Diagnosis of  By: [name] MD, Dariya Gainer    . Poor dentition 09/21/2016  . 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 Pulmonary nodule   . SINUSITIS, CHRONIC 02/18/2009   Qualifier: Diagnosis of  By: Tommy Medal MD, Roderic Scarce    . 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  . Ulcerative proctosigmoiditis, with rectal  bleeding ([place]) 03/13/2018  . Urinary frequency 04/06/2009   Qualifier: Diagnosis of  By: [name] MD, Alvia Jablonski      Social History   Tobacco Use  . Smoking status: Never Smoker  . Smokeless tobacco: Never Used  Vaping Use  . Vaping Use: Never used  Substance Use Topics  . Alcohol use: No    Alcohol/week: 0.0 standard drinks  . Drug use: No    Family History  Problem Relation Age of Onset  . Headache Mother   . Hypertension Mother   . Heart attack Father 28  . Sudden Cardiac Death Neg Hx   . Colon cancer Neg Hx   . Liver disease Neg Hx   . Esophageal cancer Neg Hx   . Rectal cancer Neg Hx   . Stomach cancer Neg Hx     No Known Allergies  Health Maintenance  Topic Date Due  . COVID-19 Vaccine (1) Never done  . TETANUS/TDAP  Never done  . INFLUENZA VACCINE  09/28/2019  . Hepatitis C Screening  Completed  . HIV Screening  Completed    Objective:  Vitals:   10/16/19 0837  BP: 120/79  Pulse: 76  Temp: 98.3 F (36.8 C)  TempSrc: Oral  Weight: 242 lb (109.8 kg)   Body mass index is 36.8 kg/m.  Physical Exam Constitutional:      Comments: He is pleasant and in no distress.  He was examined with the aid of the American sign language interpreter.  Eyes:     Conjunctiva/sclera: Conjunctivae normal.  Cardiovascular:     Rate and Rhythm: Normal rate and regular rhythm.     Heart sounds: No murmur heard.   Pulmonary:     Effort: Pulmonary effort is normal.     Breath sounds: Normal breath sounds.  Psychiatric:        Mood and Affect: Mood normal.     Lab Results Lab Results  Component Value Date   WBC 5.4 03/17/2019   HGB 14.9 03/17/2019   HCT 43.3 03/17/2019   MCV 89.3 03/17/2019   PLT 256 03/17/2019    Lab Results  Component Value Date   CREATININE 1.08 03/17/2019   BUN 11 03/17/2019   NA 138 03/17/2019   K 4.1 03/17/2019   CL 103 03/17/2019   CO2 30 03/17/2019    Lab Results  Component Value Date   ALT 18 03/17/2019   AST 18 03/17/2019    ALKPHOS 134 (H) 08/13/2017   BILITOT 0.5 03/17/2019    Lab Results  Component Value Date   CHOL 218 (H) 03/02/2016   HDL 45 03/02/2016   LDLCALC 145 (H) 03/02/2016   TRIG 141 03/02/2016   CHOLHDL 4.8 03/02/2016   Lab  Results  Component Value Date   LABRPR REACTIVE (A) 10/02/2019   RPRTITER 1:32 (H) 10/02/2019   HIV 1 RNA Quant  Date Value  10/02/2019 <20 Copies/mL  03/17/2019 <20 NOT DETECTED copies/mL  08/27/2018 <20 NOT DETECTED copies/mL   CD4 T Cell Abs (/uL)  Date Value  10/02/2019 1,063  03/17/2019 1,036  08/27/2018 1,103     Problem List Items Addressed This Visit      High   Human immunodeficiency virus (HIV) disease ([place])    His infection remains under excellent, long-term control.  He will continue Biktarvy and follow-up after lab work in 6 months.      Relevant Medications   acyclovir (ZOVIRAX) 800 MG tablet   Other Relevant Orders   T-helper cell (CD4)- ([facility] clinic only)   HIV-1 RNA quant-no reflex-bld   CBC   Comprehensive metabolic panel   RPR     Unprioritized   Uveitis    I do not believe that the cause of his uveitis is now but he is improving on his current eyedrops.  His RPR remains elevated at 1:32 after retreatment for syphilis.  Since his uveitis is improving I will not start him on IV penicillin.  I suspect that his RPR is "serofast".           Michel Bickers, MD [facility] [place] [phone] pager   [phone] cell 10/16/2019, 9:02 AM

## 2019-10-16 NOTE — Assessment & Plan Note (Signed)
I do not believe that the cause of his uveitis is now but he is improving on his current eyedrops.  His RPR remains elevated at 1:32 after retreatment for syphilis.  Since his uveitis is improving I will not start him on IV penicillin.  I suspect that his RPR is "serofast".

## 2019-10-24 ENCOUNTER — Other Ambulatory Visit: Payer: Self-pay | Admitting: Internal Medicine

## 2019-10-24 ENCOUNTER — Telehealth: Payer: Self-pay | Admitting: *Deleted

## 2019-10-24 DIAGNOSIS — B20 Human immunodeficiency virus [HIV] disease: Secondary | ICD-10-CM

## 2019-10-24 NOTE — Telephone Encounter (Signed)
error 

## 2019-11-05 DIAGNOSIS — H209 Unspecified iridocyclitis: Secondary | ICD-10-CM | POA: Diagnosis not present

## 2019-11-10 DIAGNOSIS — H3581 Retinal edema: Secondary | ICD-10-CM | POA: Diagnosis not present

## 2019-11-10 DIAGNOSIS — H2513 Age-related nuclear cataract, bilateral: Secondary | ICD-10-CM | POA: Diagnosis not present

## 2019-11-10 DIAGNOSIS — H209 Unspecified iridocyclitis: Secondary | ICD-10-CM | POA: Diagnosis not present

## 2019-11-11 ENCOUNTER — Encounter: Payer: Self-pay | Admitting: Internal Medicine

## 2019-11-17 DIAGNOSIS — H209 Unspecified iridocyclitis: Secondary | ICD-10-CM | POA: Diagnosis not present

## 2019-11-19 DIAGNOSIS — H20021 Recurrent acute iridocyclitis, right eye: Secondary | ICD-10-CM | POA: Diagnosis not present

## 2019-12-02 DIAGNOSIS — H2513 Age-related nuclear cataract, bilateral: Secondary | ICD-10-CM | POA: Diagnosis not present

## 2019-12-02 DIAGNOSIS — H3581 Retinal edema: Secondary | ICD-10-CM | POA: Diagnosis not present

## 2019-12-02 DIAGNOSIS — H209 Unspecified iridocyclitis: Secondary | ICD-10-CM | POA: Diagnosis not present

## 2019-12-10 ENCOUNTER — Encounter: Payer: Self-pay | Admitting: Internal Medicine

## 2019-12-30 ENCOUNTER — Ambulatory Visit: Payer: Medicare Other | Admitting: Internal Medicine

## 2020-01-01 ENCOUNTER — Encounter: Payer: Self-pay | Admitting: Internal Medicine

## 2020-01-01 ENCOUNTER — Other Ambulatory Visit: Payer: Self-pay

## 2020-01-01 ENCOUNTER — Ambulatory Visit (INDEPENDENT_AMBULATORY_CARE_PROVIDER_SITE_OTHER): Payer: Medicare Other | Admitting: Internal Medicine

## 2020-01-01 DIAGNOSIS — H209 Unspecified iridocyclitis: Secondary | ICD-10-CM | POA: Diagnosis not present

## 2020-01-01 DIAGNOSIS — K219 Gastro-esophageal reflux disease without esophagitis: Secondary | ICD-10-CM

## 2020-01-01 DIAGNOSIS — A539 Syphilis, unspecified: Secondary | ICD-10-CM | POA: Diagnosis not present

## 2020-01-01 DIAGNOSIS — B20 Human immunodeficiency virus [HIV] disease: Secondary | ICD-10-CM

## 2020-01-01 NOTE — Assessment & Plan Note (Signed)
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  Given that his eyesight has improved dramatically without retreatment for syphilis I do not believe he needs retreatment now.

## 2020-01-01 NOTE — Assessment & Plan Note (Signed)
His HIV infection remains under excellent, long-term control.  He will continue [place] and follow-up after lab work in February.

## 2020-01-01 NOTE — Progress Notes (Signed)
[place] for Infectious Disease  Patient Active Problem List   Diagnosis Date Noted  . 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    Priority: High  . Chlamydia infection 03/29/2018    Priority: High  . Human immunodeficiency virus (HIV) disease ([place]) 03/02/2006    Priority: High  . Pes planus 10/02/2018  . Left ankle pain 10/02/2018  . 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 Dyspnea on exertion 10/19/2016  . Poor dentition 09/21/2016  . Pulmonary nodule 08/17/2016  . Obesity (BMI 30.0-34.9) 04/20/2016  . Dyslipidemia 07/02/2012  . GERD (gastroesophageal reflux disease) 06/28/2010  . 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 Hearing loss 03/26/2006  . ANXIETY 03/02/2006  . Depression 03/02/2006  . ALLERGIC RHINITIS 03/02/2006    Patient's Medications  New Prescriptions   No medications on file  Previous Medications   ACYCLOVIR (ZOVIRAX) 800 MG TABLET    Take 800 mg by mouth 3 (three) times daily.   BIKTARVY 50-200-25 MG TABS TABLET    TAKE 1 TABLET BY MOUTH EVERY DAY   BRIMONIDINE (ALPHAGAN) 0.15 % OPHTHALMIC SOLUTION    Place 1 drop into the right eye 3 (three) times daily.   CYCLOGYL 1 % OPHTHALMIC SOLUTION    Place 1 drop into the right eye 3 (three) times daily.   DORZOLAMIDE (TRUSOPT) 2 % OPHTHALMIC SOLUTION    Place 1 drop into the right eye 3 (three) times daily.   PREDNISOLONE ACETATE (PRED FORTE) 1 % OPHTHALMIC SUSPENSION    Place 1 drop into the right eye 4 (four) times daily.   TIMOLOL (TIMOPTIC) 0.5 % OPHTHALMIC SOLUTION    Place 1 drop into the right eye 2 (two) times daily.   TIMOLOL (TIMOPTIC-XR) 0.25 % OPHTHALMIC GEL-FORMING    Place 1 drop into the right eye in the morning and at bedtime.   VALTREX 1 G TABLET    Take 1,000 mg by mouth 3 (three) times daily.  Modified Medications   No medications on file  Discontinued Medications   No medications on file    Subjective: Matthew Fitzpatrick is  in for his routine HIV follow-up visit.  He has not had any problems obtaining, taking or tolerating Biktarvy and has not missed any doses.  He developed some vision loss in his left eye and was diagnosed with iridocyclitis several months ago.  He was seen by Dr. Manuella Ghazi at [facility] who noted that his RPR was elevated.  He was treated with eyedrops and also given Valacyclovir for the possibility of herpes infection.  He states that he is feeling well with the exception of some epigastric discomfort and heartburn.  Review of Systems: Review of Systems  Constitutional: Negative for fever and weight loss.  Eyes: Negative for blurred vision, double vision, pain, discharge and redness.  Respiratory: Negative for cough.   Cardiovascular: Negative for chest pain.  Gastrointestinal: Positive for abdominal pain and heartburn. Negative for diarrhea, nausea and vomiting.  Genitourinary: Negative for dysuria.  Musculoskeletal: Negative for joint pain.  Psychiatric/Behavioral: Negative for depression. The patient is not nervous/anxious.     Past Medical History:  Diagnosis Date  . Acute conjunctivitis 02/12/2014  . Acute gastroenteritis 08/12/2015  . ANXIETY 03/02/2006   Qualifier: Diagnosis of  By: [name] MD, Shamar Engelmann    . Bronchitis 01/26/2011  . CAP (community acquired pneumonia)   . CHICKENPOX, HX OF 06/20/2006   Annotation: age 22 Qualifier: Diagnosis of  By: [name] MD, Lasundra Hascall    . Chlamydia infection 03/29/2018  . COUGH, CHRONIC 12/23/2008   Qualifier: Diagnosis of  By: [name] MD, Ola Fawver    . Deaf   . 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 DEPRESSION 03/02/2006   Qualifier: Diagnosis of  By: [name] MD, Aviyon Hocevar    . Dyslipidemia 07/02/2012  . 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 FACIAL RASH 11/17/2008   Qualifier: Diagnosis of  By: [name] MD, Nalaysia Manganiello    . GERD 07/20/2009   Qualifier: Diagnosis of   By: [name] MD, Devanta Daniel    . GERD (gastroesophageal reflux disease)   . HIV disease ([place])   . Obesity (BMI 30.0-34.9) 04/20/2016  . PEDICULOSIS PUBIS (PUBIC LOUSE) 06/20/2006   Annotation: 4/06 Qualifier: Diagnosis of  By: [name] MD, Shantavia Jha    . Poor dentition 09/21/2016  . 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 Pulmonary nodule   . SINUSITIS, CHRONIC 02/18/2009   Qualifier: Diagnosis of  By: Tommy Medal MD, Roderic Scarce    . 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  . 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 Urinary frequency 04/06/2009   Qualifier: Diagnosis of  By: [name] MD, Analysa Nutting      Social History   Tobacco Use  . Smoking status: Never Smoker  . Smokeless tobacco: Never Used  Vaping Use  . Vaping Use: Never used  Substance Use Topics  . Alcohol use: No    Alcohol/week: 0.0 standard drinks  . Drug use: No    Family History  Problem Relation Age of Onset  . Headache Mother   . Hypertension Mother   . Heart attack Father 30  . Sudden Cardiac Death Neg Hx   . Colon cancer Neg Hx   . Liver disease Neg Hx   . Esophageal cancer Neg Hx   . Rectal cancer Neg Hx   . Stomach cancer Neg Hx     No Known Allergies  Objective: Vitals:   01/01/20 1023  BP: 126/81  Pulse: 82  Temp: 97.9 F (36.6 C)  TempSrc: Oral  SpO2: 97%  Weight: 247 lb (112 kg)  Height: 5' 8"  (1.727 m)   Body mass index is 37.56 kg/m.  Physical Exam Constitutional:      Comments: He is calm and pleasant.  He was interviewed with the help of the American sign language interpreter  Eyes:     Pupils: Pupils are equal, round, and reactive to light.  Cardiovascular:     Rate and Rhythm: Normal rate.     Heart sounds: No murmur heard.   Pulmonary:     Effort: Pulmonary effort is normal.     Breath sounds: Normal breath sounds.  Abdominal:     Palpations: Abdomen is soft.     Tenderness: There is no abdominal tenderness.    Psychiatric:        Mood and Affect: Mood normal.     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  Given that his eyesight has improved  dramatically without retreatment for syphilis I do not believe he needs retreatment now.      Relevant Medications   VALTREX 1 g tablet   Iridocyclitis    This has improved on eyedrops and empiric therapy for herpes.      Human immunodeficiency virus (HIV) disease ([place])    His HIV infection remains under excellent, long-term control.  He will continue [place] and follow-up after lab work in February.      Relevant Medications   VALTREX 1 g tablet     Unprioritized   GERD (gastroesophageal reflux disease)    I instructed him to try taking over-the-counter antacids.          Michel Bickers, MD [facility] for [place] [facility] [phone] pager   [phone] cell 01/01/2020, 10:58 AM

## 2020-01-01 NOTE — Assessment & Plan Note (Signed)
I instructed him to try taking over-the-counter antacids.

## 2020-01-01 NOTE — Assessment & Plan Note (Signed)
This has improved on eyedrops and empiric therapy for herpes.

## 2020-02-03 DIAGNOSIS — H2513 Age-related nuclear cataract, bilateral: Secondary | ICD-10-CM | POA: Diagnosis not present

## 2020-02-03 DIAGNOSIS — H3581 Retinal edema: Secondary | ICD-10-CM | POA: Diagnosis not present

## 2020-02-03 DIAGNOSIS — H209 Unspecified iridocyclitis: Secondary | ICD-10-CM | POA: Diagnosis not present

## 2020-04-07 ENCOUNTER — Other Ambulatory Visit: Payer: Self-pay

## 2020-04-07 ENCOUNTER — Other Ambulatory Visit: Payer: Medicare Other

## 2020-04-07 DIAGNOSIS — B20 Human immunodeficiency virus [HIV] disease: Secondary | ICD-10-CM

## 2020-04-07 MED ORDER — BIKTARVY 50-200-25 MG PO TABS: 1.0000 | ORAL_TABLET | Freq: Every day | ORAL | 0 refills | Status: DC

## 2020-04-08 LAB — T-HELPER CELL (CD4) - (RCID CLINIC ONLY)
CD4 % Helper T Cell: 32 % — ABNORMAL LOW (ref 33–65)
CD4 T Cell Abs: 1054 /uL (ref 400–1790)

## 2020-04-10 LAB — COMPREHENSIVE METABOLIC PANEL
AG Ratio: 1.2 (calc) (ref 1.0–2.5)
ALT: 19 U/L (ref 9–46)
AST: 19 U/L (ref 10–40)
Albumin: 4.1 g/dL (ref 3.6–5.1)
Alkaline phosphatase (APISO): 121 U/L (ref 36–130)
BUN: 11 mg/dL (ref 7–25)
CO2: 30 mmol/L (ref 20–32)
Calcium: 9.6 mg/dL (ref 8.6–10.3)
Chloride: 103 mmol/L (ref 98–110)
Creat: 1.04 mg/dL (ref 0.60–1.35)
Globulin: 3.5 g/dL (calc) (ref 1.9–3.7)
Glucose, Bld: 116 mg/dL — ABNORMAL HIGH (ref 65–99)
Potassium: 3.9 mmol/L (ref 3.5–5.3)
Sodium: 139 mmol/L (ref 135–146)
Total Bilirubin: 0.3 mg/dL (ref 0.2–1.2)
Total Protein: 7.6 g/dL (ref 6.1–8.1)

## 2020-04-10 LAB — CBC
HCT: 46.5 % (ref 38.5–50.0)
Hemoglobin: 16.1 g/dL (ref 13.2–17.1)
MCH: 31.1 pg (ref 27.0–33.0)
MCHC: 34.6 g/dL (ref 32.0–36.0)
MCV: 89.9 fL (ref 80.0–100.0)
MPV: 10.6 fL (ref 7.5–12.5)
Platelets: 268 10*3/uL (ref 140–400)
RBC: 5.17 10*6/uL (ref 4.20–5.80)
RDW: 12.7 % (ref 11.0–15.0)
WBC: 7 10*3/uL (ref 3.8–10.8)

## 2020-04-10 LAB — HIV-1 RNA QUANT-NO REFLEX-BLD
HIV 1 RNA Quant: 150 Copies/mL — ABNORMAL HIGH
HIV-1 RNA Quant, Log: 2.18 Log cps/mL — ABNORMAL HIGH

## 2020-04-10 LAB — FLUORESCENT TREPONEMAL AB(FTA)-IGG-BLD: Fluorescent Treponemal ABS: REACTIVE — AB

## 2020-04-10 LAB — RPR TITER: RPR Titer: 1:32 {titer} — ABNORMAL HIGH

## 2020-04-10 LAB — RPR: RPR Ser Ql: REACTIVE — AB

## 2020-04-19 ENCOUNTER — Other Ambulatory Visit: Payer: Self-pay | Admitting: Internal Medicine

## 2020-04-19 DIAGNOSIS — B20 Human immunodeficiency virus [HIV] disease: Secondary | ICD-10-CM

## 2020-04-20 DIAGNOSIS — H3581 Retinal edema: Secondary | ICD-10-CM | POA: Diagnosis not present

## 2020-04-20 DIAGNOSIS — H209 Unspecified iridocyclitis: Secondary | ICD-10-CM | POA: Diagnosis not present

## 2020-04-20 DIAGNOSIS — H2513 Age-related nuclear cataract, bilateral: Secondary | ICD-10-CM | POA: Diagnosis not present

## 2020-04-21 ENCOUNTER — Ambulatory Visit (INDEPENDENT_AMBULATORY_CARE_PROVIDER_SITE_OTHER): Payer: Medicare Other | Admitting: Internal Medicine

## 2020-04-21 ENCOUNTER — Encounter: Payer: Self-pay | Admitting: Internal Medicine

## 2020-04-21 ENCOUNTER — Other Ambulatory Visit: Payer: Self-pay

## 2020-04-21 DIAGNOSIS — B20 Human immunodeficiency virus [HIV] disease: Secondary | ICD-10-CM | POA: Diagnosis not present

## 2020-04-21 DIAGNOSIS — H209 Unspecified iridocyclitis: Secondary | ICD-10-CM

## 2020-04-21 DIAGNOSIS — A539 Syphilis, unspecified: Secondary | ICD-10-CM | POA: Diagnosis not present

## 2020-04-21 MED ORDER — BIKTARVY 50-200-25 MG PO TABS: 1.0000 | ORAL_TABLET | Freq: Every day | ORAL | 11 refills | Status: DC

## 2020-04-21 NOTE — Assessment & Plan Note (Signed)
It appears that his iridocyclitis has resolved.

## 2020-04-21 NOTE — Assessment & Plan Note (Signed)
His RPR is down since treatment for syphilis a little over a year ago but remains elevated at 1:32.  This may represent a "serofast" status but I will also need to consider retreatment if it remains elevated.

## 2020-04-21 NOTE — Assessment & Plan Note (Signed)
His infection has been under good, long-term control he will continue Biktarvy and follow-up after lab work in 6 months.

## 2020-04-21 NOTE — Progress Notes (Signed)
Patient Active Problem List   Diagnosis Date Noted  . 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    Priority: High  . Chlamydia infection 03/29/2018    Priority: High  . Human immunodeficiency virus (HIV) disease ([place]) 03/02/2006    Priority: High  . Pes planus 10/02/2018  . Left ankle pain 10/02/2018  . 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 Dyspnea on exertion 10/19/2016  . Poor dentition 09/21/2016  . Pulmonary nodule 08/17/2016  . Obesity (BMI 30.0-34.9) 04/20/2016  . Dyslipidemia 07/02/2012  . GERD (gastroesophageal reflux disease) 06/28/2010  . 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 Hearing loss 03/26/2006  . ANXIETY 03/02/2006  . Depression 03/02/2006  . ALLERGIC RHINITIS 03/02/2006    Patient's Medications  New Prescriptions   No medications on file  Previous Medications   ACYCLOVIR (ZOVIRAX) 800 MG TABLET    Take 800 mg by mouth 3 (three) times daily.   BRIMONIDINE (ALPHAGAN) 0.15 % OPHTHALMIC SOLUTION    Place 1 drop into the right eye 3 (three) times daily.   CYCLOGYL 1 % OPHTHALMIC SOLUTION    Place 1 drop into the right eye 3 (three) times daily.   DORZOLAMIDE (TRUSOPT) 2 % OPHTHALMIC SOLUTION    Place 1 drop into the right eye 3 (three) times daily.   EMTRICITABINE-TENOFOVIR (TRUVADA) 200-300 MG TABLET    Take 1 tablet by mouth daily.   PREDNISOLONE ACETATE (PRED FORTE) 1 % OPHTHALMIC SUSPENSION    Place 1 drop into the right eye 4 (four) times daily.   TIMOLOL (TIMOPTIC) 0.5 % OPHTHALMIC SOLUTION    Place 1 drop into the right eye 2 (two) times daily.   TIMOLOL (TIMOPTIC-XR) 0.25 % OPHTHALMIC GEL-FORMING    Place 1 drop into the right eye in the morning and at bedtime.   VALTREX 1 G TABLET    Take 1,000 mg by mouth 3 (three) times daily.  Modified Medications   Modified Medication Previous Medication   BICTEGRAVIR-EMTRICITABINE-TENOFOVIR AF (BIKTARVY) 50-200-25 MG TABS TABLET  bictegravir-emtricitabine-tenofovir AF (BIKTARVY) 50-200-25 MG TABS tablet      Take 1 tablet by mouth daily.    Take 1 tablet by mouth daily.  Discontinued Medications   No medications on file    Subjective: Matthew Fitzpatrick is here for his routine HIV follow-up visit.  He denies any problems obtaining, taking or tolerating his Biktarvy and does not recall missing any doses.  He is feeling well.  He received his Pfizer COVID vaccine booster in January.  He is not feeling anxious or depressed.  He continues to be followed by Dr. Manuella Ghazi following a bout of iridocyclitis.  His vision has returned to normal.  Review of Systems: Review of Systems  Constitutional: Negative for chills, diaphoresis, fever, malaise/fatigue and weight loss.  HENT: Negative for sore throat.   Eyes: Negative for blurred vision and pain.  Respiratory: Negative for cough, sputum production and shortness of breath.   Cardiovascular: Negative for chest pain.  Gastrointestinal: Negative for abdominal pain, diarrhea, heartburn, nausea and vomiting.  Genitourinary: Negative for dysuria and frequency.  Musculoskeletal: Negative for joint pain and myalgias.  Skin: Negative for rash.  Neurological: Negative for dizziness and headaches.  Psychiatric/Behavioral: Negative for depression and substance abuse. The patient is not nervous/anxious.     Past Medical History:  Diagnosis Date  . Acute conjunctivitis 02/12/2014  . Acute gastroenteritis 08/12/2015  . ANXIETY 03/02/2006   Qualifier:  Diagnosis of  By: [name] MD, Ezekial Arns    . Bronchitis 01/26/2011  . CAP (community acquired pneumonia)   . CHICKENPOX, HX OF 06/20/2006   Annotation: age 27 Qualifier: Diagnosis of  By: [name] MD, Jenny Reichmann    . Chlamydia infection 03/29/2018  . COUGH, CHRONIC 12/23/2008   Qualifier: Diagnosis of  By: [name] MD, Annalena Piatt    . Deaf   . 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 DEPRESSION 03/02/2006   Qualifier:  Diagnosis of  By: [name] MD, Reonna Finlayson    . Dyslipidemia 07/02/2012  . 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 FACIAL RASH 11/17/2008   Qualifier: Diagnosis of  By: [name] MD, Klayten Jolliff    . GERD 07/20/2009   Qualifier: Diagnosis of  By: [name] MD, Alima Naser    . GERD (gastroesophageal reflux disease)   . HIV disease ([place])   . Obesity (BMI 30.0-34.9) 04/20/2016  . PEDICULOSIS PUBIS (PUBIC LOUSE) 06/20/2006   Annotation: 4/06 Qualifier: Diagnosis of  By: [name] MD, Alisabeth Selkirk    . Poor dentition 09/21/2016  . 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 Pulmonary nodule   . SINUSITIS, CHRONIC 02/18/2009   Qualifier: Diagnosis of  By: Tommy Medal MD, Roderic Scarce    . 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  . 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 Urinary frequency 04/06/2009   Qualifier: Diagnosis of  By: [name] MD, Maika Kaczmarek      Social History   Tobacco Use  . Smoking status: Never Smoker  . Smokeless tobacco: Never Used  Vaping Use  . Vaping Use: Never used  Substance Use Topics  . Alcohol use: No    Alcohol/week: 0.0 standard drinks  . Drug use: No    Family History  Problem Relation Age of Onset  . Headache Mother   . Hypertension Mother   . Heart attack Father 70  . Sudden Cardiac Death Neg Hx   . Colon cancer Neg Hx   . Liver disease Neg Hx   . Esophageal cancer Neg Hx   . Rectal cancer Neg Hx   . Stomach cancer Neg Hx     No Known Allergies  Health Maintenance  Topic Date Due  . COVID-19 Vaccine (1) Never done  . TETANUS/TDAP  Never done  . INFLUENZA VACCINE  09/28/2019  . Hepatitis C Screening  Completed  . HIV Screening  Completed    Objective:  Vitals:   04/21/20 0949  BP: 120/82  Pulse: 84  Resp: 16  SpO2: 96%  Weight: 247 lb (112 kg)  Height: 5' 8"  (1.727 m)   Body mass index is 37.56 kg/m.  Physical Exam Constitutional:       Comments: He was examined with the aid of the sign language interpreter.  His spirits are good.  Cardiovascular:     Rate and Rhythm: Normal rate and regular rhythm.     Heart sounds: No murmur heard.   Pulmonary:     Effort: Pulmonary effort is normal.     Breath sounds: Normal breath sounds.  Abdominal:     Palpations: Abdomen is soft.     Tenderness: There is no abdominal tenderness.  Musculoskeletal:        General: No swelling or tenderness.  Skin:    Findings: No rash.  Psychiatric:  Mood and Affect: Mood normal.     Lab Results Lab Results  Component Value Date   WBC 7.0 04/07/2020   HGB 16.1 04/07/2020   HCT 46.5 04/07/2020   MCV 89.9 04/07/2020   PLT 268 04/07/2020    Lab Results  Component Value Date   CREATININE 1.04 04/07/2020   BUN 11 04/07/2020   NA 139 04/07/2020   K 3.9 04/07/2020   CL 103 04/07/2020   CO2 30 04/07/2020    Lab Results  Component Value Date   ALT 19 04/07/2020   AST 19 04/07/2020   ALKPHOS 134 (H) 08/13/2017   BILITOT 0.3 04/07/2020    Lab Results  Component Value Date   CHOL 218 (H) 03/02/2016   HDL 45 03/02/2016   LDLCALC 145 (H) 03/02/2016   TRIG 141 03/02/2016   CHOLHDL 4.8 03/02/2016   Lab Results  Component Value Date   LABRPR REACTIVE (A) 04/07/2020   RPRTITER 1:32 (H) 04/07/2020   HIV 1 RNA Quant  Date Value  04/07/2020 150 Copies/mL (H)  10/02/2019 <20 Copies/mL  03/17/2019 <20 NOT DETECTED copies/mL   CD4 T Cell Abs (/uL)  Date Value  04/07/2020 1,054  10/02/2019 1,063  03/17/2019 1,036     Problem List Items Addressed This Visit      High   Human immunodeficiency virus (HIV) disease ([place])    His infection has been under good, long-term control he will continue Biktarvy and follow-up after lab work in 6 months.      Relevant Medications   emtricitabine-tenofovir (TRUVADA) 200-300 MG tablet   bictegravir-emtricitabine-tenofovir AF (BIKTARVY) 50-200-25 MG TABS tablet   Other Relevant Orders    T-helper cell (CD4)- ([facility] clinic only)   HIV-1 RNA quant-no reflex-bld   Syphilis    His RPR is down since treatment for syphilis a little over a year ago but remains elevated at 1:32.  This may represent a "serofast" status but I will also need to consider retreatment if it remains elevated.      Relevant Medications   emtricitabine-tenofovir (TRUVADA) 200-300 MG tablet   bictegravir-emtricitabine-tenofovir AF (BIKTARVY) 50-200-25 MG TABS tablet   Iridocyclitis    It appears that his iridocyclitis has resolved.           Michel Bickers, MD [facility] [place] [facility] [phone] pager   [phone] cell 04/21/2020, 10:04 AM

## 2020-05-27 ENCOUNTER — Other Ambulatory Visit: Payer: Self-pay | Admitting: Internal Medicine

## 2020-05-27 ENCOUNTER — Telehealth: Payer: Self-pay

## 2020-05-27 DIAGNOSIS — B20 Human immunodeficiency virus [HIV] disease: Secondary | ICD-10-CM

## 2020-05-27 MED ORDER — BIKTARVY 50-200-25 MG PO TABS: 1.0000 | ORAL_TABLET | Freq: Every day | ORAL | 9 refills | Status: DC

## 2020-05-27 NOTE — Telephone Encounter (Signed)
Received call from patient via sign language interpreter (ID # [phone]). Patient is having difficulty filling Biktarvy at [facility], he requests to have prescription sent to [facility] in [place]. Per financial, patient's SPAP is active. RN will send Biktarvy to [facility].   Beryle Flock, RN

## 2020-05-27 NOTE — Telephone Encounter (Signed)
RN called [facility] to cancel [place] prescription. RN spoke to [facility] and verified that [facility]or will be sent out with no cost associated. Attempted to call patient to let him know, no answer. RN left HIPAA compliant voicemail via sign language interpreter stating that patient's medication will be shipped soon, no specifics were discussed.   Beryle Flock, RN

## 2020-06-15 DIAGNOSIS — H209 Unspecified iridocyclitis: Secondary | ICD-10-CM | POA: Diagnosis not present

## 2020-06-15 DIAGNOSIS — H2513 Age-related nuclear cataract, bilateral: Secondary | ICD-10-CM | POA: Diagnosis not present

## 2020-06-15 DIAGNOSIS — H3581 Retinal edema: Secondary | ICD-10-CM | POA: Diagnosis not present

## 2020-07-27 DIAGNOSIS — H3581 Retinal edema: Secondary | ICD-10-CM | POA: Diagnosis not present

## 2020-07-27 DIAGNOSIS — H209 Unspecified iridocyclitis: Secondary | ICD-10-CM | POA: Diagnosis not present

## 2020-07-27 DIAGNOSIS — Z79899 Other long term (current) drug therapy: Secondary | ICD-10-CM | POA: Diagnosis not present

## 2020-07-27 DIAGNOSIS — H2513 Age-related nuclear cataract, bilateral: Secondary | ICD-10-CM | POA: Diagnosis not present

## 2020-08-17 ENCOUNTER — Other Ambulatory Visit: Payer: Self-pay

## 2020-08-17 ENCOUNTER — Ambulatory Visit (INDEPENDENT_AMBULATORY_CARE_PROVIDER_SITE_OTHER): Payer: Medicare Other | Admitting: Family

## 2020-08-17 VITALS — BP 141/80 | HR 84 | Temp 98.4°F | Resp 16 | Wt 252.0 lb

## 2020-08-17 DIAGNOSIS — R002 Palpitations: Secondary | ICD-10-CM | POA: Diagnosis not present

## 2020-08-17 DIAGNOSIS — G4700 Insomnia, unspecified: Secondary | ICD-10-CM

## 2020-08-17 LAB — CBC WITH DIFFERENTIAL/PLATELET
Basophils Absolute: 0 10*3/uL (ref 0.0–0.1)
Basophils Relative: 0.5 % (ref 0.0–3.0)
Eosinophils Absolute: 0.1 10*3/uL (ref 0.0–0.7)
Eosinophils Relative: 1 % (ref 0.0–5.0)
HCT: 43.1 % (ref 39.0–52.0)
Hemoglobin: 14.7 g/dL (ref 13.0–17.0)
Lymphocytes Relative: 51.8 % — ABNORMAL HIGH (ref 12.0–46.0)
Lymphs Abs: 3.4 10*3/uL (ref 0.7–4.0)
MCHC: 34 g/dL (ref 30.0–36.0)
MCV: 93.4 fl (ref 78.0–100.0)
Monocytes Absolute: 0.6 10*3/uL (ref 0.1–1.0)
Monocytes Relative: 9.9 % (ref 3.0–12.0)
Neutro Abs: 2.4 10*3/uL (ref 1.4–7.7)
Neutrophils Relative %: 36.8 % — ABNORMAL LOW (ref 43.0–77.0)
Platelets: 283 10*3/uL (ref 150.0–400.0)
RBC: 4.62 Mil/uL (ref 4.22–5.81)
RDW: 14 % (ref 11.5–15.5)
WBC: 6.5 10*3/uL (ref 4.0–10.5)

## 2020-08-17 LAB — COMPREHENSIVE METABOLIC PANEL
ALT: 25 U/L (ref 0–53)
AST: 21 U/L (ref 0–37)
Albumin: 4.2 g/dL (ref 3.5–5.2)
Alkaline Phosphatase: 112 U/L (ref 39–117)
BUN: 11 mg/dL (ref 6–23)
CO2: 27 mEq/L (ref 19–32)
Calcium: 9.3 mg/dL (ref 8.4–10.5)
Chloride: 99 mEq/L (ref 96–112)
Creatinine, Ser: 0.99 mg/dL (ref 0.40–1.50)
GFR: 95.09 mL/min (ref >60.00)
Glucose, Bld: 249 mg/dL — ABNORMAL HIGH (ref 70–99)
Potassium: 4.1 mEq/L (ref 3.5–5.1)
Sodium: 134 mEq/L — ABNORMAL LOW (ref 135–145)
Total Bilirubin: 0.3 mg/dL (ref 0.2–1.2)
Total Protein: 7.5 g/dL (ref 6.0–8.3)

## 2020-08-17 LAB — TSH: TSH: 1.1 u[IU]/mL (ref 0.35–4.50)

## 2020-08-17 MED ORDER — TRAZODONE HCL 50 MG PO TABS: ORAL_TABLET | ORAL | 2 refills | Status: DC

## 2020-08-17 NOTE — Progress Notes (Signed)
Established Patient Office Visit  Subjective:  Patient ID: Matthew Fitzpatrick, male    DOB: 01/30/1981  Age: 40 y.o. MRN: [id number]  CC:  Chief Complaint  Patient presents with   Insomnia    Patient complains of trouble staying sleep for about 2 months    HPI Matthew Fitzpatrick presents today to discuss insomnia. He is accompanied by a sign language interpretor. Notes that he has trouble staying asleep for the last 2 months. He reports that he has tried multiple OTC meds without improvement. Reports that he had a episode of elevated heart rate about 2 weeks ago.  Occurred at work for about 1 hour.  He rested and had some water and then felt better.  He had an RN listen to his heart rate and told him "It was really fast."    He does not have increased stress.  He does not drink any caffeine.  He tries to go to sleep between 9 PM and 10PM. Limited screen use before bedtime.  He uses otc Sleep 3 supplement and melatonin without improvement in his symptoms.  Wt Readings from Last 3 Encounters:  08/17/20 252 lb (114.3 kg)  04/21/20 247 lb (112 kg)  01/01/20 247 lb (112 kg)     Past Medical History:  Diagnosis Date   Acute conjunctivitis 02/12/2014   Acute gastroenteritis 08/12/2015   ANXIETY 03/02/2006   Qualifier: Diagnosis of  By: [name] MD, [name]     Bronchitis 01/26/2011   CAP (community acquired pneumonia)    CHICKENPOX, HX OF 06/20/2006   Annotation: age 83 Qualifier: Diagnosis of  By: [name] MD, [name]     Chlamydia infection 03/29/2018   COUGH, CHRONIC 12/23/2008   Qualifier: Diagnosis of  By: [name] MD, [name]     Deaf    DEAFNESS, 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   Qualifier: Diagnosis of  By: [name] MD, [name]     DEPRESSION 03/02/2006   Qualifier: Diagnosis of  By: [name] MD, [name]     Dyslipidemia 07/02/2012   ERECTILE DYSFUNCTION, 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   Qualifier: Diagnosis of  By: [name] MD, [name]     FACIAL RASH 11/17/2008   Qualifier: Diagnosis of  By: [name] MD, [name]      GERD 07/20/2009   Qualifier: Diagnosis of  By: [name] MD, [name]     GERD (gastroesophageal reflux disease)    HIV disease ([place])    Obesity (BMI 30.0-34.9) 04/20/2016   PEDICULOSIS PUBIS (PUBIC LOUSE) 06/20/2006   Annotation: 4/06 Qualifier: Diagnosis of  By: [name] MD, [name]     Poor dentition 09/21/2016   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, 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 Qualifier: Diagnosis of  By: [name] MD, [name]     Pulmonary nodule    SINUSITIS, CHRONIC 02/18/2009   Qualifier: Diagnosis of  By: Tommy Medal MD, [name]     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   Ulcerative proctosigmoiditis, 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   Urinary frequency 04/06/2009   Qualifier: Diagnosis of  By: [name] MD, [name]      Past Surgical History:  Procedure Laterality Date   colonoscopy     THROAT SURGERY     UMBILICAL HERNIA REPAIR      Family History  Problem Relation Age of Onset   Headache Mother    Hypertension Mother    Heart attack Father 47   Sudden Cardiac Death Neg Hx    Colon cancer Neg Hx    Liver disease Neg Hx    Esophageal cancer Neg  Hx    Rectal cancer Neg Hx    Stomach cancer Neg Hx     Social History   Socioeconomic History   Marital status: Single    Spouse name: Not on file   Number of children: 0   Years of education: Not on file   Highest education level: Not on file  Occupational History   Not on file  Tobacco Use   Smoking status: Never   Smokeless tobacco: Never  Vaping Use   Vaping Use: Never used  Substance and Sexual Activity   Alcohol use: No    Alcohol/week: 0.0 standard drinks   Drug use: No   Sexual activity: Not Currently    Partners: Male  Other Topics Concern   Not on file  Social History Narrative   Lives alone   Mom lives with his sister in [place]   No children   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 30 tablet 9   acyclovir (ZOVIRAX) 800 MG tablet Take 800 mg by mouth 3 (three) times daily.     brimonidine (ALPHAGAN) 0.15 % ophthalmic solution Place 1 drop into the right eye 3 (three) times daily. 10 mL 0   CYCLOGYL 1 % ophthalmic solution Place 1 drop into the right eye 3 (three) times daily.     dorzolamide (TRUSOPT) 2 % ophthalmic solution Place 1 drop into the right eye 3 (three) times daily.     emtricitabine-tenofovir (TRUVADA) 200-300 MG tablet Take 1 tablet by mouth daily.     prednisoLONE acetate (PRED FORTE) 1 % ophthalmic suspension Place 1 drop into the right eye 4 (four) times daily.     timolol (TIMOPTIC) 0.5 % ophthalmic solution Place 1 drop into the right eye 2 (two) times daily.     timolol (TIMOPTIC-XR) 0.25 % ophthalmic gel-forming Place 1 drop into the right eye in the morning and at bedtime. 10 mL 0   VALTREX 1 g tablet Take 1,000 mg by mouth 3 (three) times daily.     No facility-administered medications prior to visit.    No Known Allergies  ROS Review of Systems   See HPI Objective:    Physical Exam Constitutional:      General: He is not in acute distress.    Appearance: He is well-developed.  HENT:     Head: Normocephalic and atraumatic.  Cardiovascular:     Rate and Rhythm: Normal rate and regular rhythm.     Heart sounds: No murmur heard. Pulmonary:     Effort: Pulmonary effort is normal. No respiratory distress.     Breath sounds: Normal breath sounds. No wheezing or rales.  Skin:    General: Skin is warm and dry.  Neurological:     Mental Status: He is alert and oriented  to person, place, and time.  Psychiatric:        Behavior: Behavior normal.        Thought Content: Thought content normal.  BP (!) 141/80 (BP Location: Right Arm, Patient Position: Sitting, Cuff Size: Large)   Pulse 84   Temp 98.4 F (36.9 C) (Oral)   Resp 16   Wt 252 lb (114.3 kg)   SpO2 99%   BMI 38.32 kg/m  Wt Readings from Last 3 Encounters:  08/17/20 252 lb (114.3 kg)  04/21/20 247 lb (112 kg)  01/01/20 247 lb (112 kg)     Health Maintenance Due  Topic Date Due   Pneumococcal Vaccine 54-37 Years old (1 - PCV) Never done   TETANUS/TDAP  Never done   COVID-19 Vaccine (4 - Booster for Pfizer series) 06/22/2020    There are no preventive care reminders to display for this patient.  Lab Results  Component Value Date   TSH 1.570 10/19/2016   Lab Results  Component Value Date   WBC 7.0 04/07/2020   HGB 16.1 04/07/2020   HCT 46.5 04/07/2020   MCV 89.9 04/07/2020   PLT 268 04/07/2020   Lab Results  Component Value Date   NA 139 04/07/2020   K 3.9 04/07/2020   CO2 30 04/07/2020   GLUCOSE 116 (H) 04/07/2020   BUN 11 04/07/2020   CREATININE 1.04 04/07/2020   BILITOT 0.3 04/07/2020   ALKPHOS 134 (H) 08/13/2017   AST 19 04/07/2020   ALT 19 04/07/2020   PROT 7.6 04/07/2020   ALBUMIN 3.9 08/13/2017   CALCIUM 9.6 04/07/2020   ANIONGAP 8 06/09/2017   GFR 112.86 08/13/2017   Lab Results  Component Value Date   CHOL 218 (H) 03/02/2016   Lab Results  Component Value Date   HDL 45 03/02/2016   Lab Results  Component Value Date   LDLCALC 145 (H) 03/02/2016   Lab Results  Component Value Date   TRIG 141 03/02/2016   Lab Results  Component Value Date   CHOLHDL 4.8 03/02/2016   No results found for: HGBA1C    Assessment & Plan:   Problem List Items Addressed This Visit       Unprioritized   Palpitations - Primary    Check labs as below. Refer to cardiology for further evaluation. Would likely benefit from zio monitor.         Relevant Orders    Comp Met (CMET)   TSH   CBC w/Diff   Ambulatory referral to Cardiology   Insomnia    Uncontrolled. Will give trial of trazodone 55m 1/2-1 tab PO HS prn sleep.  It sounds like he has good sleep hygiene habits overall.         Meds ordered this encounter  Medications   traZODone (DESYREL) 50 MG tablet    Sig: 1/2- 1 tablet by mouth at bedtime as needed for sleep    Dispense:  30 tablet    Refill:  2    Order Specific Question:   Supervising Provider    Answer:   BPenni HomansA [[id number]]    Follow-up: Return in about 4 weeks (around 09/14/2020) for annual physical.    MNance Pear NP

## 2020-08-17 NOTE — Assessment & Plan Note (Addendum)
Check labs as below. Refer to cardiology for further evaluation. Would likely benefit from zio monitor.    EKG tracing is personally reviewed.  EKG notes sinus rythm with incomplete RBBB and LAFB.  No acute changes.

## 2020-08-17 NOTE — Patient Instructions (Addendum)
Please begin trazodone 45m 1/2-1 tab once daily at bedtime as needed for sleep.  Go to the ER if you develop another episode of rapid heart rate so that they can do an EKG to look for abnormal heart rhythm.

## 2020-08-17 NOTE — Assessment & Plan Note (Signed)
Uncontrolled. Will give trial of trazodone 35m 1/2-1 tab PO HS prn sleep.  It sounds like he has good sleep hygiene habits overall.

## 2020-08-18 ENCOUNTER — Other Ambulatory Visit (INDEPENDENT_AMBULATORY_CARE_PROVIDER_SITE_OTHER): Payer: Medicare Other

## 2020-08-18 ENCOUNTER — Telehealth: Payer: Self-pay | Admitting: Family

## 2020-08-18 ENCOUNTER — Encounter: Payer: Self-pay | Admitting: Family

## 2020-08-18 DIAGNOSIS — E119 Type 2 diabetes mellitus without complications: Secondary | ICD-10-CM | POA: Insufficient documentation

## 2020-08-18 DIAGNOSIS — R739 Hyperglycemia, unspecified: Secondary | ICD-10-CM

## 2020-08-18 DIAGNOSIS — E1165 Type 2 diabetes mellitus with hyperglycemia: Secondary | ICD-10-CM | POA: Insufficient documentation

## 2020-08-18 HISTORY — DX: Hyperglycemia, unspecified: R73.9

## 2020-08-18 NOTE — Telephone Encounter (Signed)
See mychart.  

## 2020-08-19 ENCOUNTER — Encounter: Payer: Self-pay | Admitting: Family

## 2020-08-19 LAB — HEMOGLOBIN A1C: Hgb A1c MFr Bld: 6.7 % — ABNORMAL HIGH (ref 4.6–6.5)

## 2020-08-19 NOTE — Telephone Encounter (Signed)
Please contact pt re: unread mychart message if he has not read it by the end of the day today.

## 2020-08-20 NOTE — Telephone Encounter (Signed)
Left detail message through language interpreter for patient to be aware of mychart message and review or call back to the office for results and instructions.

## 2020-08-23 NOTE — Telephone Encounter (Signed)
See mychart.  

## 2020-08-24 NOTE — Telephone Encounter (Signed)
Left another detail message through language interpreter, patient to let us know if he reviewed the mychart message or to call for results and provider's instructions.

## 2020-08-24 NOTE — Telephone Encounter (Signed)
Letter from provider mailed out to patient with this information

## 2020-09-15 ENCOUNTER — Ambulatory Visit (INDEPENDENT_AMBULATORY_CARE_PROVIDER_SITE_OTHER): Payer: Medicare Other | Admitting: Family

## 2020-09-15 ENCOUNTER — Other Ambulatory Visit: Payer: Self-pay

## 2020-09-15 ENCOUNTER — Telehealth: Payer: Self-pay | Admitting: Family

## 2020-09-15 DIAGNOSIS — G4700 Insomnia, unspecified: Secondary | ICD-10-CM

## 2020-09-15 DIAGNOSIS — R002 Palpitations: Secondary | ICD-10-CM

## 2020-09-15 DIAGNOSIS — R7612 Nonspecific reaction to cell mediated immunity measurement of gamma interferon antigen response without active tuberculosis: Secondary | ICD-10-CM | POA: Diagnosis not present

## 2020-09-15 DIAGNOSIS — B20 Human immunodeficiency virus [HIV] disease: Secondary | ICD-10-CM

## 2020-09-15 DIAGNOSIS — H209 Unspecified iridocyclitis: Secondary | ICD-10-CM

## 2020-09-15 MED ORDER — TETANUS-DIPHTHERIA TOXOIDS TD 2-2 LF/0.5ML IM SUSP: 0.5000 mL | Freq: Once | INTRAMUSCULAR | 0 refills | Status: AC

## 2020-09-15 MED ORDER — TRAZODONE HCL 50 MG PO TABS: ORAL_TABLET | ORAL | 2 refills | Status: DC

## 2020-09-15 NOTE — Progress Notes (Signed)
Subjective:   By signing my name below, I, Matthew Fitzpatrick, attest that this documentation has been prepared under the direction and in the presence of [name] NP. 09/15/2020    Patient ID: Matthew Fitzpatrick, male    DOB: 1980-04-20, 40 y.o.   MRN: [id number]  Chief Complaint  Patient presents with   Follow-up    improved    HPI Patient is in today for a physical exam. He is hearing impaired and he has an interpreter with him for this visit. He denies having any unexpected weight change, hearing loss and rhinorrhea, visual disturbance, cough, chest pain and leg swelling, nausea, vomiting, diarrhea and blood in stool, or dysuria and frequency, for myalgias and arthralgias, rash, headaches, adenopathy, depression or anxiety at this time.  Sleep: He is taking 50 mg trazodone PO prn. He reports doing well on it but has not taken it frequently because he is trying to save some it. He is also requesting a refill on   Palpitations: His palpitations have not changed since last visit. He reports them being intermittent and states that he has an upcoming appointment with a cardiologist on October 25, 2020.   Immunizations: He is due for the tetanus vaccines. He is due for the pneumonia booster vaccine but we are currently out of the vaccine at the clinic today. He has 3 Covid-19 vaccines at this time and is willing to get the 2nd booster vaccine. Colonoscopy: Last completed on 01/28/2018. Results showed preparation of the colon was poor. There were signs of mucosal ulceration which was biopsied. Otherwise results were normal. Vision:  He is UTD on vision care. He only received 1 out of his 3 eye drops medication from his pharmacy and will contact his vision provider about this issue.   Health Maintenance Due  Topic Date Due   FOOT EXAM  Never done   OPHTHALMOLOGY EXAM  Never done   URINE MICROALBUMIN  Never done   TETANUS/TDAP  Never done   Pneumococcal Vaccine 108-56 Years old (3 -  PCV) 11/03/2010   COVID-19 Vaccine (4 - Booster for Pfizer series) 06/22/2020    Past Medical History:  Diagnosis Date   Acute conjunctivitis 02/12/2014   Acute gastroenteritis 08/12/2015   ANXIETY 03/02/2006   Qualifier: Diagnosis of  By: [name] MD, [name]     Bronchitis 01/26/2011   CAP (community acquired pneumonia)    CHICKENPOX, HX OF 06/20/2006   Annotation: age 41 Qualifier: Diagnosis of  By: [name] MD, [name]     Chlamydia infection 03/29/2018   COUGH, CHRONIC 12/23/2008   Qualifier: Diagnosis of  By: [name] MD, [name]     Deaf    DEAFNESS, 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   Qualifier: Diagnosis of  By: [name] MD, [name]     DEPRESSION 03/02/2006   Qualifier: Diagnosis of  By: [name] MD, [name]     diabetes type 2 08/18/2020   Dyslipidemia 07/02/2012   ERECTILE DYSFUNCTION, 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   Qualifier: Diagnosis of  By: [name] MD, [name]     FACIAL RASH 11/17/2008   Qualifier: Diagnosis of  By: [name] MD, [name]     GERD 07/20/2009   Qualifier: Diagnosis of  By: [name] MD, [name]     GERD (gastroesophageal reflux disease)    HIV disease ([place])    Obesity (BMI 30.0-34.9) 04/20/2016   PEDICULOSIS PUBIS (PUBIC LOUSE) 06/20/2006   Annotation: 4/06 Qualifier: Diagnosis of  By: [name] MD, [name]     Poor dentition 09/21/2016   PROCTITIS  03/26/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, 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 Qualifier: Diagnosis of  By: [name] MD, [name]     Pulmonary nodule    SINUSITIS, CHRONIC 02/18/2009   Qualifier: Diagnosis of  By: Tommy Medal MD, [name]     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   Ulcerative proctosigmoiditis, 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   Urinary frequency 04/06/2009   Qualifier: Diagnosis of  By: [name] MD, [name]      Past Surgical History:  Procedure Laterality Date   colonoscopy     THROAT SURGERY     UMBILICAL HERNIA REPAIR      Family History  Problem Relation Age of Onset   Headache Mother    Hypertension Mother    Heart attack Father 65   Sudden Cardiac Death Neg Hx     Colon cancer Neg Hx    Liver disease Neg Hx    Esophageal cancer Neg Hx    Rectal cancer Neg Hx    Stomach cancer Neg Hx     Social History   Socioeconomic History   Marital status: Single    Spouse name: Not on file   Number of children: 0   Years of education: Not on file   Highest education level: Not on file  Occupational History   Not on file  Tobacco Use   Smoking status: Never   Smokeless tobacco: Never  Vaping Use   Vaping Use: Never used  Substance and Sexual Activity   Alcohol use: No    Alcohol/week: 0.0 standard drinks   Drug use: No   Sexual activity: Not Currently    Partners: Male  Other Topics Concern   Not on file  Social History Narrative   Lives alone   Mom lives with his sister in [place]   No children   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 30 tablet 9   folic acid (FOLVITE) 1 MG tablet Take 1 tablet by mouth daily.     methotrexate (RHEUMATREX) 2.5 MG tablet Take by mouth.     traZODone (DESYREL) 50 MG tablet 1/2- 1 tablet by mouth at bedtime as needed for sleep 30 tablet 2   azaTHIOprine (IMURAN) 50 MG tablet Take by mouth.     No facility-administered medications prior to visit.    No Known Allergies  Review of Systems  Constitutional:  Negative for fever.       (-)unexpected weight changes (-)adenopathy  HENT:  Negative for hearing loss.        (-)rhinorrhea  Eyes:        (-)vision changes  Respiratory:  Negative  for cough.   Cardiovascular:  Negative for chest pain and leg swelling.  Gastrointestinal:  Negative for blood in stool, diarrhea, nausea and vomiting.  Genitourinary:  Negative for dysuria and frequency.  Musculoskeletal:  Negative for joint pain and myalgias.  Skin:  Negative for rash.  Neurological:  Negative for headaches.  Psychiatric/Behavioral:  Negative for depression. The patient is  not nervous/anxious.       Objective:    Physical Exam Constitutional:      General: He is not in acute distress.    Appearance: Normal appearance. He is not ill-appearing.  HENT:     Head: Normocephalic and atraumatic.     Right Ear: Tympanic membrane, ear canal and external ear normal.     Left Ear: Tympanic membrane, ear canal and external ear normal.  Eyes:     Extraocular Movements: Extraocular movements intact.     Pupils: Pupils are equal, round, and reactive to light.     Comments: (-)nystagmus  Cardiovascular:     Rate and Rhythm: Normal rate and regular rhythm.     Pulses: Normal pulses.     Heart sounds: No murmur heard.   No gallop.  Pulmonary:     Effort: Pulmonary effort is normal. No respiratory distress.     Breath sounds: Normal breath sounds. No wheezing, rhonchi or rales.  Abdominal:     General: Bowel sounds are normal. There is no distension.     Palpations: Abdomen is soft.     Tenderness: There is no abdominal tenderness. There is no guarding or rebound.  Musculoskeletal:     Comments: 5/5 strength in upper and lower extremities  Lymphadenopathy:     Cervical: No cervical adenopathy.  Skin:    General: Skin is warm and dry.  Neurological:     Mental Status: He is alert and oriented to person, place, and time.     Deep Tendon Reflexes:     Reflex Scores:      Patellar reflexes are 2+ on the right side and 2+ on the left side. Psychiatric:        Behavior: Behavior normal.        Judgment: Judgment normal.    BP 135/80   Pulse 80   Resp 18   Ht 5' 8"   (1.727 m)   Wt 246 lb (111.6 kg)   SpO2 98%   BMI 37.40 kg/m  Wt Readings from Last 3 Encounters:  09/15/20 246 lb (111.6 kg)  08/17/20 252 lb (114.3 kg)  04/21/20 247 lb (112 kg)       Assessment & Plan:   Problem List Items Addressed This Visit       Unprioritized   Palpitations    Unchanged.  Encouraged pt to keep his upcoming appointment with cardiology.        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 Katy Fitch and Dr. Dwana Melena.  Rx plan per opthalomology:  Acyclovir 1 tablet (800 mg total) by mouth 2 times daily  - Methotrexate 8 tablets (4 in the morning, 4 in the evening) (20 mg total) by mouth once a week  - Folic acid 1 tablet (1 mg total) by mouth daily  - Trusopt (orange top drop) 3x day in both eyes  - Timolol (yellow top drop) daily in both eyes  - Brimonidine (purple top drop) 3x day in both eyes         Insomnia    Stable on trazodone 53m PO nightly prn. Continue same.        Human immunodeficiency virus (HIV) disease ([place]    Stable, management per ID. Recommended that he get his second covid booster.        Relevant Medications   azaTHIOprine (IMURAN) 50 MG tablet     Meds ordered this encounter  Medications   traZODone (DESYREL) 50 MG  tablet    Sig: 1/2- 1 tablet by mouth at bedtime as needed for sleep    Dispense:  30 tablet    Refill:  2    Order Specific Question:   Supervising Provider    Answer:   Penni Homans A [[id number]]   diptheria-tetanus toxoids (DECAVAC) 2-2 LF/0.5ML injection    Sig: Inject 0.5 mLs into the muscle once for 1 dose.    Dispense:  0.5 mL    Refill:  0    Order Specific Question:   Supervising Provider    Answer:   Penni Homans A [[id number]]    I, [name] NP., personally preformed the services described in this documentation.  All medical record entries made by the scribe were at my direction and in my presence.  I have reviewed the chart and discharge instructions (if applicable) and agree  that the record reflects my personal performance and is accurate and complete. 09/15/2020   I,Matthew Fitzpatrick,acting as a Education administrator for Nance Pear, NP.,have documented all relevant documentation on the behalf of Nance Pear, NP,as directed by  Nance Pear, NP while in the presence of Nance Pear, NP.   Nance Pear, NP

## 2020-09-15 NOTE — Telephone Encounter (Signed)
Opened in error

## 2020-09-15 NOTE — Patient Instructions (Signed)
Please get a second Covid booster (these are given downstairs in our pharmacy- you can just walk in during business hours). You are due for a tetanus shot and I have sent a prescription to your pharmacy for you to receive from them (this will be less expensive for you this way).

## 2020-09-16 ENCOUNTER — Telehealth: Payer: Self-pay | Admitting: Family

## 2020-09-16 DIAGNOSIS — R7612 Nonspecific reaction to cell mediated immunity measurement of gamma interferon antigen response without active tuberculosis: Secondary | ICD-10-CM | POA: Insufficient documentation

## 2020-09-16 NOTE — Telephone Encounter (Signed)
-----   Message from Michel Bickers, MD sent at 09/16/2020  1:29 PM EDT ----- Regarding: RE: Garnet Over[place],  I was not aware. Thanks for letting me know. I will follow up on it.  [name] ----- Message ----- From: [name], NP Sent: 09/16/2020   1:25 PM EDT To: Michel Bickers, MD  Hi Dr. [name],  I saw in his [facility] record that he had a positive TB gold last fall.  Not sure if this information had been communicated to you.  Thanks,  [employer]

## 2020-09-16 NOTE — Assessment & Plan Note (Signed)
I noted this in his [facility] records.  Will make sure ID is aware.

## 2020-09-16 NOTE — Assessment & Plan Note (Signed)
Unchanged.  Encouraged pt to keep his upcoming appointment with cardiology.

## 2020-09-16 NOTE — Assessment & Plan Note (Signed)
He is currently followed by Dr. Katy Fitch and Dr. Dwana Melena.  Rx plan per opthalomology:  Acyclovir 1 tablet (800 mg total) by mouth 2 times daily  - Methotrexate 8 tablets (4 in the morning, 4 in the evening) (20 mg total) by mouth once a week  - Folic acid 1 tablet (1 mg total) by mouth daily  - Trusopt (orange top drop) 3x day in both eyes  - Timolol (yellow top drop) daily in both eyes  - Brimonidine (purple top drop) 3x day in both eyes

## 2020-09-16 NOTE — Assessment & Plan Note (Signed)
Stable, management per ID. Recommended that he get his second covid booster.

## 2020-09-16 NOTE — Assessment & Plan Note (Signed)
Stable on trazodone 66m PO nightly prn. Continue same.

## 2020-09-21 ENCOUNTER — Encounter: Payer: Self-pay | Admitting: Internal Medicine

## 2020-09-21 DIAGNOSIS — Z227 Latent tuberculosis: Secondary | ICD-10-CM | POA: Insufficient documentation

## 2020-10-05 ENCOUNTER — Other Ambulatory Visit: Payer: Medicare Other

## 2020-10-05 ENCOUNTER — Other Ambulatory Visit: Payer: Self-pay

## 2020-10-05 ENCOUNTER — Telehealth: Payer: Self-pay

## 2020-10-05 DIAGNOSIS — B20 Human immunodeficiency virus [HIV] disease: Secondary | ICD-10-CM | POA: Diagnosis not present

## 2020-10-05 NOTE — Telephone Encounter (Signed)
Patient here for labs and asks to speak with a nurse regarding a rash. He states the rash is itchy and has been present on his arms for about 1 month. At first he thought it was poison oak.   He denies any recent anonymous sex or sex with multiple partners. He says he had another doctor look at the rash and they told him it was not Monkeypox. He does not think they actually tested him for it however. He expresses concern that it may actually be monkeypox and wishes to be tested.   He accepts an appointment to come in for testing.  Beryle Flock, RN

## 2020-10-06 ENCOUNTER — Encounter: Payer: Self-pay | Admitting: Physician Assistant

## 2020-10-06 ENCOUNTER — Other Ambulatory Visit (HOSPITAL_COMMUNITY)
Admission: RE | Admit: 2020-10-06 | Discharge: 2020-10-06 | Disposition: A | Discharge status: Home / Self Care | Payer: Medicare Other | Source: Ambulatory Visit | Attending: Physician Assistant | Admitting: Physician Assistant

## 2020-10-06 ENCOUNTER — Ambulatory Visit (INDEPENDENT_AMBULATORY_CARE_PROVIDER_SITE_OTHER): Payer: Medicare Other | Admitting: Physician Assistant

## 2020-10-06 ENCOUNTER — Other Ambulatory Visit: Payer: Self-pay

## 2020-10-06 VITALS — BP 124/83 | HR 67 | Temp 97.9°F | Resp 16

## 2020-10-06 DIAGNOSIS — L237 Allergic contact dermatitis due to plants, except food: Secondary | ICD-10-CM | POA: Diagnosis not present

## 2020-10-06 DIAGNOSIS — Z113 Encounter for screening for infections with a predominantly sexual mode of transmission: Secondary | ICD-10-CM

## 2020-10-06 LAB — T-HELPER CELL (CD4) - (RCID CLINIC ONLY)
CD4 % Helper T Cell: 34 % (ref 33–65)
CD4 T Cell Abs: 1214 /uL (ref 400–1790)

## 2020-10-06 MED ORDER — HYDROXYZINE HCL 25 MG PO TABS: 25.0000 mg | ORAL_TABLET | Freq: Three times a day (TID) | ORAL | 0 refills | Status: DC | PRN

## 2020-10-06 MED ORDER — CLOBETASOL PROPIONATE 0.05 % EX OINT: 1.0000 "application " | TOPICAL_OINTMENT | Freq: Two times a day (BID) | CUTANEOUS | 0 refills | Status: DC

## 2020-10-06 NOTE — Progress Notes (Signed)
Subjective:    Patient ID: Matthew Fitzpatrick, male    DOB: 1980/10/23, 40 y.o.   MRN: [id number]  Chief Complaint  Patient presents with   Rash    X 1 month following yard work    Antony Haste [phone] video interpreter ASL used throughout entirety of visit  HPI:  Matthew Fitzpatrick is a 40 y.o. male [place] and hearing impaired whom is adherent to current regimen of Biktarvy.  He reports today regarding rash on volar surface of bilateral forearms that developed 1 month ago following  yard work. He is unaware if he came into contact with poison ivy/oak or sumac. The rash is raised and extremely pruritic day and night, with minimal relief with OTC hydrocortisone.  Rash has spread to both arms since initial onset.   His concern by his mother was with MPX.  Last sexual encounter was June 2022 receptive anal intercourse with condom use.  No known close contact with anyone known to have rash or MPX. No recent travel to endemic areas. Does not have multiple sexual partners or engage in anonymous sex. No sexual contact 3 weeks prior to onset of rash. He has had same partner since March 2022. Denies recent flu like symptoms or rectal pain.  No Known Allergies    Outpatient Medications Prior to Visit  Medication Sig Dispense Refill   acyclovir (ZOVIRAX) 800 MG tablet Take by mouth.     azaTHIOprine (IMURAN) 50 MG tablet Take by mouth.     bictegravir-emtricitabine-tenofovir AF (BIKTARVY) 50-200-25 MG TABS tablet Take 1 tablet by mouth daily. 30 tablet 9   brimonidine (ALPHAGAN) 0.15 % ophthalmic solution Apply to eye.     dorzolamide (TRUSOPT) 2 % ophthalmic solution Apply to eye.     folic acid (FOLVITE) 1 MG tablet Take 1 tablet by mouth daily.     methotrexate (RHEUMATREX) 2.5 MG tablet Take by mouth.     timolol (TIMOPTIC-XR) 0.25 % ophthalmic gel-forming Apply to eye.     traZODone (DESYREL) 50 MG tablet 1/2- 1 tablet by mouth at bedtime as needed for sleep 30 tablet 2   No  facility-administered medications prior to visit.     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   Qualifier: Diagnosis of  By: [name] MD, [name]     DEPRESSION 03/02/2006   Qualifier: Diagnosis of  By: [name] MD, [name]     diabetes type 2 08/18/2020   Dyslipidemia 07/02/2012   ERECTILE DYSFUNCTION, 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   Qualifier: Diagnosis of  By: [name] MD, [name]     FACIAL RASH 11/17/2008   Qualifier: Diagnosis of  By: [name] MD, [name]     GERD 07/20/2009   Qualifier: Diagnosis of  By: [name] MD, [name]     GERD (gastroesophageal reflux disease)    HIV disease ([place])    Obesity (BMI 30.0-34.9) 04/20/2016   PEDICULOSIS PUBIS (PUBIC LOUSE) 06/20/2006   Annotation: 4/06 Qualifier: Diagnosis of  By: [name] MD, Jenny Reichmann  Poor dentition 09/21/2016   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  Negative for chills and fever.  HENT:  Positive for hearing loss (hearing impaired). Negative for congestion, rhinorrhea  and sinus pain.   Cardiovascular:  Negative for chest pain.  Gastrointestinal:  Negative for blood in stool, diarrhea, nausea, rectal pain and vomiting.  Genitourinary:  Negative for frequency, genital sores, hematuria, penile discharge, penile pain, testicular pain and urgency.  Musculoskeletal:  Negative for back pain and myalgias.  Skin:  Positive for rash.  Hematological:  Negative for adenopathy.     Objective:    BP 124/83   Pulse 67   Temp 97.9 F (36.6 C)   Resp 16  Nursing note and vital signs reviewed. ASL video interpreter present throughout visit  Physical Exam Vitals reviewed. Exam conducted with a chaperone present.  Constitutional:      General: He is not in acute distress.    Appearance: Normal appearance. He is obese. He is not ill-appearing.  Cardiovascular:     Rate and Rhythm: Normal rate and regular rhythm.     Pulses: Normal pulses.  Pulmonary:     Effort: Pulmonary effort is normal.     Breath sounds: Normal breath sounds.  Skin:         Comments: Bilateral volar surface of lower extremities linear pattern of papular rash, with surrounding excoriations, no exudate, discharge, central umbilication, or signs of secondary infection.  Rash appears dry and consistent with rhus oil spread  Neurological:     Mental Status: He is alert.     Depression screen [facility] 2/9 08/17/2020 01/01/2020 10/16/2019 04/21/2019 09/12/2018  Decreased Interest 0 0 0 0 0  Down, Depressed, Hopeless 0 0 0 0 0  PHQ - 2 Score 0 0 0 0 0  Some recent data might be hidden       Assessment & Plan:  Rash presentation appears clinically consistent with poison ivy dermatitis.  Rx for hydroxyzine 25 mg prn tid recommenced at night due to drowsiness Clobetasol 0.05% ointment apply judicially to effected area twice daily x 2 weeks, wash hands following application  STI screening-continue use of condoms and lubricant, advised to follow up with [facility] health department for MPX vaccine. Provided  information regarding transmission, vaccines, signs and symptoms and risks of acquiring.  He does not engage in anonymous sex or sexual activity in the last 2  months.  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   Palpitations 10/19/2016   Dyspnea on exertion  10/19/2016   Poor dentition 09/21/2016   Pulmonary nodule 08/17/2016   Obesity (BMI 30.0-34.9) 04/20/2016   Dyslipidemia 07/02/2012   GERD (gastroesophageal reflux disease) 06/28/2010   ERECTILE DYSFUNCTION, 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   Hearing loss 03/26/2006   Human immunodeficiency virus (HIV) disease ([place]) 03/02/2006   ANXIETY 03/02/2006   Depression 03/02/2006   ALLERGIC RHINITIS 03/02/2006     Problem List Items Addressed This Visit   None Visit Diagnoses     Poison ivy dermatitis    -  Primary   Relevant Medications   clobetasol ointment (TEMOVATE) 0.05 %   hydrOXYzine (ATARAX/VISTARIL) 25 MG tablet   Routine screening for STI (sexually transmitted infection)       Relevant Orders   Cytology (oral, anal, urethral) ancillary only   Urine cytology ancillary only   RPR   Cytology (oral, anal, urethral) ancillary only        I am having Matthew L. Shed "[name]" start on clobetasol ointment and hydrOXYzine. I am also having him maintain his Biktarvy, traZODone, brimonidine, dorzolamide, acyclovir, timolol, azaTHIOprine, folic acid, and methotrexate.   Meds ordered this encounter  Medications   clobetasol ointment (TEMOVATE) 0.05 %    Sig: Apply 1 application topically 2 (two) times daily.    Dispense:  30 g    Refill:  0    Order Specific  Question:   Supervising Provider    Answer:   VAN DAM, [name] N [[id number]]   hydrOXYzine (ATARAX/VISTARIL) 25 MG tablet    Sig: Take 1 tablet (25 mg total) by mouth 3 (three) times daily as needed for itching (will cause drowsiness best to use at bedtime).    Dispense:  30 tablet    Refill:  0    Order Specific Question:   Supervising Provider    Answer:   VAN DAM, [name] N [[id number]]     Follow-up: Return if symptoms worsen or fail to improve.

## 2020-10-06 NOTE — Patient Instructions (Signed)
Hydroxyzine 25 mg may be taken as needed 3 times a day for itching may cause drowsiness so best to take this at bedtime Clobetasol ointment twice daily apply small amount to effected areas only twice daily for no more than 2 weeks  I do not think rash is clinically consistent with monkey pox in appearance or by history, therefore there is no need to swab lesions.  Sexually transmitted screening for syphilis and gonorrhea and chlamydia today  Follow up with Dr. [name] in 2 weeks

## 2020-10-07 LAB — CYTOLOGY, (ORAL, ANAL, URETHRAL) ANCILLARY ONLY
Chlamydia: NEGATIVE
Chlamydia: NEGATIVE
Comment: NEGATIVE
Comment: NEGATIVE
Comment: NORMAL
Comment: NORMAL
Neisseria Gonorrhea: NEGATIVE
Neisseria Gonorrhea: NEGATIVE

## 2020-10-07 LAB — URINE CYTOLOGY ANCILLARY ONLY
Chlamydia: NEGATIVE
Comment: NEGATIVE
Comment: NORMAL
Neisseria Gonorrhea: NEGATIVE

## 2020-10-07 LAB — HIV-1 RNA QUANT-NO REFLEX-BLD
HIV 1 RNA Quant: 55 Copies/mL — ABNORMAL HIGH
HIV-1 RNA Quant, Log: 1.74 Log cps/mL — ABNORMAL HIGH

## 2020-10-11 ENCOUNTER — Telehealth: Payer: Self-pay

## 2020-10-11 NOTE — Telephone Encounter (Signed)
Patient returning call with ASL video interpreter. RN relayed negative gonorrhea and chlamydia results.   Patient states the rash has gotten a little better, but not completely. He says that he has been using cortisone cream and isopropyl alcohol. He has not yet picked up the clobetasol that was prescribed, but states he will do that today.   RN advised him to stop using alcohol on the rash as that can increase dryness and irritation. Instructed him to try the clobetasol that was ordered and to let us know if the rash does not improve. Patient verbalized understanding and has no further questions.   Beryle Flock, RN

## 2020-10-11 NOTE — Telephone Encounter (Signed)
-----   Message from Robert Bellow, [place] sent at 10/11/2020  9:21 AM EDT ----- He will need ASL video interpreter he told me he does not use Mychart. Please notify gonorrhea and chlamydia were negative, for some reason his RPR test was not drawn, nevertheless his rash appeared to be poison ivy.  Please let me know if his dermatitis did not respond to topic agent.

## 2020-10-11 NOTE — Telephone Encounter (Signed)
Left HIPAA compliant voicemail with ASL video interpreter requesting callback.   Beryle Flock, RN

## 2020-10-19 ENCOUNTER — Emergency Department (HOSPITAL_BASED_OUTPATIENT_CLINIC_OR_DEPARTMENT_OTHER)
Admission: EM | Admit: 2020-10-19 | Discharge: 2020-10-20 | Disposition: A | Discharge status: Home / Self Care | Payer: Medicare Other | Attending: Emergency Medicine | Admitting: Emergency Medicine

## 2020-10-19 ENCOUNTER — Other Ambulatory Visit: Payer: Self-pay

## 2020-10-19 ENCOUNTER — Ambulatory Visit: Payer: Medicare Other

## 2020-10-19 ENCOUNTER — Telehealth (INDEPENDENT_AMBULATORY_CARE_PROVIDER_SITE_OTHER): Payer: Medicare Other | Admitting: Internal Medicine

## 2020-10-19 DIAGNOSIS — Z21 Asymptomatic human immunodeficiency virus [HIV] infection status: Secondary | ICD-10-CM | POA: Diagnosis not present

## 2020-10-19 DIAGNOSIS — Z79899 Other long term (current) drug therapy: Secondary | ICD-10-CM | POA: Diagnosis not present

## 2020-10-19 DIAGNOSIS — R21 Rash and other nonspecific skin eruption: Secondary | ICD-10-CM | POA: Diagnosis present

## 2020-10-19 DIAGNOSIS — R197 Diarrhea, unspecified: Secondary | ICD-10-CM

## 2020-10-19 DIAGNOSIS — B04 Monkeypox: Secondary | ICD-10-CM | POA: Insufficient documentation

## 2020-10-19 DIAGNOSIS — E1165 Type 2 diabetes mellitus with hyperglycemia: Secondary | ICD-10-CM | POA: Diagnosis not present

## 2020-10-19 DIAGNOSIS — E1169 Type 2 diabetes mellitus with other specified complication: Secondary | ICD-10-CM | POA: Diagnosis not present

## 2020-10-19 DIAGNOSIS — Z20822 Contact with and (suspected) exposure to covid-19: Secondary | ICD-10-CM | POA: Insufficient documentation

## 2020-10-19 DIAGNOSIS — K529 Noninfective gastroenteritis and colitis, unspecified: Secondary | ICD-10-CM | POA: Diagnosis not present

## 2020-10-19 DIAGNOSIS — B20 Human immunodeficiency virus [HIV] disease: Secondary | ICD-10-CM

## 2020-10-19 DIAGNOSIS — K6289 Other specified diseases of anus and rectum: Secondary | ICD-10-CM | POA: Diagnosis not present

## 2020-10-19 DIAGNOSIS — E785 Hyperlipidemia, unspecified: Secondary | ICD-10-CM | POA: Insufficient documentation

## 2020-10-19 DIAGNOSIS — J029 Acute pharyngitis, unspecified: Secondary | ICD-10-CM | POA: Diagnosis not present

## 2020-10-19 LAB — CBC WITH DIFFERENTIAL/PLATELET
Abs Immature Granulocytes: 0.04 10*3/uL (ref 0.00–0.07)
Basophils Absolute: 0.1 10*3/uL (ref 0.0–0.1)
Basophils Relative: 2 %
Eosinophils Absolute: 0.3 10*3/uL (ref 0.0–0.5)
Eosinophils Relative: 4 %
HCT: 41.8 % (ref 39.0–52.0)
Hemoglobin: 14.2 g/dL (ref 13.0–17.0)
Immature Granulocytes: 0 %
Lymphocytes Relative: 50 %
Lymphs Abs: 4.6 10*3/uL — ABNORMAL HIGH (ref 0.7–4.0)
MCH: 30.7 pg (ref 26.0–34.0)
MCHC: 34 g/dL (ref 30.0–36.0)
MCV: 90.5 fL (ref 80.0–100.0)
Monocytes Absolute: 0.9 10*3/uL (ref 0.1–1.0)
Monocytes Relative: 9 %
Neutro Abs: 3.2 10*3/uL (ref 1.7–7.7)
Neutrophils Relative %: 35 %
Platelets: 277 10*3/uL (ref 150–400)
RBC: 4.62 MIL/uL (ref 4.22–5.81)
RDW: 12.5 % (ref 11.5–15.5)
Smear Review: NORMAL
WBC: 9.3 10*3/uL (ref 4.0–10.5)
nRBC: 0 % (ref 0.0–0.2)

## 2020-10-19 LAB — RESP PANEL BY RT-PCR (FLU A&B, COVID) ARPGX2
Influenza A by PCR: NEGATIVE
Influenza B by PCR: NEGATIVE
SARS Coronavirus 2 by RT PCR: NEGATIVE

## 2020-10-19 LAB — COMPREHENSIVE METABOLIC PANEL
ALT: 22 U/L (ref 0–44)
AST: 21 U/L (ref 15–41)
Albumin: 3.8 g/dL (ref 3.5–5.0)
Alkaline Phosphatase: 86 U/L (ref 38–126)
Anion gap: 9 (ref 5–15)
BUN: 9 mg/dL (ref 6–20)
CO2: 29 mmol/L (ref 22–32)
Calcium: 8.6 mg/dL — ABNORMAL LOW (ref 8.9–10.3)
Chloride: 98 mmol/L (ref 98–111)
Creatinine, Ser: 1.05 mg/dL (ref 0.61–1.24)
GFR, Estimated: >60 mL/min (ref >60)
Glucose, Bld: 109 mg/dL — ABNORMAL HIGH (ref 70–99)
Potassium: 3.6 mmol/L (ref 3.5–5.1)
Sodium: 136 mmol/L (ref 135–145)
Total Bilirubin: 0.2 mg/dL — ABNORMAL LOW (ref 0.3–1.2)
Total Protein: 8.3 g/dL — ABNORMAL HIGH (ref 6.5–8.1)

## 2020-10-19 LAB — LIPASE, BLOOD: Lipase: 23 U/L (ref 11–51)

## 2020-10-19 MED ORDER — LACTATED RINGERS IV BOLUS
1000.0000 mL | Freq: Once | INTRAVENOUS | Status: AC
  Administered 2020-10-19: 1000 mL via INTRAVENOUS

## 2020-10-19 MED ORDER — BOOST / RESOURCE BREEZE PO LIQD CUSTOM: 1.0000 | Freq: Two times a day (BID) | ORAL | Status: DC

## 2020-10-19 MED ORDER — TECOVIRIMAT 200 MG PO CAPS
600.0000 mg | ORAL_CAPSULE | Freq: Two times a day (BID) | ORAL | Status: DC
Start: 2020-10-20 — End: 2020-10-19

## 2020-10-19 MED ORDER — CEFTRIAXONE SODIUM 500 MG IJ SOLR
500.0000 mg | Freq: Once | INTRAMUSCULAR | Status: AC
  Administered 2020-10-19: 500 mg via INTRAMUSCULAR
  Filled 2020-10-19: qty 500

## 2020-10-19 MED ORDER — ACETAMINOPHEN 325 MG PO TABS
650.0000 mg | ORAL_TABLET | Freq: Four times a day (QID) | ORAL | Status: DC | PRN
  Administered 2020-10-19: 650 mg via ORAL
  Filled 2020-10-19: qty 2

## 2020-10-19 MED ORDER — DOXYCYCLINE HYCLATE 100 MG PO CAPS: 100.0000 mg | ORAL_CAPSULE | Freq: Two times a day (BID) | ORAL | 0 refills | Status: AC

## 2020-10-19 NOTE — ED Triage Notes (Signed)
Rash on his face.

## 2020-10-19 NOTE — Progress Notes (Deleted)
Virtual Visit via Telephone Note  I connected with [name] on 10/19/20 at  2:00 PM EDT by telephone and verified that I am speaking with the correct person using two identifiers.  Location: Patient: Home Provider: [facility]   I discussed the limitations, risks, security and privacy concerns of performing an evaluation and management service by telephone and the availability of in person appointments. I also discussed with the patient that there may be a patient responsible charge related to this service. The patient expressed understanding and agreed to proceed.   History of Present Illness:    Observations/Objective:   Assessment and Plan:   Follow Up Instructions:    I discussed the assessment and treatment plan with the patient. The patient was provided an opportunity to ask questions and all were answered. The patient agreed with the plan and demonstrated an understanding of the instructions.   The patient was advised to call back or seek an in-person evaluation if the symptoms worsen or if the condition fails to improve as anticipated.  I provided *** minutes of non-face-to-face time during this encounter.   Michel Bickers, MD

## 2020-10-19 NOTE — Progress Notes (Signed)
Virtual Visit via Video Note  I connected with Matthew Fitzpatrick on 10/19/20 at  2:00 PM EDT by a video enabled telemedicine application and verified that I am speaking with the correct person using two identifiers.  Location: Patient: Home Provider: [facility]   I discussed the limitations of evaluation and management by telemedicine and the availability of in person appointments. The patient expressed understanding and agreed to proceed.  History of Present Illness: I conducted a video visit with Matthew Fitzpatrick today with the aid of the American sign language interpreter.  He was in recently and saw my partner because of concerns that he might have much hypoxia.  He again says that he has not been sexually active since early June.  When seen recently he was diagnosed with probable poison ivy dermatitis.  He says he went to the health department and was tested negative for monkeypox.  He recently took some Allegra because he thought he was suffering from allergies.  After taking his last dose last week he developed a new rash on his face and some increased congestion in his throat.  He is worried that he may have had an allergic reaction to Allegra.  He says that he shook hands with someone recently and is still worried that that person may have given him monkey pox.  He also told me that he has been having some rectal leakage.  He was here recently all site testing for [name] and chlamydia was negative.  He says he is not having diarrhea.   Observations/Objective: He has a few small papular lesions on his face.  Assessment and Plan: Him that his history and clinical findings did not suggest monkey pox.  Follow Up Instructions: He will come in next week for further evaluation of his facial rash and rectal leakage.   I discussed the assessment and treatment plan with the patient. The patient was provided an opportunity to ask questions and all were answered. The patient agreed with the plan and demonstrated  an understanding of the instructions.   The patient was advised to call back or seek an in-person evaluation if the symptoms worsen or if the condition fails to improve as anticipated.  I provided 17 minutes of non-face-to-face time during this encounter.   Michel Bickers, MD

## 2020-10-19 NOTE — ED Triage Notes (Signed)
Interpreter for [employer] Language Pt. Believes he was exposed to monkey pox by shaking hands with a stranger at work.   Had poison Karlene Einstein that started 3 weeks ago and was mainly on left arm.    Now has rash all over and states his buttocks is painful.  Has macerated looking area just above the rectum.

## 2020-10-19 NOTE — ED Provider Notes (Signed)
[facility] Provider Note   CSN: [id number] Arrival date & time: 10/19/20  2027     History Chief Complaint  Patient presents with   Rash    Matthew Fitzpatrick is a 40 y.o. male.  HPI     40yo male with history of HIV, DM, hyperlipidemia, ulcerative rectosigmoid colitis who presents with concern for rash, rectal pain, rash, cough, sore throat.   Rash and cough started one week ago Is painful and itchy Trying rubbing alcohol to clean it and make it less itchy Last week was mowing and doing yard work and had poison ivy 3 weeks ago and not sure if remaining, did have monkeypox testing at that time which was negative Sore throat , using halls cough drops for one week No headache  The moment after taking the benadryl he started having sore throat, anal leakage, was mowing doing yard work, breathing in pollen  No fever, nausea and vomiting last week and over the weekend Diarrhea,  every 10-20 minutes, diarrhea red, brown and mucous and is bloody Not having abdominal pain, is having rectal pain, mucuous with red  No known sick contacts, 4 weeks ago was tested for monkey pox but it was poison ivy, another doctor had it 2 weeks ago, started on face, spread to body and legs, anus Unable to get monkeypox vavccine-tried calling health department.     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   Qualifier: Diagnosis of  By: [name] MD, [name]     DEPRESSION 03/02/2006   Qualifier: Diagnosis of   By: [name] MD, [name]     diabetes type 2 08/18/2020   Dyslipidemia 07/02/2012   ERECTILE DYSFUNCTION, 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   Qualifier: Diagnosis of  By: [name] MD, [name]     FACIAL RASH 11/17/2008   Qualifier: Diagnosis of  By: [name] MD, [name]     GERD 07/20/2009   Qualifier: Diagnosis of  By: [name] MD, [name]     GERD (gastroesophageal reflux disease)    HIV disease ([place])    Obesity (BMI 30.0-34.9) 04/20/2016   PEDICULOSIS PUBIS (PUBIC LOUSE) 06/20/2006   Annotation: 4/06 Qualifier: Diagnosis of  By: [name] MD, [name]     Poor dentition 09/21/2016   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, 11/07 Qualifier: Diagnosis of  By: [name] MD, [name]     Pulmonary nodule    SINUSITIS, CHRONIC 02/18/2009   Qualifier: Diagnosis of  By: Tommy Medal MD, [name]     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   Ulcerative proctosigmoiditis, 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   Urinary frequency 04/06/2009   Qualifier: Diagnosis of  By: [name] MD, [name]      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   Diabetes type 2, controlled ([place]) 08/18/2020   Insomnia 08/17/2020   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   Pes planus 10/02/2018   Left ankle pain  10/02/2018   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   Chlamydia infection 03/29/2018   Ulcerative proctosigmoiditis, 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   Palpitations 10/19/2016   Dyspnea on exertion 10/19/2016   Poor dentition 09/21/2016   Pulmonary nodule 08/17/2016   Obesity (BMI 30.0-34.9) 04/20/2016   Dyslipidemia 07/02/2012   GERD (gastroesophageal reflux disease) 06/28/2010   ERECTILE DYSFUNCTION, 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   Hearing loss 03/26/2006   Human immunodeficiency virus (HIV) disease ([place]) 03/02/2006   ANXIETY 03/02/2006   Depression 03/02/2006   ALLERGIC RHINITIS 03/02/2006    Past Surgical History:  Procedure Laterality Date   colonoscopy     THROAT  SURGERY     UMBILICAL HERNIA REPAIR         Family History  Problem Relation Age of Onset   Headache Mother    Hypertension Mother    Heart attack Father 36   Sudden Cardiac Death Neg Hx    Colon cancer Neg Hx    Liver disease Neg Hx    Esophageal cancer Neg Hx    Rectal cancer Neg Hx    Stomach cancer Neg Hx     Social History   Tobacco Use   Smoking status: Never   Smokeless tobacco: Never  Vaping Use   Vaping Use: Never used  Substance Use Topics   Alcohol use: No    Alcohol/week: 0.0 standard drinks   Drug use: No    Home Medications Prior to Admission medications   Medication Sig Start Date End Date Taking? Authorizing Provider  doxycycline (VIBRAMYCIN) 100 MG capsule Take 1 capsule (100 mg total) by mouth 2 (two) times daily for 14 days. 10/19/20 11/02/20 Yes Gareth Morgan, MD  acyclovir (ZOVIRAX) 800 MG tablet Take by mouth. 07/27/20   [provider]  azaTHIOprine (IMURAN) 50 MG tablet Take by mouth. 04/29/20   [provider]  bictegravir-emtricitabine-tenofovir AF (BIKTARVY) 50-200-25 MG TABS tablet Take 1 tablet by mouth daily. 05/27/20   Michel Bickers, MD  brimonidine [facility]) 0.15 % ophthalmic solution Apply to eye. 08/11/20 08/11/21  [provider]  clobetasol ointment (TEMOVATE) 6.14 % Apply 1 application topically 2 (two) times daily. 10/06/20   Robert Bellow, PA-C  dorzolamide (TRUSOPT) 2 % ophthalmic solution Apply to eye. 08/11/20   [provider]  folic acid (FOLVITE) 1 MG tablet Take 1 tablet by mouth daily. 07/27/20   [provider]  hydrOXYzine (ATARAX/VISTARIL) 25 MG tablet Take 1 tablet (25 mg total) by mouth 3 (three) times daily as needed for itching (will cause drowsiness best to use at bedtime). 10/06/20   Robert Bellow, PA-C  methotrexate (RHEUMATREX) 2.5 MG tablet Take by mouth. 07/27/20   [provider]  timolol (TIMOPTIC-XR) 0.25 % ophthalmic gel-forming Apply to eye. 08/11/20    [provider]  traZODone (DESYREL) 50 MG tablet 1/2- 1 tablet by mouth at bedtime as needed for sleep 09/15/20   [name], NP    Allergies    Patient has no known allergies.  Review of Systems   Review of Systems  Constitutional:  Negative for fever.  HENT:  Positive for sore throat.   Respiratory:  Positive for cough. Negative for shortness of breath.   Cardiovascular:  Negative for chest pain.  Gastrointestinal:  Positive for anal bleeding, blood in stool and rectal pain. Negative for abdominal pain, diarrhea, nausea and vomiting.  Skin:  Positive for rash.  Neurological:  Negative for headaches.   Physical Exam Updated Vital Signs BP 133/90 (BP Location: Left  Arm)   Pulse 73   Temp 98.3 F (36.8 C) (Oral)   Resp 16   Ht 5' 8"  (1.727 m)   Wt 106.1 kg   SpO2 99%   BMI 35.58 kg/m   Physical Exam Vitals and nursing note reviewed.  Constitutional:      General: He is not in acute distress.    Appearance: He is well-developed. He is not diaphoretic.  HENT:     Head: Normocephalic and atraumatic.     Comments: Pharyngeal vesicle Eyes:     Conjunctiva/sclera: Conjunctivae normal.  Cardiovascular:     Rate and Rhythm: Normal rate and regular rhythm.     Heart sounds: Normal heart sounds. No murmur heard.   No friction rub. No gallop.  Pulmonary:     Effort: Pulmonary effort is normal. No respiratory distress.     Breath sounds: Normal breath sounds. No wheezing or rales.  Abdominal:     General: There is no distension.     Palpations: Abdomen is soft.     Tenderness: There is no abdominal tenderness. There is no guarding.  Genitourinary:    Comments: Ulceration along gluteal cleft, appearance of circular ulcerations perirectal Musculoskeletal:     Cervical back: Normal range of motion.  Skin:    General: Skin is warm and dry.     Findings: Rash present.     Comments: See photo. Vesicles  Neurological:     Mental Status: He is alert and  oriented to person, place, and time.         ED Results / Procedures / Treatments   Labs (all labs ordered are listed, but only abnormal results are displayed) Labs Reviewed  CBC WITH DIFFERENTIAL/PLATELET - Abnormal; Notable for the following components:      Result Value   Lymphs Abs 4.6 (*)    All other components within normal limits  COMPREHENSIVE METABOLIC PANEL - Abnormal; Notable for the following components:   Glucose, Bld 109 (*)    Calcium 8.6 (*)    Total Protein 8.3 (*)    Total Bilirubin 0.2 (*)    All other components within normal limits  RESP PANEL BY RT-PCR (FLU A&B, COVID) ARPGX2  GASTROINTESTINAL PANEL BY PCR, STOOL (REPLACES STOOL CULTURE)  MONKEYPOX VIRUS DNA, QUALITATIVE REAL-TIME PCR  LIPASE, BLOOD  RPR  GC/CHLAMYDIA PROBE AMP ([place]) NOT AT [facility]    EKG None  Radiology No results found.  Procedures Procedures   Medications Ordered in ED Medications  acetaminophen (TYLENOL) tablet 650 mg (650 mg Oral Given 10/19/20 2350)  feeding supplement (BOOST / RESOURCE BREEZE) liquid 1 Container (has no administration in time range)  lactated ringers bolus 1,000 mL (0 mLs Intravenous Stopped 10/19/20 2257)  lactated ringers bolus 1,000 mL (0 mLs Intravenous Stopped 10/19/20 2354)  cefTRIAXone (ROCEPHIN) injection 500 mg (500 mg Intramuscular Given 10/19/20 2315)    ED Course  I have reviewed the triage vital signs and the nursing notes.  Pertinent labs & imaging results that were available during my care of the patient were reviewed by me and considered in my medical decision making (see chart for details).    MDM Rules/Calculators/A&P                             40yo male with history of HIV, DM, hyperlipidemia, ulcerative rectosigmoid colitis who presents with concern for rash, rectal pain, rash, cough, sore throat.   Rash and  history not consistent with SJS or allergic reaction. No abd pain or tenderness. No sign of perirectal abscess on  exam.  Does have significant rectal pain, ulcerations.  RPR, GC/Chl sent.  Ordered GI pathogen panel however did not have diarrhea in ED.  Suspect proctitis, viral etiology of combination of symptoms.  Rash with vesicles concering for monkeypox.  Sent swabs, recommend close PCP follow up. Treated for proctitis, given rocephin/rx for doxycycline. Recommend follow up with ID clinic, quarantine.     Final Clinical Impression(s) / ED Diagnoses Final diagnoses:  Monkeypox  Rash  Proctitis  Diarrhea, unspecified type  Pharyngitis, unspecified etiology    Rx / DC Orders ED Discharge Orders          Ordered    doxycycline (VIBRAMYCIN) 100 MG capsule  2 times daily        10/19/20 2354             Gareth Morgan, MD 10/20/20 0200

## 2020-10-20 ENCOUNTER — Telehealth: Payer: Self-pay | Admitting: Family

## 2020-10-20 NOTE — Telephone Encounter (Signed)
Lvm with an sign language interpreter for patient to call and schedule a virtual visit.

## 2020-10-20 NOTE — Telephone Encounter (Signed)
I saw that he was in the ER yesterday for possible Monkeypox.  I would like to arrange a virtual appointment with him Monday please to follow up.

## 2020-10-21 LAB — GC/CHLAMYDIA PROBE AMP (~~LOC~~) NOT AT ARMC
Chlamydia: NEGATIVE
Comment: NEGATIVE
Comment: NORMAL
Neisseria Gonorrhea: NEGATIVE

## 2020-10-21 LAB — RPR
RPR Ser Ql: REACTIVE — AB
RPR Titer: 1:32 {titer}

## 2020-10-21 LAB — MONKEYPOX VIRUS DNA, QUALITATIVE REAL-TIME PCR: Orthopoxvirus DNA, QL PCR: DETECTED — AB

## 2020-10-21 NOTE — Telephone Encounter (Signed)
Patient called and is scheduled for virtual visit on 10-25-20

## 2020-10-22 ENCOUNTER — Ambulatory Visit (INDEPENDENT_AMBULATORY_CARE_PROVIDER_SITE_OTHER): Payer: Medicare Other | Admitting: Physician Assistant

## 2020-10-22 ENCOUNTER — Encounter: Payer: Self-pay | Admitting: Physician Assistant

## 2020-10-22 ENCOUNTER — Telehealth: Payer: Self-pay

## 2020-10-22 ENCOUNTER — Other Ambulatory Visit: Payer: Self-pay

## 2020-10-22 VITALS — BP 143/91 | HR 82 | Temp 98.0°F | Resp 16 | Wt 242.4 lb

## 2020-10-22 DIAGNOSIS — B04 Monkeypox: Secondary | ICD-10-CM

## 2020-10-22 HISTORY — DX: Monkeypox: B04

## 2020-10-22 LAB — T.PALLIDUM AB, TOTAL: T Pallidum Abs: REACTIVE — AB

## 2020-10-22 MED ORDER — TPOXX 200 MG PO CAPS: 600.0000 mg | ORAL_CAPSULE | Freq: Two times a day (BID) | ORAL | 0 refills | Status: AC

## 2020-10-22 NOTE — Patient Instructions (Signed)
Take TPOXX twice daily x 14 days with high fatty meal and water as discussed Please notify us if you experience any side effects or new lesions present Remain in isolation Next visit 10/26/20 will be virtual Final visit 11/05/2020 at 11:30 with Claiborne Billings Continue all other medications Disinfect surfaces at home and sterilize linens and clothes

## 2020-10-22 NOTE — Progress Notes (Signed)
Counseled patient on tecovirimat administration. Patient will take 3 capsules (600 mg) by mouth twice daily for 14 days. Gave 84 tablets to complete treatment. Counseled to take with a full, fatty meal of at least 600 calories and 25 gm of fat. Gave him a chart to monitor his lesions and document his doses. Counseled that the most common side effect is headaches. He will reach out if he has any questions or concerns.

## 2020-10-22 NOTE — Progress Notes (Signed)
Subjective:    Patient ID: Matthew Fitzpatrick, male    DOB: [birth year], 40 y.o.   MRN: [id number]  Chief Complaint  Patient presents with   Treatment Planning    Confirmed MPX 10/19/20 by ED here for TPOXX treatment, arrived with ASL interpreter     Pt arrives with ASL interpreter and is present throughout the entire visit. HPI:  Matthew Fitzpatrick is a 40 y.o. male hearing impaired, HIV positive-suppressed, (adherent to current regimen of BIKTARVY) presents today with confirmed diagnosis of Human Monkeypox and is in need of treatment due to sensitive anatomical locations of lesions (anogenital region and several along eyelid), pain at lesion sites, and proctitis.  Diagnosed 10/19/2020 by [facility] health ED. Onset of symptoms began 10/15/2020 on hand and disseminated very quickly.  Experienced prodromal symptoms by 1 day prior to lesion onset of fever, chills, fatigue, diarrhea, rectal pain, anal leakage.  Lesions are raised, vesicular, red, some pain along lesions at foot. Approximately 80 lesions. He has no known allergies to tecoviramat or its excipients, no hx of eczema/asthma. He is MSM with no sexual activity since early June.  He has had no known contacts with similar lesions.  He lives alone, has no pets. Prior to onset of symptoms he had "shaken a mans hand at the gas station, otherwise zero other contacts in the 10 days leading up to symptoms.  Explained Extended Access-IND of TPOXX -read and explained informed consent to patient-provided time to answer all questions.   No Known Allergies    Outpatient Medications Prior to Visit  Medication Sig Dispense Refill   acyclovir (ZOVIRAX) 800 MG tablet Take by mouth.     azaTHIOprine (IMURAN) 50 MG tablet Take by mouth.     bictegravir-emtricitabine-tenofovir AF (BIKTARVY) 50-200-25 MG TABS tablet Take 1 tablet by mouth daily. 30 tablet 9   brimonidine (ALPHAGAN) 0.15 % ophthalmic solution Apply to eye.     clobetasol ointment  (TEMOVATE) 3.52 % Apply 1 application topically 2 (two) times daily. 30 g 0   dorzolamide (TRUSOPT) 2 % ophthalmic solution Apply to eye.     doxycycline (VIBRAMYCIN) 100 MG capsule Take 1 capsule (100 mg total) by mouth 2 (two) times daily for 14 days. 28 capsule 0   folic acid (FOLVITE) 1 MG tablet Take 1 tablet by mouth daily.     hydrOXYzine (ATARAX/VISTARIL) 25 MG tablet Take 1 tablet (25 mg total) by mouth 3 (three) times daily as needed for itching (will cause drowsiness best to use at bedtime). 30 tablet 0   methotrexate (RHEUMATREX) 2.5 MG tablet Take by mouth.     timolol (TIMOPTIC-XR) 0.25 % ophthalmic gel-forming Apply to eye.     traZODone (DESYREL) 50 MG tablet 1/2- 1 tablet by mouth at bedtime as needed for sleep 30 tablet 2   No facility-administered medications prior to visit.     Past Medical History:  Diagnosis Date   Acute conjunctivitis 02/12/2014   Acute gastroenteritis 08/12/2015   ANXIETY 03/02/2006   Qualifier: Diagnosis of  By: [name] MD, [name]     Bronchitis 01/26/2011   CAP (community acquired pneumonia)    CHICKENPOX, HX OF 06/20/2006   Annotation: age 53 Qualifier: Diagnosis of  By: [name] MD, [name]     Chlamydia infection 03/29/2018   COUGH, CHRONIC 12/23/2008   Qualifier: Diagnosis of  By: [name] MD, [name]     Deaf    DEAFNESS, 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   Qualifier: Diagnosis of  By: [name] MD, Jenny Reichmann  DEPRESSION 03/02/2006   Qualifier: Diagnosis of  By: [name] MD, [name]     diabetes type 2 08/18/2020   Dyslipidemia 07/02/2012   ERECTILE DYSFUNCTION, 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   Qualifier: Diagnosis of  By: [name] MD, [name]     FACIAL RASH 11/17/2008   Qualifier: Diagnosis of  By: [name] MD, [name]     GERD 07/20/2009   Qualifier: Diagnosis of  By: [name] MD, [name]     GERD (gastroesophageal reflux disease)    HIV disease ([place])    Obesity (BMI 30.0-34.9) 04/20/2016   PEDICULOSIS PUBIS (PUBIC LOUSE) 06/20/2006   Annotation: 4/06 Qualifier:  Diagnosis of  By: [name] MD, [name]     Poor dentition 09/21/2016   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, 11/07 Qualifier: Diagnosis of  By: [name] MD, [name]     Pulmonary nodule    SINUSITIS, CHRONIC 02/18/2009   Qualifier: Diagnosis of  By: Tommy Medal MD, [name]     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   Ulcerative proctosigmoiditis, 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 Negative for appetite change and fever.  HENT:  Positive for hearing loss and mouth sores. Negative for rhinorrhea.   Eyes:  Negative for photophobia, pain, discharge, redness, itching and visual disturbance.  Respiratory: Negative.  Negative for cough, shortness of breath and wheezing.   Cardiovascular: Negative.   Gastrointestinal:  Positive for anal bleeding, blood in stool, diarrhea and rectal pain. Negative for abdominal pain, constipation and nausea.  Genitourinary:  Positive for genital sores.  Skin:  Positive for rash.     Objective:    BP (!) 143/91   Pulse 82   Temp 98 F (36.7 C)   Resp 16   Wt 242 lb 6.4 oz (110 kg)   BMI 36.86 kg/m  Nursing note and vital signs reviewed.  Physical Exam Vitals and nursing note reviewed. Exam conducted with a chaperone present.  Constitutional:      Appearance: Normal appearance. He is obese.  HENT:     Head: Normocephalic and atraumatic.  Cardiovascular:     Rate and Rhythm: Normal rate and regular rhythm.  Pulmonary:     Effort: Pulmonary effort is normal.     Breath sounds: Normal breath sounds.  Musculoskeletal:     Cervical back: Normal range of motion and neck supple.  Lymphadenopathy:     Cervical: Cervical adenopathy present.  Skin:    Findings: Lesion present.      Comments: Vesicular lesions disseminated over > 50% of body. See photos  Neurological:     Mental Status: He is alert and oriented to person, place, and time.  Psychiatric:        Mood and Affect: Mood normal.        Behavior: Behavior normal.        Thought Content: Thought content normal.        Judgment: Judgment normal.          Depression screen [facility] 2/9 08/17/2020 01/01/2020 10/16/2019 04/21/2019 09/12/2018  Decreased Interest 0 0 0 0 0  Down, Depressed, Hopeless 0 0 0 0 0  PHQ - 2 Score 0 0 0  0 0  Some recent data might be hidden       Assessment & Plan:  Reviewed ER encounter, lab results from 10/19/2020 Obtained informed consent for TPOXX-provided time for patient to ask and answer all questions, I personally read with ASL interpreter to patient. He voiced understanding.  Provided diary to maintain at home and bring at interim visit and final visit.  Discussed all potential side effects and risk for allergic reaction with any new drug.  He will notify us if he continues to have new lesions or Aes.   Reviewed dosing TPOXX 3 capsules twice daily x 14 days 30 mins after high caloric and fatty meal.   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   Palpitations 10/19/2016   Dyspnea on exertion 10/19/2016   Poor dentition 09/21/2016   Pulmonary nodule 08/17/2016   Obesity (BMI 30.0-34.9) 04/20/2016   Dyslipidemia  07/02/2012   GERD (gastroesophageal reflux disease) 06/28/2010   ERECTILE DYSFUNCTION, 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 I am also having him maintain his Biktarvy, traZODone, brimonidine, dorzolamide, acyclovir, timolol, azaTHIOprine, folic acid, methotrexate, clobetasol ointment, hydrOXYzine, and doxycycline.   Meds ordered this encounter  Medications   tecovirimat (TPOXX) capsule    Sig: Take 3 capsules (600 mg total) by mouth 2 (two) times daily with a meal for 14 days.    Dispense:  84 capsule    Refill:  0    Order Specific Question:   Supervising Provider    Answer:   VAN DAM, [name] N [[id number]]      Follow-up: Return in about 2 weeks (around 11/05/2020) for final MPX visit. Has virtual visit with Dr. [name] on August 30 for interim treatment visit

## 2020-10-22 NOTE — Telephone Encounter (Signed)
Patient returned missed call. Is scheduled to see [employer], [place] today at 1130. Patient will need ASL interpreter at visit.  Leatrice Jewels, RMA

## 2020-10-22 NOTE — Telephone Encounter (Signed)
Left message using ASL interpreter requesting a call back to discuss lab results and see if patient can come to office today for treatment of MPX. Ok to schedule per [place], [place].  Konstantinos Cordoba Lorita Officer, RN

## 2020-10-22 NOTE — Telephone Encounter (Signed)
Patient MPX specimens do show positive reaction; patient qualifies for Tpoxx based off of symptoms and proctitis diagnosis per Dr. [name]. Forwarding to pharmacy team to arrange for medication management.   Tayler Heiden Lorita Officer, RN

## 2020-10-23 LAB — MONKEYPOX VIRUS DNA, QUALITATIVE REAL-TIME PCR: Orthopoxvirus DNA, QL PCR: DETECTED — AB

## 2020-10-25 ENCOUNTER — Telehealth: Payer: Medicare Other | Admitting: Family

## 2020-10-25 ENCOUNTER — Ambulatory Visit: Payer: Medicare Other | Admitting: Medical

## 2020-10-25 ENCOUNTER — Other Ambulatory Visit: Payer: Self-pay

## 2020-10-25 DIAGNOSIS — Z91199 Patient's noncompliance with other medical treatment and regimen due to unspecified reason: Secondary | ICD-10-CM

## 2020-10-25 DIAGNOSIS — Z5329 Procedure and treatment not carried out because of patient's decision for other reasons: Secondary | ICD-10-CM

## 2020-10-25 NOTE — Progress Notes (Signed)
CMA attempted to contact pt several times at 2 different numbers with sign language interpreter and also sent video text link with no response.

## 2020-10-27 ENCOUNTER — Other Ambulatory Visit: Payer: Self-pay

## 2020-10-27 ENCOUNTER — Telehealth (INDEPENDENT_AMBULATORY_CARE_PROVIDER_SITE_OTHER): Payer: Medicare Other | Admitting: Internal Medicine

## 2020-10-27 DIAGNOSIS — B04 Monkeypox: Secondary | ICD-10-CM | POA: Diagnosis not present

## 2020-10-27 NOTE — Progress Notes (Signed)
Virtual Visit via Telephone Note  I connected with Matthew Fitzpatrick on 10/27/20 at 11:15 AM EDT by telephone and verified that I am speaking with the correct person using two identifiers.  Location: Patient: Home Provider: [facility]   I discussed the limitations, risks, security and privacy concerns of performing an evaluation and management service by telephone and the availability of in person appointments. I also discussed with the patient that there may be a patient responsible charge related to this service. The patient expressed understanding and agreed to proceed.   History of Present Illness: I called and spoke with Matthew Fitzpatrick today.  He was diagnosed with bulky proximal complicated by proctitis last week.  He started taking TPOXX on 10/21/2020.  He said that he is still feeling unwell.  He still has his rash, diarrhea and rectal pain but was finally able to tell me that all 3 are improving.   Observations/Objective:   Assessment and Plan: Is much hypoxia is resolving slowly with time and TPOXX.  Follow Up Instructions: Complete 14 days of TPOXX Follow-up on 11/05/2020   I discussed the assessment and treatment plan with the patient. The patient was provided an opportunity to ask questions and all were answered. The patient agreed with the plan and demonstrated an understanding of the instructions.   The patient was advised to call back or seek an in-person evaluation if the symptoms worsen or if the condition fails to improve as anticipated.  I provided 15 minutes of non-face-to-face time during this encounter.   Michel Bickers, MD

## 2020-11-05 ENCOUNTER — Ambulatory Visit (INDEPENDENT_AMBULATORY_CARE_PROVIDER_SITE_OTHER): Payer: Medicare Other | Admitting: Physician Assistant

## 2020-11-05 ENCOUNTER — Other Ambulatory Visit: Payer: Self-pay

## 2020-11-05 ENCOUNTER — Encounter: Payer: Self-pay | Admitting: Physician Assistant

## 2020-11-05 DIAGNOSIS — B04 Monkeypox: Secondary | ICD-10-CM | POA: Diagnosis not present

## 2020-11-05 NOTE — Patient Instructions (Signed)
You are no longer transmissible you may return to community Follow up with primary care provider if bleeding persists

## 2020-11-05 NOTE — Progress Notes (Signed)
Subjective:    Patient ID: Matthew Fitzpatrick, male    DOB: 10/01/1980, 40 y.o.   MRN: [id number]  Chief Complaint  Patient presents with   Follow-up    Final visit [facility] [place] and TPOXX treatment     QDI:YMEBRAXE today with in person ASL interpreter  Matthew Fitzpatrick is a 40 y.o. male HIV positive, well controlled and adherent to current ART regimen, presents today for follow up regarding [place] and TPOXX treatment completion. MPX began 10/15/20, started treatment 10/22/20. He completed his last dose last night.  He tolerated well and was adherent with no missed doses. His lesions have all resolved with new skin growth.  He does report to continue to have mild rectal discomfort and minimal dark red blood in stool and on toilet paper after wiping.  He states these symptoms have improved with treatment but are lingering.  Pain is a 3/10 with BM. This has been present since the start of MPX symptoms.  He has had blood in his stool in the past.   His stool has been loose and frequent while taking TPOXX.  He has maintained diary and has returned it to clinic. He denies abdominal pain, SOB, CP, sore throat, HA, fever, chills, myalgias.   No Known Allergies    Outpatient Medications Prior to Visit  Medication Sig Dispense Refill   acyclovir (ZOVIRAX) 800 MG tablet Take by mouth. (Patient not taking: Reported on 10/27/2020)     azaTHIOprine (IMURAN) 50 MG tablet Take by mouth. (Patient not taking: Reported on 10/27/2020)     bictegravir-emtricitabine-tenofovir AF (BIKTARVY) 50-200-25 MG TABS tablet Take 1 tablet by mouth daily. 30 tablet 9   brimonidine (ALPHAGAN) 0.15 % ophthalmic solution Apply to eye. (Patient not taking: Reported on 10/27/2020)     clobetasol ointment (TEMOVATE) 9.40 % Apply 1 application topically 2 (two) times daily. (Patient not taking: Reported on 10/27/2020) 30 g 0   dorzolamide (TRUSOPT) 2 % ophthalmic solution Apply to eye. (Patient not taking: Reported on 7/68/0881)      folic acid (FOLVITE) 1 MG tablet Take 1 tablet by mouth daily. (Patient not taking: Reported on 10/27/2020)     hydrOXYzine (ATARAX/VISTARIL) 25 MG tablet Take 1 tablet (25 mg total) by mouth 3 (three) times daily as needed for itching (will cause drowsiness best to use at bedtime). (Patient not taking: Reported on 10/27/2020) 30 tablet 0   methotrexate (RHEUMATREX) 2.5 MG tablet Take by mouth. (Patient not taking: Reported on 10/27/2020)     tecovirimat (TPOXX) capsule Take 3 capsules (600 mg total) by mouth 2 (two) times daily with a meal for 14 days. 84 capsule 0   timolol (TIMOPTIC-XR) 0.25 % ophthalmic gel-forming Apply to eye. (Patient not taking: Reported on 10/27/2020)     traZODone (DESYREL) 50 MG tablet 1/2- 1 tablet by mouth at bedtime as needed for sleep (Patient not taking: Reported on 10/27/2020) 30 tablet 2   No facility-administered medications prior to visit.     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   Qualifier: Diagnosis of  By: [name] MD, [name]     DEPRESSION 03/02/2006   Qualifier: Diagnosis of  By: [name] MD, [name]     diabetes type 2 08/18/2020   Dyslipidemia 07/02/2012   ERECTILE DYSFUNCTION, 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   Qualifier: Diagnosis of  By: [name] MD, [name]     FACIAL RASH 11/17/2008   Qualifier: Diagnosis of  By: [name] MD, [name]     GERD 07/20/2009   Qualifier: Diagnosis of  By: [name] MD, [name]     GERD (gastroesophageal reflux disease)    HIV disease ([place])    Obesity (BMI 30.0-34.9) 04/20/2016    PEDICULOSIS PUBIS (PUBIC LOUSE) 06/20/2006   Annotation: 4/06 Qualifier: Diagnosis of  By: [name] MD, [name]     Poor dentition 09/21/2016   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, 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  HENT:  Positive for hearing loss. Negative for sore throat.   Eyes:  Negative for visual disturbance.  Respiratory:  Negative for cough, shortness of breath and wheezing.   Cardiovascular:  Negative for chest pain, palpitations and leg swelling.  Gastrointestinal:  Positive for anal bleeding, blood in stool, diarrhea and rectal pain. Negative for abdominal pain, constipation, nausea and vomiting.  Genitourinary:  Negative for difficulty urinating, frequency, genital sores, penile discharge, penile swelling and scrotal swelling.  Musculoskeletal:  Negative for arthralgias and myalgias.  Neurological:  Negative for weakness, light-headedness and headaches.  Hematological:  Negative for adenopathy.  Psychiatric/Behavioral: Negative.       Objective:    There were no vitals taken for this visit. Nursing note and vital signs reviewed.  Physical Exam Constitutional:      General: He is not in acute distress.    Appearance: Normal appearance. He is obese. He is not ill-appearing.  HENT:     Head: Normocephalic and atraumatic.     Nose: Nose normal.  Eyes:     Extraocular Movements: Extraocular movements intact.      Conjunctiva/sclera: Conjunctivae normal.     Pupils: Pupils are equal, round, and reactive to light.  Cardiovascular:     Rate and Rhythm: Normal rate and regular rhythm.  Pulmonary:     Effort: Pulmonary effort is normal.     Breath sounds: Normal breath sounds.  Abdominal:     General: Bowel sounds are normal.     Tenderness: There is no abdominal tenderness. There is no right CVA tenderness, left CVA tenderness or guarding.  Skin:    General: Skin is warm and dry.     Findings: No lesion (MPX lesions resolved hypopigmentation evident at sites).  Neurological:     General: No focal deficit present.     Mental Status: He is alert and oriented to person, place, and time.  Psychiatric:  Mood and Affect: Mood normal.        Behavior: Behavior normal.        Thought Content: Thought content normal.        Judgment: Judgment normal.     Depression screen [facility] 2/9 10/27/2020 08/17/2020 01/01/2020 10/16/2019 04/21/2019  Decreased Interest 0 0 0 0 0  Down, Depressed, Hopeless 0 0 0 0 0  PHQ - 2 Score 0 0 0 0 0  Some recent data might be hidden       Assessment & Plan:  MPX lesions resolved-released from isolation I suspect lingering blood in stool and rectal pain related to diarrhea associated with TPOXX.  He was advised to follow up with PCP in 1 week if it continues to persist TPOXX treatment completed [facility] to [facility].  Reviewed previous labs from ED visit 10/19/20 negative for syphilis, gonorrhea and chlamydia 10/19/20.   Encouraged condom use to prevent STIs Explained a MPX vaccine is not necessary as he has natural immunity for at least 5 years.    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 Celesta Aver "[name]" maintain his Biktarvy, traZODone, brimonidine, dorzolamide, acyclovir, timolol, azaTHIOprine, folic acid, methotrexate, clobetasol ointment, hydrOXYzine, and Tpoxx.   No orders of the defined types were placed in this encounter.    Follow-up: Return if symptoms worsen or fail to improve.

## 2020-11-29 ENCOUNTER — Other Ambulatory Visit: Payer: Medicare Other

## 2020-11-29 ENCOUNTER — Ambulatory Visit: Payer: Medicare Other

## 2020-11-29 ENCOUNTER — Other Ambulatory Visit: Payer: Self-pay

## 2020-11-29 ENCOUNTER — Ambulatory Visit (INDEPENDENT_AMBULATORY_CARE_PROVIDER_SITE_OTHER): Payer: Medicare Other | Admitting: Family

## 2020-11-29 VITALS — BP 137/80 | HR 65 | Temp 98.1°F | Resp 16 | Wt 243.0 lb

## 2020-11-29 DIAGNOSIS — K649 Unspecified hemorrhoids: Secondary | ICD-10-CM | POA: Diagnosis not present

## 2020-11-29 DIAGNOSIS — K625 Hemorrhage of anus and rectum: Secondary | ICD-10-CM | POA: Insufficient documentation

## 2020-11-29 NOTE — Assessment & Plan Note (Addendum)
?   Due to internal hemorrhoids versus known hx of ulcerative proctosigmoiditis. I saw that he had an incomplete colonoscopy due to poor prep. Will refer back to GI for further evaluation.

## 2020-11-29 NOTE — Patient Instructions (Signed)
Apply preparation H rectally twice daily as needed. You can sprinkle epsom salts in a warm tub and soak twice daily as needed for comfort. Keep stools soft by eating lots of fresh fruits/veggies and drinking plenty of water. You can add a fiber supplement over the counter if needed.   You should be contacted about scheduling your appointment with the GI doctor.

## 2020-11-29 NOTE — Progress Notes (Signed)
Subjective:   By signing my name below, I, Matthew Fitzpatrick, attest that this documentation has been prepared under the direction and in the presence of [name] NP. 11/29/2020    Patient ID: Matthew Fitzpatrick, male    DOB: November 15, 1980, 40 y.o.   MRN: [id number]  Chief Complaint  Patient presents with   Hemorrhoids    Here for hemorrhoids flare up, some bleeding    HPI Patient is in today for a office visit. A sign language interpreter is here for today's visit. He reports that he recently recovered from monkey pox.  Hemorrhoids- He complains of painful hemorrhoids since recovering from monkey pox. He has pain inside and outside his rectum. He has tried taking OTC medication to manage his symptoms. He has occasional constipation at this time. He notes that when his stool is hard he finds blood in is stool.  He is interested in seeing a GI specialist to manage his symptoms    Health Maintenance Due  Topic Date Due   FOOT EXAM  Never done   OPHTHALMOLOGY EXAM  Never done   URINE MICROALBUMIN  Never done   TETANUS/TDAP  Never done   COVID-19 Vaccine (4 - Booster for Pfizer series) 06/16/2020   INFLUENZA VACCINE  09/27/2020    Past Medical History:  Diagnosis Date   Acute conjunctivitis 02/12/2014   Acute gastroenteritis 08/12/2015   ANXIETY 03/02/2006   Qualifier: Diagnosis of  By: [name] MD, [name]     Bronchitis 01/26/2011   CAP (community acquired pneumonia)    CHICKENPOX, HX OF 06/20/2006   Annotation: age 76 Qualifier: Diagnosis of  By: [name] MD, [name]     Chlamydia infection 03/29/2018   COUGH, CHRONIC 12/23/2008   Qualifier: Diagnosis of  By: [name] MD, [name]     Deaf    DEAFNESS, 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   Qualifier: Diagnosis of  By: [name] MD, [place]     DEPRESSION 03/02/2006   Qualifier: Diagnosis of  By: [name] MD, [name]     diabetes type 2 08/18/2020   Dyslipidemia 07/02/2012   ERECTILE DYSFUNCTION, 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   Qualifier: Diagnosis  of  By: [name] MD, [name]     FACIAL RASH 11/17/2008   Qualifier: Diagnosis of  By: [name] MD, [name]     GERD 07/20/2009   Qualifier: Diagnosis of  By: [name] MD, [name]     GERD (gastroesophageal reflux disease)    HIV disease ([place])    Obesity (BMI 30.0-34.9) 04/20/2016   PEDICULOSIS PUBIS (PUBIC LOUSE) 06/20/2006   Annotation: 4/06 Qualifier: Diagnosis of  By: [name] MD, [name]     Poor dentition 09/21/2016   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, 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 Qualifier: Diagnosis of  By: [name] MD, [name]     Pulmonary nodule    SINUSITIS, CHRONIC 02/18/2009   Qualifier: Diagnosis of  By: Tommy Medal MD, [name]     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   Ulcerative proctosigmoiditis, 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   Urinary frequency 04/06/2009   Qualifier: Diagnosis of  By: [name] MD, [name]      Past Surgical History:  Procedure Laterality Date   colonoscopy     THROAT SURGERY     UMBILICAL HERNIA REPAIR      Family History  Problem Relation Age of Onset   Headache Mother    Hypertension Mother    Heart attack Father 10   Sudden Cardiac Death Neg Hx    Colon cancer Neg Hx    Liver  disease Neg Hx    Esophageal cancer Neg Hx    Rectal cancer Neg Hx    Stomach cancer Neg Hx     Social History   Socioeconomic History   Marital status: Single    Spouse name: Not on file   Number of children: 0   Years of education: Not on file   Highest education level: Not on file  Occupational History   Not on file  Tobacco Use   Smoking status: Never   Smokeless tobacco: Never  Vaping Use   Vaping Use: Never used  Substance and Sexual Activity   Alcohol use: No    Alcohol/week: 0.0 standard drinks   Drug use: No   Sexual activity: Not Currently    Partners: Male  Other Topics Concern   Not on file  Social History Narrative   Lives alone   Mom lives with his sister in [place]   No children   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 30 tablet 9   dorzolamide (TRUSOPT) 2 % ophthalmic solution Apply to eye.     acyclovir (ZOVIRAX) 800 MG tablet Take by mouth. (Patient not taking: Reported on 10/27/2020)     azaTHIOprine (IMURAN) 50 MG tablet Take by mouth. (Patient not taking: Reported on 10/27/2020)     brimonidine (ALPHAGAN) 0.15 % ophthalmic solution Apply to eye. (Patient not taking: Reported on 10/27/2020)     clobetasol ointment (TEMOVATE) 7.85 % Apply 1 application topically 2 (two) times daily. (Patient not taking: Reported on 8/85/0277) 30 g 0   folic acid (FOLVITE) 1 MG tablet Take 1 tablet by mouth daily. (Patient not taking: Reported on 10/27/2020)     hydrOXYzine (ATARAX/VISTARIL) 25 MG tablet Take 1 tablet (25 mg total) by mouth 3 (three) times daily as needed for itching (will cause drowsiness best to use at bedtime). (Patient not taking: Reported on 10/27/2020) 30 tablet 0   methotrexate (RHEUMATREX) 2.5 MG tablet Take by mouth. (Patient not taking: Reported on 10/27/2020)     timolol (TIMOPTIC-XR) 0.25 % ophthalmic gel-forming Apply to eye. (Patient not taking: Reported on 10/27/2020)     traZODone (DESYREL) 50 MG tablet 1/2- 1 tablet by mouth at bedtime as needed for sleep (Patient not taking: Reported on 10/27/2020) 30 tablet 2   No facility-administered medications prior to visit.    No Known Allergies  Review of Systems  Gastrointestinal:  Positive for blood in stool (When stool is hard) and constipation.       (+)pain form  hemorrhoids      Objective:    Physical Exam Constitutional:      General: He is not in acute distress.    Appearance: Normal appearance. He is not ill-appearing.  HENT:     Head: Normocephalic and atraumatic.     Right Ear: External ear normal.     Left Ear: External ear normal.  Eyes:     Extraocular Movements: Extraocular movements intact.  Pupils: Pupils are equal, round, and reactive to light.  Genitourinary:    Rectum: Tenderness (on external hemorrhoid) and external hemorrhoid (near anus) present.     Comments: Small external hemorrhoids near anus Hypo-pigmentation of some skin near anus Skin:    General: Skin is warm and dry.  Neurological:     Mental Status: He is alert and oriented to person, place, and time.  Psychiatric:        Behavior: Behavior normal.    BP 137/80 (BP Location: Right Arm, Patient Position: Sitting, Cuff Size: Large)   Pulse 65   Temp 98.1 F (36.7 C) (Oral)   Resp 16   Wt 243 lb (110.2 kg)   SpO2 100%   BMI 36.95 kg/m  Wt Readings from Last 3 Encounters:  11/29/20 243 lb (110.2 kg)  10/22/20 242 lb 6.4 oz (110 kg)  10/19/20 234 lb (106.1 kg)       Assessment & Plan:   Problem List Items Addressed This Visit       Unprioritized   Rectal bleeding    ? Due to internal hemorrhoids versus known hx of ulcerative proctosigmoiditis. I saw that he had an incomplete colonoscopy due to poor prep. Will refer back to GI for further evaluation.      Relevant Orders   Ambulatory referral to Gastroenterology   Hemorrhoids - Primary    New. Pt is advised as follows:  Apply preparation H rectally twice daily as needed. You can sprinkle epsom salts in a warm tub and soak twice daily as needed for comfort. Keep stools soft by eating lots of fresh fruits/veggies and drinking plenty of water. You can add a fiber supplement over the counter if needed.   You should be contacted about scheduling your appointment with the GI doctor.        Relevant Orders   Ambulatory referral to Gastroenterology   20 minutes spent on today's visit. The majority of the time was spent on counseling.   No orders of the defined types were placed in this encounter.   I, [name] NP, personally preformed the services described in this documentation.  All medical record entries made by the scribe were at my direction and in my presence.  I have reviewed the chart and discharge instructions (if applicable) and agree that the record reflects my personal performance and is accurate and complete. 11/29/2020   I,Matthew Fitzpatrick,acting as a Education administrator for Nance Pear, NP.,have documented all relevant documentation on the behalf of Nance Pear, NP,as directed by  Nance Pear, NP while in the presence of Nance Pear, NP.   Nance Pear, NP

## 2020-11-29 NOTE — Assessment & Plan Note (Signed)
New. Pt is advised as follows:  Apply preparation H rectally twice daily as needed. You can sprinkle epsom salts in a warm tub and soak twice daily as needed for comfort. Keep stools soft by eating lots of fresh fruits/veggies and drinking plenty of water. You can add a fiber supplement over the counter if needed.   You should be contacted about scheduling your appointment with the GI doctor.

## 2020-12-14 ENCOUNTER — Encounter: Payer: Self-pay | Admitting: Internal Medicine

## 2020-12-14 ENCOUNTER — Ambulatory Visit (INDEPENDENT_AMBULATORY_CARE_PROVIDER_SITE_OTHER): Payer: Medicare Other | Admitting: Internal Medicine

## 2020-12-14 ENCOUNTER — Other Ambulatory Visit: Payer: Self-pay

## 2020-12-14 VITALS — BP 129/83 | HR 69 | Temp 97.7°F | Wt 238.0 lb

## 2020-12-14 DIAGNOSIS — B04 Monkeypox: Secondary | ICD-10-CM | POA: Diagnosis not present

## 2020-12-14 DIAGNOSIS — B20 Human immunodeficiency virus [HIV] disease: Secondary | ICD-10-CM | POA: Diagnosis not present

## 2020-12-14 DIAGNOSIS — Z23 Encounter for immunization: Secondary | ICD-10-CM | POA: Diagnosis not present

## 2020-12-14 DIAGNOSIS — F411 Generalized anxiety disorder: Secondary | ICD-10-CM | POA: Diagnosis not present

## 2020-12-14 MED ORDER — BIKTARVY 50-200-25 MG PO TABS: 1.0000 | ORAL_TABLET | Freq: Every day | ORAL | 11 refills | Status: DC

## 2020-12-14 NOTE — Assessment & Plan Note (Signed)
His proctitis is slowly resolving after treatment.

## 2020-12-14 NOTE — Progress Notes (Signed)
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    Priority: 1.   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/21/2020    Priority: 1.   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    Priority: 1.   Chlamydia infection 03/29/2018    Priority: 1.   Human immunodeficiency virus (HIV) disease ([place]) 03/02/2006    Priority: 1.   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  Modified Medications   Modified Medication Previous Medication   BICTEGRAVIR-EMTRICITABINE-TENOFOVIR AF (BIKTARVY) 50-200-25 MG TABS TABLET bictegravir-emtricitabine-tenofovir AF (BIKTARVY) 50-200-25 MG TABS tablet      Take 1 tablet by mouth daily.    Take 1 tablet by mouth daily.  Discontinued Medications   No medications on file    Subjective: Matthew Fitzpatrick is in for his routine HIV follow-up visit.  He says that he has not had any problems obtaining his Biktarvy from [employer].  He says that he misses about 2 doses each month.  He says that he would like [facility] or send him 33 tablets each month instead of 30.  He says if he drops pill on the ground he does not take it and  throws it away.  He completed therapy wit TPOXX 1 month ago for monkey pox.  His rectal pain and intermittent hematochezia continue to improve.  He says that he has not been sexually active since he was treated.  He denies feeling anxious or depressed.  Review of Systems: Review of Systems  Constitutional:  Negative for chills, diaphoresis, fever, malaise/fatigue and weight loss.  HENT:  Negative for sore throat.   Respiratory:  Negative for cough, sputum production and shortness of breath.   Cardiovascular:  Negative for chest pain.  Gastrointestinal:  Positive for blood in stool. Negative for abdominal pain, diarrhea, heartburn, nausea and vomiting.  Genitourinary:  Negative for dysuria and frequency.  Musculoskeletal:  Negative for joint pain and myalgias.  Skin:  Negative for rash.  Neurological:  Negative for dizziness and headaches.  Psychiatric/Behavioral:  Negative for depression and substance abuse. The patient is not nervous/anxious.    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   Qualifier: Diagnosis of  By: [name] MD, Zalia Hautala     DEPRESSION 03/02/2006   Qualifier: Diagnosis of  By: [name] MD, Emmagrace Runkel     diabetes type 2 08/18/2020   Dyslipidemia 07/02/2012   ERECTILE DYSFUNCTION, 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   Qualifier: Diagnosis of  By: [name] MD, Yashika Mask     FACIAL RASH 11/17/2008   Qualifier: Diagnosis of  By: [name] MD, Alyxander Kollmann     GERD 07/20/2009   Qualifier: Diagnosis of  By: [name] MD, Asaad Gulley     GERD (gastroesophageal reflux  disease)    HIV disease ([place])    Obesity (BMI 30.0-34.9) 04/20/2016   PEDICULOSIS PUBIS (PUBIC LOUSE) 06/20/2006   Annotation: 40 Qualifier: Diagnosis of  By: [name] MD, Kaesen Rodriguez     Poor dentition 09/21/2016   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, 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 Qualifier: Diagnosis of  By: [name] MD, Gohan Collister     Pulmonary nodule    SINUSITIS, CHRONIC 02/18/2009   Qualifier: Diagnosis of  By: Tommy Medal MD, [name]     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   Ulcerative proctosigmoiditis, 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   Urinary frequency 04/06/2009   Qualifier: Diagnosis of  By: [name] MD, Kahli Fitzgerald      Social History   Tobacco Use   Smoking status: Never   Smokeless tobacco: Never  Vaping Use   Vaping Use: Never used  Substance Use Topics   Alcohol use: No    Alcohol/week: 0.0 standard drinks   Drug use: No    Family History  Problem Relation Age of Onset   Headache Mother    Hypertension Mother    Heart attack Father 56   Sudden Cardiac Death Neg Hx    Colon cancer Neg Hx    Liver disease Neg Hx    Esophageal cancer Neg Hx    Rectal cancer Neg Hx    Stomach cancer Neg Hx     No Known Allergies  Health Maintenance  Topic Date Due   FOOT EXAM  Never done   OPHTHALMOLOGY EXAM  Never done   URINE MICROALBUMIN  Never done   TETANUS/TDAP  Never done   COVID-19 Vaccine (4 - Booster for Pfizer series) 06/16/2020   INFLUENZA VACCINE  09/27/2020   HEMOGLOBIN A1C  02/17/2021   Hepatitis C Screening  Completed   HIV Screening  Completed   HPV VACCINES  Aged Out    Objective:  Vitals:   12/14/20 1413  BP: 129/83  Pulse: 69  Temp: 97.7 F (36.5 C)  TempSrc: Temporal  Weight: 238 lb (108 kg)   Body mass index is 36.19 kg/m.  Physical Exam Constitutional:      Comments: He was examined with the aid of the American sign language interpreter.  He appears to be in good spirits.  HENT:     Mouth/Throat:     Pharynx: No oropharyngeal exudate.  Eyes:      Conjunctiva/sclera: Conjunctivae normal.  Cardiovascular:     Rate and Rhythm: Normal rate and regular rhythm.     Heart sounds: No murmur heard. Pulmonary:     Effort: Pulmonary effort is normal.     Breath sounds: Normal breath sounds.  Abdominal:     Palpations: Abdomen is soft. There is no mass.     Tenderness: There is no abdominal tenderness.  Musculoskeletal:        General: Normal range of motion.  Skin:    Findings: No rash.  Neurological:  Mental Status: He is alert and oriented to person, place, and time.  Psychiatric:        Mood and Affect: Mood normal.    Lab Results Lab Results  Component Value Date   WBC 9.3 10/19/2020   HGB 14.2 10/19/2020   HCT 41.8 10/19/2020   MCV 90.5 10/19/2020   PLT 277 10/19/2020    Lab Results  Component Value Date   CREATININE 1.05 10/19/2020   BUN 9 10/19/2020   NA 136 10/19/2020   K 3.6 10/19/2020   CL 98 10/19/2020   CO2 29 10/19/2020    Lab Results  Component Value Date   ALT 22 10/19/2020   AST 21 10/19/2020   ALKPHOS 86 10/19/2020   BILITOT 0.2 (L) 10/19/2020    Lab Results  Component Value Date   CHOL 218 (H) 03/02/2016   HDL 45 03/02/2016   LDLCALC 145 (H) 03/02/2016   TRIG 141 03/02/2016   CHOLHDL 4.8 03/02/2016   Lab Results  Component Value Date   LABRPR Reactive (A) 10/19/2020   RPRTITER 1:32 (H) 04/07/2020   HIV 1 RNA Quant (Copies/mL)  Date Value  10/05/2020 55 (H)  04/07/2020 150 (H)  10/02/2019 <20   CD4 T Cell Abs (/uL)  Date Value  10/05/2020 1,214  04/07/2020 1,054  10/02/2019 1,063     Problem List Items Addressed This Visit       1.   Human immunodeficiency virus (HIV) disease ([place])    His infection has been under good, long-term control.  I reminded him to call his pharmacy far enough in advance so that he does not run short with his Biktarvy.      Relevant Medications   bictegravir-emtricitabine-tenofovir AF (BIKTARVY) 50-200-25 MG TABS tablet   Human monkeypox    His  proctitis is slowly resolving after treatment.      Relevant Medications   bictegravir-emtricitabine-tenofovir AF (BIKTARVY) 50-200-25 MG TABS tablet     Unprioritized   Anxiety state    His anxiety and depression are currently in remission.         Michel Bickers, MD [facility] [phone] pager   [phone] cell 12/14/2020, 2:33 PM

## 2020-12-14 NOTE — Assessment & Plan Note (Signed)
His anxiety and depression are currently in remission.

## 2020-12-14 NOTE — Assessment & Plan Note (Signed)
His infection has been under good, long-term control.  I reminded him to call his pharmacy far enough in advance so that he does not run short with his Biktarvy.

## 2020-12-20 ENCOUNTER — Other Ambulatory Visit: Payer: Self-pay

## 2020-12-20 DIAGNOSIS — B20 Human immunodeficiency virus [HIV] disease: Secondary | ICD-10-CM

## 2020-12-20 MED ORDER — BIKTARVY 50-200-25 MG PO TABS: 1.0000 | ORAL_TABLET | Freq: Every day | ORAL | 11 refills | Status: DC

## 2020-12-28 ENCOUNTER — Ambulatory Visit: Payer: Medicare Other | Admitting: Medical

## 2020-12-28 NOTE — Progress Notes (Deleted)
Cardiology Office Note   Date:  12/28/2020   ID:  [name], DOB Dec 18, 1980, MRN [id number]  PCP:  [name], NP  Cardiologist:  Elouise Munroe, MD EP: None  No chief complaint on file.     History of Present Illness: Matthew Fitzpatrick is a 40 y.o. male with PMH of HLD, HIV, and congenital deafness, who presents for evaluation of palpitations.  He was last evaluated by cardiology and an outpatient visit with Dr. Margaretann Loveless 04/2019 at which time he was doing well from a cardiac standpoint without any complaints.  His last ischemic evaluation was a coronary CTA in 2019 which showed normal coronaries and a calcium score of 0.  His last echocardiogram in 2018 showed normal LV function without RWMA, and no significant valvular dysfunction.  Since his last visit he was diagnosed with monkey pox 09/2020 for which she completed a course of Tpoxx and was cleared by infectious disease outpatient.  Patient presents with ASL interpreter for evaluation of palpitations.  1. Palpitations:  - Will arrange a ***  2. HLD: LDL *** - Continue ***  3. Atypical chest pain: Reassuring ischemic evaluation in 2019 with coronary CTA which showed no evidence of CAD and calcium score 0. -Continue risk factor modifications for goal BP <130/80, LDL <100, and A1c <7  4. HIV: Followed by infectious disease outpatient, on Biktarvy.  Completed a course of TPOXX for management of monkey pox diagnosed 09/2020  Past Medical History:  Diagnosis Date   Acute conjunctivitis 02/12/2014   Acute gastroenteritis 08/12/2015   ANXIETY 03/02/2006   Qualifier: Diagnosis of  By: [name] MD, [name]     Bronchitis 01/26/2011   CAP (community acquired pneumonia)    CHICKENPOX, HX OF 06/20/2006   Annotation: age 68 Qualifier: Diagnosis of  By: [name] MD, [name]     Chlamydia infection 03/29/2018   COUGH, CHRONIC 12/23/2008   Qualifier: Diagnosis of  By: [name] MD, [name]     Deaf    DEAFNESS,  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   Qualifier: Diagnosis of  By: [name] MD, [name]     DEPRESSION 03/02/2006   Qualifier: Diagnosis of  By: [name] MD, [name]     diabetes type 2 08/18/2020   Dyslipidemia 07/02/2012   ERECTILE DYSFUNCTION, 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   Qualifier: Diagnosis of  By: [name] MD, [name]     FACIAL RASH 11/17/2008   Qualifier: Diagnosis of  By: [name] MD, [name]     GERD 07/20/2009   Qualifier: Diagnosis of  By: [name] MD, [name]     GERD (gastroesophageal reflux disease)    HIV disease ([place])    Obesity (BMI 30.0-34.9) 04/20/2016   PEDICULOSIS PUBIS (PUBIC LOUSE) 06/20/2006   Annotation: 4/06 Qualifier: Diagnosis of  By: [name] MD, [name]     Poor dentition 09/21/2016   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, 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 Qualifier: Diagnosis of  By: [name] MD, [name]     Pulmonary nodule    SINUSITIS, CHRONIC 02/18/2009   Qualifier: Diagnosis of  By: Tommy Medal MD, [name]     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   Ulcerative proctosigmoiditis, 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 30 tablet 11   dorzolamide (TRUSOPT) 2 % ophthalmic solution Apply to eye. (Patient not taking: Reported on 12/14/2020)     No current facility-administered medications for this visit.    Allergies:   Patient has no known allergies.    Social History:  The patient  reports that he has never smoked. He has never used smokeless tobacco. He reports that he does not drink alcohol and does not use drugs.   Family History:  The patient's ***family history includes Headache in his mother; Heart attack (age of onset: 76) in his father;  Hypertension in his mother.    ROS:  Please see the history of present illness.   Otherwise, review of systems are positive for {NONE DEFAULTED:18576}.   All other systems are reviewed and negative.    PHYSICAL EXAM: VS:  There were no vitals taken for this visit. , BMI There is no height or weight on file to calculate BMI. GEN: Well nourished, well developed, in no acute distress HEENT: normal Neck: no JVD, carotid bruits, or masses Cardiac: ***RRR; no murmurs, rubs, or gallops,no edema  Respiratory:  clear to auscultation bilaterally, normal work of breathing GI: soft, nontender, nondistended, + BS MS: no deformity or atrophy Skin: warm and dry, no rash Neuro:  Strength and sensation are intact Psych: euthymic mood, full affect   EKG:  EKG {ACTION; IS/IS WIO:03559741} ordered today. The ekg ordered today demonstrates ***   Recent Labs: 08/17/2020: TSH 1.10 10/19/2020: ALT 22; BUN 9; Creatinine, Ser 1.05; Hemoglobin 14.2; Platelets 277; Potassium 3.6; Sodium 136    Lipid Panel    Component Value Date/Time   CHOL 218 (H) 03/02/2016 1628   TRIG 141 03/02/2016 1628   HDL 45 03/02/2016 1628   CHOLHDL 4.8 03/02/2016 1628   VLDL 28 03/02/2016 1628   LDLCALC 145 (H) 03/02/2016 1628      Wt Readings from Last 3 Encounters:  12/14/20 238 lb (108 kg)  11/29/20 243 lb (110.2 kg)  10/22/20 242 lb 6.4 oz (110 kg)      Other studies Reviewed: Additional studies/ records that were reviewed today include:   Echocardiogram 2018: - Left ventricle: The cavity size was normal. Systolic function was    vigorous. The estimated ejection fraction was in the range of 65%    to 70%. Wall motion was normal; there were no regional wall    motion abnormalities. There was no evidence of elevated    ventricular filling pressure by Doppler parameters.  - Mitral valve: Calcified annulus. Mildly thickened leaflets .  - Tricuspid valve: There was trivial regurgitation.   Coronary CTA  2019: IMPRESSION: 1. Calcium score 0   2.  Normal right dominant coronary arteries   3.  Normal aortic root 3.2 cm   ASSESSMENT AND PLAN:  1.  ***   Current medicines are reviewed at length with the patient today.  The patient {ACTIONS; HAS/DOES NOT HAVE:19233} concerns regarding medicines.  The following changes have been made:  {PLAN; NO CHANGE:13088:s}  Labs/ tests ordered today include: *** No orders of the defined types were placed in this encounter.    Disposition:   FU with *** in {gen number 6-38:453646} {Days to years:10300}  Signed, Abigail Butts, PA-C  12/28/2020 5:30 AM

## 2021-01-03 ENCOUNTER — Encounter: Payer: Self-pay | Admitting: Internal Medicine

## 2021-01-10 ENCOUNTER — Encounter: Payer: Self-pay | Admitting: Internal Medicine

## 2021-02-09 ENCOUNTER — Telehealth: Payer: Self-pay | Admitting: Family

## 2021-02-09 NOTE — Telephone Encounter (Signed)
Pt called and is needing information regarding who his audiologist is. He lost their information and is needing to contact them.

## 2021-03-18 ENCOUNTER — Ambulatory Visit: Payer: Medicare Other | Admitting: Family

## 2021-03-21 ENCOUNTER — Ambulatory Visit (INDEPENDENT_AMBULATORY_CARE_PROVIDER_SITE_OTHER): Payer: Medicare Other | Admitting: Family

## 2021-03-21 VITALS — BP 131/81 | HR 73 | Temp 98.5°F | Resp 16 | Wt 238.0 lb

## 2021-03-21 DIAGNOSIS — K219 Gastro-esophageal reflux disease without esophagitis: Secondary | ICD-10-CM

## 2021-03-21 DIAGNOSIS — R1013 Epigastric pain: Secondary | ICD-10-CM | POA: Diagnosis not present

## 2021-03-21 LAB — CBC WITH DIFFERENTIAL/PLATELET
Basophils Absolute: 0 10*3/uL (ref 0.0–0.1)
Basophils Relative: 0.2 % (ref 0.0–3.0)
Eosinophils Absolute: 0.1 10*3/uL (ref 0.0–0.7)
Eosinophils Relative: 1.4 % (ref 0.0–5.0)
HCT: 43.4 % (ref 39.0–52.0)
Hemoglobin: 14.6 g/dL (ref 13.0–17.0)
Lymphocytes Relative: 58 % — ABNORMAL HIGH (ref 12.0–46.0)
Lymphs Abs: 4.5 10*3/uL — ABNORMAL HIGH (ref 0.7–4.0)
MCHC: 33.6 g/dL (ref 30.0–36.0)
MCV: 93.4 fl (ref 78.0–100.0)
Monocytes Absolute: 0.8 10*3/uL (ref 0.1–1.0)
Monocytes Relative: 10.7 % (ref 3.0–12.0)
Neutro Abs: 2.3 10*3/uL (ref 1.4–7.7)
Neutrophils Relative %: 29.7 % — ABNORMAL LOW (ref 43.0–77.0)
Platelets: 292 10*3/uL (ref 150.0–400.0)
RBC: 4.64 Mil/uL (ref 4.22–5.81)
RDW: 13.2 % (ref 11.5–15.5)
WBC: 7.8 10*3/uL (ref 4.0–10.5)

## 2021-03-21 LAB — COMPREHENSIVE METABOLIC PANEL
ALT: 21 U/L (ref 0–53)
AST: 18 U/L (ref 0–37)
Albumin: 4.2 g/dL (ref 3.5–5.2)
Alkaline Phosphatase: 102 U/L (ref 39–117)
BUN: 9 mg/dL (ref 6–23)
CO2: 29 mEq/L (ref 19–32)
Calcium: 9.5 mg/dL (ref 8.4–10.5)
Chloride: 101 mEq/L (ref 96–112)
Creatinine, Ser: 0.93 mg/dL (ref 0.40–1.50)
GFR: 102.07 mL/min (ref >60.00)
Glucose, Bld: 75 mg/dL (ref 70–99)
Potassium: 3.9 mEq/L (ref 3.5–5.1)
Sodium: 137 mEq/L (ref 135–145)
Total Bilirubin: 0.4 mg/dL (ref 0.2–1.2)
Total Protein: 7.9 g/dL (ref 6.0–8.3)

## 2021-03-21 LAB — LIPASE: Lipase: 34 U/L (ref 11.0–59.0)

## 2021-03-21 MED ORDER — PANTOPRAZOLE SODIUM 40 MG PO TBEC: 40.0000 mg | DELAYED_RELEASE_TABLET | Freq: Every day | ORAL | 3 refills | Status: DC

## 2021-03-21 NOTE — Assessment & Plan Note (Signed)
New. Recommended trial of protonix. Pt given handout on gerd diet.

## 2021-03-21 NOTE — Patient Instructions (Signed)
Please complete lab work prior to leaving. Start protonix (acid medicine).

## 2021-03-21 NOTE — Progress Notes (Signed)
Subjective:     Patient ID: Matthew Fitzpatrick, male    DOB: [birth year], 41 y.o.   MRN: [id number]  Chief Complaint  Patient presents with   Insomnia    Still having trouble staying sleep   Gastroesophageal Reflux    Complains of symptoms of acid reflux since indigestion episode in December.     Insomnia  Gastroesophageal Reflux  Patient is in today for follow up. He is accompanied by a sign language interpretor.   Reports that he has had pain in his chest- feels like acid.  He has tried TUMs which helps only a small amount.  He is eating ok.  Bowel movents normal color, no blood, no black stools.  He reports some abdominal bloating.   Wt Readings from Last 3 Encounters:  03/21/21 238 lb (108 kg)  12/14/20 238 lb (108 kg)  11/29/20 243 lb (110.2 kg)     Health Maintenance Due  Topic Date Due   FOOT EXAM  Never done   OPHTHALMOLOGY EXAM  Never done   URINE MICROALBUMIN  Never done   TETANUS/TDAP  Never done   COVID-19 Vaccine (4 - Booster for Pfizer series) 05/19/2020   HEMOGLOBIN A1C  02/17/2021    Past Medical History:  Diagnosis Date   Acute conjunctivitis 02/12/2014   Acute gastroenteritis 08/12/2015   ANXIETY 03/02/2006   Qualifier: Diagnosis of  By: [name] MD, [name]     Bronchitis 01/26/2011   CAP (community acquired pneumonia)    CHICKENPOX, HX OF 06/20/2006   Annotation: age 53 Qualifier: Diagnosis of  By: [name] MD, [name]     Chlamydia infection 03/29/2018   COUGH, CHRONIC 12/23/2008   Qualifier: Diagnosis of  By: [name] MD, [name]     Deaf    DEAFNESS, 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   Qualifier: Diagnosis of  By: [name] MD, [place]     DEPRESSION 03/02/2006   Qualifier: Diagnosis of  By: [name] MD, [name]     diabetes type 2 08/18/2020   Dyslipidemia 07/02/2012   ERECTILE DYSFUNCTION, 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   Qualifier: Diagnosis of  By: [name] MD, [name]     FACIAL RASH 11/17/2008   Qualifier: Diagnosis of  By: [name] MD, [name]     GERD 07/20/2009    Qualifier: Diagnosis of  By: [name] MD, [name]     GERD (gastroesophageal reflux disease)    HIV disease ([place])    Obesity (BMI 30.0-34.9) 04/20/2016   PEDICULOSIS PUBIS (PUBIC LOUSE) 06/20/2006   Annotation: 4/06 Qualifier: Diagnosis of  By: [name] MD, [name]     Poor dentition 09/21/2016   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, 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 Qualifier: Diagnosis of  By: [name] MD, [name]     Pulmonary nodule    SINUSITIS, CHRONIC 02/18/2009   Qualifier: Diagnosis of  By: Tommy Medal MD, [name]     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   Ulcerative proctosigmoiditis, 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   Urinary frequency 04/06/2009   Qualifier: Diagnosis of  By: [name] MD, [name]      Past Surgical History:  Procedure Laterality Date   colonoscopy     THROAT SURGERY     UMBILICAL HERNIA REPAIR      Family History  Problem Relation Age of Onset   Headache Mother    Hypertension Mother    Heart attack Father 67   Sudden Cardiac Death Neg Hx    Colon cancer Neg Hx    Liver disease Neg Hx    Esophageal cancer Neg  Hx    Rectal cancer Neg Hx    Stomach cancer Neg Hx     Social History   Socioeconomic History   Marital status: Single    Spouse name: Not on file   Number of children: 0   Years of education: Not on file   Highest education level: Not on file  Occupational History   Not on file  Tobacco Use   Smoking status: Never   Smokeless tobacco: Never  Vaping Use   Vaping Use: Never used  Substance and Sexual Activity   Alcohol use: No    Alcohol/week: 0.0 standard drinks   Drug use: No   Sexual activity: Not Currently    Partners: Male    Comment: declined condoms  Other Topics Concern   Not on file  Social History Narrative   Lives alone   Mom lives with his sister in [place]   No children   Works in Pensions consultant in [place]   Enjoys working on Public affairs consultant games   No pets   Social Determinants of Radio broadcast assistant Strain:  Not on file  Food Insecurity: Not on file  Transportation Needs: Not on file  Physical Activity: Not on file  Stress: Not on file  Social Connections: Not on file  Intimate Partner Violence: Not on file    Outpatient Medications Prior to Visit  Medication Sig Dispense Refill   bictegravir-emtricitabine-tenofovir AF (BIKTARVY) 50-200-25 MG TABS tablet Take 1 tablet by mouth daily. 30 tablet 11   dorzolamide (TRUSOPT) 2 % ophthalmic solution Apply to eye. (Patient not taking: Reported on 12/14/2020)     No facility-administered medications prior to visit.    No Known Allergies  Review of Systems  Psychiatric/Behavioral:  The patient has insomnia.       Objective:    Physical Exam Constitutional:      General: He is not in acute distress.    Appearance: He is well-developed.  HENT:     Head: Normocephalic and atraumatic.  Cardiovascular:     Rate and Rhythm: Normal rate and regular rhythm.     Heart sounds: No murmur heard. Pulmonary:     Effort: Pulmonary effort is normal. No respiratory distress.     Breath sounds: Normal breath sounds. No wheezing or rales.  Abdominal:     General: Abdomen is protuberant. Bowel sounds are increased.     Palpations: Abdomen is soft.     Tenderness: There is abdominal tenderness in the epigastric area.     Comments: Mild epigastric tenderness without guarding  Skin:    General: Skin is warm and dry.  Neurological:     Mental Status: He is alert and oriented to person, place, and time.  Psychiatric:        Behavior: Behavior normal.        Thought Content: Thought content normal.    BP 131/81 (BP Location: Right Arm, Patient Position: Sitting, Cuff Size: Large)    Pulse 73    Temp 98.5 F (36.9 C) (Oral)    Resp 16    Wt 238 lb (108 kg)    SpO2 98%    BMI 36.19 kg/m  Wt Readings from Last 3 Encounters:  03/21/21 238 lb (108 kg)  12/14/20 238 lb (108 kg)  11/29/20 243 lb (110.2 kg)       Assessment & Plan:   Problem List  Items Addressed This Visit       Unprioritized  GERD (gastroesophageal reflux disease) - Primary    New. Recommended trial of protonix. Pt given handout on gerd diet.       Relevant Medications   pantoprazole (PROTONIX) 40 MG tablet   Other Relevant Orders   Comp Met (CMET)   Lipase   Epigastric pain    Differential includes H pylori, PUD, pancreatitis, cholecystitis.  Plan as below.       Relevant Orders   Ambulatory referral to Gastroenterology   US Abdomen Complete   CBC with Differential/Platelet   H. pylori breath test    I have discontinued Keric L. Bound "[name]"'s dorzolamide. I am also having him start on pantoprazole. Additionally, I am having him maintain his Biktarvy.  Meds ordered this encounter  Medications   pantoprazole (PROTONIX) 40 MG tablet    Sig: Take 1 tablet (40 mg total) by mouth daily.    Dispense:  30 tablet    Refill:  3    Order Specific Question:   Supervising Provider    Answer:   Penni Homans A [[id number]]

## 2021-03-21 NOTE — Assessment & Plan Note (Signed)
Differential includes H pylori, PUD, pancreatitis, cholecystitis.  Plan as below.

## 2021-03-22 DIAGNOSIS — H2513 Age-related nuclear cataract, bilateral: Secondary | ICD-10-CM | POA: Diagnosis not present

## 2021-03-22 DIAGNOSIS — H35413 Lattice degeneration of retina, bilateral: Secondary | ICD-10-CM | POA: Diagnosis not present

## 2021-03-22 DIAGNOSIS — H209 Unspecified iridocyclitis: Secondary | ICD-10-CM | POA: Diagnosis not present

## 2021-03-23 ENCOUNTER — Encounter: Payer: Self-pay | Admitting: Family

## 2021-03-23 LAB — H. PYLORI BREATH TEST

## 2021-04-18 ENCOUNTER — Ambulatory Visit (INDEPENDENT_AMBULATORY_CARE_PROVIDER_SITE_OTHER): Payer: Medicare Other | Admitting: Internal Medicine

## 2021-04-18 ENCOUNTER — Encounter: Payer: Self-pay | Admitting: Internal Medicine

## 2021-04-18 VITALS — BP 142/88 | HR 100 | Ht 68.0 in | Wt 248.0 lb

## 2021-04-18 DIAGNOSIS — R1110 Vomiting, unspecified: Secondary | ICD-10-CM

## 2021-04-18 DIAGNOSIS — K5909 Other constipation: Secondary | ICD-10-CM | POA: Diagnosis not present

## 2021-04-18 DIAGNOSIS — R12 Heartburn: Secondary | ICD-10-CM | POA: Diagnosis not present

## 2021-04-18 DIAGNOSIS — R140 Abdominal distension (gaseous): Secondary | ICD-10-CM | POA: Diagnosis not present

## 2021-04-18 NOTE — Patient Instructions (Addendum)
Purchase Miralax over the counter.   Dissolve 1 capful in at least 8 ounces of water daily.   Take Dulcolax (52m )- 1 tablet by mouth EVERY other night at bedtime.   You have been scheduled for an endoscopy. Please follow written instructions given to you at your visit today. If you use inhalers (even only as needed), please bring them with you on the day of your procedure.  If you are age 7063or older, your body mass index should be between 23-30. Your Body mass index is 37.71 kg/m. If this is out of the aforementioned range listed, please consider follow up with your Primary Care Provider.  If you are age 7050or younger, your body mass index should be between 19-25. Your Body mass index is 37.71 kg/m. If this is out of the aformentioned range listed, please consider follow up with your Primary Care Provider.   ________________________________________________________  The [facility] providers would like to encourage you to use [facility] communicate with providers for non-urgent requests or questions.  Due to long hold times on the telephone, sending your provider a message by [facility] be a faster and more efficient way to get a response.  Please allow 48 business hours for a response.  Please remember that this is for non-urgent requests.  _______________________________________________________  Thank you for choosing me and [facility].  Dr. CSilvano Rusk

## 2021-04-18 NOTE — Progress Notes (Signed)
Matthew Fitzpatrick 41 y.o. 01-26-1981 [id number]  Assessment & Plan:   Encounter Diagnoses  Name Primary?   Heartburn Yes   Regurgitation of food    Other constipation    Bloating     Evaluate upper GI symptoms with EGD.  Continue PPI. The risks and benefits as well as alternatives of endoscopic procedure(s) have been discussed and reviewed. All questions answered. The patient agrees to proceed.   Treat constipation with daily MiraLAX and every other night Dulcolax 5 mg p.o.  Further plans pending results of the above.  Note that colonoscopy was not adequate for screening so a screening colonoscopy should be undertaken at the appropriate age if not done sooner.  I do not think his symptoms warrant a colonoscopy at this point though they could depending clinical course.  It is not unreasonable to proceed with abdominal ultrasound if that is still intended though his symptoms sound more luminal to me.  I appreciate the opportunity to care for this patient. 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  History is taken with the help of a sign language interpreter.  For several months now has developed progressive worsening constipation.  Gets abdominal distention bloating and has difficulty breathing.  Also is having increasing heartburn and several times a week will regurgitate fluid or food.  There is no dysphagia.  Strains to stool.  There is rare slight rectal bleeding with hard stools.  Has been using Dulcolax suppositories and then added some over-the-counter laxative to that and finally did have a decent bowel movement yesterday and today.  Had monkeypox last summer and had a lot of diarrhea and some bleeding with that but no constipation  with that.  Has mild anal discomfort at times but no severe rectal pain.  Had undergone colonoscopy in 2019 and sigmoidoscopy in 2020 showing the proctitis.  The colonoscopy prep was not good in the right colon.  He saw [name] in January and was started on pantoprazole which is helping some.  It looks like she wants to do an ultrasound but that has not been scheduled yet.  Lipase and LFTs CBC other chemistries normal.  TSH was normal last summer.  An H. pylori breath test was attempted but the test quality was not acceptable.  Does remain sexually active with receptive anal intercourse but no problems with that.  Wt Readings from Last 3 Encounters:  04/18/21 248 lb (112.5 kg)  03/21/21 238 lb (108 kg)  12/14/20 238 lb (108 kg)   June 2022, 114 kg    No Known Allergies Current Meds  Medication Sig   bictegravir-emtricitabine-tenofovir AF (BIKTARVY) 50-200-25 MG TABS tablet Take 1 tablet by mouth daily.   bisacodyl (DULCOLAX) 10 MG suppository Place 10 mg rectally as needed for moderate constipation.   pantoprazole (PROTONIX) 40 MG tablet Take 1 tablet (40 mg total) by mouth daily.   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   Qualifier: Diagnosis of  By: [name] MD, [name]     DEPRESSION 03/02/2006   Qualifier: Diagnosis of  By: [name] MD, [name]     diabetes type 2 08/18/2020   Dyslipidemia 07/02/2012   ERECTILE DYSFUNCTION, 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   Qualifier: Diagnosis of  By: [name] MD, [name]     FACIAL RASH 11/17/2008    Qualifier: Diagnosis of  By: [name] MD, [name]     GERD 07/20/2009   Qualifier: Diagnosis of  By: [name] MD, [name]     GERD (gastroesophageal reflux disease)    HIV disease ([place])    Obesity (BMI 30.0-34.9) 04/20/2016   PEDICULOSIS PUBIS (PUBIC LOUSE) 06/20/2006   Annotation: 4/06 Qualifier: Diagnosis of  By: [name] MD, [name]     Poor dentition 09/21/2016   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, 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 Qualifier: Diagnosis of  By: [name] MD, [name]     Pulmonary nodule    SINUSITIS, CHRONIC 02/18/2009   Qualifier: Diagnosis of  By: Tommy Medal MD, [name]     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   Ulcerative proctosigmoiditis, 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   Review of Systems As per HPI  Objective:   Physical Exam BP (!) 142/88    Pulse 100    Ht 5' 8"  (1.727 m)    Wt 248 lb (112.5 kg)    SpO2 98%    BMI 37.71 kg/m  Obese black man no acute distress Lungs clear Normal heart sounds without rubs murmurs or gallops The abdomen is mild to moderately obese, there is some mild tenderness in the epigastrium and left upper quadrant without organomegaly or mass. Rectal exam is performed in the presence of Dudley Major, CMA and is normal with some soft brown stool no tenderness no bleeding no mass. Anoderm normal as well.

## 2021-04-28 ENCOUNTER — Telehealth: Payer: Self-pay | Admitting: Internal Medicine

## 2021-04-28 NOTE — Telephone Encounter (Signed)
Patient called states he is having a hard time with his stomach and chest says he feels weird every time he eats anything. Seeking advise also said he has a hard time getting up in the morning. ?

## 2021-04-29 NOTE — Telephone Encounter (Signed)
Through a sign language interpreter pt was left a message to call us back  ?[phone]  ?

## 2021-05-02 NOTE — Telephone Encounter (Signed)
Through a sign language interpreter pt was left a message to call us back  ?[phone] ?

## 2021-05-04 NOTE — Telephone Encounter (Signed)
Pt was created a letter and sent to pt via Mail.  ?

## 2021-05-04 NOTE — Telephone Encounter (Signed)
Through a sign language interpreter pt was left a message to call us back  ?[phone] ?

## 2021-05-05 ENCOUNTER — Telehealth: Payer: Self-pay

## 2021-05-05 NOTE — Telephone Encounter (Signed)
Through a sign language interpreter pt is stating that he is having constipation. This is his main complaint: Pt stated that he has been taking Miralax twice a day: Pt states that his last BM was Last week on Tuesday ( 9 days ago): Pt states that he typically goes 1 to 2 weeks without having a BM:  Pt requesting something to relieve the constipation:  ?Please advise  ?

## 2021-05-06 NOTE — Telephone Encounter (Signed)
Patient called (through a sign language interpreter) requesting you to call him back.  Thank you. ?

## 2021-05-06 NOTE — Telephone Encounter (Signed)
Left message for pt to call back  °

## 2021-05-06 NOTE — Telephone Encounter (Signed)
Needs to do the following ? ?1) dulcolax (bisacodyl) 5 mg tabs take 4 (20 mg) ? ?2) in 1 hour drink 1 bottle mag citrate ? ?3 ) Then every day take 2 doses Miralax ? ?4) every other night take 2 dulcolax tabs ?

## 2021-05-06 NOTE — Telephone Encounter (Signed)
Through a sign language interpreter pt was left a message to call us back  ?

## 2021-05-09 NOTE — Telephone Encounter (Signed)
Through a sign language interpreter pt was notified of Dr. Carlean Purl recommendations: ?Needs to do the following ?  ?1) dulcolax (bisacodyl) 5 mg tabs take 4 (20 mg) ?  ?2) in 1 hour drink 1 bottle mag citrate ?  ?3 ) Then every day take 2 doses Miralax ?  ?4) every other night take 2 dulcolax tabs ? ?Pt verbalized understanding with all questions answered.  ? ?

## 2021-05-09 NOTE — Telephone Encounter (Signed)
Through a sign language interpreter pt was left a message to call us back  ?

## 2021-05-10 ENCOUNTER — Telehealth (HOSPITAL_BASED_OUTPATIENT_CLINIC_OR_DEPARTMENT_OTHER): Payer: Self-pay

## 2021-05-17 ENCOUNTER — Other Ambulatory Visit: Payer: Self-pay

## 2021-05-17 ENCOUNTER — Ambulatory Visit (HOSPITAL_BASED_OUTPATIENT_CLINIC_OR_DEPARTMENT_OTHER)
Admission: RE | Admit: 2021-05-17 | Discharge: 2021-05-17 | Disposition: A | Discharge status: Home / Self Care | Payer: Medicare Other | Source: Ambulatory Visit | Attending: Family | Admitting: Family

## 2021-05-17 DIAGNOSIS — R1013 Epigastric pain: Secondary | ICD-10-CM | POA: Insufficient documentation

## 2021-05-20 ENCOUNTER — Encounter: Payer: Self-pay | Admitting: Family

## 2021-05-20 ENCOUNTER — Ambulatory Visit (AMBULATORY_SURGERY_CENTER): Payer: Medicare Other | Admitting: Internal Medicine

## 2021-05-20 ENCOUNTER — Telehealth: Payer: Self-pay

## 2021-05-20 VITALS — BP 122/87 | HR 72 | Temp 98.4°F | Resp 22 | Ht 68.0 in | Wt 248.0 lb

## 2021-05-20 DIAGNOSIS — K760 Fatty (change of) liver, not elsewhere classified: Secondary | ICD-10-CM | POA: Insufficient documentation

## 2021-05-20 DIAGNOSIS — K2970 Gastritis, unspecified, without bleeding: Secondary | ICD-10-CM

## 2021-05-20 DIAGNOSIS — R12 Heartburn: Secondary | ICD-10-CM

## 2021-05-20 DIAGNOSIS — K219 Gastro-esophageal reflux disease without esophagitis: Secondary | ICD-10-CM | POA: Diagnosis not present

## 2021-05-20 DIAGNOSIS — K222 Esophageal obstruction: Secondary | ICD-10-CM

## 2021-05-20 DIAGNOSIS — K229 Disease of esophagus, unspecified: Secondary | ICD-10-CM | POA: Diagnosis not present

## 2021-05-20 DIAGNOSIS — R1013 Epigastric pain: Secondary | ICD-10-CM | POA: Diagnosis not present

## 2021-05-20 DIAGNOSIS — K2950 Unspecified chronic gastritis without bleeding: Secondary | ICD-10-CM | POA: Diagnosis not present

## 2021-05-20 MED ORDER — SODIUM CHLORIDE 0.9 % IV SOLN: 500.0000 mL | Freq: Once | INTRAVENOUS | Status: DC

## 2021-05-20 NOTE — Patient Instructions (Addendum)
I did not see any signs of infection, ulcers or cancer. Nothing bad seen. ?I did take some biopsies to try to get more information and will let you know results and plans. ? ?I will arrange a follow-up visit with me in a few weeks and we will review things. ? ?I appreciate the opportunity to care for you. ?Gatha Mayer, MD, [facility] ? ? ?YOU HAD AN ENDOSCOPIC PROCEDURE TODAY AT [facility]:   Refer to the procedure report that was given to you for any specific questions about what was found during the examination.  If the procedure report does not answer your questions, please call your gastroenterologist to clarify.  If you requested that your care partner not be given the details of your procedure findings, then the procedure report has been included in a sealed envelope for you to review at your convenience later. ? ?YOU SHOULD EXPECT: Some feelings of bloating in the abdomen. Passage of more gas than usual.  Walking can help get rid of the air that was put into your GI tract during the procedure and reduce the bloating. If you had a lower endoscopy (such as a colonoscopy or flexible sigmoidoscopy) you may notice spotting of blood in your stool or on the toilet paper. If you underwent a bowel prep for your procedure, you may not have a normal bowel movement for a few days. ? ?Please Note:  You might notice some irritation and congestion in your nose or some drainage.  This is from the oxygen used during your procedure.  There is no need for concern and it should clear up in a day or so. ? ?SYMPTOMS TO REPORT IMMEDIATELY: ? ?Following upper endoscopy (EGD) ? Vomiting of blood or coffee ground material ? New chest pain or pain under the shoulder blades ? Painful or persistently difficult swallowing ? New shortness of breath ? Fever of 100?F or higher ? Black, tarry-looking stools ? ?For urgent or emergent issues, a gastroenterologist can be reached at any hour by calling [phone]. ?Do not use  MyChart messaging for urgent concerns.  ? ? ?DIET:  We do recommend a small meal at first, but then you may proceed to your regular diet.  Drink plenty of fluids but you should avoid alcoholic beverages for 24 hours. ? ?ACTIVITY:  You should plan to take it easy for the rest of today and you should NOT DRIVE or use heavy machinery until tomorrow (because of the sedation medicines used during the test).   ? ?FOLLOW UP: ?Our staff will call the number listed on your records 48-72 hours following your procedure to check on you and address any questions or concerns that you may have regarding the information given to you following your procedure. If we do not reach you, we will leave a message.  We will attempt to reach you two times.  During this call, we will ask if you have developed any symptoms of COVID 19. If you develop any symptoms (ie: fever, flu-like symptoms, shortness of breath, cough etc.) before then, please call [phone].  If you test positive for Covid 19 in the 2 weeks post procedure, please call and report this information to [place].   ? ?If any biopsies were taken you will be contacted by phone or by letter within the next 1-3 weeks.  Please call us at [phone] if you have not heard about the biopsies in 3 weeks.  ? ? ?SIGNATURES/CONFIDENTIALITY: ?You and/or your care  partner have signed paperwork which will be entered into your electronic medical record.  These signatures attest to the fact that that the information above on your After Visit Summary has been reviewed and is understood.  Full responsibility of the confidentiality of this discharge information lies with you and/or your care-partner. ? ?

## 2021-05-20 NOTE — Progress Notes (Signed)
VS by CW. ?

## 2021-05-20 NOTE — Progress Notes (Signed)
Called to room to assist during endoscopic procedure.  Patient ID and intended procedure confirmed with present staff. Received instructions for my participation in the procedure from the performing physician.  

## 2021-05-20 NOTE — Progress Notes (Signed)
Sedate, gd SR, tolerated procedure well, VSS, report to RN 

## 2021-05-20 NOTE — Op Note (Signed)
[facility] ?Patient Name: Matthew Fitzpatrick ?Procedure Date: 05/20/2021 10:19 AM ?MRN: [id number] ?Endoscopist: Gatha Mayer , MD ?Age: 41 ?Referring MD:  ?Date of Birth: 09-25-1980 ?Gender: Male ?Account #: [id number] ?Procedure:                Upper GI endoscopy ?Indications:              Heartburn, Esophageal reflux symptoms that persist  ?                          despite appropriate therapy ?Medicines:                Monitored Anesthesia Care ?Procedure:                Pre-Anesthesia Assessment: ?                          - Prior to the procedure, a History and Physical  ?                          was performed, and patient medications and  ?                          allergies were reviewed. The patient's tolerance of  ?                          previous anesthesia was also reviewed. The risks  ?                          and benefits of the procedure and the sedation  ?                          options and risks were discussed with the patient.  ?                          All questions were answered, and informed consent  ?                          was obtained. Prior Anticoagulants: The patient has  ?                          taken no previous anticoagulant or antiplatelet  ?                          agents. ASA Grade Assessment: II - A patient with  ?                          mild systemic disease. After reviewing the risks  ?                          and benefits, the patient was deemed in  ?                          satisfactory condition to undergo the procedure. ?  After obtaining informed consent, the endoscope was  ?                          passed under direct vision. Throughout the  ?                          procedure, the patient's blood pressure, pulse, and  ?                          oxygen saturations were monitored continuously. The  ?                          Endoscope was introduced through the mouth, and  ?                          advanced to the second part  of duodenum. The upper  ?                          GI endoscopy was accomplished without difficulty.  ?                          The patient tolerated the procedure well. ?Scope In: ?Scope Out: ?Findings:                 A non-obstructing Schatzki ring was found at the  ?                          gastroesophageal junction. Biopsies were taken with  ?                          a cold forceps for histology. Verification of  ?                          patient identification for the specimen was done.  ?                          Estimated blood loss was minimal. ?                          The examined esophagus was normal. Biopsies were  ?                          taken with a cold forceps for histology. Estimated  ?                          blood loss was minimal. ?                          Patchy mildly erythematous mucosa was found in the  ?                          gastric antrum. Biopsies were taken with a cold  ?  forceps for histology. Verification of patient  ?                          identification for the specimen was done. Estimated  ?                          blood loss was minimal. ?                          The examined duodenum was normal. ?                          The cardia and gastric fundus were normal on  ?                          retroflexion. ?                          The gastroesophageal flap valve was visualized  ?                          endoscopically and classified as Hill Grade I  ?                          (prominent fold, tight to endoscope). ?Complications:            No immediate complications. ?Estimated Blood Loss:     Estimated blood loss was minimal. ?Impression:               - Non-obstructing Schatzki ring. Biopsied. ?                          - Normal esophagus. Biopsied. ?                          - Erythematous mucosa in the antrum. Biopsied. ?                          - Normal examined duodenum. ?                          - Gastroesophageal flap valve  classified as Hill  ?                          Grade I (prominent fold, tight to endoscope). ?Recommendation:           - Patient has a contact number available for  ?                          emergencies. The signs and symptoms of potential  ?                          delayed complications were discussed with the  ?                          patient. Return to normal activities tomorrow.  ?  Written discharge instructions were provided to the  ?                          patient. ?                          - Resume previous diet. ?                          - Continue present medications. ?                          - Await pathology results. ?                          - SCHEDULE F/U APPT W/ ME SECOND WEEK OF APRIL ?                          - anticipate arranging colonoscopy w/ double prep  ?                          for constipation problems ?Gatha Mayer, MD ?05/20/2021 10:52:07 AM ?This report has been signed electronically. ?

## 2021-05-20 NOTE — Telephone Encounter (Signed)
Pt scheduled for a follow up appointment with Dr. Carlean Purl on 06/09/2021 at 10:10 ?

## 2021-05-20 NOTE — Progress Notes (Signed)
[facility] History and Physical ? ? ?Primary Care Physician:  [name], NP ? ? ?Reason for Procedure:   Heartburn  ? ?Plan:    EGD ? ? ? ? ?HPI: Matthew Fitzpatrick is a 41 y.o. male, deaf mute, w/ heartburn problems despite PPI Tx -  ?Also having constipation problems - tx w/ MiraLax 2 doses/day and dulcolax x 2 every other night ? ?Past Medical History:  ?Diagnosis Date  ? Acute conjunctivitis 02/12/2014  ? Acute gastroenteritis 08/12/2015  ? ANXIETY 03/02/2006  ? Qualifier: Diagnosis of  By: [name] MD, [name]    ? Bronchitis 01/26/2011  ? CAP (community acquired pneumonia)   ? CHICKENPOX, HX OF 06/20/2006  ? Annotation: age 34 Qualifier: Diagnosis of  By: [name] MD, [name]    ? Chlamydia infection 03/29/2018  ? COUGH, CHRONIC 12/23/2008  ? Qualifier: Diagnosis of  By: [name] MD, [name]    ? Deaf   ? 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 Qualifier: Diagnosis of  By: [name] MD, [name]    ? DEPRESSION 03/02/2006  ? Qualifier: Diagnosis of  By: [name] MD, [name]    ? diabetes type 2 08/18/2020  ? Dyslipidemia 07/02/2012  ? 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 Qualifier: Diagnosis of  By: [name] MD, [name]    ? FACIAL RASH 11/17/2008  ? Qualifier: Diagnosis of  By: [name] MD, [name]    ? Fatty liver   ? GERD 07/20/2009  ? Qualifier: Diagnosis of  By: [name] MD, [name]    ? GERD (gastroesophageal reflux disease)   ? HIV disease ([place])   ? Monkeypox   ? Obesity (BMI 30.0-34.9) 04/20/2016  ? PEDICULOSIS PUBIS (PUBIC LOUSE) 06/20/2006  ? Annotation: 4/06 Qualifier: Diagnosis of  By: [name] MD, [name]    ? Poor dentition 09/21/2016  ? 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/26/2006  ? 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 Pulmonary nodule   ? SINUSITIS, CHRONIC 02/18/2009  ? Qualifier: Diagnosis of  By: Tommy Medal MD, Roderic Scarce    ? 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  ? Urinary frequency 04/06/2009  ? Qualifier: Diagnosis of  By: [name] MD, [name]    ? ? ?Past Surgical History:  ?Procedure  Laterality Date  ? colonoscopy    ? THROAT SURGERY    ? UMBILICAL HERNIA REPAIR    ? ? ?Prior to Admission medications   ?Medication Sig Start Date End Date Taking? Authorizing Provider  ?bictegravir-emtricitabine-tenofovir AF (BIKTARVY) 50-200-25 MG TABS tablet Take 1 tablet by mouth daily. 12/20/20  Yes Michel Bickers, MD  ?pantoprazole (PROTONIX) 40 MG tablet Take 1 tablet (40 mg total) by mouth daily. 03/21/21  Yes [name], NP  ?bisacodyl (DULCOLAX) 10 MG suppository Place 10 mg rectally as needed for moderate constipation.    [provider]  ? ? ?Current Outpatient Medications  ?Medication Sig Dispense Refill  ? bictegravir-emtricitabine-tenofovir AF (BIKTARVY) 50-200-25 MG TABS tablet Take 1 tablet by mouth daily. 30 tablet 11  ? pantoprazole (PROTONIX) 40 MG tablet Take 1 tablet (40 mg total) by mouth daily. 30 tablet 3  ? bisacodyl (DULCOLAX) 10 MG suppository Place 10 mg rectally as needed for moderate constipation.    ? ?Current Facility-Administered Medications  ?Medication Dose Route Frequency Provider Last Rate Last Admin  ? 0.9 %  sodium chloride infusion  500 mL Intravenous Once Gatha Mayer, MD      ? ? ?Allergies as of 05/20/2021  ? (No Known Allergies)  ? ? ?  Family History  ?Problem Relation Age of Onset  ? Headache Mother   ? Hypertension Mother   ? Heart attack Father 50  ? Sudden Cardiac Death Neg Hx   ? Colon cancer Neg Hx   ? Liver disease Neg Hx   ? Esophageal cancer Neg Hx   ? Rectal cancer Neg Hx   ? Stomach cancer Neg Hx   ? ? ?Social History  ? ?Socioeconomic History  ? Marital status: Single  ?  Spouse name: Not on file  ? Number of children: 0  ? Years of education: Not on file  ? Highest education level: Not on file  ?Occupational History  ? Not on file  ?Tobacco Use  ? Smoking status: Never  ? Smokeless tobacco: Never  ?Vaping Use  ? Vaping Use: Never used  ?Substance and Sexual Activity  ? Alcohol use: No  ?  Alcohol/week: 0.0 standard drinks  ? Drug use: No   ? Sexual activity: Not Currently  ?  Partners: Male  ?  Comment: declined condoms  ?Other Topics Concern  ? Not on file  ?Social History Narrative  ? Lives alone  ? Mom lives with his sister in [place]  ? No children  ? 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 Enjoys working on [employer]  ? No pets  ? ? ?Review of Systems: ? ?All other review of systems negative except as mentioned in the HPI. ? ?Physical Exam: ?Vital signs ?BP 119/87   Pulse 65   Temp 98.4 ?F (36.9 ?C)   Ht 5' 8"  (1.727 m)   Wt 248 lb (112.5 kg)   SpO2 97%   BMI 37.71 kg/m?  ? ?General:   Alert,  Well-developed, well-nourished, pleasant and cooperative in NAD ?Lungs:  Clear throughout to auscultation.   ?Heart:  Regular rate and rhythm; no murmurs, clicks, rubs,  or gallops. ?Abdomen:  Soft, nontender and nondistended. Normal bowel sounds.   ? ? ? ?@Doll Frazee  Simonne Maffucci, MD, [facility] ?[facility] ?[phone] (pager) ?05/20/2021 10:20 AM@ ? ?

## 2021-05-20 NOTE — Progress Notes (Signed)
Interpreter, Macy Mis, present during admission. ?

## 2021-05-23 NOTE — Telephone Encounter (Signed)
Pt scheduled for a follow up appointment with Dr. Carlean Purl on 06/09/2021 at 10:10 off of procedure report: ?Left message for pt to call back through sign Language Interpreter:  ? ? ? ?

## 2021-05-24 ENCOUNTER — Telehealth: Payer: Self-pay

## 2021-05-24 NOTE — Telephone Encounter (Signed)
Patient called back. Patient states he's doing fine and no concerns.  ?

## 2021-05-24 NOTE — Telephone Encounter (Signed)
Pt scheduled for a follow up appointment with Dr. Carlean Purl on 06/09/2021 at 10:10 off of procedure report: ?Left message for pt to call back through sign Language Interpreter:  ?  ?

## 2021-05-24 NOTE — Telephone Encounter (Signed)
Left message on follow up call. 

## 2021-05-24 NOTE — Telephone Encounter (Signed)
No answer, left message to call back later today, B.Nader Boys RN. 

## 2021-05-24 NOTE — Telephone Encounter (Signed)
Pt scheduled for a follow up appointment with Dr. Carlean Purl on 06/09/2021 at 10:10 off of procedure report: ?Left message for pt to call back through sign Language Interpreter:  ?

## 2021-05-25 NOTE — Telephone Encounter (Signed)
Pt scheduled for a follow up appointment with Dr. Carlean Purl on 06/09/2021 at 10:10 off of procedure report: ?Through a sign language interpreter pt was made aware: ?Pt verbalized understanding with all questions answered.  ? ?

## 2021-05-27 ENCOUNTER — Telehealth: Payer: Self-pay

## 2021-05-27 NOTE — Telephone Encounter (Signed)
Pt called in using a sign language interpreter  stating that he was having numbness in his Right Knee: Pt was notified to contact his PCP in regard to the numbness in his right knee: ?Pt verbalized understanding with all questions answered:  ?

## 2021-05-31 ENCOUNTER — Other Ambulatory Visit: Payer: Self-pay

## 2021-05-31 ENCOUNTER — Other Ambulatory Visit (HOSPITAL_COMMUNITY)
Admission: RE | Admit: 2021-05-31 | Discharge: 2021-05-31 | Disposition: A | Discharge status: Home / Self Care | Payer: Medicare Other | Source: Ambulatory Visit | Attending: Internal Medicine | Admitting: Internal Medicine

## 2021-05-31 ENCOUNTER — Other Ambulatory Visit: Payer: Medicare Other

## 2021-05-31 DIAGNOSIS — B20 Human immunodeficiency virus [HIV] disease: Secondary | ICD-10-CM | POA: Insufficient documentation

## 2021-05-31 DIAGNOSIS — E785 Hyperlipidemia, unspecified: Secondary | ICD-10-CM | POA: Diagnosis not present

## 2021-05-31 DIAGNOSIS — Z113 Encounter for screening for infections with a predominantly sexual mode of transmission: Secondary | ICD-10-CM

## 2021-05-31 DIAGNOSIS — Z79899 Other long term (current) drug therapy: Secondary | ICD-10-CM

## 2021-05-31 DIAGNOSIS — Z13220 Encounter for screening for lipoid disorders: Secondary | ICD-10-CM | POA: Diagnosis not present

## 2021-06-01 LAB — URINE CYTOLOGY ANCILLARY ONLY
Chlamydia: NEGATIVE
Comment: NEGATIVE
Comment: NORMAL
Neisseria Gonorrhea: NEGATIVE

## 2021-06-01 LAB — T-HELPER CELL (CD4) - (RCID CLINIC ONLY)
CD4 % Helper T Cell: 38 % (ref 33–65)
CD4 T Cell Abs: 1355 /uL (ref 400–1790)

## 2021-06-02 LAB — CBC WITH DIFFERENTIAL/PLATELET
Absolute Monocytes: 524 cells/uL (ref 200–950)
Basophils Absolute: 82 cells/uL (ref 0–200)
Basophils Relative: 1.2 %
Eosinophils Absolute: 102 cells/uL (ref 15–500)
Eosinophils Relative: 1.5 %
HCT: 47.1 % (ref 38.5–50.0)
Hemoglobin: 16.2 g/dL (ref 13.2–17.1)
Lymphs Abs: 4053 cells/uL — ABNORMAL HIGH (ref 850–3900)
MCH: 30.6 pg (ref 27.0–33.0)
MCHC: 34.4 g/dL (ref 32.0–36.0)
MCV: 89 fL (ref 80.0–100.0)
MPV: 10.3 fL (ref 7.5–12.5)
Monocytes Relative: 7.7 %
Neutro Abs: 2040 cells/uL (ref 1500–7800)
Neutrophils Relative %: 30 %
Platelets: 272 10*3/uL (ref 140–400)
RBC: 5.29 10*6/uL (ref 4.20–5.80)
RDW: 12.4 % (ref 11.0–15.0)
Total Lymphocyte: 59.6 %
WBC: 6.8 10*3/uL (ref 3.8–10.8)

## 2021-06-02 LAB — LIPID PANEL
Cholesterol: 258 mg/dL — ABNORMAL HIGH (ref <200)
HDL: 48 mg/dL (ref >=40)
LDL Cholesterol (Calc): 168 mg/dL (calc) — ABNORMAL HIGH
Non-HDL Cholesterol (Calc): 210 mg/dL (calc) — ABNORMAL HIGH (ref <130)
Total CHOL/HDL Ratio: 5.4 (calc) — ABNORMAL HIGH (ref <5.0)
Triglycerides: 257 mg/dL — ABNORMAL HIGH (ref <150)

## 2021-06-02 LAB — COMPLETE METABOLIC PANEL WITH GFR
AG Ratio: 1.1 (calc) (ref 1.0–2.5)
ALT: 21 U/L (ref 9–46)
AST: 18 U/L (ref 10–40)
Albumin: 4.2 g/dL (ref 3.6–5.1)
Alkaline phosphatase (APISO): 117 U/L (ref 36–130)
BUN: 10 mg/dL (ref 7–25)
CO2: 27 mmol/L (ref 20–32)
Calcium: 9.5 mg/dL (ref 8.6–10.3)
Chloride: 100 mmol/L (ref 98–110)
Creat: 1.02 mg/dL (ref 0.60–1.29)
Globulin: 3.7 g/dL (calc) (ref 1.9–3.7)
Glucose, Bld: 237 mg/dL — ABNORMAL HIGH (ref 65–99)
Potassium: 4.1 mmol/L (ref 3.5–5.3)
Sodium: 135 mmol/L (ref 135–146)
Total Bilirubin: 0.4 mg/dL (ref 0.2–1.2)
Total Protein: 7.9 g/dL (ref 6.1–8.1)
eGFR: 95 mL/min/{1.73_m2} (ref >=60)

## 2021-06-02 LAB — RPR: RPR Ser Ql: REACTIVE — AB

## 2021-06-02 LAB — HIV-1 RNA QUANT-NO REFLEX-BLD
HIV 1 RNA Quant: 181 Copies/mL — ABNORMAL HIGH
HIV-1 RNA Quant, Log: 2.26 Log cps/mL — ABNORMAL HIGH

## 2021-06-02 LAB — FLUORESCENT TREPONEMAL AB(FTA)-IGG-BLD: Fluorescent Treponemal ABS: REACTIVE — AB

## 2021-06-02 LAB — RPR TITER: RPR Titer: 1:8 {titer} — ABNORMAL HIGH

## 2021-06-09 ENCOUNTER — Encounter: Payer: Self-pay | Admitting: Internal Medicine

## 2021-06-09 ENCOUNTER — Ambulatory Visit (INDEPENDENT_AMBULATORY_CARE_PROVIDER_SITE_OTHER): Payer: Medicare Other | Admitting: Internal Medicine

## 2021-06-09 VITALS — BP 132/78 | HR 86 | Ht 68.0 in | Wt 252.8 lb

## 2021-06-09 DIAGNOSIS — K219 Gastro-esophageal reflux disease without esophagitis: Secondary | ICD-10-CM | POA: Diagnosis not present

## 2021-06-09 DIAGNOSIS — R140 Abdominal distension (gaseous): Secondary | ICD-10-CM | POA: Diagnosis not present

## 2021-06-09 DIAGNOSIS — K5909 Other constipation: Secondary | ICD-10-CM

## 2021-06-09 DIAGNOSIS — K2950 Unspecified chronic gastritis without bleeding: Secondary | ICD-10-CM

## 2021-06-09 MED ORDER — PLENVU 140 G PO SOLR: 1.0000 | ORAL | 0 refills | Status: DC

## 2021-06-09 NOTE — Progress Notes (Signed)
? ?Matthew Fitzpatrick 41 y.o. Nov 14, 1980 [id number] ? ?Assessment & Plan:  ? ?Encounter Diagnoses  ?Name Primary?  ? Other constipation Yes  ? Bloating   ? Gastroesophageal reflux disease, unspecified whether esophagitis present   ? Chronic gastritis without bleeding, unspecified gastritis type   ? ?Continue PPI ?Schedule colonoscopy to investigate constipation and bloating symptoms.  Some of this might be due to increased abdominal obesity.The risks and benefits as well as alternatives of endoscopic procedure(s) have been discussed and reviewed. All questions answered. The patient agrees to proceed. ?Double prep is prescribed.  Office staff reviewed the preparation and samples were provided with Plenvu for the second portion of the prep MiraLAX for the first part of the prep, instructions were carried out with the assistance of the sign language interpreter. ? ?Stop sprite and that may improve abdominal obesity and fatty liver issues. ? ?Subjective:  ? ?Chief Complaint: Follow-up EGD, constipation ? ?HPI ?41 year old African-American man, deaf mute, HIV, syphilis, history of infectious proctitis on multiple occasions, fatty liver disease, presents for follow-up after late March 2023 EGD demonstrated some mild esophagitis and gastritis.  Interview and exam performed with the help of a sign language interpreter via audiovisual link.  Also chronic constipation and bloating issues.  The patient reports that if he avoids spicy foods he does not to have heartburn or pain with swallowing.  Says that after he eats he feels bloated and full and has difficulty getting up not weak but feels like he cannot get out of the bed after that.  Constipation remains a problem despite multiple over-the-counter laxatives.  There is no rectal bleeding.  Last colonoscopy exam in 2019 incomplete due to inadequate prep.  Had proctitis at that time that ultimately turned out to be chlamydia. ?Wt Readings from Last 3 Encounters:   ?06/09/21 252 lb 12.8 oz (114.7 kg)  ?05/20/21 248 lb (112.5 kg)  ?04/18/21 248 lb (112.5 kg)  ? ?Weight is going up.  The patient reports drinking Sprite frequently. ? ?No Known Allergies ?Current Meds  ?Medication Sig  ? bictegravir-emtricitabine-tenofovir AF (BIKTARVY) 50-200-25 MG TABS tablet Take 1 tablet by mouth daily.  ? bisacodyl (DULCOLAX) 10 MG suppository Place 10 mg rectally as needed for moderate constipation.  ? pantoprazole (PROTONIX) 40 MG tablet Take 1 tablet (40 mg total) by mouth daily.  ? ?Past Medical History:  ?Diagnosis Date  ? Acute conjunctivitis 02/12/2014  ? Acute gastroenteritis 08/12/2015  ? ANXIETY 03/02/2006  ? Qualifier: Diagnosis of  By: [name] MD, [name]    ? Bronchitis 01/26/2011  ? CAP (community acquired pneumonia)   ? CHICKENPOX, HX OF 06/20/2006  ? Annotation: age 89 Qualifier: Diagnosis of  By: [name] MD, [name]    ? Chlamydia infection 03/29/2018  ? COUGH, CHRONIC 12/23/2008  ? Qualifier: Diagnosis of  By: [name] MD, [name]    ? Deaf   ? 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 Qualifier: Diagnosis of  By: [name] MD, [name]    ? DEPRESSION 03/02/2006  ? Qualifier: Diagnosis of  By: [name] MD, [name]    ? diabetes type 2 08/18/2020  ? Dyslipidemia 07/02/2012  ? 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 Qualifier: Diagnosis of  By: [name] MD, [name]    ? FACIAL RASH 11/17/2008  ? Qualifier: Diagnosis of  By: [name] MD, [name]    ? Fatty liver   ? GERD 07/20/2009  ? Qualifier: Diagnosis of  By: [name] MD, [name]    ? GERD (gastroesophageal reflux disease)   ?  HIV disease ([place])   ? Monkeypox   ? Obesity (BMI 30.0-34.9) 04/20/2016  ? PEDICULOSIS PUBIS (PUBIC LOUSE) 06/20/2006  ? Annotation: 4/06 Qualifier: Diagnosis of  By: [name] MD, [name]    ? Poor dentition 09/21/2016  ? 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/26/2006  ? 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 Pulmonary nodule   ? SINUSITIS, CHRONIC 02/18/2009  ? Qualifier: Diagnosis of  By: Tommy Medal MD,  Roderic Scarce    ? 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  ? Urinary frequency 04/06/2009  ? Qualifier: Diagnosis of  By: [name] MD, [name]    ? ?Past Surgical History:  ?Procedure Laterality Date  ? colonoscopy    ? THROAT SURGERY    ? UMBILICAL HERNIA REPAIR    ? ?Social History  ? ?Social History Narrative  ? Lives alone  ? Mom lives with his sister in [place]  ? No children  ? 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 Enjoys working on [employer]  ? No pets  ? ?family history includes Headache in his mother; Heart attack (age of onset: 65) in his father; Hypertension in his mother. ? ? ?Review of Systems ?As above ? ?Objective:  ? Physical Exam ?BP 132/78   Pulse 86   Ht 5' 8"  (1.727 m)   Wt 252 lb 12.8 oz (114.7 kg)   SpO2 95%   BMI 38.44 kg/m?  ?Well-developed well-nourished obese black man in no acute distress ?Lungs clear ?Normal heart sounds S1-S2 no rubs or gallops ?Abdomen is soft obese nontender no organomegaly or mass ?Rectal is deferred till colonoscopy ? ?

## 2021-06-09 NOTE — Patient Instructions (Signed)
You have been scheduled for a colonoscopy. Please follow written instructions given to you at your visit today.  ?Please pick up your prep supplies at the pharmacy within the next 1-3 days and use the plenvu sample as instructed. ?If you use inhalers (even only as needed), please bring them with you on the day of your procedure. ? ? ?I appreciate the opportunity to care for you. ?Silvano Rusk, MD, [facility] ? ?

## 2021-06-14 ENCOUNTER — Encounter: Payer: Medicare Other | Admitting: Internal Medicine

## 2021-06-23 ENCOUNTER — Ambulatory Visit (INDEPENDENT_AMBULATORY_CARE_PROVIDER_SITE_OTHER): Payer: Medicare Other | Admitting: Internal Medicine

## 2021-06-23 ENCOUNTER — Other Ambulatory Visit: Payer: Self-pay

## 2021-06-23 ENCOUNTER — Encounter: Payer: Self-pay | Admitting: Internal Medicine

## 2021-06-23 DIAGNOSIS — B20 Human immunodeficiency virus [HIV] disease: Secondary | ICD-10-CM

## 2021-06-23 DIAGNOSIS — A539 Syphilis, unspecified: Secondary | ICD-10-CM

## 2021-06-23 DIAGNOSIS — B04 Monkeypox: Secondary | ICD-10-CM

## 2021-06-23 MED ORDER — BIKTARVY 50-200-25 MG PO TABS: 1.0000 | ORAL_TABLET | Freq: Every day | ORAL | 11 refills | Status: DC

## 2021-06-23 NOTE — Assessment & Plan Note (Signed)
His RPR is down to 1:8 after retreatment for syphilis last year.  I will repeat his RPR again in 6 months.  I talked to him again about the importance of choosing his partners very carefully and limiting the number of partners as well as condom use. ?

## 2021-06-23 NOTE — Assessment & Plan Note (Signed)
All symptoms of monkeypox proctitis have resolved. ?

## 2021-06-23 NOTE — Progress Notes (Signed)
? ?   ? ? ? ? ?Patient Active Problem List  ? Diagnosis Date Noted  ? 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  ?  Priority: High  ? 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  Priority: High  ? 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  Priority: High  ? 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  ?  Priority: High  ? Chlamydia infection 03/29/2018  ?  Priority: High  ? Human immunodeficiency virus (HIV) disease (HCC) 03/02/2006  ?  Priority: High  ? Fatty liver 05/20/2021  ? Epigastric pain 03/21/2021  ? Hemorrhoids 11/29/2020  ? Rectal bleeding 11/29/2020  ? 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 Diabetes type 2, controlled (HCC) 08/18/2020  ? Insomnia 08/17/2020  ? Pes planus 10/02/2018  ? Left ankle pain 10/02/2018  ? Palpitations 10/19/2016  ? Dyspnea on exertion 10/19/2016  ? Poor dentition 09/21/2016  ? Pulmonary nodule 08/17/2016  ? Obesity (BMI 30.0-34.9) 04/20/2016  ? Dyslipidemia 07/02/2012  ? GERD (gastroesophageal reflux disease) 06/28/2010  ? 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 Hearing loss 03/26/2006  ? Anxiety state 03/02/2006  ? Depression 03/02/2006  ? ALLERGIC RHINITIS 03/02/2006  ? ? ?Patient's Medications  ?New Prescriptions  ? No medications on file  ?Previous Medications  ? BISACODYL (DULCOLAX) 10 MG SUPPOSITORY    Place 10 mg rectally as needed for moderate constipation.  ? PANTOPRAZOLE (PROTONIX) 40 MG TABLET    Take 1 tablet (40 mg total) by mouth daily.  ? PEG-KCL-NACL-NASULF-NA ASC-C (PLENVU) 140 G SOLR    Take 1 kit by mouth as directed.  ?Modified Medications  ? Modified Medication Previous Medication  ? BICTEGRAVIR-EMTRICITABINE-TENOFOVIR AF (BIKTARVY) 50-200-25 MG TABS TABLET bictegravir-emtricitabine-tenofovir AF (BIKTARVY) 50-200-25 MG TABS tablet  ?    Take 1 tablet by mouth daily.    Take 1 tablet by mouth daily.  ?Discontinued Medications  ? No medications on file  ? ? ?Subjective: ?Matthew Fitzpatrick is in for his routine HIV follow-up visit.  He denies any problems obtaining, taking or tolerating his Biktarvy and  says he has not missed any doses.  He knows that he needs to recertify his ADAP in July.  He is not having any more rectal pain or bleeding but he has been bothered by constipation.  He has been evaluated by Dr. Carl Gessner and is scheduled for a colonoscopy in May.  He has been sexually active with 1 male partner since his last visit. ? ?Review of Systems: ?Review of Systems  ?Constitutional:  Negative for fever and weight loss.  ?Gastrointestinal:  Positive for constipation. Negative for abdominal pain, blood in stool, diarrhea, nausea and vomiting.  ?Musculoskeletal:  Positive for joint pain.  ?Psychiatric/Behavioral:  Negative for depression. The patient is not nervous/anxious.   ? ?Past Medical History:  ?Diagnosis Date  ? Acute conjunctivitis 02/12/2014  ? Acute gastroenteritis 08/12/2015  ? ANXIETY 03/02/2006  ? Qualifier: Diagnosis of  By:  MD,     ? Bronchitis 01/26/2011  ? CAP (community acquired pneumonia)   ? CHICKENPOX, HX OF 06/20/2006  ? Annotation: age 11 Qualifier: Diagnosis of  By:  MD,     ? Chlamydia infection 03/29/2018  ? COUGH, CHRONIC 12/23/2008  ? Qualifier: Diagnosis of  By:  MD,     ? Deaf   ? 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 Qualifier: Diagnosis of  By:  MD,     ? DEPRESSION 03/02/2006  ? Qualifier: Diagnosis of  By:  MD,     ? diabetes type 2 08/18/2020  ? Dyslipidemia   07/02/2012  ? 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 Qualifier: Diagnosis of  By:  MD,     ? FACIAL RASH 11/17/2008  ? Qualifier: Diagnosis of  By:  MD,     ? Fatty liver   ? GERD 07/20/2009  ? Qualifier: Diagnosis of  By:  MD,     ? GERD (gastroesophageal reflux disease)   ? HIV disease (HCC)   ? Monkeypox   ? Obesity (BMI 30.0-34.9) 04/20/2016  ? PEDICULOSIS PUBIS (PUBIC LOUSE) 06/20/2006  ? Annotation: 4/06 Qualifier: Diagnosis of  By:  MD,     ? Poor dentition 09/21/2016  ? 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/26/2006  ?  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 Pulmonary nodule   ? SINUSITIS, CHRONIC 02/18/2009  ? Qualifier: Diagnosis of  By: Van Dam MD, [name]    ? 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  ? Urinary frequency 04/06/2009  ? Qualifier: Diagnosis of  By:  MD,     ? ? ?Social History  ? ?Tobacco Use  ? Smoking status: Never  ? Smokeless tobacco: Never  ?Vaping Use  ? Vaping Use: Never used  ?Substance Use Topics  ? Alcohol use: No  ?  Alcohol/week: 0.0 standard drinks  ? Drug use: No  ? ? ?Family History  ?Problem Relation Age of Onset  ? Headache Mother   ? Hypertension Mother   ? Heart attack Father 72  ? Sudden Cardiac Death Neg Hx   ? Colon cancer Neg Hx   ? Liver disease Neg Hx   ? Esophageal cancer Neg Hx   ? Rectal cancer Neg Hx   ? Stomach cancer Neg Hx   ? ? ?No Known Allergies ? ?Health Maintenance  ?Topic Date Due  ? FOOT EXAM  Never done  ? OPHTHALMOLOGY EXAM  Never done  ? URINE MICROALBUMIN  Never done  ? TETANUS/TDAP  Never done  ? COVID-19 Vaccine (4 - Booster for Pfizer series) 05/19/2020  ? HEMOGLOBIN A1C  02/17/2021  ? INFLUENZA VACCINE  09/27/2021  ? Hepatitis C Screening  Completed  ? HIV Screening  Completed  ? HPV VACCINES  Aged Out  ? ? ?Objective: ? ?Vitals:  ? 06/23/21 1003  ?BP: (!) 142/82  ?Pulse: 89  ?Temp: 98.2 ?F (36.8 ?C)  ?TempSrc: Oral  ?Weight: 249 lb (112.9 kg)  ? ?Body mass index is 37.86 kg/m?. ? ?Physical Exam ?Constitutional:   ?   Comments: He was examined with the aid of the American sign language interpreter.  He is very pleasant today.  ?Cardiovascular:  ?   Rate and Rhythm: Normal rate and regular rhythm.  ?   Heart sounds: No murmur heard. ?Pulmonary:  ?   Effort: Pulmonary effort is normal.  ?   Breath sounds: Normal breath sounds.  ?Psychiatric:     ?   Mood and Affect: Mood normal.  ? ? ?Lab Results ?Lab Results  ?Component Value Date  ? WBC 6.8 05/31/2021  ? HGB 16.2 05/31/2021  ? HCT 47.1 05/31/2021  ? MCV 89.0 05/31/2021  ? PLT  272 05/31/2021  ?  ?Lab Results  ?Component Value Date  ? CREATININE 1.02 05/31/2021  ? BUN 10 05/31/2021  ? NA 135 05/31/2021  ? K 4.1 05/31/2021  ? CL 100 05/31/2021  ? CO2 27 05/31/2021  ?  ?Lab Results  ?Component Value Date  ? ALT 21 05/31/2021  ? AST 18 05/31/2021  ? ALKPHOS 102 03/21/2021  ?   BILITOT 0.4 05/31/2021  ?  ?Lab Results  ?Component Value Date  ? CHOL 258 (H) 05/31/2021  ? HDL 48 05/31/2021  ? LDLCALC 168 (H) 05/31/2021  ? TRIG 257 (H) 05/31/2021  ? CHOLHDL 5.4 (H) 05/31/2021  ? ?Lab Results  ?Component Value Date  ? LABRPR REACTIVE (A) 05/31/2021  ? RPRTITER 1:8 (H) 05/31/2021  ? ?HIV 1 RNA Quant (Copies/mL)  ?Date Value  ?05/31/2021 181 (H)  ?10/05/2020 55 (H)  ?04/07/2020 150 (H)  ? ?CD4 T Cell Abs (/uL)  ?Date Value  ?05/31/2021 1,355  ?10/05/2020 1,214  ?04/07/2020 1,054  ? ?  ?Problem List Items Addressed This Visit   ? ?  ? High  ? Human immunodeficiency virus (HIV) disease ([place])  ?  It seems as though his adherence is excellent but his HIV viral load which had been undetectable for many years has been inching up the last 3 times it has been obtained.  His latest viral load is up to 181.  I encouraged him to continue Biktarvy and not miss any doses.  He will come in for repeat lab work in 6 months. ? ?  ?  ? Relevant Medications  ? bictegravir-emtricitabine-tenofovir AF (BIKTARVY) 50-200-25 MG TABS tablet  ? Other Relevant Orders  ? T-helper cells (CD4) count (not at [facility])  ? HIV-1 RNA quant-no reflex-bld  ? RPR  ? Syphilis  ?  His RPR is down to 1:8 after retreatment for syphilis last year.  I will repeat his RPR again in 6 months.  I talked to him again about the importance of choosing his partners very carefully and limiting the number of partners as well as condom use. ? ?  ?  ? Relevant Medications  ? bictegravir-emtricitabine-tenofovir AF (BIKTARVY) 50-200-25 MG TABS tablet  ? Human monkeypox  ?  All symptoms of monkeypox proctitis have resolved. ? ?  ?  ? Relevant Medications  ?  bictegravir-emtricitabine-tenofovir AF (BIKTARVY) 50-200-25 MG TABS tablet  ? ? ? ? ?Matthew Bickers, MD ?[facility] ?[facility] ?[phone] pager   [phone] cell ?4/27/

## 2021-06-23 NOTE — Assessment & Plan Note (Signed)
It seems as though his adherence is excellent but his HIV viral load which had been undetectable for many years has been inching up the last 3 times it has been obtained.  His latest viral load is up to 181.  I encouraged him to continue Biktarvy and not miss any doses.  He will come in for repeat lab work in 6 months. ?

## 2021-07-12 ENCOUNTER — Encounter: Payer: Self-pay | Admitting: Internal Medicine

## 2021-07-19 ENCOUNTER — Encounter: Payer: Self-pay | Admitting: Internal Medicine

## 2021-07-19 ENCOUNTER — Ambulatory Visit (AMBULATORY_SURGERY_CENTER): Payer: Medicare Other | Admitting: Internal Medicine

## 2021-07-19 VITALS — BP 115/89 | HR 64 | Temp 98.0°F | Resp 17 | Ht 68.0 in | Wt 252.0 lb

## 2021-07-19 DIAGNOSIS — E119 Type 2 diabetes mellitus without complications: Secondary | ICD-10-CM | POA: Diagnosis not present

## 2021-07-19 DIAGNOSIS — K5909 Other constipation: Secondary | ICD-10-CM | POA: Diagnosis not present

## 2021-07-19 DIAGNOSIS — F419 Anxiety disorder, unspecified: Secondary | ICD-10-CM | POA: Diagnosis not present

## 2021-07-19 DIAGNOSIS — F32A Depression, unspecified: Secondary | ICD-10-CM | POA: Diagnosis not present

## 2021-07-19 MED ORDER — SODIUM CHLORIDE 0.9 % IV SOLN: 500.0000 mL | Freq: Once | INTRAVENOUS | Status: DC

## 2021-07-19 MED ORDER — LACTULOSE 10 GM/15ML PO SOLN: 10.0000 g | Freq: Two times a day (BID) | ORAL | 5 refills | Status: DC

## 2021-07-19 NOTE — Progress Notes (Signed)
VS completed by DT.  Interpreter used today at the [facility] for this pt.  Interpreter's name is- Juliann Pulse (sign language)  Pt's states no medical or surgical changes since previsit or office visit.

## 2021-07-19 NOTE — Progress Notes (Signed)
PT taken to PACU. Monitors in place. VSS. Report given to RN. 

## 2021-07-19 NOTE — Progress Notes (Signed)
PATIENT IS DEAF. INTERPRETER Sherry Ruffing USED TO COMMUNICATE WITH PATIENT.  AND CARE PARTNER.

## 2021-07-19 NOTE — Progress Notes (Signed)
[facility] History and Physical   Primary Care Physician:  [name], NP   Reason for Procedure:   constipation  Plan:    colonoscopy     HPI: Matthew Fitzpatrick is a 41 y.o. male w/ worsening constipation problems and bloating. 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   Qualifier: Diagnosis of  By: [name] MD, [name]     DEPRESSION 03/02/2006   Qualifier: Diagnosis of  By: [name] MD, [name]     diabetes type 2 08/18/2020   Dyslipidemia 07/02/2012   ERECTILE DYSFUNCTION, 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   Qualifier: Diagnosis of  By: [name] MD, [name]     FACIAL RASH 11/17/2008   Qualifier: Diagnosis of  By: [name] MD, [name]     Fatty liver    GERD 07/20/2009   Qualifier: Diagnosis of  By: [name] MD, [name]     GERD (gastroesophageal reflux disease)    HIV disease ([place])    Monkeypox    Obesity (BMI 30.0-34.9) 04/20/2016   PEDICULOSIS PUBIS (PUBIC LOUSE) 06/20/2006   Annotation: 4/06 Qualifier: Diagnosis of  By: [name] MD, [name]     Poor dentition 09/21/2016   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, 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 Authorizing Provider  bictegravir-emtricitabine-tenofovir AF (BIKTARVY) 50-200-25 MG TABS tablet Take 1 tablet by mouth daily. 06/23/21  Yes Michel Bickers, MD  bisacodyl (DULCOLAX) 10 MG suppository Place 10 mg rectally as needed for moderate constipation.   Yes [provider]  pantoprazole (PROTONIX) 40 MG tablet Take 1 tablet (40 mg total) by mouth daily. Patient not taking: Reported on 07/19/2021 03/21/21   [name], NP    Current Outpatient Medications  Medication Sig Dispense Refill   bictegravir-emtricitabine-tenofovir AF (BIKTARVY) 50-200-25 MG TABS tablet Take 1 tablet by mouth daily. 30 tablet 11   bisacodyl (DULCOLAX) 10 MG suppository Place 10 mg rectally as needed for moderate constipation.     pantoprazole (PROTONIX) 40 MG tablet Take 1 tablet (40 mg total) by mouth daily. (Patient not taking: Reported on 07/19/2021) 30 tablet 3   Current Facility-Administered Medications  Medication Dose Route Frequency Provider Last Rate Last Admin   0.9 %  sodium chloride infusion  500 mL Intravenous Once Gatha Mayer, MD        Allergies as of 07/19/2021   (No  Known Allergies)    Family History  Problem Relation Age of Onset   Headache Mother    Hypertension Mother    Heart attack Father 18   Sudden Cardiac Death Neg Hx    Colon cancer Neg Hx    Liver disease Neg Hx    Esophageal cancer Neg Hx    Rectal cancer Neg Hx    Stomach cancer Neg Hx     Social History   Socioeconomic History   Marital status: Single    Spouse name: Not on file   Number of children: 0   Years of education: Not on file   Highest education level: Not on file  Occupational History   Not on file  Tobacco Use   Smoking status: Never   Smokeless tobacco: Never  Vaping Use   Vaping Use: Never used  Substance and Sexual  Activity   Alcohol use: No    Alcohol/week: 0.0 standard drinks   Drug use: No   Sexual activity: Not Currently    Partners: Male    Comment: declined condoms  Other Topics Concern   Not on file  Social History Narrative   Lives alone   Mom lives with his sister in [place]   No children   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  Physical Exam: Vital signs BP 130/73   Pulse 83   Temp 98 F (36.7 C) (Temporal)   Ht 5' 8"  (1.727 m)   Wt 252 lb (114.3 kg)   SpO2 96%   BMI 38.32 kg/m   General:   Alert,  Well-developed, well-nourished, pleasant and cooperative in NAD Lungs:  Clear throughout to auscultation.   Heart:  Regular rate and rhythm; no murmurs, clicks, rubs,  or gallops. Abdomen:  Soft, nontender and nondistended. Normal bowel sounds.   Neuro/Psych:  Alert and cooperative. Normal mood and affect. A and O x 3   @Anthone Prieur  Simonne Maffucci, MD, [facility] [phone] (pager) 07/19/2021 3:39 PM@

## 2021-07-19 NOTE — Patient Instructions (Addendum)
The colonoscopy was normal.  No cancer or anything bad.  I am prescribing lactulose to help with constipation. I hope that helps.  I appreciate the opportunity to care for you. Gatha Mayer, MD, FACG  YOU HAD AN ENDOSCOPIC PROCEDURE TODAY AT [facility]:   Refer to the procedure report that was given to you for any specific questions about what was found during the examination.  If the procedure report does not answer your questions, please call your gastroenterologist to clarify.  If you requested that your care partner not be given the details of your procedure findings, then the procedure report has been included in a sealed envelope for you to review at your convenience later.  YOU SHOULD EXPECT: Some feelings of bloating in the abdomen. Passage of more gas than usual.  Walking can help get rid of the air that was put into your GI tract during the procedure and reduce the bloating. If you had a lower endoscopy (such as a colonoscopy or flexible sigmoidoscopy) you may notice spotting of blood in your stool or on the toilet paper. If you underwent a bowel prep for your procedure, you may not have a normal bowel movement for a few days.  Please Note:  You might notice some irritation and congestion in your nose or some drainage.  This is from the oxygen used during your procedure.  There is no need for concern and it should clear up in a day or so.  SYMPTOMS TO REPORT IMMEDIATELY:  Following lower endoscopy (colonoscopy or flexible sigmoidoscopy):  Excessive amounts of blood in the stool  Significant tenderness or worsening of abdominal pains  Swelling of the abdomen that is new, acute  Fever of 100F or higher   For urgent or emergent issues, a gastroenterologist can be reached at any hour by calling [phone]. Do not use MyChart messaging for urgent concerns.    DIET:  We do recommend a small meal at first, but then you may proceed to your regular diet.  Drink  plenty of fluids but you should avoid alcoholic beverages for 24 hours.  ACTIVITY:  You should plan to take it easy for the rest of today and you should NOT DRIVE or use heavy machinery until tomorrow (because of the sedation medicines used during the test).    FOLLOW UP: Our staff will call the number listed on your records 48-72 hours following your procedure to check on you and address any questions or concerns that you may have regarding the information given to you following your procedure. If we do not reach you, we will leave a message.  We will attempt to reach you two times.  During this call, we will ask if you have developed any symptoms of COVID 19. If you develop any symptoms (ie: fever, flu-like symptoms, shortness of breath, cough etc.) before then, please call [phone].  If you test positive for Covid 19 in the 2 weeks post procedure, please call and report this information to [place].    If any biopsies were taken you will be contacted by phone or by letter within the next 1-3 weeks.  Please call us at [phone] if you have not heard about the biopsies in 3 weeks.    SIGNATURES/CONFIDENTIALITY: You and/or your care partner have signed paperwork which will be entered into your electronic medical record.  These signatures attest to the fact that that the information above on your After Visit Summary has been reviewed  and is understood.  Full responsibility of the confidentiality of this discharge information lies with you and/or your care-partner.

## 2021-07-19 NOTE — Op Note (Addendum)
[place] Patient Name: Matthew Fitzpatrick Procedure Date: 07/19/2021 3:36 PM MRN: [id number] Endoscopist: Gatha Mayer , MD Age: 41 Referring MD:  Date of Birth: [birth year] Gender: Male Account #: [id number] Procedure:                Colonoscopy Indications:              Constipation Medicines:                Monitored Anesthesia Care Procedure:                Pre-Anesthesia Assessment:                           - Prior to the procedure, a History and Physical                            was performed, and patient medications and                            allergies were reviewed. The patient's tolerance of                            previous anesthesia was also reviewed. The risks                            and benefits of the procedure and the sedation                            options and risks were discussed with the patient.                            All questions were answered, and informed consent                            was obtained. Prior Anticoagulants: The patient has                            taken no previous anticoagulant or antiplatelet                            agents. ASA Grade Assessment: II - A patient with                            mild systemic disease. After reviewing the risks                            and benefits, the patient was deemed in                            satisfactory condition to undergo the procedure.                           After obtaining informed consent, the colonoscope  was passed under direct vision. Throughout the                            procedure, the patient's blood pressure, pulse, and                            oxygen saturations were monitored continuously. The                            CF [id number] #[id number] was introduced through the anus                            and advanced to the the cecum, identified by                            appendiceal orifice and ileocecal valve. The                             colonoscopy was performed without difficulty. The                            patient tolerated the procedure well. The quality                            of the bowel preparation was good. The ileocecal                            valve, appendiceal orifice, and rectum were                            photographed. The bowel preparation used was                            Miralax via split dose instruction. The bowel                            preparation used was Plenvu via split dose                            instruction. Scope In: 3:52:17 PM Scope Out: 4:03:56 PM Scope Withdrawal Time: 0 hours 8 minutes 36 seconds  Total Procedure Duration: 0 hours 11 minutes 39 seconds  Findings:                 The digital rectal exam findings include decreased                            sphincter tone. Pertinent negatives include no                            palpable rectal lesions.                           The entire examined colon appeared normal on direct  and retroflexion views. Complications:            No immediate complications. Estimated Blood Loss:     Estimated blood loss: none. Impression:               - Decreased sphincter tone found on digital rectal                            exam.                           - The entire examined colon is normal on direct and                            retroflexion views.                           - No specimens collected. Recommendation:           - Patient has a contact number available for                            emergencies. The signs and symptoms of potential                            delayed complications were discussed with the                            patient. Return to normal activities tomorrow.                            Written discharge instructions were provided to the                            patient.                           - Resume previous diet.                           -  Continue present medications.                           - Repeat colonoscopy in 10 years for screening                            purposes.                           - try Lactulose 10 g bid                           - F/U me 1-2 months (being scheduled by [facility])                           Amitiza, Trulance and Linzess are not covered by  insurance Gatha Mayer, MD 07/19/2021 4:16:13 PM This report has been signed electronically.

## 2021-07-20 ENCOUNTER — Telehealth: Payer: Self-pay | Admitting: *Deleted

## 2021-07-20 ENCOUNTER — Telehealth: Payer: Self-pay

## 2021-07-20 NOTE — Telephone Encounter (Signed)
Second attempt follow up call to pt, no answer.

## 2021-07-20 NOTE — Telephone Encounter (Signed)
Attempted f/u phone call. No answer. Left message. °

## 2021-08-09 ENCOUNTER — Telehealth: Payer: Self-pay | Admitting: Internal Medicine

## 2021-08-09 NOTE — Telephone Encounter (Signed)
Patient is requesting a lactulose refill to [facility].

## 2021-09-13 ENCOUNTER — Encounter: Payer: Self-pay | Admitting: Internal Medicine

## 2021-09-13 ENCOUNTER — Ambulatory Visit (INDEPENDENT_AMBULATORY_CARE_PROVIDER_SITE_OTHER): Payer: Medicare Other | Admitting: Internal Medicine

## 2021-09-13 VITALS — BP 124/80 | HR 70 | Ht 68.0 in | Wt 228.0 lb

## 2021-09-13 DIAGNOSIS — K5909 Other constipation: Secondary | ICD-10-CM

## 2021-09-13 DIAGNOSIS — K2950 Unspecified chronic gastritis without bleeding: Secondary | ICD-10-CM | POA: Diagnosis not present

## 2021-09-13 MED ORDER — LACTULOSE 10 GM/15ML PO SOLN: 10.0000 g | Freq: Two times a day (BID) | ORAL | 5 refills | Status: DC | PRN

## 2021-09-13 NOTE — Patient Instructions (Signed)
We have sent the following medications to your pharmacy for you to pick up at your convenience: Lactulose  I appreciate the opportunity to care for you. Silvano Rusk, MD, [facility]

## 2021-09-13 NOTE — Progress Notes (Signed)
Matthew Fitzpatrick 41 y.o. 20-Mar-1980 [id number]  Assessment & Plan:   Encounter Diagnoses  Name Primary?   Other constipation Yes   Chronic gastritis without bleeding, unspecified gastritis type     Symptoms are under control.  We will try to sort out the problem with the lactulose prescription.  He should have that on hand as needed.  Follow-up as needed otherwise.  CC: Matthew Alar, NP   Subjective:   Chief Complaint: Follow-up of constipation previous gastritis  HPI The patient is seen with a sign language interpreter, he reports that his abdominal pain and constipation issues are not a problem now.  Occasionally have some hard stools but he is not using any laxatives and he stopped his pantoprazole and feels well.  I had prescribed lactulose after his colonoscopy but somehow that was not filled.  He would like to have it on hand in case.   Colonoscopy 07/19/2021  - Decreased sphincter tone found on digital rectal exam. - The entire examined colon is normal on direct and retroflexion views. - No specimens collected. . - Repeat colonoscopy in 10 years for screening purposes. - try Lactulose 10 g bid - F/U me 1-2 months (being scheduled by [facility])  No Known Allergies Current Meds  Medication Sig   bictegravir-emtricitabine-tenofovir AF (BIKTARVY) 50-200-25 MG TABS tablet Take 1 tablet by mouth daily.   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   Qualifier: Diagnosis of  By: Matthew Fitzpatrick, Matthew Fitzpatrick      DEPRESSION 03/02/2006   Qualifier: Diagnosis of  By: Matthew Fitzpatrick, Matthew Fitzpatrick     diabetes type 2 08/18/2020   Dyslipidemia 07/02/2012   ERECTILE DYSFUNCTION, 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   Qualifier: Diagnosis of  By: Matthew Fitzpatrick, Matthew Fitzpatrick     FACIAL RASH 11/17/2008   Qualifier: Diagnosis of  By: Matthew Fitzpatrick, Matthew Fitzpatrick     Fatty liver    GERD 07/20/2009   Qualifier: Diagnosis of  By: Matthew Fitzpatrick, Matthew Fitzpatrick     GERD (gastroesophageal reflux disease)    HIV disease ([place])    Monkeypox    Obesity (BMI 30.0-34.9) 04/20/2016   PEDICULOSIS PUBIS (PUBIC LOUSE) 06/20/2006   Annotation: 4/06 Qualifier: Diagnosis of  By: Matthew Fitzpatrick, Matthew Fitzpatrick     Poor dentition 09/21/2016   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, 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 Qualifier: Diagnosis of  By: Matthew Fitzpatrick, Matthew Fitzpatrick     Pulmonary nodule    SINUSITIS, CHRONIC 02/18/2009   Qualifier: Diagnosis of  By: Matthew Fitzpatrick, [name]     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   Urinary frequency 04/06/2009   Qualifier: Diagnosis of  By: Matthew Fitzpatrick, Matthew Fitzpatrick     Past Surgical History:  Procedure Laterality Date   COLONOSCOPY     ESOPHAGOGASTRODUODENOSCOPY     FLEXIBLE SIGMOIDOSCOPY     THROAT SURGERY     UMBILICAL HERNIA REPAIR     Social History   Social History Narrative   Lives alone   Mom lives with his sister  in [place]   No children   Works in Pensions consultant in [place]   Enjoys working on [employer]   No pets   family history includes Headache in his mother; Heart attack (age of onset: 84) in his father; Hypertension in his mother.   Review of Systems As per HPI  Objective:   Physical Exam BP 124/80   Pulse 70   Ht 5' 8"  (1.727 m)   Wt 228 lb (103.4 kg)   BMI 34.67 kg/m

## 2021-12-08 ENCOUNTER — Other Ambulatory Visit: Payer: Medicare Other

## 2021-12-08 ENCOUNTER — Other Ambulatory Visit: Payer: Self-pay

## 2021-12-08 DIAGNOSIS — B20 Human immunodeficiency virus [HIV] disease: Secondary | ICD-10-CM | POA: Diagnosis not present

## 2021-12-09 LAB — T-HELPER CELLS (CD4) COUNT (NOT AT ARMC)
CD4 % Helper T Cell: 34 % (ref 33–65)
CD4 T Cell Abs: 1123 /uL (ref 400–1790)

## 2021-12-13 LAB — HIV-1 RNA QUANT-NO REFLEX-BLD
HIV 1 RNA Quant: <20 Copies/mL — ABNORMAL HIGH
HIV-1 RNA Quant, Log: <1.3 Log cps/mL — ABNORMAL HIGH

## 2021-12-13 LAB — RPR TITER: RPR Titer: 1:4 {titer} — ABNORMAL HIGH

## 2021-12-13 LAB — RPR: RPR Ser Ql: REACTIVE — AB

## 2021-12-13 LAB — FLUORESCENT TREPONEMAL AB(FTA)-IGG-BLD: Fluorescent Treponemal ABS: REACTIVE — AB

## 2021-12-15 ENCOUNTER — Telehealth: Payer: Self-pay

## 2021-12-15 ENCOUNTER — Other Ambulatory Visit (HOSPITAL_COMMUNITY): Payer: Self-pay

## 2021-12-15 ENCOUNTER — Ambulatory Visit: Payer: Medicare Other

## 2021-12-15 ENCOUNTER — Other Ambulatory Visit: Payer: Self-pay

## 2021-12-15 NOTE — Telephone Encounter (Signed)
Patient called to speak to financial regarding renewing ADAP.  [place], CMA

## 2021-12-20 ENCOUNTER — Other Ambulatory Visit: Payer: Self-pay | Admitting: Pharmacist

## 2021-12-20 DIAGNOSIS — B20 Human immunodeficiency virus [HIV] disease: Secondary | ICD-10-CM

## 2021-12-20 MED ORDER — BICTEGRAVIR-EMTRICITAB-TENOFOV 50-200-25 MG PO TABS: 1.0000 | ORAL_TABLET | Freq: Every day | ORAL | 0 refills | Status: AC

## 2021-12-20 NOTE — Progress Notes (Signed)
Medication Samples have been provided to the patient.  Drug name: Biktarvy        Strength: 50/200/25 mg       Qty: 14 tablets (2 bottles) LOT: [place]   Exp.Date: 9/25  Dosing instructions: Take one tablet by mouth once daily  The patient has been instructed regarding the correct time, dose, and frequency of taking this medication, including desired effects and most common side effects.   Alfonse Spruce, PharmD, CPP, BCIDP, [place] Clinical Pharmacist Practitioner Infectious [facility]

## 2021-12-22 ENCOUNTER — Ambulatory Visit (INDEPENDENT_AMBULATORY_CARE_PROVIDER_SITE_OTHER): Payer: Medicare Other | Admitting: Internal Medicine

## 2021-12-22 ENCOUNTER — Other Ambulatory Visit: Payer: Self-pay

## 2021-12-22 ENCOUNTER — Encounter: Payer: Self-pay | Admitting: Internal Medicine

## 2021-12-22 ENCOUNTER — Ambulatory Visit: Payer: Medicare Other

## 2021-12-22 DIAGNOSIS — B20 Human immunodeficiency virus [HIV] disease: Secondary | ICD-10-CM | POA: Diagnosis not present

## 2021-12-22 MED ORDER — BIKTARVY 50-200-25 MG PO TABS: 1.0000 | ORAL_TABLET | Freq: Every day | ORAL | 11 refills | Status: DC

## 2021-12-22 NOTE — Assessment & Plan Note (Signed)
His infection remains under excellent, long-term control.  He will continue Biktarvy and follow-up after lab work in 1 year.  He says that he will come back to clinic next week to get his influenza and COVID vaccines.

## 2021-12-22 NOTE — Progress Notes (Signed)
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   Diabetes type 2, controlled ([place]) 08/18/2020   Insomnia 08/17/2020   Pes planus 10/02/2018   Left ankle pain 10/02/2018   Palpitations 10/19/2016   Dyspnea on exertion 10/19/2016   Poor dentition 09/21/2016   Pulmonary nodule 08/17/2016   Obesity (BMI 30.0-34.9) 04/20/2016   Dyslipidemia 07/02/2012   GERD (gastroesophageal reflux disease) 06/28/2010   ERECTILE DYSFUNCTION, 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   BISACODYL (DULCOLAX) 10 MG SUPPOSITORY    Place 10 mg rectally as needed for moderate constipation.   LACTULOSE (CHRONULAC) 10 GM/15ML SOLUTION    Take 15 mLs (10 g total) by mouth 2 (two) times daily as needed for mild constipation.  Modified Medications   Modified Medication Previous Medication   BICTEGRAVIR-EMTRICITABINE-TENOFOVIR AF (BIKTARVY) 50-200-25 MG TABS TABLET bictegravir-emtricitabine-tenofovir AF (BIKTARVY) 50-200-25 MG TABS tablet      Take 1 tablet by mouth daily.    Take 1 tablet by mouth daily.  Discontinued Medications   No medications on file    Subjective: Judge Stall is in for his routine HIV follow-up  visit.  I think like there was some difficulty with his a dap paperwork recently but fortunately this did not cause him to miss any doses of his Biktarvy.  He continues to tolerate it well.  He says that he had an upper respiratory infection a few weeks ago but is now feeling better.  However, he says he wants to hold off on getting influenza and updated COVID vaccines until he feels completely well.  He has not had any further episodes of GI bleeding.  Review of Systems: Review of Systems  Constitutional:  Negative for fever and weight loss.  Gastrointestinal:  Negative for blood in stool.  Psychiatric/Behavioral:  Negative for depression. The patient is not nervous/anxious.     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   Qualifier: Diagnosis of  By: [name] MD, [place]     DEPRESSION 03/02/2006   Qualifier: Diagnosis of  By: [name] MD, Alexios Keown     diabetes type 2 08/18/2020  Dyslipidemia 07/02/2012   ERECTILE DYSFUNCTION, 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   Qualifier: Diagnosis of  By: [name] MD, [place] Prout     FACIAL RASH 11/17/2008   Qualifier: Diagnosis of  By: [name] MD, Jaykub Mackins     Fatty liver    GERD 07/20/2009   Qualifier: Diagnosis of  By: [name] MD, Samera Macy     GERD (gastroesophageal reflux disease)    HIV disease ([place])    Monkeypox    Obesity (BMI 30.0-34.9) 04/20/2016   PEDICULOSIS PUBIS (PUBIC LOUSE) 06/20/2006   Annotation: 4/06 Qualifier: Diagnosis of  By: [name] MD, Derwood Becraft     Poor dentition 09/21/2016   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/26/2006    Annotation: HSV II and CMV, 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 Qualifier: Diagnosis of  By: [name] MD, Safari Cinque     Pulmonary nodule    SINUSITIS, CHRONIC 02/18/2009   Qualifier: Diagnosis of  By: Tommy Medal MD, [name]     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   Urinary frequency 04/06/2009   Qualifier: Diagnosis of  By: [name] MD, Edith Lord      Social History   Tobacco Use   Smoking status: Never   Smokeless tobacco: Never  Vaping Use   Vaping Use: Never used  Substance Use Topics   Alcohol use: No    Alcohol/week: 0.0 standard drinks of alcohol   Drug use: No    Family History  Problem Relation Age of Onset   Headache Mother    Hypertension Mother    Heart attack Father 44   Sudden Cardiac Death Neg Hx    Colon cancer Neg Hx    Liver disease Neg Hx    Esophageal cancer Neg Hx    Rectal cancer Neg Hx    Stomach cancer Neg Hx     No Known Allergies  Health Maintenance  Topic Date Due   FOOT EXAM  Never done   OPHTHALMOLOGY EXAM  Never done   Diabetic kidney evaluation - Urine ACR  Never done   TETANUS/TDAP  Never done   Medicare Annual Wellness (AWV)  09/10/2017   COVID-19 Vaccine (4 - Pfizer risk series) 05/19/2020   HEMOGLOBIN A1C  02/17/2021   INFLUENZA VACCINE  09/27/2021   Diabetic kidney evaluation - GFR measurement  06/01/2022   Hepatitis C Screening  Completed   HIV Screening  Completed   HPV VACCINES  Aged Out    Objective:  Vitals:   12/22/21 1020  BP: 132/84  Pulse: 75  Temp: 98.3 F (36.8 C)  TempSrc: Oral  SpO2: 96%  Weight: 241 lb (109.3 kg)  Height: 5' 8"  (1.727 m)   Body mass index is 36.64 kg/m.  Physical Exam Constitutional:      Comments: He seems to be in good spirits.  He was examined with the aid of the American sign language interpreter.  Cardiovascular:     Rate and Rhythm: Normal rate.  Pulmonary:     Effort: Pulmonary effort is normal.  Psychiatric:        Mood and Affect: Mood normal.     Lab Results Lab Results  Component Value Date   WBC 6.8 05/31/2021    HGB 16.2 05/31/2021   HCT 47.1 05/31/2021   MCV 89.0 05/31/2021   PLT 272 05/31/2021    Lab Results  Component Value Date   CREATININE 1.02 05/31/2021   BUN 10 05/31/2021   NA 135 05/31/2021   K 4.1 05/31/2021   CL 100 05/31/2021   CO2 27 05/31/2021    Lab Results  Component Value Date  ALT 21 05/31/2021   AST 18 05/31/2021   ALKPHOS 102 03/21/2021   BILITOT 0.4 05/31/2021    Lab Results  Component Value Date   CHOL 258 (H) 05/31/2021   HDL 48 05/31/2021   LDLCALC 168 (H) 05/31/2021   TRIG 257 (H) 05/31/2021   CHOLHDL 5.4 (H) 05/31/2021   Lab Results  Component Value Date   LABRPR REACTIVE (A) 12/08/2021   RPRTITER 1:4 (H) 12/08/2021   HIV 1 RNA Quant (Copies/mL)  Date Value  12/08/2021 <20 (H)  05/31/2021 181 (H)  10/05/2020 55 (H)   CD4 T Cell Abs (/uL)  Date Value  12/08/2021 1,123  05/31/2021 1,355  10/05/2020 1,214     Problem List Items Addressed This Visit       High   Human immunodeficiency virus (HIV) disease ([place])    His infection remains under excellent, long-term control.  He will continue Biktarvy and follow-up after lab work in 1 year.  He says that he will come back to clinic next week to get his influenza and COVID vaccines.      Relevant Medications   bictegravir-emtricitabine-tenofovir AF (BIKTARVY) 50-200-25 MG TABS tablet   Other Relevant Orders   CBC   T-helper cells (CD4) count (not at [facility])   Comprehensive metabolic panel   RPR   HIV-1 RNA quant-no reflex-bld      Michel Bickers, MD [facility] for Infectious [place] [phone] pager   [phone] cell 12/22/2021, 10:38 AM

## 2021-12-29 ENCOUNTER — Telehealth: Payer: Self-pay | Admitting: Internal Medicine

## 2021-12-29 ENCOUNTER — Other Ambulatory Visit: Payer: Self-pay

## 2021-12-29 ENCOUNTER — Ambulatory Visit (INDEPENDENT_AMBULATORY_CARE_PROVIDER_SITE_OTHER): Payer: Medicare Other

## 2021-12-29 DIAGNOSIS — Z23 Encounter for immunization: Secondary | ICD-10-CM

## 2021-12-29 NOTE — Telephone Encounter (Signed)
Message left for Matthew Fitzpatrick to call us back and let me know which pharmacy he wants the Lactulose sent into.

## 2021-12-29 NOTE — Telephone Encounter (Signed)
Inbound call from patient requesting refill on Lactulose.  Please advise

## 2022-01-02 NOTE — Telephone Encounter (Signed)
Left another detailed message with the interrupter service to please call me and let us know where to send his Lactulose to.

## 2022-01-03 MED ORDER — LACTULOSE 10 GM/15ML PO SOLN: 10.0000 g | Freq: Two times a day (BID) | ORAL | 5 refills | Status: DC | PRN

## 2022-01-03 NOTE — Telephone Encounter (Signed)
Spoke to the patient thru the interrupter and confirmed the pharmacy as [facility] in [facility] to send in his Lactulose to. He has not been taking this. He is having issues with constipation. The last time he had a bowel movement was last Friday -4 days ago. He will pick up the lactulose tonight and take it.  He said Miralax and OTC [facility] brand laxative have not helped him. He will let us know if the lactulose doesn't help him.

## 2022-01-03 NOTE — Telephone Encounter (Signed)
Patient returned your call. States he will call back around 4pm.

## 2022-03-20 ENCOUNTER — Telehealth: Payer: Self-pay

## 2022-03-20 ENCOUNTER — Other Ambulatory Visit (HOSPITAL_COMMUNITY): Payer: Self-pay

## 2022-03-20 NOTE — Telephone Encounter (Signed)
Patient left voicemail. States he is having problems getting medication from [facility] because they do not have his Medicare on file.   Beryle Flock, RN

## 2022-03-24 ENCOUNTER — Other Ambulatory Visit (HOSPITAL_COMMUNITY): Payer: Self-pay

## 2022-03-28 ENCOUNTER — Other Ambulatory Visit: Payer: Self-pay | Admitting: Pharmacist

## 2022-03-28 DIAGNOSIS — H209 Unspecified iridocyclitis: Secondary | ICD-10-CM | POA: Diagnosis not present

## 2022-03-28 DIAGNOSIS — H2513 Age-related nuclear cataract, bilateral: Secondary | ICD-10-CM | POA: Diagnosis not present

## 2022-03-28 DIAGNOSIS — Z23 Encounter for immunization: Secondary | ICD-10-CM

## 2022-03-28 DIAGNOSIS — B20 Human immunodeficiency virus [HIV] disease: Secondary | ICD-10-CM

## 2022-03-28 DIAGNOSIS — H35413 Lattice degeneration of retina, bilateral: Secondary | ICD-10-CM | POA: Diagnosis not present

## 2022-03-28 MED ORDER — BICTEGRAVIR-EMTRICITAB-TENOFOV 50-200-25 MG PO TABS: 1.0000 | ORAL_TABLET | Freq: Every day | ORAL | 0 refills | Status: DC

## 2022-03-28 NOTE — Progress Notes (Signed)
Medication Samples have been provided to the patient.  Drug name: Biktarvy        Strength: 50/200/25 mg       Qty: 14 tablets (2 bottles) LOT: CPBDCA   Exp.Date: 3/26  Dosing instructions: Take one tablet by mouth once daily  The patient has been instructed regarding the correct time, dose, and frequency of taking this medication, including desired effects and most common side effects.   Ronna Herskowitz, PharmD, CPP, BCIDP, AAHIVP Clinical Pharmacist Practitioner Infectious Diseases Clinical Pharmacist [facility]  

## 2022-03-29 ENCOUNTER — Other Ambulatory Visit: Payer: Self-pay

## 2022-03-29 DIAGNOSIS — B20 Human immunodeficiency virus [HIV] disease: Secondary | ICD-10-CM

## 2022-03-29 MED ORDER — BICTEGRAVIR-EMTRICITAB-TENOFOV 50-200-25 MG PO TABS: 1.0000 | ORAL_TABLET | Freq: Every day | ORAL | 1 refills | Status: AC

## 2022-06-12 ENCOUNTER — Encounter: Payer: Self-pay | Admitting: *Deleted

## 2022-08-02 ENCOUNTER — Other Ambulatory Visit: Payer: Self-pay

## 2022-08-02 ENCOUNTER — Other Ambulatory Visit: Payer: Medicare Other

## 2022-08-02 DIAGNOSIS — B20 Human immunodeficiency virus [HIV] disease: Secondary | ICD-10-CM

## 2022-08-02 DIAGNOSIS — Z113 Encounter for screening for infections with a predominantly sexual mode of transmission: Secondary | ICD-10-CM | POA: Diagnosis not present

## 2022-08-02 LAB — COMPLETE METABOLIC PANEL WITH GFR
ALT: 20 U/L (ref 9–46)
BUN: 11 mg/dL (ref 7–25)
Calcium: 9.6 mg/dL (ref 8.6–10.3)
Creat: 1.12 mg/dL (ref 0.60–1.29)
Globulin: 3.3 g/dL (calc) (ref 1.9–3.7)
Potassium: 3.9 mmol/L (ref 3.5–5.3)
Sodium: 135 mmol/L (ref 135–146)
Total Bilirubin: 0.8 mg/dL (ref 0.2–1.2)
Total Protein: 7.8 g/dL (ref 6.1–8.1)

## 2022-08-02 LAB — CBC WITH DIFFERENTIAL/PLATELET
Basophils Absolute: 99 cells/uL (ref 0–200)
Basophils Relative: 1.5 %
Eosinophils Absolute: 73 cells/uL (ref 15–500)
HCT: 45.8 % (ref 38.5–50.0)
Lymphs Abs: 4046 cells/uL — ABNORMAL HIGH (ref 850–3900)
MCH: 30.5 pg (ref 27.0–33.0)
MCHC: 33.8 g/dL (ref 32.0–36.0)
MCV: 90.2 fL (ref 80.0–100.0)
Neutro Abs: 1868 cells/uL (ref 1500–7800)
RBC: 5.08 10*6/uL (ref 4.20–5.80)
WBC: 6.6 10*3/uL (ref 3.8–10.8)

## 2022-08-03 LAB — RPR TITER: RPR Titer: 1:8 {titer} — ABNORMAL HIGH

## 2022-08-03 LAB — T-HELPER CELL (CD4) - (RCID CLINIC ONLY)
CD4 % Helper T Cell: 42 % (ref 33–65)
CD4 T Cell Abs: 1420 /uL (ref 400–1790)

## 2022-08-05 LAB — COMPLETE METABOLIC PANEL WITH GFR
AG Ratio: 1.4 (calc) (ref 1.0–2.5)
AST: 21 U/L (ref 10–40)
Albumin: 4.5 g/dL (ref 3.6–5.1)
Alkaline phosphatase (APISO): 127 U/L (ref 36–130)
CO2: 28 mmol/L (ref 20–32)
Chloride: 98 mmol/L (ref 98–110)
Glucose, Bld: 353 mg/dL — ABNORMAL HIGH (ref 65–99)
eGFR: 84 mL/min/{1.73_m2} (ref >=60)

## 2022-08-05 LAB — CBC WITH DIFFERENTIAL/PLATELET
Absolute Monocytes: 515 cells/uL (ref 200–950)
Eosinophils Relative: 1.1 %
Hemoglobin: 15.5 g/dL (ref 13.2–17.1)
MPV: 11.2 fL (ref 7.5–12.5)
Monocytes Relative: 7.8 %
Neutrophils Relative %: 28.3 %
Platelets: 240 10*3/uL (ref 140–400)
RDW: 11.8 % (ref 11.0–15.0)
Total Lymphocyte: 61.3 %

## 2022-08-05 LAB — RPR: RPR Ser Ql: REACTIVE — AB

## 2022-08-05 LAB — HIV-1 RNA QUANT-NO REFLEX-BLD
HIV 1 RNA Quant: 38 Copies/mL — ABNORMAL HIGH
HIV-1 RNA Quant, Log: 1.58 Log cps/mL — ABNORMAL HIGH

## 2022-08-05 LAB — T PALLIDUM AB: T Pallidum Abs: POSITIVE — AB

## 2022-08-07 NOTE — Progress Notes (Signed)
The 10-year ASCVD risk score (Arnett DK, et al., 2019) is: 7.5%   Values used to calculate the score:     Age: 42 years     Sex: Male     Is Non-Hispanic African American: Yes     Diabetic: Yes     Tobacco smoker: No     Systolic Blood Pressure: 132 mmHg     Is BP treated: No     HDL Cholesterol: 48 mg/dL     Total Cholesterol: 258 mg/dL  Sandie Ano, RN

## 2022-08-16 ENCOUNTER — Encounter: Payer: Self-pay | Admitting: Infectious Disease

## 2022-08-16 ENCOUNTER — Ambulatory Visit (INDEPENDENT_AMBULATORY_CARE_PROVIDER_SITE_OTHER): Payer: Medicare Other | Admitting: Infectious Disease

## 2022-08-16 ENCOUNTER — Other Ambulatory Visit: Payer: Self-pay

## 2022-08-16 ENCOUNTER — Other Ambulatory Visit (HOSPITAL_COMMUNITY): Payer: Self-pay

## 2022-08-16 ENCOUNTER — Ambulatory Visit: Payer: Self-pay | Admitting: Internal Medicine

## 2022-08-16 VITALS — BP 108/67 | HR 83 | Resp 16 | Ht 68.0 in | Wt 217.0 lb

## 2022-08-16 DIAGNOSIS — H9193 Unspecified hearing loss, bilateral: Secondary | ICD-10-CM | POA: Diagnosis not present

## 2022-08-16 DIAGNOSIS — E118 Type 2 diabetes mellitus with unspecified complications: Secondary | ICD-10-CM | POA: Diagnosis not present

## 2022-08-16 DIAGNOSIS — E785 Hyperlipidemia, unspecified: Secondary | ICD-10-CM

## 2022-08-16 DIAGNOSIS — R351 Nocturia: Secondary | ICD-10-CM

## 2022-08-16 DIAGNOSIS — B20 Human immunodeficiency virus [HIV] disease: Secondary | ICD-10-CM | POA: Diagnosis not present

## 2022-08-16 HISTORY — DX: Nocturia: R35.1

## 2022-08-16 MED ORDER — BIKTARVY 50-200-25 MG PO TABS: 1.0000 | ORAL_TABLET | Freq: Every day | ORAL | 11 refills | Status: DC

## 2022-08-16 MED ORDER — ATORVASTATIN CALCIUM 20 MG PO TABS: 20.0000 mg | ORAL_TABLET | Freq: Every day | ORAL | 11 refills | Status: DC

## 2022-08-16 NOTE — Progress Notes (Signed)
Sign language in person interpreter present  Subjective:  Chief complaint dysuria and nocturia  Patient ID: Matthew Fitzpatrick, male    DOB: [birth year], 42 y.o.   MRN: [id number]  HPI  [name] is a 42 year old Black man with HIV disease is typically been perfectly controlled on Biktarvy.  He was seen and followed by my partner Dr. Orvan Falconer who is now retired.  He is therefore been transitioned over to to me for his HIV care.  He is deaf and needs a sign language interpreter in person which she had for this visit.  He has been having problems with dysuria and in particular nocturia over the last 2 weeks.  On reviewing his labs also notices serum glucose was above 350.  He denied having diabetes mellitus though it was on his problem list.  He was telling me that something needed to be done for his BIKTARVY to be covered it appears that he needs to apply for Medicare part D and SPAP which we are having him do with Deanna  Past Medical History:  Diagnosis Date   Acute conjunctivitis 02/12/2014   Acute gastroenteritis 08/12/2015   ANXIETY 03/02/2006   Qualifier: Diagnosis of  By: Orvan Falconer MD, [name]     Bronchitis 01/26/2011   CAP (community acquired pneumonia)    CHICKENPOX, HX OF 06/20/2006   Annotation: age 38 Qualifier: Diagnosis of  By: Orvan Falconer MD, [name]     Chlamydia infection 03/29/2018   COUGH, CHRONIC 12/23/2008   Qualifier: Diagnosis of  By: Orvan Falconer MD, [name]     Deaf    DEAFNESS, 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   Qualifier: Diagnosis of  By: Orvan Falconer MD, [name]     DEPRESSION 03/02/2006   Qualifier: Diagnosis of  By: Orvan Falconer MD, [name]     diabetes type 2 08/18/2020   Dyslipidemia 07/02/2012   ERECTILE DYSFUNCTION, 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   Qualifier: Diagnosis of  By: Orvan Falconer MD, [name]     FACIAL RASH 11/17/2008   Qualifier: Diagnosis of  By: Orvan Falconer MD, [name]     Fatty liver    GERD 07/20/2009   Qualifier: Diagnosis of  By: Orvan Falconer MD, [name]     GERD (gastroesophageal reflux  disease)    HIV disease (HCC)    Monkeypox    Nocturia 08/16/2022   Obesity (BMI 30.0-34.9) 04/20/2016   PEDICULOSIS PUBIS (PUBIC LOUSE) 06/20/2006   Annotation: 4/06 Qualifier: Diagnosis of  By: Orvan Falconer MD, [name]     Poor dentition 09/21/2016   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, 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 Qualifier: Diagnosis of  By: Orvan Falconer MD, [name]     Pulmonary nodule    SINUSITIS, CHRONIC 02/18/2009   Qualifier: Diagnosis of  By: Daiva Eves MD, Eshaan Titzer     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   Urinary frequency 04/06/2009   Qualifier: Diagnosis of  By: Orvan Falconer MD, [name]      Past Surgical History:  Procedure Laterality Date   COLONOSCOPY     ESOPHAGOGASTRODUODENOSCOPY     FLEXIBLE SIGMOIDOSCOPY     THROAT SURGERY     UMBILICAL HERNIA REPAIR      Family History  Problem Relation Age of Onset   Headache Mother    Hypertension Mother    Heart attack Father 70   Sudden Cardiac Death Neg Hx    Colon cancer Neg Hx    Liver disease Neg Hx    Esophageal cancer Neg Hx    Rectal cancer Neg Hx    Stomach cancer Neg Hx  Social History   Socioeconomic History   Marital status: Single    Spouse name: Not on file   Number of children: 0   Years of education: Not on file   Highest education level: Not on file  Occupational History   Not on file  Tobacco Use   Smoking status: Never    Passive exposure: Never   Smokeless tobacco: Never  Vaping Use   Vaping Use: Never used  Substance and Sexual Activity   Alcohol use: No    Alcohol/week: 0.0 standard drinks of alcohol   Drug use: No   Sexual activity: Not Currently    Partners: Male    Comment: declined condoms  Other Topics Concern   Not on file  Social History Narrative   Lives alone   Mom lives with his sister in [place]   No children   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   Enjoys working on Environmental manager games   No pets   Social Determinants of Corporate investment banker Strain: Not on Pensions consultant Insecurity: Not on file  Transportation Needs: Not on file  Physical Activity: Not on file  Stress: Not on file  Social Connections: Not on file    No Known Allergies   Current Outpatient Medications:    acyclovir (ZOVIRAX) 800 MG tablet, Take by mouth., Disp: , Rfl:    atorvastatin (LIPITOR) 20 MG tablet, Take 1 tablet (20 mg total) by mouth daily., Disp: 30 tablet, Rfl: 11   brimonidine (ALPHAGAN) 0.15 % ophthalmic solution, Apply to eye., Disp: , Rfl:    dorzolamide (TRUSOPT) 2 % ophthalmic solution, Apply to eye., Disp: , Rfl:    folic acid (FOLVITE) 1 MG tablet, Take by mouth., Disp: , Rfl:    lactulose (CHRONULAC) 10 GM/15ML solution, Take 15 mLs (10 g total) by mouth 2 (two) times daily as needed for mild constipation., Disp: 473 mL, Rfl: 5   methotrexate (RHEUMATREX) 2.5 MG tablet, Take by mouth., Disp: , Rfl:    timolol (TIMOPTIC-XR) 0.25 % ophthalmic gel-forming, Apply to eye., Disp: , Rfl:    bictegravir-emtricitabine-tenofovir AF (BIKTARVY) 50-200-25 MG TABS tablet, Take 1 tablet by mouth daily., Disp: 30 tablet, Rfl: 11   bisacodyl (DULCOLAX) 10 MG suppository, Place 10 mg rectally as needed for moderate constipation. (Patient not taking: Reported on 09/13/2021), Disp: , Rfl:     Review of Systems  Constitutional:  Negative for activity change, appetite change, chills, diaphoresis, fatigue, fever and unexpected weight change.  HENT:  Negative for congestion, rhinorrhea, sinus pressure, sneezing, sore throat and trouble swallowing.   Eyes:  Negative for photophobia and visual disturbance.  Respiratory:  Negative for cough, chest tightness, shortness of breath, wheezing and stridor.   Cardiovascular:  Negative for chest pain, palpitations and leg swelling.  Gastrointestinal:  Negative for abdominal distention, abdominal pain, anal bleeding, blood in stool, constipation, diarrhea, nausea and vomiting.  Genitourinary:  Positive for dysuria. Negative for difficulty  urinating, flank pain and hematuria.  Musculoskeletal:  Negative for arthralgias, back pain, gait problem, joint swelling and myalgias.  Skin:  Negative for color change, pallor, rash and wound.  Neurological:  Negative for dizziness, tremors, weakness and light-headedness.  Hematological:  Negative for adenopathy. Does not bruise/bleed easily.  Psychiatric/Behavioral:  Negative for agitation, behavioral problems, confusion, decreased concentration, dysphoric mood and sleep disturbance.        Objective:   Physical Exam Exam conducted with a chaperone present.  Constitutional:  Appearance: He is well-developed.  HENT:     Head: Normocephalic and atraumatic.  Eyes:     Conjunctiva/sclera: Conjunctivae normal.  Cardiovascular:     Rate and Rhythm: Normal rate and regular rhythm.  Pulmonary:     Effort: Pulmonary effort is normal. No respiratory distress.     Breath sounds: No wheezing.  Abdominal:     General: There is no distension.     Palpations: Abdomen is soft.  Genitourinary:    Prostate: Enlarged. Not tender and no nodules present.     Rectum: Normal.  Musculoskeletal:        General: No tenderness. Normal range of motion.     Cervical back: Normal range of motion and neck supple.  Skin:    General: Skin is warm and dry.     Coloration: Skin is not pale.     Findings: No erythema or rash.  Neurological:     General: No focal deficit present.     Mental Status: He is alert and oriented to person, place, and time.  Psychiatric:        Mood and Affect: Mood normal.        Behavior: Behavior normal.        Thought Content: Thought content normal.        Judgment: Judgment normal.           Assessment & Plan:   Dysuria and nocturia: I suspect he has prostatitis and we are checking a urine culture after his prostate exam I am also checking a PSA.  Hyperglycemia: I suspect he does indeed have diabetes mellitus and I am checking a hemoglobin A1c and if positive  will start metformin and try to get him plugged into primary care he is listed as being followed by Sandford Craze.   HIV disease:  I have reviewed Shanna Cisco Romney's labs including viral load which was  Lab Results  Component Value Date   HIV1RNAQUANT 38 (H) 08/02/2022   and cd4 which was  Lab Results  Component Value Date   CD4TABS 1,420 08/02/2022     I am continuing patient's prescription for Biktarvy,   Hyperlipidemia: reviewed REPRIEVE study conclusions and DHHS guidelines which are    HIV receiving care in the [place].  Panel's Recommendations For people with HIV who have low-to-intermediate (<20%) 10-year atherosclerotic cardiovascular disease (ASCVD) risk estimates  Age 84-75 years When 10-year ASCVD risk estimates are 5% to <20%, the Panel for the Use of Antiretroviral Agents in Adults and Adolescents with HIV (the Panel) recommends initiating at least moderate-intensity statin therapy (AI). Recommended options for moderate-intensity statin therapy include the following: Pitavastatin 4 mg once daily (AI) Atorvastatin 20 mg once daily (AII) Rosuvastatin 10 mg once daily (AII) When 10-year ASCVD risk estimates are <5%, the Panel favors initiating at least moderate-intensity statin therapy (CI). The absolute benefit from statin therapy is modest in this population; therefore, the decision to initiate a statin should take into account the presence or absence of HIV-related factors that can increase ASCVD risk.a Same options for moderate-intensity statin therapy as recommended for 10-year ASCVD risk estimates of 5% to <20% (see above) Age <40 years Data are insufficient to recommend for or against statin therapy as primary prevention of ASCVD in people with HIV. In the general population, lifestyle modifications are recommended for people age <40 years, with statin therapy considered only in select populations (see American Heart Association (AHA)/American  College of Cardiology (ACC)/Multisociety Guidelines). Key Recommendations for  the General Population (Including People with HIV) Based on AHA/ACC/Multisociety Guidelines For people age 46-75 years who have high (?20%) 10-year ASCVD risk estimates  Initiate high-intensity statin therapy. For people age 59-75 years who have low-density lipoprotein cholesterol (LDL-C) ?190 mg/dL  Initiate high-intensity statin therapy at maximum tolerated dose. For people age 17-75 years with diabetes mellitus  Initiate at least moderate-intensity statin therapy. Perform further risk assessment to consider using a high-intensity statin.  We will start atorvastatin but if he indeed has DM will need more intense dose  I have personally spent 44  minutes involved in face-to-face and non-face-to-face activities for this patient on the day of the visit. Professional time spent includes the following activities: Preparing to see the patient (review of tests), Obtaining and/or reviewing separately obtained history (admission/discharge record), Performing a medically appropriate examination and/or evaluation , Ordering medications/tests/procedures, referring and communicating with other health care professionals, Documenting clinical information in the EMR, Independently interpreting results (not separately reported), Communicating results to the patient/family/caregiver, Counseling and educating the patient/family/caregiver and Care coordination (not separately reported).

## 2022-08-17 ENCOUNTER — Telehealth: Payer: Self-pay

## 2022-08-17 LAB — URINE CULTURE
MICRO NUMBER:: 15102978
Result:: NO GROWTH
SPECIMEN QUALITY:: ADEQUATE

## 2022-08-17 LAB — PSA: PSA: 1.87 ng/mL (ref <=4.00)

## 2022-08-17 LAB — HEMOGLOBIN A1C: Hgb A1c MFr Bld: >14 % of total Hgb — ABNORMAL HIGH (ref <5.7)

## 2022-08-17 NOTE — Telephone Encounter (Signed)
Attempted to call patient regarding A1C. Not able to reach him at this time. Left voicemail requesting call back. Will send mychart message as well. Juanita Laster, RMA

## 2022-08-17 NOTE — Telephone Encounter (Signed)
-----   Message from Randall Hiss, MD sent at 08/17/2022  9:09 AM EDT ----- Regarding: A1c is 14 He does indeed have Diabetes which is completely out of control. I dont think Sherron Monday is something is going to control an A1c that high but we could certainly start him on 500 mg twice daily but he needs to see his PCP ASAP and get on a more comprehensive regimen for this hopefully with something such as [id number] ----- Message ----- From: Janace Hoard Lab Results In Sent: 08/17/2022  12:59 AM EDT To: Randall Hiss, MD

## 2022-08-18 NOTE — Telephone Encounter (Signed)
Patient returned call using sign language interpreter.  Patient advised he does have DM and it is not under control. Patient does not wish to start medication at this time and will discuss this with his pcp. Matthew Fitzpatrick, CMA

## 2022-08-20 ENCOUNTER — Telehealth: Payer: Self-pay | Admitting: Family

## 2022-08-20 NOTE — Telephone Encounter (Signed)
Patient's sugar is extremely high.  I would like to get him in to see me this week please.

## 2022-08-21 NOTE — Telephone Encounter (Signed)
Patient contacted and will be seen Wednesday am

## 2022-08-23 ENCOUNTER — Ambulatory Visit (INDEPENDENT_AMBULATORY_CARE_PROVIDER_SITE_OTHER): Payer: Medicare Other | Admitting: Family

## 2022-08-23 ENCOUNTER — Encounter (HOSPITAL_BASED_OUTPATIENT_CLINIC_OR_DEPARTMENT_OTHER): Payer: Self-pay

## 2022-08-23 ENCOUNTER — Other Ambulatory Visit: Payer: Self-pay

## 2022-08-23 ENCOUNTER — Observation Stay (HOSPITAL_BASED_OUTPATIENT_CLINIC_OR_DEPARTMENT_OTHER)
Admission: EM | Admit: 2022-08-23 | Discharge: 2022-08-24 | Disposition: A | Discharge status: Home / Self Care | Payer: Medicare Other | Attending: Internal Medicine | Admitting: Internal Medicine

## 2022-08-23 VITALS — BP 120/72 | HR 73 | Temp 97.9°F | Wt 211.0 lb

## 2022-08-23 DIAGNOSIS — Z7984 Long term (current) use of oral hypoglycemic drugs: Secondary | ICD-10-CM | POA: Insufficient documentation

## 2022-08-23 DIAGNOSIS — Z21 Asymptomatic human immunodeficiency virus [HIV] infection status: Secondary | ICD-10-CM | POA: Diagnosis not present

## 2022-08-23 DIAGNOSIS — Z79899 Other long term (current) drug therapy: Secondary | ICD-10-CM | POA: Diagnosis not present

## 2022-08-23 DIAGNOSIS — E669 Obesity, unspecified: Secondary | ICD-10-CM | POA: Diagnosis present

## 2022-08-23 DIAGNOSIS — E1165 Type 2 diabetes mellitus with hyperglycemia: Secondary | ICD-10-CM

## 2022-08-23 DIAGNOSIS — E785 Hyperlipidemia, unspecified: Secondary | ICD-10-CM | POA: Diagnosis present

## 2022-08-23 DIAGNOSIS — E66811 Obesity, class 1: Secondary | ICD-10-CM | POA: Diagnosis present

## 2022-08-23 DIAGNOSIS — E119 Type 2 diabetes mellitus without complications: Secondary | ICD-10-CM | POA: Diagnosis present

## 2022-08-23 DIAGNOSIS — B20 Human immunodeficiency virus [HIV] disease: Secondary | ICD-10-CM | POA: Diagnosis present

## 2022-08-23 DIAGNOSIS — K219 Gastro-esophageal reflux disease without esophagitis: Secondary | ICD-10-CM | POA: Diagnosis present

## 2022-08-23 DIAGNOSIS — R739 Hyperglycemia, unspecified: Secondary | ICD-10-CM

## 2022-08-23 DIAGNOSIS — E1100 Type 2 diabetes mellitus with hyperosmolarity without nonketotic hyperglycemic-hyperosmolar coma (NKHHC): Secondary | ICD-10-CM | POA: Diagnosis not present

## 2022-08-23 LAB — COMPREHENSIVE METABOLIC PANEL
ALT: 14 U/L (ref 0–44)
AST: 18 U/L (ref 15–41)
Albumin: 3.6 g/dL (ref 3.5–5.0)
Alkaline Phosphatase: 141 U/L — ABNORMAL HIGH (ref 38–126)
Anion gap: 11 (ref 5–15)
BUN: 13 mg/dL (ref 6–20)
CO2: 24 mmol/L (ref 22–32)
Calcium: 8.9 mg/dL (ref 8.9–10.3)
Chloride: 88 mmol/L — ABNORMAL LOW (ref 98–111)
Creatinine, Ser: 1.11 mg/dL (ref 0.61–1.24)
GFR, Estimated: >60 mL/min (ref >60)
Glucose, Bld: 791 mg/dL (ref 70–99)
Potassium: 4.6 mmol/L (ref 3.5–5.1)
Sodium: 123 mmol/L — ABNORMAL LOW (ref 135–145)
Total Bilirubin: 0.7 mg/dL (ref 0.3–1.2)
Total Protein: 7.6 g/dL (ref 6.5–8.1)

## 2022-08-23 LAB — OSMOLALITY: Osmolality: 310 mOsm/kg — ABNORMAL HIGH (ref 275–295)

## 2022-08-23 LAB — URINALYSIS, MICROSCOPIC (REFLEX)
Bacteria, UA: NONE SEEN
Squamous Epithelial / HPF: NONE SEEN /HPF (ref 0–5)
WBC, UA: NONE SEEN WBC/hpf (ref 0–5)

## 2022-08-23 LAB — URINALYSIS, ROUTINE W REFLEX MICROSCOPIC
Bilirubin Urine: NEGATIVE
Glucose, UA: >=500 mg/dL — AB
Hgb urine dipstick: NEGATIVE
Ketones, ur: NEGATIVE mg/dL
Leukocytes,Ua: NEGATIVE
Nitrite: NEGATIVE
Protein, ur: NEGATIVE mg/dL
Specific Gravity, Urine: 1.01 (ref 1.005–1.030)
pH: 6.5 (ref 5.0–8.0)

## 2022-08-23 LAB — BASIC METABOLIC PANEL
Anion gap: 8 (ref 5–15)
Anion gap: 8 (ref 5–15)
Anion gap: 6 (ref 5–15)
BUN: 12 mg/dL (ref 6–20)
BUN: 11 mg/dL (ref 6–20)
BUN: 12 mg/dL (ref 6–20)
CO2: 26 mmol/L (ref 22–32)
CO2: 24 mmol/L (ref 22–32)
CO2: 26 mmol/L (ref 22–32)
Calcium: 9.1 mg/dL (ref 8.9–10.3)
Calcium: 8.9 mg/dL (ref 8.9–10.3)
Calcium: 9 mg/dL (ref 8.9–10.3)
Chloride: 101 mmol/L (ref 98–111)
Chloride: 101 mmol/L (ref 98–111)
Chloride: 100 mmol/L (ref 98–111)
Creatinine, Ser: 0.9 mg/dL (ref 0.61–1.24)
Creatinine, Ser: 0.97 mg/dL (ref 0.61–1.24)
Creatinine, Ser: 0.86 mg/dL (ref 0.61–1.24)
GFR, Estimated: >60 mL/min (ref >60)
GFR, Estimated: >60 mL/min (ref >60)
GFR, Estimated: >60 mL/min (ref >60)
Glucose, Bld: 317 mg/dL — ABNORMAL HIGH (ref 70–99)
Glucose, Bld: 316 mg/dL — ABNORMAL HIGH (ref 70–99)
Glucose, Bld: 296 mg/dL — ABNORMAL HIGH (ref 70–99)
Potassium: 3.7 mmol/L (ref 3.5–5.1)
Potassium: 3.8 mmol/L (ref 3.5–5.1)
Potassium: 3.5 mmol/L (ref 3.5–5.1)
Sodium: 135 mmol/L (ref 135–145)
Sodium: 133 mmol/L — ABNORMAL LOW (ref 135–145)
Sodium: 132 mmol/L — ABNORMAL LOW (ref 135–145)

## 2022-08-23 LAB — CBC
HCT: 40.9 % (ref 39.0–52.0)
Hemoglobin: 14 g/dL (ref 13.0–17.0)
MCH: 31 pg (ref 26.0–34.0)
MCHC: 34.2 g/dL (ref 30.0–36.0)
MCV: 90.7 fL (ref 80.0–100.0)
Platelets: 236 10*3/uL (ref 150–400)
RBC: 4.51 MIL/uL (ref 4.22–5.81)
RDW: 12.3 % (ref 11.5–15.5)
WBC: 5.8 10*3/uL (ref 4.0–10.5)
nRBC: 0 % (ref 0.0–0.2)

## 2022-08-23 LAB — I-STAT VENOUS BLOOD GAS, ED
Acid-Base Excess: 0 mmol/L (ref 0.0–2.0)
Bicarbonate: 27.3 mmol/L (ref 20.0–28.0)
Calcium, Ion: 1.21 mmol/L (ref 1.15–1.40)
HCT: 45 % (ref 39.0–52.0)
Hemoglobin: 15.3 g/dL (ref 13.0–17.0)
O2 Saturation: 74 %
Patient temperature: 98.5
Potassium: 4.7 mmol/L (ref 3.5–5.1)
Sodium: 127 mmol/L — ABNORMAL LOW (ref 135–145)
TCO2: 29 mmol/L (ref 22–32)
pCO2, Ven: 51.5 mmHg (ref 44–60)
pH, Ven: 7.332 (ref 7.25–7.43)
pO2, Ven: 42 mmHg (ref 32–45)

## 2022-08-23 LAB — PHOSPHORUS: Phosphorus: 4.3 mg/dL (ref 2.5–4.6)

## 2022-08-23 LAB — GLUCOSE, CAPILLARY
Glucose-Capillary: 309 mg/dL — ABNORMAL HIGH (ref 70–99)
Glucose-Capillary: 337 mg/dL — ABNORMAL HIGH (ref 70–99)

## 2022-08-23 LAB — MRSA NEXT GEN BY PCR, NASAL: MRSA by PCR Next Gen: NOT DETECTED

## 2022-08-23 LAB — BETA-HYDROXYBUTYRIC ACID: Beta-Hydroxybutyric Acid: 0.13 mmol/L (ref 0.05–0.27)

## 2022-08-23 LAB — CBG MONITORING, ED
Glucose-Capillary: >600 mg/dL (ref 70–99)
Glucose-Capillary: >600 mg/dL (ref 70–99)
Glucose-Capillary: 495 mg/dL — ABNORMAL HIGH (ref 70–99)

## 2022-08-23 LAB — MAGNESIUM: Magnesium: 1.8 mg/dL (ref 1.7–2.4)

## 2022-08-23 MED ORDER — ONDANSETRON HCL 4 MG/2ML IJ SOLN: 4.0000 mg | Freq: Four times a day (QID) | INTRAMUSCULAR | Status: DC | PRN

## 2022-08-23 MED ORDER — POTASSIUM CHLORIDE 10 MEQ/100ML IV SOLN
10.0000 meq | INTRAVENOUS | Status: AC
  Administered 2022-08-23 (×2): 10 meq via INTRAVENOUS
  Filled 2022-08-23 (×2): qty 100

## 2022-08-23 MED ORDER — INSULIN GLARGINE-YFGN 100 UNIT/ML ~~LOC~~ SOLN
10.0000 [IU] | Freq: Every day | SUBCUTANEOUS | Status: DC
  Administered 2022-08-23: 10 [IU] via SUBCUTANEOUS
  Filled 2022-08-23 (×2): qty 0.1

## 2022-08-23 MED ORDER — SODIUM CHLORIDE 0.9 % IV BOLUS: 1000.0000 mL | Freq: Once | INTRAVENOUS | Status: AC

## 2022-08-23 MED ORDER — SODIUM CHLORIDE 0.9 % IV SOLN: INTRAVENOUS | Status: DC

## 2022-08-23 MED ORDER — ENOXAPARIN SODIUM 40 MG/0.4ML IJ SOSY
40.0000 mg | PREFILLED_SYRINGE | INTRAMUSCULAR | Status: DC
  Administered 2022-08-23: 40 mg via SUBCUTANEOUS
  Filled 2022-08-23: qty 0.4

## 2022-08-23 MED ORDER — DEXTROSE 50 % IV SOLN: 0.0000 mL | INTRAVENOUS | Status: DC | PRN

## 2022-08-23 MED ORDER — INSULIN ASPART 100 UNIT/ML IJ SOLN
0.0000 [IU] | Freq: Three times a day (TID) | INTRAMUSCULAR | Status: DC
  Administered 2022-08-23: 15 [IU] via SUBCUTANEOUS
  Administered 2022-08-24: 4 [IU] via SUBCUTANEOUS
  Administered 2022-08-24: 7 [IU] via SUBCUTANEOUS
  Administered 2022-08-24: 11 [IU] via SUBCUTANEOUS

## 2022-08-23 MED ORDER — INSULIN ASPART 100 UNIT/ML IJ SOLN
10.0000 [IU] | Freq: Once | INTRAMUSCULAR | Status: AC
  Administered 2022-08-23: 10 [IU] via SUBCUTANEOUS

## 2022-08-23 MED ORDER — SODIUM CHLORIDE 0.45 % IV SOLN: INTRAVENOUS | Status: DC

## 2022-08-23 MED ORDER — LIVING WELL WITH DIABETES BOOK
Freq: Once | Status: AC
  Filled 2022-08-23: qty 1

## 2022-08-23 MED ORDER — LACTATED RINGERS IV BOLUS
1000.0000 mL | Freq: Once | INTRAVENOUS | Status: AC
  Administered 2022-08-23: 1000 mL via INTRAVENOUS

## 2022-08-23 MED ORDER — ACETAMINOPHEN 325 MG PO TABS: 650.0000 mg | ORAL_TABLET | Freq: Four times a day (QID) | ORAL | Status: DC | PRN

## 2022-08-23 MED ORDER — DEXTROSE-SODIUM CHLORIDE 5-0.45 % IV SOLN: INTRAVENOUS | Status: DC

## 2022-08-23 MED ORDER — ORAL CARE MOUTH RINSE: 15.0000 mL | OROMUCOSAL | Status: DC | PRN

## 2022-08-23 MED ORDER — INSULIN STARTER KIT- PEN NEEDLES (ENGLISH)
1.0000 | Freq: Once | Status: AC
  Administered 2022-08-24: 1
  Filled 2022-08-23: qty 1

## 2022-08-23 MED ORDER — BICTEGRAVIR-EMTRICITAB-TENOFOV 50-200-25 MG PO TABS
1.0000 | ORAL_TABLET | Freq: Every day | ORAL | Status: DC
  Administered 2022-08-24: 1 via ORAL
  Filled 2022-08-23: qty 1

## 2022-08-23 MED ORDER — ACETAMINOPHEN 650 MG RE SUPP: 650.0000 mg | Freq: Four times a day (QID) | RECTAL | Status: DC | PRN

## 2022-08-23 MED ORDER — ONDANSETRON HCL 4 MG PO TABS: 4.0000 mg | ORAL_TABLET | Freq: Four times a day (QID) | ORAL | Status: DC | PRN

## 2022-08-23 MED ORDER — LACTATED RINGERS IV BOLUS
20.0000 mL/kg | Freq: Once | INTRAVENOUS | Status: DC
Start: 2022-08-23 — End: 2022-08-23

## 2022-08-23 MED ORDER — INSULIN REGULAR(HUMAN) IN NACL 100-0.9 UT/100ML-% IV SOLN: INTRAVENOUS | Status: DC

## 2022-08-23 MED ORDER — FREESTYLE LIBRE 3 SENSOR MISC: 4 refills | Status: DC

## 2022-08-23 MED ORDER — CHLORHEXIDINE GLUCONATE CLOTH 2 % EX PADS
6.0000 | MEDICATED_PAD | Freq: Every day | CUTANEOUS | Status: DC
  Administered 2022-08-23: 6 via TOPICAL

## 2022-08-23 NOTE — ED Notes (Signed)
Called [facility] for transport at 12:59.

## 2022-08-23 NOTE — ED Notes (Signed)
[facility] at bedside 

## 2022-08-23 NOTE — Progress Notes (Addendum)
Plan of Care Note for accepted transfer   Patient: Matthew Fitzpatrick MRN: [id number]   DOA: 08/23/2022  Facility requesting transfer: [facility].  Requesting Provider: Sherian Maroon, [facility]. Reason for transfer: DKA. Facility course:  Per EDP:  "Matthew Fitzpatrick is a 42 y.o. male.   HPI    42 year old male presents emergency department with complaints of elevated blood sugar.  Patient was seen by his primary care earlier today and sent to the emergency department due to concerns for blood glucose being greater than 600 as well as prior A1c being greater than 14.  Patient with known history of diabetes controlled by diet/exercise.  Patient states he has never been on any medications for diabetes.  States that he has felt some increased thirst, increased urination over the past 3 months with no acute change over the past few days and otherwise feels at baseline.  States he is only here because his primary care sent him here.  Denies any chest pain, shortness of breath, fever, chills, abdominal pain, nausea, vomiting, change in bowel habits.   Past medical history significant for diabetes mellitus type 2, dyslipidemia, HIV on Biktarvy with recent CD4 count of 1420 with CD4 percent of T cells 42, latent TB, syphilis, obesity, GERD, hearing loss, depression, anxiety"  Lab work: POC CBG, ED [[id number]] (Abnormal)   Collected: 08/23/22 1146   Updated: 08/23/22 1159   Specimen Type: Blood    Glucose-Capillary 495 High  mg/dL  CBG monitoring, ED [[id number]] (Abnormal)   Collected: 08/23/22 1021   Updated: 08/23/22 1023    Glucose-Capillary >600 High Panic  mg/dL  Comprehensive metabolic panel [[id number]] (Abnormal)   Collected: 08/23/22 0935   Updated: 08/23/22 1008   Specimen Type: Blood   Specimen Source: Vein    Sodium 123 Low  mmol/L   Potassium 4.6 mmol/L   Chloride 88 Low  mmol/L   CO2 24 mmol/L   Glucose, Bld 791 High Panic  mg/dL   BUN 13 mg/dL   Creatinine,  Ser 2.84 mg/dL   Calcium 8.9 mg/dL   Total Protein 7.6 g/dL   Albumin 3.6 g/dL   AST 18 U/L   ALT 14 U/L   Alkaline Phosphatase 141 High  U/L   Total Bilirubin 0.7 mg/dL   GFR, Estimated >13 mL/min   Anion gap 11  Urinalysis, Microscopic (reflex) [[id number]]   Collected: 08/23/22 0928   Updated: 08/23/22 0950    RBC / HPF 0-5 RBC/hpf   WBC, UA NONE SEEN WBC/hpf   Bacteria, UA NONE SEEN   Squamous Epithelial / HPF NONE SEEN /HPF  Urinalysis, Routine w reflex microscopic -Urine, Clean Catch [[id number]] (Abnormal)   Collected: 08/23/22 0928   Updated: 08/23/22 0950   Specimen Source: Urine, Clean Catch    Color, Urine YELLOW   APPearance CLEAR   Specific Gravity, Urine 1.010   pH 6.5   Glucose, UA >=500 Abnormal  mg/dL   Hgb urine dipstick NEGATIVE   Bilirubin Urine NEGATIVE   Ketones, ur NEGATIVE mg/dL   Protein, ur NEGATIVE mg/dL   Nitrite NEGATIVE   Leukocytes,Ua NEGATIVE  I-Stat venous blood gas, ([facility], [facility], [facility]) [[id number]] (Abnormal)   Collected: 08/23/22 0939   Updated: 08/23/22 0940   Specimen Type: Blood    pH, Ven 7.332   pCO2, Ven 51.5 mmHg   pO2, Ven 42 mmHg   Bicarbonate 27.3 mmol/L   TCO2 29 mmol/L   O2 Saturation 74 %   Acid-Base Excess 0.0 mmol/L  Sodium 127 Low  mmol/L   Potassium 4.7 mmol/L   Calcium, Ion 1.21 mmol/L   HCT 45.0 %   Hemoglobin 15.3 g/dL   Patient temperature 91.4 F   Sample type VENOUS  CBC [[id number]]   Collected: 08/23/22 0933   Updated: 08/23/22 0939   Specimen Type: Blood   Specimen Source: Vein    WBC 5.8 K/uL   RBC 4.51 MIL/uL   Hemoglobin 14.0 g/dL   HCT 08.6 %   MCV 57.8 fL   MCH 31.0 pg   MCHC 34.2 g/dL   RDW 46.9 %   Platelets 236 K/uL   nRBC 0.0 %  POC CBG, ED [[id number]] (Abnormal)   Collected: 08/23/22 0919   Updated: 08/23/22 0922   Specimen Type: Blood    Glucose-Capillary >600 High Panic  mg/dL    Plan of care: The patient is accepted for admission to [facility] unit, at [facility] to  continue HHS treatment  Author: Bobette Mo, MD 08/23/2022  Check [email or web address] for on-call coverage.  Nursing staff, Please call [facility] Admits & Consults System-Wide number on Amion as soon as patient's arrival, so appropriate admitting provider can evaluate the pt.

## 2022-08-23 NOTE — ED Notes (Signed)
Pt's CBG read HI. PA notified.

## 2022-08-23 NOTE — Progress Notes (Signed)
Subjective:     Patient ID: Matthew Fitzpatrick, male    DOB: 1980-11-04, 42 y.o.   MRN: [id number]  Chief Complaint  Patient presents with   Diabetes    Here for follow up    HPI  History of Present Illness          Patient presents today with sign language interpreter. He was recently seen by his ID specialist for HIV follow up and complained of nocturia.  A1C was performed and was >14.  Patient reports urinary frequency/urgency and increased thirst.     Health Maintenance Due  Topic Date Due   FOOT EXAM  Never done   Diabetic kidney evaluation - Urine ACR  Never done   DTaP/Tdap/Td (1 - Tdap) Never done   Medicare Annual Wellness (AWV)  08/14/2018   COVID-19 Vaccine (5 - 2023-24 season) 02/23/2022    Past Medical History:  Diagnosis Date   Acute conjunctivitis 02/12/2014   Acute gastroenteritis 08/12/2015   ANXIETY 03/02/2006   Qualifier: Diagnosis of  By: Orvan Falconer MD, [name]     Bronchitis 01/26/2011   CAP (community acquired pneumonia)    CHICKENPOX, HX OF 06/20/2006   Annotation: age 13 Qualifier: Diagnosis of  By: Orvan Falconer MD, [name]     Chlamydia infection 03/29/2018   COUGH, CHRONIC 12/23/2008   Qualifier: Diagnosis of  By: Orvan Falconer MD, [name]     Deaf    DEAFNESS, 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   Qualifier: Diagnosis of  By: Orvan Falconer MD, [name]     DEPRESSION 03/02/2006   Qualifier: Diagnosis of  By: Orvan Falconer MD, [name]     diabetes type 2 08/18/2020   Dyslipidemia 07/02/2012   ERECTILE DYSFUNCTION, 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   Qualifier: Diagnosis of  By: Orvan Falconer MD, [name]     FACIAL RASH 11/17/2008   Qualifier: Diagnosis of  By: Orvan Falconer MD, [name]     Fatty liver    GERD 07/20/2009   Qualifier: Diagnosis of  By: Orvan Falconer MD, [name]     GERD (gastroesophageal reflux disease)    HIV disease (HCC)    Monkeypox    Nocturia 08/16/2022   Obesity (BMI 30.0-34.9) 04/20/2016   PEDICULOSIS PUBIS (PUBIC LOUSE) 06/20/2006   Annotation: 4/06 Qualifier: Diagnosis of  By: Orvan Falconer  MD, [name]     Poor dentition 09/21/2016   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, 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 Qualifier: Diagnosis of  By: Orvan Falconer MD, [name]     Pulmonary nodule    SINUSITIS, CHRONIC 02/18/2009   Qualifier: Diagnosis of  By: Daiva Eves MD, [name]     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   Urinary frequency 04/06/2009   Qualifier: Diagnosis of  By: Orvan Falconer MD, [name]      Past Surgical History:  Procedure Laterality Date   COLONOSCOPY     ESOPHAGOGASTRODUODENOSCOPY     FLEXIBLE SIGMOIDOSCOPY     THROAT SURGERY     UMBILICAL HERNIA REPAIR      Family History  Problem Relation Age of Onset   Headache Mother    Hypertension Mother    Heart attack Father 80   Sudden Cardiac Death Neg Hx    Colon cancer Neg Hx    Liver disease Neg Hx    Esophageal cancer Neg Hx    Rectal cancer Neg Hx    Stomach cancer Neg Hx     Social History   Socioeconomic History   Marital status: Single    Spouse name: Not on file   Number of children: 0  Years of education: Not on file   Highest education level: Not on file  Occupational History   Not on file  Tobacco Use   Smoking status: Never    Passive exposure: Never   Smokeless tobacco: Never  Vaping Use   Vaping Use: Never used  Substance and Sexual Activity   Alcohol use: No    Alcohol/week: 0.0 standard drinks of alcohol   Drug use: No   Sexual activity: Not Currently    Partners: Male    Comment: declined condoms  Other Topics Concern   Not on file  Social History Narrative   Lives alone   Mom lives with his sister in [place]   No children   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   Outpatient Medications Prior to Visit  Medication Sig Dispense Refill   bictegravir-emtricitabine-tenofovir AF (BIKTARVY) 50-200-25 MG TABS tablet Take 1 tablet by mouth daily. 30 tablet 11   bisacodyl (DULCOLAX) 10 MG suppository Place 10 mg rectally as needed for moderate constipation.     brimonidine (ALPHAGAN) 0.15 % ophthalmic solution Apply to eye.     dorzolamide (TRUSOPT) 2 % ophthalmic solution Apply to eye.     folic acid (FOLVITE) 1 MG tablet Take by mouth.     lactulose (CHRONULAC) 10 GM/15ML solution Take 15 mLs (10 g total) by mouth 2 (two) times daily as needed for mild constipation. 473 mL 5   methotrexate (RHEUMATREX) 2.5 MG tablet Take by mouth.     timolol (TIMOPTIC-XR) 0.25 % ophthalmic gel-forming Apply to eye.     acyclovir (ZOVIRAX) 800 MG tablet Take by mouth. (Patient not taking: Reported on 08/23/2022)     atorvastatin (LIPITOR) 20 MG tablet Take 1 tablet (20 mg total) by mouth daily. (Patient not taking: Reported on 08/23/2022) 30 tablet 11    No Known Allergies  ROS See HPI    Objective:    Physical Exam Constitutional:      General: He is not in acute distress.    Appearance: He is well-developed.  HENT:     Head: Normocephalic and atraumatic.  Cardiovascular:     Rate and Rhythm: Normal rate and regular rhythm.     Heart sounds: No murmur heard. Pulmonary:     Effort: Pulmonary effort is normal. No respiratory distress.     Breath sounds: Normal breath sounds. No wheezing or rales.  Skin:    General: Skin is warm.     Comments: Skin appears dry overall  Neurological:     Mental Status: He is alert and oriented to person, place, and time.  Psychiatric:        Behavior: Behavior normal.        Thought Content: Thought content normal.      BP 120/72 (BP Location: Right Arm, Patient Position: Sitting, Cuff Size: Small)   Pulse 73   Temp 97.9 F (36.6 C) (Oral)   Wt 211 lb (95.7 kg)   SpO2  97%   BMI 32.08 kg/m  Wt Readings from Last 3 Encounters:  08/23/22 211 lb (  95.7 kg)  08/16/22 217 lb (98.4 kg)  12/22/21 241 lb (109.3 kg)       Assessment & Plan:   Problem List Items Addressed This Visit       Unprioritized   Uncontrolled type 2 diabetes mellitus with hyperglycemia (HCC) - Primary    Glucose reading today in the office is 600.  I have advised the patient that at this range, it would be best for him to go to the ER for further evaluation.  I suspect that he is very dehydrated as well.  He will follow back up after discharge for further management. In the meantime, I will send glucometer supplies.   I gave report to Abby (ED provider on duty at the [facility] ED).  Patient was walked down to the ED by CMA with Sign Language interpreter.       20 minutes spent on today's visit.  Time was spent counseling patient on diabetes and coordinating care.  I have discontinued Darsh L. Kinker "[name]"'s bisacodyl, lactulose, brimonidine, dorzolamide, folic acid, methotrexate, and timolol. I am also having him start on FreeStyle Libre 3 Sensor. Additionally, I am having him maintain his acyclovir, Biktarvy, and atorvastatin.  Meds ordered this encounter  Medications   Continuous Glucose Sensor (FREESTYLE LIBRE 3 SENSOR) MISC    Sig: Apply one sensor to upper arm every 14 days    Dispense:  12 each    Refill:  4    Order Specific Question:   Supervising Provider    Answer:   Danise Edge A [[id number]]

## 2022-08-23 NOTE — H&P (Signed)
History and Physical    Patient: Matthew Fitzpatrick MVH:[id number] DOB: 1980-05-04 DOA: 08/23/2022 DOS: the patient was seen and examined on 08/23/2022 PCP: Sandford Craze, NP  Patient coming from: Home  Chief Complaint:  Chief Complaint  Patient presents with   Hyperglycemia   HPI: Matthew Fitzpatrick is a 42 y.o. male with medical history significant of conjunctivitis, gastroenteritis, anxiety, depression, community-acquired pneumonia, varicella-zoster, chlamydia infection, chronic cough, congenital deafness, type 2 diabetes, dyslipidemia, ED, facial rash, fatty liver disease, GERD, HIV disease on Biktarvy with recent CD4 count of 1420 with CD4 percent of T cells 42, monkeypox, class I obesity, pediculosis, poor dentition, proctitis, pulmonary nodule, chronic sinusitis, syphilis who presented to the emergency department with complaints of hyperglycemia, polyuria and polydipsia.  Has been previously treated his diabetes with diet and exercise. He denied fever, chills, rhinorrhea, sore throat, wheezing or hemoptysis.  No chest pain, palpitations, diaphoresis, PND, orthopnea or pitting edema of the lower extremities.  No abdominal pain, nausea, emesis, diarrhea, constipation, melena or hematochezia.  No flank pain, dysuria, frequency or hematuria.  No polyuria, polydipsia, polyphagia or blurred vision.   ED course: Initial vital signs were temperature 98.5 F, pulse 75, respiration 18, BP 126/80 mmHg O2 sat 93% on room air.  Patient received LR 1000 mL bolus, NS 1000 mL bolus and 10 units of insulin SQ.  Lab work: Urinalysis showed glucosuria greater than 500 mg/dL.  CBC was normal.  I-STAT venous blood gas was normal except for sodium 127 mmol/L.  Normal beta hydroxybutyric acid.  Glucose 791, magnesium 1.9 phosphorus 4.3 mg/dL.  Alkaline phosphatase 141 units/L.  The rest of the chemistry is normal after sodium/chloride correction.   Review of Systems: As mentioned in the history of  present illness. All other systems reviewed and are negative. Past Medical History:  Diagnosis Date   Acute conjunctivitis 02/12/2014   Acute gastroenteritis 08/12/2015   ANXIETY 03/02/2006   Qualifier: Diagnosis of  By: Orvan Falconer MD, [name]     Bronchitis 01/26/2011   CAP (community acquired pneumonia)    CHICKENPOX, HX OF 06/20/2006   Annotation: age 36 Qualifier: Diagnosis of  By: Orvan Falconer MD, [name]     Chlamydia infection 03/29/2018   COUGH, CHRONIC 12/23/2008   Qualifier: Diagnosis of  By: Orvan Falconer MD, [name]     Deaf    DEAFNESS, 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   Qualifier: Diagnosis of  By: Orvan Falconer MD, [name]     DEPRESSION 03/02/2006   Qualifier: Diagnosis of  By: Orvan Falconer MD, [name]     diabetes type 2 08/18/2020   Dyslipidemia 07/02/2012   ERECTILE DYSFUNCTION, 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   Qualifier: Diagnosis of  By: Orvan Falconer MD, [name]     FACIAL RASH 11/17/2008   Qualifier: Diagnosis of  By: Orvan Falconer MD, [name]     Fatty liver    GERD 07/20/2009   Qualifier: Diagnosis of  By: Orvan Falconer MD, [name]     GERD (gastroesophageal reflux disease)    HIV disease (HCC)    Monkeypox    Nocturia 08/16/2022   Obesity (BMI 30.0-34.9) 04/20/2016   PEDICULOSIS PUBIS (PUBIC LOUSE) 06/20/2006   Annotation: 4/06 Qualifier: Diagnosis of  By: Orvan Falconer MD, [name]     Poor dentition 09/21/2016   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 He has never been exposed to tobacco smoke. He has never used smokeless tobacco.  He reports that he does not drink alcohol and does not use drugs.  No Known Allergies  Family History  Problem Relation Age of Onset   Headache Mother    Hypertension Mother    Heart attack Father 88   Sudden Cardiac Death Neg Hx    Colon cancer Neg Hx    Liver disease Neg Hx    Esophageal cancer Neg Hx    Rectal cancer Neg Hx    Stomach cancer Neg Hx     Prior to Admission medications   Medication Sig Start Date End Date Taking? Authorizing Provider  bictegravir-emtricitabine-tenofovir AF (BIKTARVY) 50-200-25 MG TABS tablet Take 1 tablet by mouth daily. 08/16/22  Yes Daiva Eves, Lisette Grinder, MD  Continuous Glucose Sensor (FREESTYLE LIBRE 3 SENSOR) MISC Apply one sensor to upper arm every 14 days 08/23/22  Yes Sandford Craze, NP  atorvastatin (LIPITOR) 20 MG tablet Take 1 tablet (20 mg total) by mouth daily. Patient not taking: Reported on 08/23/2022 08/16/22   Daiva Eves, Lisette Grinder, MD    Physical Exam: Vitals:   08/23/22 1215 08/23/22 1300 08/23/22 1316 08/23/22 1330  BP: 107/67 102/66  116/77  Pulse:    70  Resp: 19 18  (!) 22  Temp:   98.5 F (36.9 C)   TempSrc:   Oral   SpO2:    94%  Weight:      Height:       Physical Exam Constitutional:      General: He is awake. He is not in acute distress.    Appearance: He is obese.  HENT:     Head: Normocephalic.     Nose: No rhinorrhea.     Mouth/Throat:     Mouth: Mucous membranes are dry.  Eyes:     General: No scleral icterus.    Pupils: Pupils are equal, round, and reactive to light.  Neck:     Vascular: No JVD.  Cardiovascular:     Rate and Rhythm: Normal rate and regular rhythm.     Heart sounds: S1 normal and S2 normal.  Pulmonary:     Effort: Pulmonary effort is normal.     Breath sounds: Normal breath sounds. No wheezing, rhonchi or rales.  Abdominal:     General: Bowel sounds are normal. There is no distension.     Palpations: Abdomen is soft.     Tenderness: There is no abdominal tenderness. There is  no guarding.  Musculoskeletal:     Cervical back: Neck supple.     Right lower leg: No edema.     Left lower leg: No edema.  Skin:    General: Skin is warm and dry.  Neurological:     General: No focal deficit present.     Mental Status: He is alert and oriented to person, place, and time.  Psychiatric:        Mood and Affect: Mood normal.        Behavior: Behavior normal. Behavior is cooperative.   Data Reviewed:  Results are  pending, will review when available.  Assessment and Plan: Principal Problem:   Uncontrolled type 2 diabetes mellitus with hyperglycemia (HCC) Presenting with:   Hyperosmolar hyperglycemic state (HHS) (HCC) Observation/stepdown. Initially NPO. May transition to diet now. Continue IV fluids. Begin Semglee 10 units SQ daily. CBG monitoring before meals and bedtime. Regular insulin sliding scale with Replace electrolytes as needed. Consult diabetes coordinator. Follow-up with PCP and endocrinology as an outpatient.  Active Problems:   Human immunodeficiency virus (HIV) disease (HCC) Continue Biktarvy 1 tablet p.o. daily. Follow-up as scheduled at the ID clinic.    GERD (gastroesophageal reflux disease) Antiacid, H2 blocker or PPI as needed.    Dyslipidemia Currently off atorvastatin. Follow-up with primary care provider.    Obesity, class I (BMI 30.0-34.9) Current BMI 31.58 kg/m. Would benefit from lifestyle modifications. Follow-up primary care provider.     Advance Care Planning:   Code Status: Full Code   Consults:   Family Communication:   Severity of Illness: The appropriate patient status for this patient is OBSERVATION. Observation status is judged to be reasonable and necessary in order to provide the required intensity of service to ensure the patient's safety. The patient's presenting symptoms, physical exam findings, and initial radiographic and laboratory data in the context of their medical condition is felt to place them  at decreased risk for further clinical deterioration. Furthermore, it is anticipated that the patient will be medically stable for discharge from the hospital within 2 midnights of admission.   Author: Bobette Mo, MD 08/23/2022 2:30 PM  For on call review [email or web address].   This document was prepared using Dragon voice recognition software and may contain some unintended transcription errors.

## 2022-08-23 NOTE — Assessment & Plan Note (Addendum)
Glucose reading today in the office is 600.  I have advised the patient that at this range, it would be best for him to go to the ER for further evaluation.  I suspect that he is very dehydrated as well.  He will follow back up after discharge for further management. In the meantime, I will send glucometer supplies.   I gave report to Abby (ED provider on duty at the [facility] ED).  Patient was walked down to the ED by CMA with Sign Language interpreter.

## 2022-08-23 NOTE — ED Triage Notes (Signed)
Sent here by PCP for elevated blood sugar over 600 & hemoglobin A1C 14. C/o fatigue, increased thirst and urinary frequency x 3 months.  Pt is deaf

## 2022-08-23 NOTE — ED Notes (Signed)
Report has been called. Pt resting in bed.

## 2022-08-23 NOTE — ED Provider Notes (Signed)
[place] [facility] Provider Note   CSN: [id number] Arrival date & time: 08/23/22  0865     History  Chief Complaint  Patient presents with   Hyperglycemia    Matthew Fitzpatrick is a 42 y.o. male.  HPI   42 year old male presents emergency department with complaints of elevated blood sugar.  Patient was seen by his primary care earlier today and sent to the emergency department due to concerns for blood glucose being greater than 600 as well as prior A1c being greater than 14.  Patient with known history of diabetes controlled by diet/exercise.  Patient states he has never been on any medications for diabetes.  States that he has felt some increased thirst, increased urination over the past 3 months with no acute change over the past few days and otherwise feels at baseline.  States he is only here because his primary care sent him here.  Denies any chest pain, shortness of breath, fever, chills, abdominal pain, nausea, vomiting, change in bowel habits.  Past medical history significant for diabetes mellitus type 2, dyslipidemia, HIV on Biktarvy with recent CD4 count of 1420 with CD4 percent of T cells 42, latent TB, syphilis, obesity, GERD, hearing loss, depression, anxiety  Home Medications Prior to Admission medications   Medication Sig Start Date End Date Taking? Authorizing Provider  bictegravir-emtricitabine-tenofovir AF (BIKTARVY) 50-200-25 MG TABS tablet Take 1 tablet by mouth daily. 08/16/22  Yes Daiva Eves, Lisette Grinder, MD  Continuous Glucose Sensor (FREESTYLE LIBRE 3 SENSOR) MISC Apply one sensor to upper arm every 14 days 08/23/22  Yes Sandford Craze, NP  atorvastatin (LIPITOR) 20 MG tablet Take 1 tablet (20 mg total) by mouth daily. Patient not taking: Reported on 08/23/2022 08/16/22   Daiva Eves, Lisette Grinder, MD      Allergies    Patient has no known allergies.    Review of Systems   Review of Systems  All other systems reviewed  and are negative.   Physical Exam Updated Vital Signs BP 116/77   Pulse 70   Temp 98.5 F (36.9 C) (Oral)   Resp (!) 22   Ht 5\' 8"  (1.727 m)   Wt 96.2 kg   SpO2 94%   BMI 32.23 kg/m  Physical Exam Vitals and nursing note reviewed.  Constitutional:      General: He is not in acute distress.    Appearance: He is well-developed.  HENT:     Head: Normocephalic and atraumatic.  Eyes:     Conjunctiva/sclera: Conjunctivae normal.  Cardiovascular:     Rate and Rhythm: Normal rate and regular rhythm.     Heart sounds: No murmur heard. Pulmonary:     Effort: Pulmonary effort is normal. No respiratory distress.     Breath sounds: Normal breath sounds.  Abdominal:     Palpations: Abdomen is soft.     Tenderness: There is no abdominal tenderness. There is no guarding.  Musculoskeletal:        General: No swelling.     Cervical back: Neck supple.     Right lower leg: No edema.     Left lower leg: No edema.  Skin:    General: Skin is warm and dry.     Capillary Refill: Capillary refill takes less than 2 seconds.  Neurological:     Mental Status: He is alert.  Psychiatric:        Mood and Affect: Mood normal.     ED Results /  Procedures / Treatments   Labs (all labs ordered are listed, but only abnormal results are displayed) Labs Reviewed  URINALYSIS, ROUTINE W REFLEX MICROSCOPIC - Abnormal; Notable for the following components:      Result Value   Glucose, UA >=500 (*)    All other components within normal limits  COMPREHENSIVE METABOLIC PANEL - Abnormal; Notable for the following components:   Sodium 123 (*)    Chloride 88 (*)    Glucose, Bld 791 (*)    Alkaline Phosphatase 141 (*)    All other components within normal limits  GLUCOSE, CAPILLARY - Abnormal; Notable for the following components:   Glucose-Capillary 309 (*)    All other components within normal limits  I-STAT VENOUS BLOOD GAS, ED - Abnormal; Notable for the following components:   Sodium 127 (*)     All other components within normal limits  CBG MONITORING, ED - Abnormal; Notable for the following components:   Glucose-Capillary >600 (*)    All other components within normal limits  CBG MONITORING, ED - Abnormal; Notable for the following components:   Glucose-Capillary >600 (*)    All other components within normal limits  CBG MONITORING, ED - Abnormal; Notable for the following components:   Glucose-Capillary 495 (*)    All other components within normal limits  MRSA NEXT GEN BY PCR, NASAL  CBC  BETA-HYDROXYBUTYRIC ACID  URINALYSIS, MICROSCOPIC (REFLEX)  MAGNESIUM  PHOSPHORUS  BASIC METABOLIC PANEL  BASIC METABOLIC PANEL  BASIC METABOLIC PANEL  BASIC METABOLIC PANEL  OSMOLALITY    EKG None  Radiology No results found.  Procedures Procedures    Medications Ordered in ED Medications  Oral care mouth rinse (has no administration in time range)  Chlorhexidine Gluconate Cloth 2 % PADS 6 each (6 each Topical Given 08/23/22 1436)  enoxaparin (LOVENOX) injection 40 mg (has no administration in time range)  acetaminophen (TYLENOL) tablet 650 mg (has no administration in time range)    Or  acetaminophen (TYLENOL) suppository 650 mg (has no administration in time range)  ondansetron (ZOFRAN) tablet 4 mg (has no administration in time range)    Or  ondansetron (ZOFRAN) injection 4 mg (has no administration in time range)  insulin regular, human (MYXREDLIN) 100 units/ 100 mL infusion (has no administration in time range)  dextrose 50 % solution 0-50 mL (has no administration in time range)  0.9 %  sodium chloride infusion (has no administration in time range)  dextrose 5 % and 0.45 % NaCl infusion (has no administration in time range)  potassium chloride 10 mEq in 100 mL IVPB (has no administration in time range)  bictegravir-emtricitabine-tenofovir AF (BIKTARVY) 50-200-25 MG per tablet 1 tablet (has no administration in time range)  sodium chloride 0.9 % bolus 1,000 mL  (has no administration in time range)  lactated ringers bolus 1,000 mL (0 mLs Intravenous Stopped 08/23/22 1054)  lactated ringers bolus 1,000 mL (0 mLs Intravenous Stopped 08/23/22 1220)  insulin aspart (novoLOG) injection 10 Units (10 Units Subcutaneous Given 08/23/22 1001)    ED Course/ Medical Decision Making/ A&P Clinical Course as of 08/23/22 1510  Wed Aug 23, 2022  1313 Consulted Dr. Robb Matar of hospitalist who agreed with admission and assume further treatment/care. [CR]    Clinical Course User Index [CR] Peter Garter, PA                             Medical Decision Making Amount and/or  Complexity of Data Reviewed Labs: ordered.  Risk Prescription drug management. Decision regarding hospitalization.   This patient presents to the ED for concern of elevated blood sugar, this involves an extensive number of treatment options, and is a complaint that carries with it a high risk of complications and morbidity.  The differential diagnosis includes hyperglycemia, DKA, HHS   Co morbidities that complicate the patient evaluation  See HPI   Additional history obtained:  Additional history obtained from EMR External records from outside source obtained and reviewed including hospital records   Lab Tests:  I Ordered, and personally interpreted labs.  The pertinent results include: Initial CBG of greater than 6.  Patient with hyponatremia of 123 of which when corrected with blood glucose is 140.  Glucose of 791.  Chloride of 88.  No transaminitis.  No renal dysfunction.  pH within normal limits.  No leukocytosis.  Evidence of anemia.  Placed within range.  UA significant for greater than 500 glucose but without ketones.   Imaging Studies ordered:  N/a   Cardiac Monitoring: / EKG:  The patient was maintained on a cardiac monitor.  I personally viewed and interpreted the cardiac monitored which showed an underlying rhythm of: Sinus rhythm   Consultations  Obtained:  N/a   Problem List / ED Course / Critical interventions / Medication management  Hyperglycemia I ordered medication including 2 L of lactated Ringer's, 10 units of NovoLog   Reevaluation of the patient after these medicines showed that the patient improved I have reviewed the patients home medicines and have made adjustments as needed   Social Determinants of Health:  Denies tobacco, illicit drug use   Test / Admission - Considered:  Hyperglycemia Vitals signs within normal range and stable throughout visit. Laboratory studies significant for: See above 42 year old male presents emergency department with complaints of elevated blood glucose.  Patient without any acute complaints but with months-long history of polydipsia, polyuria.  Patient found with evidence of hyperglycemia without evidence of DKA or obvious HHS.  Patient known diabetic but not previously on medications.  Given patient's drastically elevated blood glucose from 791 to around the 300s approximately 1 week ago without ability for evaluation of at home blood glucose/close follow-up in the outpatient setting, admission deemed most appropriate.  Consulted hospitalist who agreed with admission.  Treatment plan discussed at length with patient and he acknowledged understanding was agreeable to said plan.  Patient stable upon admission.         Final Clinical Impression(s) / ED Diagnoses Final diagnoses:  Hyperglycemia    Rx / DC Orders ED Discharge Orders     None         Peter Garter, [place] 08/23/22 1510    Virgina Norfolk, DO 08/24/22 0915

## 2022-08-23 NOTE — Progress Notes (Addendum)
Face to Face interpreter service [facility] [phone]. Interpreter on day shift Melinda Crutch. If assistance needed refer to traditional interpreter line [phone] .

## 2022-08-24 ENCOUNTER — Other Ambulatory Visit (HOSPITAL_COMMUNITY): Payer: Self-pay

## 2022-08-24 DIAGNOSIS — E1100 Type 2 diabetes mellitus with hyperosmolarity without nonketotic hyperglycemic-hyperosmolar coma (NKHHC): Secondary | ICD-10-CM | POA: Diagnosis not present

## 2022-08-24 LAB — COMPREHENSIVE METABOLIC PANEL
ALT: 12 U/L (ref 0–44)
AST: 13 U/L — ABNORMAL LOW (ref 15–41)
Albumin: 3.2 g/dL — ABNORMAL LOW (ref 3.5–5.0)
Alkaline Phosphatase: 105 U/L (ref 38–126)
Anion gap: 6 (ref 5–15)
BUN: 11 mg/dL (ref 6–20)
CO2: 26 mmol/L (ref 22–32)
Calcium: 8.8 mg/dL — ABNORMAL LOW (ref 8.9–10.3)
Chloride: 101 mmol/L (ref 98–111)
Creatinine, Ser: 0.83 mg/dL (ref 0.61–1.24)
GFR, Estimated: >60 mL/min (ref >60)
Glucose, Bld: 291 mg/dL — ABNORMAL HIGH (ref 70–99)
Potassium: 3.6 mmol/L (ref 3.5–5.1)
Sodium: 133 mmol/L — ABNORMAL LOW (ref 135–145)
Total Bilirubin: 0.6 mg/dL (ref 0.3–1.2)
Total Protein: 6.6 g/dL (ref 6.5–8.1)

## 2022-08-24 LAB — GLUCOSE, CAPILLARY
Glucose-Capillary: 273 mg/dL — ABNORMAL HIGH (ref 70–99)
Glucose-Capillary: 298 mg/dL — ABNORMAL HIGH (ref 70–99)
Glucose-Capillary: 236 mg/dL — ABNORMAL HIGH (ref 70–99)
Glucose-Capillary: 157 mg/dL — ABNORMAL HIGH (ref 70–99)

## 2022-08-24 LAB — CBC
HCT: 42.8 % (ref 39.0–52.0)
Hemoglobin: 14.3 g/dL (ref 13.0–17.0)
MCH: 31 pg (ref 26.0–34.0)
MCHC: 33.4 g/dL (ref 30.0–36.0)
MCV: 92.6 fL (ref 80.0–100.0)
Platelets: 232 10*3/uL (ref 150–400)
RBC: 4.62 MIL/uL (ref 4.22–5.81)
RDW: 12.5 % (ref 11.5–15.5)
WBC: 7.7 10*3/uL (ref 4.0–10.5)
nRBC: 0 % (ref 0.0–0.2)

## 2022-08-24 LAB — BASIC METABOLIC PANEL
Anion gap: 7 (ref 5–15)
BUN: 9 mg/dL (ref 6–20)
CO2: 24 mmol/L (ref 22–32)
Calcium: 8.4 mg/dL — ABNORMAL LOW (ref 8.9–10.3)
Chloride: 101 mmol/L (ref 98–111)
Creatinine, Ser: 0.78 mg/dL (ref 0.61–1.24)
GFR, Estimated: >60 mL/min (ref >60)
Glucose, Bld: 254 mg/dL — ABNORMAL HIGH (ref 70–99)
Potassium: 3.4 mmol/L — ABNORMAL LOW (ref 3.5–5.1)
Sodium: 132 mmol/L — ABNORMAL LOW (ref 135–145)

## 2022-08-24 MED ORDER — POTASSIUM CHLORIDE CRYS ER 20 MEQ PO TBCR
40.0000 meq | EXTENDED_RELEASE_TABLET | Freq: Once | ORAL | Status: AC
  Administered 2022-08-24: 40 meq via ORAL
  Filled 2022-08-24: qty 2

## 2022-08-24 MED ORDER — ACCU-CHEK SOFTCLIX LANCET DEV KIT: 1.0000 | PACK | Freq: Three times a day (TID) | 0 refills | Status: AC

## 2022-08-24 MED ORDER — BASAGLAR KWIKPEN 100 UNIT/ML ~~LOC~~ SOPN
20.0000 [IU] | PEN_INJECTOR | Freq: Every day | SUBCUTANEOUS | 0 refills | Status: DC
  Filled 2022-08-24: qty 15, 75d supply, fill #0

## 2022-08-24 MED ORDER — ACCU-CHEK GUIDE W/DEVICE KIT: 1.0000 | PACK | Freq: Three times a day (TID) | 0 refills | Status: DC

## 2022-08-24 MED ORDER — ACCU-CHEK SOFTCLIX LANCETS MISC
1.0000 | Freq: Three times a day (TID) | 0 refills | Status: DC
  Filled 2022-08-24: qty 100, 33d supply, fill #0

## 2022-08-24 MED ORDER — ONDANSETRON HCL 4 MG PO TABS
4.0000 mg | ORAL_TABLET | Freq: Four times a day (QID) | ORAL | 0 refills | Status: DC | PRN
  Filled 2022-08-24: qty 20, 5d supply, fill #0

## 2022-08-24 MED ORDER — INSULIN GLARGINE-YFGN 100 UNIT/ML ~~LOC~~ SOLN
20.0000 [IU] | Freq: Every day | SUBCUTANEOUS | Status: DC
  Administered 2022-08-24: 20 [IU] via SUBCUTANEOUS
  Filled 2022-08-24: qty 0.2

## 2022-08-24 MED ORDER — INSULIN PEN NEEDLE 31G X 5 MM MISC
0 refills | Status: DC
  Filled 2022-08-24: qty 100, 90d supply, fill #0

## 2022-08-24 MED ORDER — METFORMIN HCL 500 MG PO TABS
500.0000 mg | ORAL_TABLET | Freq: Two times a day (BID) | ORAL | 0 refills | Status: DC
  Filled 2022-08-24: qty 60, 30d supply, fill #0

## 2022-08-24 MED ORDER — BLOOD GLUCOSE TEST VI STRP: 1.0000 | ORAL_STRIP | Freq: Three times a day (TID) | 0 refills | Status: AC

## 2022-08-24 MED ORDER — BLOOD GLUCOSE TEST VI STRP
1.0000 | ORAL_STRIP | Freq: Three times a day (TID) | 0 refills | Status: DC
  Filled 2022-08-24: qty 100, 34d supply, fill #0

## 2022-08-24 MED ORDER — ACCU-CHEK GUIDE W/DEVICE KIT
1.0000 | PACK | Freq: Three times a day (TID) | 0 refills | Status: DC
  Filled 2022-08-24: qty 1, fill #0

## 2022-08-24 MED ORDER — ACCU-CHEK SOFTCLIX LANCETS MISC: 1.0000 | Freq: Three times a day (TID) | 0 refills | Status: AC

## 2022-08-24 MED ORDER — ACCU-CHEK SOFTCLIX LANCET DEV KIT
1.0000 | PACK | Freq: Three times a day (TID) | 0 refills | Status: DC
  Filled 2022-08-24: qty 1, 30d supply, fill #0

## 2022-08-24 NOTE — Progress Notes (Addendum)
Inpatient Diabetes Program Recommendations  AACE/ADA: New Consensus Statement on Inpatient Glycemic Control (2015)  Target Ranges:  Prepandial:   less than 140 mg/dL      Peak postprandial:   less than 180 mg/dL (1-2 hours)      Critically ill patients:  140 - 180 mg/dL   Lab Results  Component Value Date   GLUCAP 273 (H) 08/23/2022   HGBA1C >14.0 (H) 08/16/2022    Review of Glycemic Control  Latest Reference Range & Units 08/23/22 14:59 08/23/22 18:39 08/23/22 23:12 08/24/22 03:07  Glucose 70 - 99 mg/dL 295 (H) 621 (H) 308 (H) 291 (H)   Diabetes history: New DM? Outpatient Diabetes medications:  None Current orders for Inpatient glycemic control:  Novolog 0-20 units tid with meals Semglee 20 units daily  Inpatient Diabetes Program Recommendations:    Note admit for uncontrolled DM.  Will likely need to be discharged on insulin/orals.  Will see patient today face to face with sign language interpreter.  Also will request benefits check for insulins.    Addendum 43- Interpreter will come at 12:45p to assist with DM teaching. Alerted RN.  Thanks,  Beryl Meager, RN, BC-ADM Inpatient Diabetes Coordinator Pager [phone]  (8a-5p)

## 2022-08-24 NOTE — Progress Notes (Signed)
   08/24/22 1700  AVS Discharge Documentation  AVS Discharge Instructions Including Medications Provided to patient/caregiver  Name of Person Receiving AVS Discharge Instructions Including Medications Marga Hoots  Name of Clinician That Reviewed AVS Discharge Instructions Including Medications Gayland Curry RN

## 2022-08-24 NOTE — TOC Benefit Eligibility Note (Signed)
Pharmacy Patient Advocate Encounter  Insurance verification completed.    The patient is insured through [facility] Medicare Part D  Ran test claim for [employer] and the current 30 day co-pay is $0.00.  Ran test claim for Lantus Pen and Not on Formulary   This test claim was processed through [facility]- copay amounts may vary at other pharmacies due to [employer], or as the patient moves through the different stages of their insurance plan.    Roland Earl, CPHT Pharmacy Patient Advocate Specialist [facility] Patient Advocate Team Direct Number: [phone]  Fax: [phone]

## 2022-08-24 NOTE — Progress Notes (Signed)
Nutrition Education Note  RD consulted for nutrition education regarding diabetes.   Lab Results  Component Value Date   HGBA1C >14.0 (H) 08/16/2022    RD provided "Carbohydrate Counting for People with Diabetes", "Plate Method for Diabetes" and "Using Nutrition Labels: Carbohydrate" handouts from the Academy of Nutrition and Dietetics.   Patient needs a Veterinary surgeon. They were not available/present during initial visit. RD will not be on site this afternoon when interpretor will be present.   Current diet order is Carb Modified, patient is consuming approximately 75% of meals at this time. Labs and medications reviewed.   Encouraged patient to review diet education materials. Will follow up tomorrow and check when interpretor available to address any questions patient has.   Shelle Iron RD, LDN For contact information, refer to [facility].

## 2022-08-24 NOTE — TOC Initial Note (Addendum)
Transition of Care [facility]) - Initial/Assessment Note    Patient Details  Name: Matthew Fitzpatrick MRN: [id number] Date of Birth: 1980-11-08  Transition of Care [facility]) CM/SW Contact:    Lavenia Atlas, RN Phone Number: 08/24/2022, 3:34 PM  Clinical Narrative:    Received TOC consult for feed pantry resources and medication assistance. Per chart review patient has insurance [employer] Medicare Part D and not eligible for [facility] program. This RNCM spoke with patient at bedside, sign language interpretor machine not working, patient used his phone. This RNCM explained meds assistance and provided food ban resources as well as attached social services resources on AVS.  Provided OBS status to patient.            No additional TOC needs at this time.  Expected Discharge Plan: Home/Self Care Barriers to Discharge: No Barriers Identified   Patient Goals and CMS Choice Patient states their goals for this hospitalization and ongoing recovery are:: C/o fatigue, increased thirst and urinary frequency x 3 months. CMS Medicare.gov Compare Post Acute Care list provided to:: Patient Choice offered to / list presented to : Patient [place] ownership interest in [facility].provided to:: Patient    Expected Discharge Plan and Services In-house Referral: NA Discharge Planning Services: CM Consult Post Acute Care Choice: NA Living arrangements for the past 2 months: Apartment Expected Discharge Date: 08/24/22               DME Arranged: N/A DME Agency: NA       HH Arranged: NA HH Agency: NA        Prior Living Arrangements/Services Living arrangements for the past 2 months: Apartment Lives with:: Self Patient language and need for interpreter reviewed:: Yes        Need for Family Participation in Patient Care: No (Comment) Care giver support system in place?: Yes (comment) Current home services: Other (comment) (none) Criminal Activity/Legal Involvement Pertinent to Current  Situation/Hospitalization: No - Comment as needed  Activities of Daily Living Home Assistive Devices/Equipment: None ADL Screening (condition at time of admission) Patient's cognitive ability adequate to safely complete daily activities?: Yes Is the patient deaf or have difficulty hearing?: Yes Does the patient have difficulty seeing, even when wearing glasses/contacts?: No Does the patient have difficulty concentrating, remembering, or making decisions?: No Patient able to express need for assistance with ADLs?: Yes Does the patient have difficulty dressing or bathing?: No Independently performs ADLs?: Yes (appropriate for developmental age) Does the patient have difficulty walking or climbing stairs?: No Weakness of Legs: Both Weakness of Arms/Hands: None  Permission Sought/Granted      Share Information with NAME: Case manager           Emotional Assessment Appearance:: Appears stated age Attitude/Demeanor/Rapport: Gracious Affect (typically observed): Accepting Orientation: : Oriented to Self, Oriented to Place, Oriented to  Time, Oriented to Situation Alcohol / Substance Use: Not Applicable Psych Involvement: No (comment)  Admission diagnosis:  DKA (diabetic ketoacidosis) (HCC) [E11.10] Hyperosmolar hyperglycemic state (HHS) (HCC) [E11.00] Patient Active Problem List   Diagnosis Date Noted   Hyperosmolar hyperglycemic state (HHS) (HCC) 08/23/2022   Nocturia 08/16/2022   Fatty liver 05/20/2021   Epigastric pain 03/21/2021   Hemorrhoids 11/29/2020   Rectal bleeding 11/29/2020   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/21/2020   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   Uncontrolled type 2 diabetes mellitus with hyperglycemia (HCC) 08/18/2020   Insomnia 08/17/2020   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   Pes  planus 10/02/2018   Left ankle pain 10/02/2018   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   Chlamydia infection 03/29/2018   Palpitations 10/19/2016   Dyspnea  on exertion 10/19/2016   Poor dentition 09/21/2016   Pulmonary nodule 08/17/2016   Obesity (BMI 30.0-34.9) 04/20/2016   Dyslipidemia 07/02/2012   GERD (gastroesophageal reflux disease) 06/28/2010   ERECTILE DYSFUNCTION, 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   Hearing loss 03/26/2006   Human immunodeficiency virus (HIV) disease (HCC) 03/02/2006   Anxiety state 03/02/2006   Depression 03/02/2006   ALLERGIC RHINITIS 03/02/2006   PCP:  Sandford Craze, NP Pharmacy:   [facility] [phone] [facility] [place], [place] - [address] [place] [address] Phone: [phone] Fax: [phone]  [id number] - [place], [place] - [address] [place] [address] Phone: [phone] Fax: [phone]  [facility] [address] [place] [address] Phone: [phone] Fax: [phone]     Social Determinants of Health (SDOH) Social History: SDOH Screenings   Food Insecurity: Food Insecurity Present (08/23/2022)  Housing: Patient Declined (08/23/2022)  Transportation Needs: Unmet Transportation Needs (08/23/2022)  Utilities: At Risk (08/23/2022)  Depression (PHQ2-9): Low Risk  (08/23/2022)  Tobacco Use: Low Risk  (08/23/2022)   SDOH Interventions:     Readmission Risk Interventions     No data to display

## 2022-08-24 NOTE — Inpatient Diabetes Management (Signed)
Inpatient Diabetes Program Recommendations  AACE/ADA: New Consensus Statement on Inpatient Glycemic Control (2015)  Target Ranges:  Prepandial:   less than 140 mg/dL      Peak postprandial:   less than 180 mg/dL (1-2 hours)      Critically ill patients:  140 - 180 mg/dL   Lab Results  Component Value Date   GLUCAP 236 (H) 08/24/2022   HGBA1C >14.0 (H) 08/16/2022    Review of Glycemic Control  Latest Reference Range & Units 08/23/22 14:33 08/23/22 16:38 08/23/22 21:45 08/24/22 08:39 08/24/22 11:29  Glucose-Capillary 70 - 99 mg/dL 644 (H) 034 (H) 742 (H) 298 (H) 236 (H)   Diabetes history: DM - New diagnosis Outpatient Diabetes medications:  FS Libre Current orders for Inpatient glycemic control:  Semglee 20 units daily Novolog 0-20 units tid with meals  Inpatient Diabetes Program Recommendations:       Sign language interpreter at bedside- Ed- who interpreted entire teaching session.        MD ordered application of Freestyle CGM at discharge for patient. Education done regarding application and changing CGM sensor (alternate every 14 days on back of arms), 1 hour warm-up, use of glucometer when alert displays, how to scan CGM for glucose reading and information for PCP. Patient has also been given educational packet regarding use CGM sensor including the 1-800 toll free number for any questions, problems or needs related to the [facility] sensors or reader.    Sensor applied by patient to (R) Arm at (1400).  Explained that glucose readings will not be available until 1 hour after application. Reviewed use of CGM including how to scan, changing Sensor, Vitamin C warning, arrows with glucose readings, and Freestyle app.       Spoke with pt about new diagnosis.  Discussed A1C results with him and explained what an A1C is, basic pathophysiology of DM Type 2, basic home care, importance of checking CBGs and maintaining good CBG control to prevent long-term and short-term complications.   Reviewed signs and symptoms of hyperglycemia and hypoglycemia.  RNs to provide ongoing basic DM education at bedside with this patient.  Also briefly discussed importance of elimination of sugar from beverages and reducing CHO intake. Handouts given on DM survival skills, insulin pen, and treatment of hypoglycemia.  Patient was able to teach back through sign language interpreter.       Educated patient on insulin pen use at home.  Reviewed contents of insulin flexpen starter kit.  Reviewed all steps if insulin pen including attachment of needle, 2-unit air shot, dialing up dose, giving injection, removing needle, disposal of sharps, storage of unused insulin, disposal of insulin etc.  Patient able to provide successful return demonstration.  Also reviewed troubleshooting with insulin pen.  MD to give patient Rxs for insulin pens and insulin pen needles.        Needs close follow-up with MD and Outpatient DM education.   Discharge Recommendations: Other recommendations: Metformin 500 mg bid Long acting recommendations: Insulin Glargine (BASAGLAR) Kwikpen 20 units daily (given at 10 am in the hospital)  Supply/Referral recommendations: Referral to Nutrition & Diab Services Pen needles - standard  Thanks,  Beryl Meager, RN, BC-ADM Inpatient Diabetes Coordinator Pager [phone]  (8a-5p)

## 2022-08-24 NOTE — Care Management Obs Status (Signed)
MEDICARE OBSERVATION STATUS NOTIFICATION   Patient Details  Name: Matthew Fitzpatrick MRN: [id number] Date of Birth: 09-Feb-1981   Medicare Observation Status Notification Given:  Yes     Lavenia Atlas, RN 08/24/2022, 3:45 PM

## 2022-08-24 NOTE — Discharge Summary (Signed)
Physician Discharge Summary  Matthew Fitzpatrick UJW:[id number] DOB: 01/25/1981 DOA: 08/23/2022  PCP: Sandford Craze, NP  Admit date: 08/23/2022 Discharge date: 08/24/2022  Admitted From: Home Disposition: Home  Recommendations for Outpatient Follow-up:  Follow up with PCP in 1 week with repeat CBC/BMP Compliant medications and follow-up Follow up in ED if symptoms worsen or new appear   Home Health: No Equipment/Devices: None  Discharge Condition: Stable CODE STATUS: Full Diet recommendation: Carb modified  Brief/Interim Summary: 42 year old male with history of congenital deafness, anxiety, depression, diabetes mellitus type 2, dyslipidemia, GERD, HIV on Biktarvy, obesity, syphilis, pulmonary nodule presented with hyperglycemia, polyuria and polydipsia.  On presentation, he was found to be hyperglycemic with glucose of 791, normal beta hydroxybutyrate acid and sodium of 127.  He was started on IV fluids and subcutaneous insulin. His blood sugars are on the higher side but improving. He feels ok to go home. He will be discharged today on long acting insulin and metformin.  Discharge Diagnoses:   Uncontrolled diabetes mellitus type 2 with hyperglycemia Hyperosmolar hyperglycemic state -On presentation, he was found to be hyperglycemic with glucose of 791, normal beta hydroxybutyrate acid and sodium of 127.  He was started on IV fluids and subcutaneous insulin. -Blood sugars improving but still elevated.  Increased long-acting insulin to 20 units daily. -Tolerating diet.  Diet as per nutrition recommendations -Diabetes coordinator has seen the patient.  Patient eager to go home today. -A1c more than 14.  Comply with medications and follow-up.  Carb modified diet. -Discharge patient home today on long-acting insulin and metformin.  Will need close outpatient follow-up with PCP and/or endocrinology  HIV -Well-controlled.  Continue Biktarvy.  Outpatient follow-up with  ID  Obesity -Outpatient follow-up  Dyslipidemia -Currently not taking atorvastatin.  Outpatient follow-up with PCP.  Discharge Instructions   Allergies as of 08/24/2022   No Known Allergies      Medication List     TAKE these medications    atorvastatin 20 MG tablet Commonly known as: LIPITOR Take 1 tablet (20 mg total) by mouth daily.   Basaglar KwikPen 100 UNIT/ML Inject 20 Units into the skin daily. Start taking on: August 25, 2022   Biktarvy 50-200-25 MG Tabs tablet Generic drug: bictegravir-emtricitabine-tenofovir AF Take 1 tablet by mouth daily.   Blood Glucose Monitoring Suppl Devi 1 each by Does not apply route in the morning, at noon, and at bedtime. May substitute to any manufacturer covered by patient's insurance.   BLOOD GLUCOSE TEST STRIPS Strp 1 each by In Vitro route in the morning, at noon, and at bedtime. May substitute to any manufacturer covered by patient's insurance.   FreeStyle Libre 3 Sensor Misc Apply one sensor to upper arm every 14 days   Insulin Pen Needle 31G X 5 MM Misc Glargine 20 units daily   Lancet Device Misc 1 each by Does not apply route in the morning, at noon, and at bedtime. May substitute to any manufacturer covered by patient's insurance.   Lancets Misc. Misc 1 each by Does not apply route in the morning, at noon, and at bedtime. May substitute to any manufacturer covered by patient's insurance.   metFORMIN 500 MG tablet Commonly known as: GLUCOPHAGE Take 1 tablet (500 mg total) by mouth 2 (two) times daily with a meal.   ondansetron 4 MG tablet Commonly known as: ZOFRAN Take 1 tablet (4 mg total) by mouth every 6 (six) hours as needed for nausea.         Follow-up  Information     Sandford Craze, NP. Schedule an appointment as soon as possible for a visit in 1 week(s).   Specialty: Internal Medicine Contact information: [address] [place] [address] [phone]                 No Known Allergies  Consultations: None   Procedures/Studies: No results found.    Subjective: Patient seen and examined at bedside and communicated with video sign interpreter [place], [phone].  Denies any current nausea, vomiting.  Feels okay to go home today if he tolerates diet.  No fever reported.  Discharge Exam: Vitals:   08/24/22 0800 08/24/22 1000  BP: (!) 125/90 131/84  Pulse: 60 61  Resp: 17 19  Temp: 98.1 F (36.7 C)   SpO2: 95% 95%    General: Pt is alert, awake, not in acute distress.  On room air. Cardiovascular: rate controlled, S1/S2 + Respiratory: bilateral decreased breath sounds at bases Abdominal: Soft, obese, NT, ND, bowel sounds + Extremities: no edema, no cyanosis    The results of significant diagnostics from this hospitalization (including imaging, microbiology, ancillary and laboratory) are listed below for reference.     Microbiology: Recent Results (from the past 240 hour(s))  Urine Culture     Status: None   Collection Time: 08/16/22 11:19 AM   Specimen: Urine  Result Value Ref Range Status   MICRO NUMBER: [id number]  Final   SPECIMEN QUALITY: Adequate  Final   Sample Source URINE  Final   STATUS: FINAL  Final   Result: No Growth  Final  MRSA Next Gen by PCR, Nasal     Status: None   Collection Time: 08/23/22  2:17 PM   Specimen: Nasal Mucosa; Nasal Swab  Result Value Ref Range Status   MRSA by PCR Next Gen NOT DETECTED NOT DETECTED Final    Comment: (NOTE) The GeneXpert MRSA Assay (FDA approved for NASAL specimens only), is one component of a comprehensive MRSA colonization surveillance program. It is not intended to diagnose MRSA infection nor to guide or monitor treatment for MRSA infections. Test performance is not FDA approved in patients less than 46 years old. Performed at [facility], [address] [place], [place] [address]      Labs: BNP (last 3 results) No results for input(s): "BNP" in  the last 8760 hours. Basic Metabolic Panel: Recent Labs  Lab 08/23/22 1319 08/23/22 1459 08/23/22 1839 08/23/22 2312 08/24/22 0307 08/24/22 0719  NA  --  135 133* 132* 133* 132*  K  --  3.7 3.8 3.5 3.6 3.4*  CL  --  101 101 100 101 101  CO2  --  26 24 26 26 24   GLUCOSE  --  317* 316* 296* 291* 254*  BUN  --  12 11 12 11 9   CREATININE  --  0.90 0.97 0.86 0.83 0.78  CALCIUM  --  9.1 8.9 9.0 8.8* 8.4*  MG 1.8  --   --   --   --   --   PHOS 4.3  --   --   --   --   --    Liver Function Tests: Recent Labs  Lab 08/23/22 0935 08/24/22 0307  AST 18 13*  ALT 14 12  ALKPHOS 141* 105  BILITOT 0.7 0.6  PROT 7.6 6.6  ALBUMIN 3.6 3.2*   No results for input(s): "LIPASE", "AMYLASE" in the last 168 hours. No results for input(s): "AMMONIA"  in the last 168 hours. CBC: Recent Labs  Lab 08/23/22 0933 08/23/22 0939 08/24/22 0307  WBC 5.8  --  7.7  HGB 14.0 15.3 14.3  HCT 40.9 45.0 42.8  MCV 90.7  --  92.6  PLT 236  --  232   Cardiac Enzymes: No results for input(s): "CKTOTAL", "CKMB", "CKMBINDEX", "TROPONINI" in the last 168 hours. BNP: Invalid input(s): "POCBNP" CBG: Recent Labs  Lab 08/23/22 1146 08/23/22 1433 08/23/22 1638 08/23/22 2145 08/24/22 0839  GLUCAP 495* 309* 337* 273* 298*   D-Dimer No results for input(s): "DDIMER" in the last 72 hours. Hgb A1c No results for input(s): "HGBA1C" in the last 72 hours. Lipid Profile No results for input(s): "CHOL", "HDL", "LDLCALC", "TRIG", "CHOLHDL", "LDLDIRECT" in the last 72 hours. Thyroid function studies No results for input(s): "TSH", "T4TOTAL", "T3FREE", "THYROIDAB" in the last 72 hours.  Invalid input(s): "FREET3" Anemia work up No results for input(s): "VITAMINB12", "FOLATE", "FERRITIN", "TIBC", "IRON", "RETICCTPCT" in the last 72 hours. Urinalysis    Component Value Date/Time   COLORURINE YELLOW 08/23/2022 0928   APPEARANCEUR CLEAR 08/23/2022 0928   LABSPEC 1.010 08/23/2022 0928   PHURINE 6.5 08/23/2022  0928   GLUCOSEU >=500 (A) 08/23/2022 0928   GLUCOSEU NEG mg/dL 16/11/9602 5409   HGBUR NEGATIVE 08/23/2022 0928   HGBUR trace-intact 11/13/2008 1014   BILIRUBINUR NEGATIVE 08/23/2022 0928   BILIRUBINUR Negative 08/13/2017 1343   KETONESUR NEGATIVE 08/23/2022 0928   PROTEINUR NEGATIVE 08/23/2022 0928   UROBILINOGEN 2.0 (A) 08/13/2017 1343   UROBILINOGEN 0.2 02/13/2013 1922   NITRITE NEGATIVE 08/23/2022 0928   LEUKOCYTESUR NEGATIVE 08/23/2022 0928   Sepsis Labs Recent Labs  Lab 08/23/22 0933 08/24/22 0307  WBC 5.8 7.7   Microbiology Recent Results (from the past 240 hour(s))  Urine Culture     Status: None   Collection Time: 08/16/22 11:19 AM   Specimen: Urine  Result Value Ref Range Status   MICRO NUMBER: [id number]  Final   SPECIMEN QUALITY: Adequate  Final   Sample Source URINE  Final   STATUS: FINAL  Final   Result: No Growth  Final  MRSA Next Gen by PCR, Nasal     Status: None   Collection Time: 08/23/22  2:17 PM   Specimen: Nasal Mucosa; Nasal Swab  Result Value Ref Range Status   MRSA by PCR Next Gen NOT DETECTED NOT DETECTED Final    Comment: (NOTE) The GeneXpert MRSA Assay (FDA approved for NASAL specimens only), is one component of a comprehensive MRSA colonization surveillance program. It is not intended to diagnose MRSA infection nor to guide or monitor treatment for MRSA infections. Test performance is not FDA approved in patients less than 39 years old. Performed at [facility], [address], [place], [place] [address]      Time coordinating discharge: 35 minutes  SIGNED:   Glade Lloyd, MD  [facility] 08/24/2022, 10:56 AM

## 2022-08-29 ENCOUNTER — Telehealth: Payer: Self-pay | Admitting: Infectious Disease

## 2022-08-29 NOTE — Telephone Encounter (Signed)
Genosure Archive results      Biktarvy should be fine to handle his 184V

## 2022-09-07 ENCOUNTER — Encounter: Payer: Self-pay | Admitting: Family

## 2022-09-07 ENCOUNTER — Telehealth: Payer: Self-pay

## 2022-09-07 NOTE — Telephone Encounter (Signed)
Received call from [facility], he's unable to get his FreeStyle Libre at the pharmacy. Per [facility], it should be covered by his medicare part B, but the prescriber will need to complete Diabetic Testing Supplies form. He will need to reach out to his PCP to have this completed.   Called [name] back, no answer. Interpreter left message requesting call back.   Sandie Ano, RN

## 2022-09-07 NOTE — Telephone Encounter (Signed)
error 

## 2022-09-12 ENCOUNTER — Encounter: Payer: Self-pay | Admitting: Infectious Disease

## 2022-10-02 ENCOUNTER — Other Ambulatory Visit: Payer: Medicare Other

## 2022-10-02 ENCOUNTER — Other Ambulatory Visit: Payer: Self-pay

## 2022-10-02 DIAGNOSIS — B20 Human immunodeficiency virus [HIV] disease: Secondary | ICD-10-CM

## 2022-10-13 ENCOUNTER — Other Ambulatory Visit: Payer: Self-pay

## 2022-10-13 ENCOUNTER — Telehealth: Payer: Self-pay | Admitting: Family

## 2022-10-13 MED ORDER — BASAGLAR KWIKPEN 100 UNIT/ML ~~LOC~~ SOPN: 20.0000 [IU] | PEN_INJECTOR | Freq: Every day | SUBCUTANEOUS | 0 refills | Status: DC

## 2022-10-13 NOTE — Telephone Encounter (Signed)
Rx sent 

## 2022-10-13 NOTE — Telephone Encounter (Signed)
**  Pt has one day left of his insulin and needs a refill. Pt also stated that he has been having issues with [facility] stating that his insurance will not cover it and it would be $250 OOP. Pt is scheduled for appt on 8.23.24**  Prescription Request  10/13/2022  Is this a "Controlled Substance" medicine? No  LOV: 08/23/2022  What is the name of the medication or equipment?   Rx #: [id number]  Insulin Glargine (BASAGLAR KWIKPEN) 100 UNIT/ML [[id number]]   Have you contacted your pharmacy to request a refill? Yes   Which pharmacy would you like this sent to?   [facility] [phone] @ [address] [place], [place] - [address] [place] [address] Phone: [phone] Fax: [phone]   Patient notified that their request is being sent to the clinical staff for review and that they should receive a response within 2 business days.   Please advise at Mobile [phone] (mobile)

## 2022-10-15 NOTE — Progress Notes (Unsigned)
Sign language in person interpreter present  Subjective:  Chief complaint     Patient ID: Matthew Fitzpatrick, male    DOB: Jun 29, 1980, 42 y.o.   MRN: [id number]  HPI  Matthew Fitzpatrick is a 42 year old Black man with HIV disease is typically been perfectly controlled on Biktarvy.  He was seen and followed by my partner Dr. Orvan Falconer who is now retired.  He is therefore been transitioned over to to me for his HIV care.  I last saw him he had symptoms of [place] area we tested him for diabetes mellitus and indeed he has diabetes and had an A1c of 14.  He was seen by Melissa O'Sullivan's fingerstick sugar in the clinic was in the 600s.  He was admitted to hospital a blood sugar in the 700s which ultimately came under control he has been on Basaglar but this has nto been something that [facility] in [place] will provide. I believe he NEEDS TO BE GETTING INSULIN that is on the HMAP/ADAP formulary to get for his insulin or he needs to pick a different Medicare prescription drug plan I would think.     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   Qualifier: Diagnosis of  By: Orvan Falconer MD, [name]     DEPRESSION 03/02/2006   Qualifier: Diagnosis of  By: Orvan Falconer MD, [name]     diabetes type 2 08/18/2020   Dyslipidemia 07/02/2012   ERECTILE DYSFUNCTION, 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   Qualifier: Diagnosis of  By: Orvan Falconer MD, [name]     FACIAL RASH 11/17/2008   Qualifier: Diagnosis of  By: Orvan Falconer MD, [name]     Fatty liver    GERD 07/20/2009   Qualifier: Diagnosis  of  By: Orvan Falconer MD, [name]     GERD (gastroesophageal reflux disease)    HIV disease (HCC)    Monkeypox    Nocturia 08/16/2022   Obesity (BMI 30.0-34.9) 04/20/2016   PEDICULOSIS PUBIS (PUBIC LOUSE) 06/20/2006   Annotation: 4/06 Qualifier: Diagnosis of  By: Orvan Falconer MD, [name]     Poor dentition 09/21/2016   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, 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 Qualifier: Diagnosis of  By: Orvan Falconer MD, [name]     Pulmonary nodule    SINUSITIS, CHRONIC 02/18/2009   Qualifier: Diagnosis of  By: Daiva Eves MD, Earnestene Angello     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   Urinary frequency 04/06/2009   Qualifier: Diagnosis of  By: Orvan Falconer MD, [name]      Past Surgical History:  Procedure Laterality Date   COLONOSCOPY     ESOPHAGOGASTRODUODENOSCOPY     FLEXIBLE SIGMOIDOSCOPY     THROAT SURGERY     UMBILICAL HERNIA REPAIR      Family History  Problem Relation Age of Onset   Headache Mother    Hypertension Mother    Heart attack Father 78   Sudden Cardiac Death Neg Hx    Colon cancer Neg Hx    Liver disease Neg Hx    Esophageal cancer Neg Hx  Rectal cancer Neg Hx    Stomach cancer Neg Hx       Social History   Socioeconomic History   Marital status: Single    Spouse name: Not on file   Number of children: 0   Years of education: Not on file   Highest education level: Not on file  Occupational History   Not on file  Tobacco Use   Smoking status: Never    Passive exposure: Never   Smokeless tobacco: Never  Vaping Use   Vaping status: Never Used  Substance and Sexual Activity   Alcohol use: No    Alcohol/week: 0.0 standard drinks of alcohol   Drug use: No   Sexual activity: Not Currently    Partners: Male    Comment: declined condoms  Other Topics Concern   Not on file  Social History Narrative   Lives alone   Mom lives with his sister in [place]   No children   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[place]   Enjoys working on Environmental manager games   No pets   Social  Determinants of Health   Financial Resource Strain: Not on file  Food Insecurity: Food Insecurity Present (08/23/2022)   Hunger Vital Sign    Worried About Running Out of Food in the Last Year: Often true    Ran Out of Food in the Last Year: Often true  Transportation Needs: Unmet Transportation Needs (08/23/2022)   PRAPARE - Administrator, Civil Service (Medical): Yes    Lack of Transportation (Non-Medical): Yes  Physical Activity: Not on file  Stress: Not on file  Social Connections: Not on file    No Known Allergies   Current Outpatient Medications:    atorvastatin (LIPITOR) 20 MG tablet, Take 1 tablet (20 mg total) by mouth daily. (Patient not taking: Reported on 08/23/2022), Disp: 30 tablet, Rfl: 11   bictegravir-emtricitabine-tenofovir AF (BIKTARVY) 50-200-25 MG TABS tablet, Take 1 tablet by mouth daily., Disp: 30 tablet, Rfl: 11   Blood Glucose Monitoring Suppl (ACCU-CHEK GUIDE) w/Device KIT, Use to test blood sugars 3 times a day (in the morning, at noon and bedtime), Disp: 1 kit, Rfl: 0   Continuous Glucose Sensor (FREESTYLE LIBRE 3 SENSOR) MISC, Apply one sensor to upper arm every 14 days, Disp: 12 each, Rfl: 4   Insulin Glargine (BASAGLAR KWIKPEN) 100 UNIT/ML, Inject 20 Units into the skin daily., Disp: 15 mL, Rfl: 0   Insulin Pen Needle 31G X 5 MM MISC, Use to inject insulin daily, Disp: 100 each, Rfl: 0   metFORMIN (GLUCOPHAGE) 500 MG tablet, Take 1 tablet (500 mg total) by mouth 2 (two) times daily with a meal., Disp: 60 tablet, Rfl: 0   ondansetron (ZOFRAN) 4 MG tablet, Take 1 tablet (4 mg total) by mouth every 6 (six) hours as needed for nausea., Disp: 20 tablet, Rfl: 0    Review of Systems  Constitutional:  Negative for activity change, appetite change, chills, diaphoresis, fatigue, fever and unexpected weight change.  HENT:  Negative for congestion, rhinorrhea, sinus pressure, sneezing, sore throat and trouble swallowing.   Eyes:  Negative for photophobia  and visual disturbance.  Respiratory:  Negative for cough, chest tightness, shortness of breath, wheezing and stridor.   Cardiovascular:  Negative for chest pain, palpitations and leg swelling.  Gastrointestinal:  Negative for abdominal distention, abdominal pain, anal bleeding, blood in stool, constipation, diarrhea, nausea and vomiting.  Genitourinary:  Negative for difficulty urinating, dysuria, flank pain  and hematuria.  Musculoskeletal:  Negative for arthralgias, back pain, gait problem, joint swelling and myalgias.  Skin:  Negative for color change, pallor, rash and wound.  Neurological:  Negative for dizziness, tremors, weakness and light-headedness.  Hematological:  Negative for adenopathy. Does not bruise/bleed easily.  Psychiatric/Behavioral:  Negative for agitation, behavioral problems, confusion, decreased concentration, dysphoric mood and sleep disturbance.        Objective:   Physical Exam Constitutional:      Appearance: He is well-developed.  HENT:     Head: Normocephalic and atraumatic.  Eyes:     Conjunctiva/sclera: Conjunctivae normal.  Cardiovascular:     Rate and Rhythm: Normal rate and regular rhythm.  Pulmonary:     Effort: Pulmonary effort is normal. No respiratory distress.     Breath sounds: No wheezing.  Abdominal:     General: There is no distension.     Palpations: Abdomen is soft.  Musculoskeletal:        General: No tenderness. Normal range of motion.     Cervical back: Normal range of motion and neck supple.  Skin:    General: Skin is warm and dry.     Coloration: Skin is not pale.     Findings: No erythema or rash.  Neurological:     General: No focal deficit present.     Mental Status: He is alert and oriented to person, place, and time.  Psychiatric:        Mood and Affect: Mood normal.        Behavior: Behavior normal.        Thought Content: Thought content normal.        Judgment: Judgment normal.           Assessment & Plan:     HIV disease:  I have reviewed Matthew Fitzpatrick's labs including viral load which was  Lab Results  Component Value Date   HIV1RNAQUANT 20 (H) 10/02/2022   and cd4 which was  Lab Results  Component Value Date   CD4TABS 1,293 10/02/2022     I am continuing patient's prescription for Biktarvy,    Diabetes mellitus requiring insuiln:   Basaglar was sent out on Friday. I am not sure whether there is an issue with this drug not being on HMAP/ADAP formulary  Below is their formulary     He is to see Romie Minus very soon  Hyperlipidemia: will switch him to crestor .  I have personally spent 42 minutes involved in face-to-face and non-face-to-face activities for this patient on the day of the visit. Professional time spent includes the following activities: Preparing to see the patient (review of tests), Obtaining and/or reviewing separately obtained history (admission/discharge record), Performing a medically appropriate examination and/or evaluation , Ordering medications/tests/procedures, referring and communicating with other health care professionals, Documenting clinical information in the EMR, Independently interpreting results (not separately reported), Communicating results to the patient/family/caregiver, Counseling and educating the patient/family/caregiver and Care coordination (not separately reported).

## 2022-10-16 ENCOUNTER — Telehealth: Payer: Self-pay | Admitting: Family

## 2022-10-16 ENCOUNTER — Ambulatory Visit (INDEPENDENT_AMBULATORY_CARE_PROVIDER_SITE_OTHER): Payer: Medicare Other | Admitting: Infectious Disease

## 2022-10-16 ENCOUNTER — Encounter: Payer: Self-pay | Admitting: Family

## 2022-10-16 ENCOUNTER — Other Ambulatory Visit (HOSPITAL_COMMUNITY): Payer: Self-pay

## 2022-10-16 ENCOUNTER — Encounter: Payer: Self-pay | Admitting: Infectious Disease

## 2022-10-16 ENCOUNTER — Other Ambulatory Visit: Payer: Self-pay

## 2022-10-16 VITALS — BP 116/77 | HR 80 | Temp 96.0°F | Ht 68.0 in | Wt 228.0 lb

## 2022-10-16 DIAGNOSIS — E785 Hyperlipidemia, unspecified: Secondary | ICD-10-CM

## 2022-10-16 DIAGNOSIS — B20 Human immunodeficiency virus [HIV] disease: Secondary | ICD-10-CM | POA: Diagnosis not present

## 2022-10-16 DIAGNOSIS — E1165 Type 2 diabetes mellitus with hyperglycemia: Secondary | ICD-10-CM | POA: Diagnosis not present

## 2022-10-16 DIAGNOSIS — K51311 Ulcerative (chronic) rectosigmoiditis with rectal bleeding: Secondary | ICD-10-CM | POA: Diagnosis not present

## 2022-10-16 DIAGNOSIS — K760 Fatty (change of) liver, not elsewhere classified: Secondary | ICD-10-CM

## 2022-10-16 MED ORDER — BIKTARVY 50-200-25 MG PO TABS
1.0000 | ORAL_TABLET | Freq: Every day | ORAL | 11 refills | Status: DC
Start: 2022-10-16 — End: 2023-01-16

## 2022-10-16 MED ORDER — ROSUVASTATIN CALCIUM 5 MG PO TABS: 5.0000 mg | ORAL_TABLET | Freq: Every day | ORAL | 11 refills | Status: DC

## 2022-10-16 NOTE — Telephone Encounter (Signed)
Dr. Daiva Eves, FYI-See mychart message- Thanks for the heads up.

## 2022-10-17 NOTE — Telephone Encounter (Signed)
Windell Moulding, can you please call patient re: unread mychart message?

## 2022-10-17 NOTE — Telephone Encounter (Signed)
Called patient and informed of note from provider, phone number to the pharmacy provided to patient and advised to contact us if he has any issues.

## 2022-10-20 ENCOUNTER — Ambulatory Visit (INDEPENDENT_AMBULATORY_CARE_PROVIDER_SITE_OTHER): Payer: Medicare Other | Admitting: Family

## 2022-10-20 VITALS — BP 120/74 | HR 74 | Temp 98.1°F | Resp 16 | Wt 231.0 lb

## 2022-10-20 DIAGNOSIS — F411 Generalized anxiety disorder: Secondary | ICD-10-CM

## 2022-10-20 DIAGNOSIS — B20 Human immunodeficiency virus [HIV] disease: Secondary | ICD-10-CM | POA: Diagnosis not present

## 2022-10-20 DIAGNOSIS — E785 Hyperlipidemia, unspecified: Secondary | ICD-10-CM

## 2022-10-20 DIAGNOSIS — E1165 Type 2 diabetes mellitus with hyperglycemia: Secondary | ICD-10-CM | POA: Diagnosis not present

## 2022-10-20 DIAGNOSIS — Z794 Long term (current) use of insulin: Secondary | ICD-10-CM | POA: Diagnosis not present

## 2022-10-20 LAB — LDL CHOLESTEROL, DIRECT: Direct LDL: 169 mg/dL

## 2022-10-20 LAB — BASIC METABOLIC PANEL
BUN: 11 mg/dL (ref 6–23)
CO2: 27 mEq/L (ref 19–32)
Calcium: 9.1 mg/dL (ref 8.4–10.5)
Chloride: 105 mEq/L (ref 96–112)
Creatinine, Ser: 0.97 mg/dL (ref 0.40–1.50)
GFR: 95.97 mL/min (ref >60.00)
Glucose, Bld: 84 mg/dL (ref 70–99)
Potassium: 3.6 mEq/L (ref 3.5–5.1)
Sodium: 140 mEq/L (ref 135–145)

## 2022-10-20 LAB — LIPID PANEL
Cholesterol: 232 mg/dL — ABNORMAL HIGH (ref 0–200)
HDL: 40.1 mg/dL (ref >39.00)
NonHDL: 191.59
Total CHOL/HDL Ratio: 6
Triglycerides: 378 mg/dL — ABNORMAL HIGH (ref 0.0–149.0)
VLDL: 75.6 mg/dL — ABNORMAL HIGH (ref 0.0–40.0)

## 2022-10-20 LAB — MICROALBUMIN / CREATININE URINE RATIO
Creatinine,U: 136.1 mg/dL
Microalb Creat Ratio: 0.5 mg/g (ref 0.0–30.0)
Microalb, Ur: <0.7 mg/dL (ref 0.0–1.9)

## 2022-10-20 NOTE — Assessment & Plan Note (Signed)
Lab Results  Component Value Date   CHOL 258 (H) 05/31/2021   HDL 48 05/31/2021   LDLCALC 168 (H) 05/31/2021   TRIG 257 (H) 05/31/2021   CHOLHDL 5.4 (H) 05/31/2021   Repeat lipid panel. Continue statin.(Crestor)

## 2022-10-20 NOTE — Progress Notes (Unsigned)
Subjective:     Patient ID: Matthew Fitzpatrick, male    DOB: 1980-04-23, 42 y.o.   MRN: [id number]  Chief Complaint  Patient presents with   Diabetes    Here for follow up    HPI  Discussed the use of AI scribe software for clinical note transcription with the patient, who gave verbal consent to proceed.  History of Present Illness      Sign language interpretor is present for today's visit.     DM2- Patient has been having trouble getting his insulin filled from his speciality pharmacy. He has been monitoring his home readings and notes that they have been improved.   Lab Results  Component Value Date   HGBA1C >14.0 (H) 08/16/2022   HGBA1C 6.7 (H) 08/18/2020   Lab Results  Component Value Date   MICROALBUR <0.7 10/20/2022   LDLCALC 168 (H) 05/31/2021   CREATININE 0.97 10/20/2022    HIV- following with ID.    Health Maintenance Due  Topic Date Due   FOOT EXAM  Never done   DTaP/Tdap/Td (1 - Tdap) Never done   Medicare Annual Wellness (AWV)  08/14/2018   COVID-19 Vaccine (5 - 2023-24 season) 02/23/2022   INFLUENZA VACCINE  09/28/2022    Past Medical History:  Diagnosis Date   Acute conjunctivitis 02/12/2014   Acute gastroenteritis 08/12/2015   ANXIETY 03/02/2006   Qualifier: Diagnosis of  By: Orvan Falconer MD, [name]     Bronchitis 01/26/2011   CAP (community acquired pneumonia)    CHICKENPOX, HX OF 06/20/2006   Annotation: age 39 Qualifier: Diagnosis of  By: Orvan Falconer MD, [name]     Chlamydia infection 03/29/2018   COUGH, CHRONIC 12/23/2008   Qualifier: Diagnosis of  By: Orvan Falconer MD, [name]     Deaf    DEAFNESS, 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   Qualifier: Diagnosis of  By: Orvan Falconer MD, [name]     DEPRESSION 03/02/2006   Qualifier: Diagnosis of  By: Orvan Falconer MD, [name]     diabetes type 2 08/18/2020   Dyslipidemia 07/02/2012   ERECTILE DYSFUNCTION, 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   Qualifier: Diagnosis of  By: Orvan Falconer MD, [name]     FACIAL RASH 11/17/2008   Qualifier: Diagnosis of   By: Orvan Falconer MD, [name]     Fatty liver    GERD 07/20/2009   Qualifier: Diagnosis of  By: Orvan Falconer MD, [name]     GERD (gastroesophageal reflux disease)    HIV disease (HCC)    Monkeypox    Nocturia 08/16/2022   Obesity (BMI 30.0-34.9) 04/20/2016   PEDICULOSIS PUBIS (PUBIC LOUSE) 06/20/2006   Annotation: 4/06 Qualifier: Diagnosis of  By: Orvan Falconer MD, [name]     Poor dentition 09/21/2016   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, 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 Qualifier: Diagnosis of  By: Orvan Falconer MD, [name]     Pulmonary nodule    SINUSITIS, CHRONIC 02/18/2009   Qualifier: Diagnosis of  By: Daiva Eves MD, [name]     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   Urinary frequency 04/06/2009   Qualifier: Diagnosis of  By: Orvan Falconer MD, [name]      Past Surgical History:  Procedure Laterality Date   COLONOSCOPY     ESOPHAGOGASTRODUODENOSCOPY     FLEXIBLE SIGMOIDOSCOPY     THROAT SURGERY     UMBILICAL HERNIA REPAIR      Family History  Problem Relation Age of Onset   Headache Mother    Hypertension Mother    Heart attack Father 71   Sudden Cardiac Death Neg Hx  Colon cancer Neg Hx    Liver disease Neg Hx    Esophageal cancer Neg Hx    Rectal cancer Neg Hx    Stomach cancer Neg Hx     Social History   Socioeconomic History   Marital status: Single    Spouse name: Not on file   Number of children: 0   Years of education: Not on file   Highest education level: Not on file  Occupational History   Not on file  Tobacco Use   Smoking status: Never    Passive exposure: Never   Smokeless tobacco: Never  Vaping Use   Vaping status: Never Used  Substance and Sexual Activity   Alcohol use: No    Alcohol/week: 0.0 standard drinks of alcohol   Drug use: No   Sexual activity: Not Currently    Partners: Male    Comment: declined condoms  Other Topics Concern   Not on file  Social History Narrative   Lives alone   Mom lives with his sister in [place]   No children   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[place]   Enjoys working on Environmental manager games   No pets   Social Determinants of Health   Financial Resource Strain: Not on file  Food Insecurity: Food Insecurity Present (08/23/2022)   Hunger Vital Sign    Worried About Running Out of Food in the Last Year: Often true    Ran Out of Food in the Last Year: Often true  Transportation Needs: Unmet Transportation Needs (08/23/2022)   PRAPARE - Administrator, Civil Service (Medical): Yes    Lack of Transportation (Non-Medical): Yes  Physical Activity: Not on file  Stress: Not on file  Social Connections: Not on file  Intimate Partner Violence: Not At Risk (08/23/2022)   Humiliation, Afraid, Rape, and Kick questionnaire    Fear of Current or Ex-Partner: No    Emotionally Abused: No    Physically Abused: No    Sexually Abused: No    Outpatient Medications Prior to Visit  Medication Sig Dispense Refill   bictegravir-emtricitabine-tenofovir AF (BIKTARVY) 50-200-25 MG TABS tablet Take 1 tablet by mouth daily. 30 tablet 11   Blood Glucose Monitoring Suppl (ACCU-CHEK GUIDE) w/Device KIT Use to test blood sugars 3 times a day (in the morning, at noon and bedtime) 1 kit 0   Continuous Glucose Sensor (FREESTYLE LIBRE 3 SENSOR) MISC Apply one sensor to upper arm every 14 days 12 each 4   Insulin Pen Needle 31G X 5 MM MISC Use to inject insulin daily 100 each 0   metFORMIN (GLUCOPHAGE) 500 MG tablet Take 1 tablet (500 mg total) by mouth 2 (two) times daily with a meal. 60 tablet 0   ondansetron (ZOFRAN) 4 MG tablet Take 1 tablet (4 mg total) by mouth every 6 (six) hours as needed for nausea. 20 tablet 0   rosuvastatin (CRESTOR) 5 MG tablet Take 1 tablet (5 mg total) by mouth daily. 30 tablet 11   No facility-administered medications prior to visit.    No Known Allergies  ROS See HPI    Objective:    Physical Exam Constitutional:      General: He is not in acute distress.    Appearance: He is well-developed.  HENT:      Head: Normocephalic and atraumatic.  Cardiovascular:     Rate and Rhythm: Normal rate and regular rhythm.     Heart sounds: No murmur heard.  Pulmonary:     Effort: Pulmonary effort is normal. No respiratory distress.     Breath sounds: Normal breath sounds. No wheezing or rales.  Skin:    General: Skin is warm and dry.  Neurological:     Mental Status: He is alert and oriented to person, place, and time.  Psychiatric:        Behavior: Behavior normal.        Thought Content: Thought content normal.    Diabetic Foot Exam - Simple   Simple Foot Form  10/20/2022  1:42 PM  Visual Inspection No deformities, no ulcerations, no other skin breakdown bilaterally: Yes Sensation Testing See comments: Yes Pulse Check Posterior Tibialis and Dorsalis pulse intact bilaterally: Yes Comments No sensation bilaterally to monofilament.        BP 120/74 (BP Location: Right Arm, Patient Position: Sitting, Cuff Size: Large)   Pulse 74   Temp 98.1 F (36.7 C) (Oral)   Resp 16   Wt 231 lb (104.8 kg)   SpO2 97%   BMI 35.12 kg/m  Wt Readings from Last 3 Encounters:  10/20/22 231 lb (104.8 kg)  10/16/22 228 lb (103.4 kg)  08/23/22 207 lb 10.8 oz (94.2 kg)       Assessment & Plan:   Problem List Items Addressed This Visit       Unprioritized   Uncontrolled type 2 diabetes mellitus with hyperglycemia (HCC)    Contacted his speciality pharmacy and they said that the patient's insulin had been mailed and should arrive today. He reports improvement in his home readings. Too soon to repeat A1C. Continue basaglar and metformin.  Lab Results  Component Value Date   HGBA1C >14.0 (H) 08/16/2022   HGBA1C 6.7 (H) 08/18/2020   Lab Results  Component Value Date   MICROALBUR <0.7 10/20/2022   LDLCALC 168 (H) 05/31/2021   CREATININE 0.97 10/20/2022         Relevant Medications   Insulin Glargine (BASAGLAR KWIKPEN) 100 UNIT/ML   Other Relevant Orders   Urine Microalbumin w/creat. ratio  (Completed)   Basic Metabolic Panel (BMET) (Completed)   Human immunodeficiency virus (HIV) disease (HCC)    Stable, following with ID.       Dyslipidemia    Lab Results  Component Value Date   CHOL 258 (H) 05/31/2021   HDL 48 05/31/2021   LDLCALC 168 (H) 05/31/2021   TRIG 257 (H) 05/31/2021   CHOLHDL 5.4 (H) 05/31/2021   Repeat lipid panel. Continue statin.(Crestor)      Relevant Orders   Lipid panel (Completed)   Anxiety state - Primary    Stable. No concerns.        I am having Matthew Fitzpatrick "[name]" maintain his FreeStyle Libre 3 Sensor, ondansetron, metFORMIN, Insulin Pen Needle, Accu-Chek Guide, Biktarvy, rosuvastatin, and [employer].  Meds ordered this encounter  Medications   Insulin Glargine (BASAGLAR KWIKPEN) 100 UNIT/ML    Sig: Inject 20 Units into the skin daily.    Order Specific Question:   Supervising Provider    Answer:   Danise Edge A [[id number]]

## 2022-10-20 NOTE — Assessment & Plan Note (Signed)
Stable.  No concerns. °

## 2022-10-22 MED ORDER — BASAGLAR KWIKPEN 100 UNIT/ML ~~LOC~~ SOPN
20.0000 [IU] | PEN_INJECTOR | Freq: Every day | SUBCUTANEOUS | Status: DC
Start: 2022-10-22 — End: 2023-01-11

## 2022-10-22 NOTE — Assessment & Plan Note (Signed)
Stable, following with ID.

## 2022-10-22 NOTE — Assessment & Plan Note (Addendum)
Contacted his speciality pharmacy and they said that the patient's insulin had been mailed and should arrive today. He reports improvement in his home readings. Too soon to repeat A1C. Continue basaglar and metformin.  Lab Results  Component Value Date   HGBA1C >14.0 (H) 08/16/2022   HGBA1C 6.7 (H) 08/18/2020   Lab Results  Component Value Date   MICROALBUR <0.7 10/20/2022   LDLCALC 168 (H) 05/31/2021   CREATININE 0.97 10/20/2022

## 2022-12-08 ENCOUNTER — Ambulatory Visit: Payer: Medicare Other | Admitting: Dietician

## 2022-12-25 ENCOUNTER — Telehealth: Payer: Self-pay

## 2022-12-25 NOTE — Telephone Encounter (Signed)
Received call from patient using ASL interpreter. He reports difficulty getting in touch with [facility] for a Biktarvy refill.   Called [facility] and merged call with [name]. They were able to set up delivery for 12/28/22.  Sandie Ano, RN

## 2023-01-11 ENCOUNTER — Other Ambulatory Visit: Payer: Self-pay | Admitting: Family

## 2023-01-11 DIAGNOSIS — E1165 Type 2 diabetes mellitus with hyperglycemia: Secondary | ICD-10-CM

## 2023-01-16 ENCOUNTER — Encounter: Payer: Self-pay | Admitting: Infectious Disease

## 2023-01-16 ENCOUNTER — Ambulatory Visit (INDEPENDENT_AMBULATORY_CARE_PROVIDER_SITE_OTHER): Payer: Medicare Other | Admitting: Infectious Disease

## 2023-01-16 ENCOUNTER — Other Ambulatory Visit: Payer: Self-pay

## 2023-01-16 VITALS — BP 143/87 | HR 82 | Temp 97.7°F | Resp 16 | Wt 246.0 lb

## 2023-01-16 DIAGNOSIS — K5900 Constipation, unspecified: Secondary | ICD-10-CM | POA: Diagnosis not present

## 2023-01-16 DIAGNOSIS — E1165 Type 2 diabetes mellitus with hyperglycemia: Secondary | ICD-10-CM

## 2023-01-16 DIAGNOSIS — E7800 Pure hypercholesterolemia, unspecified: Secondary | ICD-10-CM | POA: Diagnosis not present

## 2023-01-16 DIAGNOSIS — E785 Hyperlipidemia, unspecified: Secondary | ICD-10-CM

## 2023-01-16 DIAGNOSIS — Z23 Encounter for immunization: Secondary | ICD-10-CM

## 2023-01-16 DIAGNOSIS — B20 Human immunodeficiency virus [HIV] disease: Secondary | ICD-10-CM | POA: Diagnosis not present

## 2023-01-16 HISTORY — DX: Constipation, unspecified: K59.00

## 2023-01-16 MED ORDER — ROSUVASTATIN CALCIUM 20 MG PO TABS: 20.0000 mg | ORAL_TABLET | Freq: Every day | ORAL | 11 refills | Status: DC

## 2023-01-16 MED ORDER — BIKTARVY 50-200-25 MG PO TABS
1.0000 | ORAL_TABLET | Freq: Every day | ORAL | 11 refills | Status: DC
Start: 2023-01-16 — End: 2023-07-17

## 2023-01-16 NOTE — Addendum Note (Signed)
Addended by: Marcell Anger on: 01/16/2023 11:48 AM   Modules accepted: Orders

## 2023-01-16 NOTE — Addendum Note (Signed)
Addended by: Marcell Anger on: 01/16/2023 02:16 PM   Modules accepted: Orders

## 2023-01-16 NOTE — Progress Notes (Signed)
Subjective:  Chief complaint: some constipation  Patient ID: Matthew Fitzpatrick, male    DOB: Jun 27, 1980, 42 y.o.   MRN: [id number]  HPI  Discussed the use of AI scribe software for clinical note transcription with the patient, who gave verbal consent to proceed.  History of Present Illness   NOTE INTERVIEW CONDUCTED WITH SIGN LANGUAGE IN PERSON INTERPRETER   The patient, with a history of HIV and diabetes, hyperlipidemia, deafness presents for a routine follow-up. His HIV is well controlled on Biktarvy, with a viral load of 20 (less than 50 is considered undetectable) and a healthy CD4 count over a thousand. He has had some issues with obtaining his insulin for diabetes, but this has been resolved and he is receiving his medication from [facility] without any current problems.  The patient also reports issues with constipation, describing it as a struggle and painful to have a bowel movement. He has tried medication for this issue, but it does not seem to be effective.  The patient has expressed financial concerns about the cost of his medication. He has been informed that his medication will be very expensive next year and is worried about this. He is currently working with a Artist to try and resolve this issue.       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   Qualifier: Diagnosis of  By: Orvan Falconer MD, [name]     DEPRESSION 03/02/2006   Qualifier: Diagnosis of  By: Orvan Falconer MD, [name]      diabetes type 2 08/18/2020   Dyslipidemia 07/02/2012   ERECTILE DYSFUNCTION, 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   Qualifier: Diagnosis of  By: Orvan Falconer MD, [name]     FACIAL RASH 11/17/2008   Qualifier: Diagnosis of  By: Orvan Falconer MD, [name]     Fatty liver    GERD 07/20/2009   Qualifier: Diagnosis of  By: Orvan Falconer MD, [name]     GERD (gastroesophageal reflux disease)    HIV disease (HCC)    Monkeypox    Nocturia 08/16/2022   Obesity (BMI 30.0-34.9) 04/20/2016   PEDICULOSIS PUBIS (PUBIC LOUSE) 06/20/2006   Annotation: 4/06 Qualifier: Diagnosis of  By: Orvan Falconer MD, [name]     Poor dentition 09/21/2016   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, 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 Qualifier: Diagnosis of  By: Orvan Falconer MD, [name]     Pulmonary nodule    SINUSITIS, CHRONIC 02/18/2009   Qualifier: Diagnosis of  By: Daiva Eves MD, Jadarion Halbig     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   Urinary frequency 04/06/2009   Qualifier: Diagnosis of  By: Orvan Falconer MD, [name]      Past Surgical History:  Procedure Laterality Date   COLONOSCOPY     ESOPHAGOGASTRODUODENOSCOPY     FLEXIBLE SIGMOIDOSCOPY     THROAT SURGERY     UMBILICAL HERNIA REPAIR      Family History  Problem Relation Age of Onset   Headache Mother    Hypertension Mother    Heart attack  Father 52   Sudden Cardiac Death Neg Hx    Colon cancer Neg Hx    Liver disease Neg Hx    Esophageal cancer Neg Hx    Rectal cancer Neg Hx    Stomach cancer Neg Hx       Social History   Socioeconomic History   Marital status: Single    Spouse name: Not on file   Number of children: 0   Years of education: Not on file   Highest education level: Not on file  Occupational History   Not on file  Tobacco Use   Smoking status: Never    Passive exposure: Never   Smokeless tobacco: Never  Vaping Use   Vaping status: Never Used  Substance and Sexual Activity   Alcohol use: No    Alcohol/week: 0.0 standard drinks of alcohol   Drug use: No   Sexual activity: Not Currently    Partners: Male     Comment: declined condoms  Other Topics Concern   Not on file  Social History Narrative   Lives alone   Mom lives with his sister in [place]   No children   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   Enjoys working on Environmental manager games   No pets   Social Determinants of Health   Financial Resource Strain: Not on file  Food Insecurity: Food Insecurity Present (08/23/2022)   Hunger Vital Sign    Worried About Running Out of Food in the Last Year: Often true    Ran Out of Food in the Last Year: Often true  Transportation Needs: Unmet Transportation Needs (08/23/2022)   PRAPARE - Administrator, Civil Service (Medical): Yes    Lack of Transportation (Non-Medical): Yes  Physical Activity: Not on file  Stress: Not on file  Social Connections: Not on file    No Known Allergies   Current Outpatient Medications:    Blood Glucose Monitoring Suppl (ACCU-CHEK GUIDE) w/Device KIT, Use to test blood sugars 3 times a day (in the morning, at noon and bedtime), Disp: 1 kit, Rfl: 0   Continuous Glucose Sensor (FREESTYLE LIBRE 3 SENSOR) MISC, Apply one sensor to upper arm every 14 days, Disp: 12 each, Rfl: 4   Insulin Glargine (BASAGLAR KWIKPEN) 100 UNIT/ML, ADMINISTER 20 UNITS UNDER THE SKIN DAILY, Disp: 15 mL, Rfl: 4   Insulin Pen Needle 31G X 5 MM MISC, Use to inject insulin daily, Disp: 100 each, Rfl: 0   rosuvastatin (CRESTOR) 20 MG tablet, Take 1 tablet (20 mg total) by mouth daily., Disp: 30 tablet, Rfl: 11   bictegravir-emtricitabine-tenofovir AF (BIKTARVY) 50-200-25 MG TABS tablet, Take 1 tablet by mouth daily., Disp: 30 tablet, Rfl: 11   metFORMIN (GLUCOPHAGE) 500 MG tablet, Take 1 tablet (500 mg total) by mouth 2 (two) times daily with a meal. (Patient not taking: Reported on 01/16/2023), Disp: 60 tablet, Rfl: 0   ondansetron (ZOFRAN) 4 MG tablet, Take 1 tablet (4 mg total) by mouth every 6 (six) hours as needed for nausea. (Patient not taking: Reported on  01/16/2023), Disp: 20 tablet, Rfl: 0   Review of Systems  Constitutional:  Negative for activity change, appetite change, chills, diaphoresis, fatigue, fever and unexpected weight change.  HENT:  Negative for congestion, rhinorrhea, sinus pressure, sneezing, sore throat and trouble swallowing.   Eyes:  Negative for photophobia and visual disturbance.  Respiratory:  Negative for cough, chest tightness, shortness of breath, wheezing and stridor.   Cardiovascular:  Negative for chest pain, palpitations and leg swelling.  Gastrointestinal:  Positive for constipation. Negative for abdominal distention, abdominal pain, anal bleeding, blood in stool, diarrhea, nausea and vomiting.  Genitourinary:  Negative for difficulty urinating, dysuria, flank pain and hematuria.  Musculoskeletal:  Negative for arthralgias, back pain, gait problem, joint swelling and myalgias.  Skin:  Negative for color change, pallor, rash and wound.  Neurological:  Negative for dizziness, tremors, weakness and light-headedness.  Hematological:  Negative for adenopathy. Does not bruise/bleed easily.  Psychiatric/Behavioral:  Negative for agitation, behavioral problems, confusion, decreased concentration, dysphoric mood and sleep disturbance.        Objective:   Physical Exam Constitutional:      Appearance: He is well-developed.  HENT:     Head: Normocephalic and atraumatic.  Eyes:     Conjunctiva/sclera: Conjunctivae normal.  Cardiovascular:     Rate and Rhythm: Normal rate and regular rhythm.  Pulmonary:     Effort: Pulmonary effort is normal. No respiratory distress.     Breath sounds: No wheezing.  Abdominal:     General: There is no distension.     Palpations: Abdomen is soft.  Musculoskeletal:        General: No tenderness. Normal range of motion.     Cervical back: Normal range of motion and neck supple.  Skin:    General: Skin is warm and dry.     Coloration: Skin is not pale.     Findings: No erythema  or rash.  Neurological:     General: No focal deficit present.     Mental Status: He is alert and oriented to person, place, and time.  Psychiatric:        Mood and Affect: Mood normal.        Behavior: Behavior normal.        Thought Content: Thought content normal.        Judgment: Judgment normal.           Assessment & Plan:   Assessment and Plan    HIV Well controlled on Biktarvy with undetectable viral load and healthy CD4 count. -Continue Biktarvy. -Order labs today. including HIV RNA quant, CD4  Diabetes Insulin prescription issues resolved. -Continue current insulin regimen.  Constipation Reports difficulty with bowel movements and ineffective over-the-counter remedies. -Consider further evaluation and management if symptoms persist.  Hyperlipidemia Not currently on statin therapy. -Start Crestor for cholesterol management.  General Health Maintenance -Received both flu and COVID vaccines. -Schedule follow-up visit in six months. -Meet with Artist, Deanna, to discuss medication costs.

## 2023-01-17 LAB — T-HELPER CELLS (CD4) COUNT (NOT AT ARMC)
CD4 % Helper T Cell: 39 % (ref 33–65)
CD4 T Cell Abs: 1083 /uL (ref 400–1790)

## 2023-01-19 LAB — CBC WITH DIFFERENTIAL/PLATELET
Absolute Lymphocytes: 2979 {cells}/uL (ref 850–3900)
Absolute Monocytes: 504 {cells}/uL (ref 200–950)
Basophils Absolute: 78 {cells}/uL (ref 0–200)
Basophils Relative: 1.4 %
Eosinophils Absolute: 62 {cells}/uL (ref 15–500)
Eosinophils Relative: 1.1 %
HCT: 40.2 % (ref 38.5–50.0)
Hemoglobin: 13.4 g/dL (ref 13.2–17.1)
MCH: 31.5 pg (ref 27.0–33.0)
MCHC: 33.3 g/dL (ref 32.0–36.0)
MCV: 94.6 fL (ref 80.0–100.0)
MPV: 10.3 fL (ref 7.5–12.5)
Monocytes Relative: 9 %
Neutro Abs: 1977 {cells}/uL (ref 1500–7800)
Neutrophils Relative %: 35.3 %
Platelets: 237 10*3/uL (ref 140–400)
RBC: 4.25 10*6/uL (ref 4.20–5.80)
RDW: 12.5 % (ref 11.0–15.0)
Total Lymphocyte: 53.2 %
WBC: 5.6 10*3/uL (ref 3.8–10.8)

## 2023-01-19 LAB — COMPLETE METABOLIC PANEL WITH GFR
AG Ratio: 1.3 (calc) (ref 1.0–2.5)
ALT: 15 U/L (ref 9–46)
AST: 16 U/L (ref 10–40)
Albumin: 4 g/dL (ref 3.6–5.1)
Alkaline phosphatase (APISO): 108 U/L (ref 36–130)
BUN: 7 mg/dL (ref 7–25)
CO2: 26 mmol/L (ref 20–32)
Calcium: 8.8 mg/dL (ref 8.6–10.3)
Chloride: 104 mmol/L (ref 98–110)
Creat: 0.97 mg/dL (ref 0.60–1.29)
Globulin: 3.1 g/dL (ref 1.9–3.7)
Glucose, Bld: 202 mg/dL — ABNORMAL HIGH (ref 65–99)
Potassium: 3.9 mmol/L (ref 3.5–5.3)
Sodium: 137 mmol/L (ref 135–146)
Total Bilirubin: 0.4 mg/dL (ref 0.2–1.2)
Total Protein: 7.1 g/dL (ref 6.1–8.1)
eGFR: 99 mL/min/{1.73_m2} (ref >=60)

## 2023-01-19 LAB — RPR TITER: RPR Titer: 1:4 {titer} — ABNORMAL HIGH

## 2023-01-19 LAB — HIV-1 RNA QUANT-NO REFLEX-BLD
HIV 1 RNA Quant: 25 {copies}/mL — ABNORMAL HIGH
HIV-1 RNA Quant, Log: 1.39 {Log_copies}/mL — ABNORMAL HIGH

## 2023-01-19 LAB — T PALLIDUM AB: T Pallidum Abs: POSITIVE — AB

## 2023-01-19 LAB — RPR: RPR Ser Ql: REACTIVE — AB

## 2023-01-22 ENCOUNTER — Ambulatory Visit (INDEPENDENT_AMBULATORY_CARE_PROVIDER_SITE_OTHER): Payer: Medicare Other | Admitting: Family

## 2023-01-22 ENCOUNTER — Telehealth: Payer: Self-pay | Admitting: Family

## 2023-01-22 ENCOUNTER — Telehealth: Payer: Self-pay | Admitting: *Deleted

## 2023-01-22 VITALS — BP 126/81 | HR 66 | Temp 98.5°F | Resp 16 | Ht 68.0 in | Wt 244.0 lb

## 2023-01-22 DIAGNOSIS — Z794 Long term (current) use of insulin: Secondary | ICD-10-CM | POA: Diagnosis not present

## 2023-01-22 DIAGNOSIS — E1165 Type 2 diabetes mellitus with hyperglycemia: Secondary | ICD-10-CM

## 2023-01-22 DIAGNOSIS — E785 Hyperlipidemia, unspecified: Secondary | ICD-10-CM | POA: Diagnosis not present

## 2023-01-22 DIAGNOSIS — M79631 Pain in right forearm: Secondary | ICD-10-CM | POA: Diagnosis not present

## 2023-01-22 DIAGNOSIS — M722 Plantar fascial fibromatosis: Secondary | ICD-10-CM | POA: Diagnosis not present

## 2023-01-22 DIAGNOSIS — K5900 Constipation, unspecified: Secondary | ICD-10-CM

## 2023-01-22 LAB — COMPREHENSIVE METABOLIC PANEL
ALT: 15 U/L (ref 0–53)
AST: 17 U/L (ref 0–37)
Albumin: 4.3 g/dL (ref 3.5–5.2)
Alkaline Phosphatase: 102 U/L (ref 39–117)
BUN: 10 mg/dL (ref 6–23)
CO2: 29 meq/L (ref 19–32)
Calcium: 9.2 mg/dL (ref 8.4–10.5)
Chloride: 105 meq/L (ref 96–112)
Creatinine, Ser: 0.91 mg/dL (ref 0.40–1.50)
GFR: 103.42 mL/min (ref >60.00)
Glucose, Bld: 80 mg/dL (ref 70–99)
Potassium: 4 meq/L (ref 3.5–5.1)
Sodium: 139 meq/L (ref 135–145)
Total Bilirubin: 0.4 mg/dL (ref 0.2–1.2)
Total Protein: 7.6 g/dL (ref 6.0–8.3)

## 2023-01-22 LAB — LIPID PANEL
Cholesterol: 229 mg/dL — ABNORMAL HIGH (ref 0–200)
HDL: 40.3 mg/dL (ref >39.00)
LDL Cholesterol: 165 mg/dL — ABNORMAL HIGH (ref 0–99)
NonHDL: 188.74
Total CHOL/HDL Ratio: 6
Triglycerides: 120 mg/dL (ref 0.0–149.0)
VLDL: 24 mg/dL (ref 0.0–40.0)

## 2023-01-22 LAB — HEMOGLOBIN A1C: Hgb A1c MFr Bld: 5.2 % (ref 4.6–6.5)

## 2023-01-22 MED ORDER — FREESTYLE LIBRE 3 SENSOR MISC: 4 refills | Status: DC

## 2023-01-22 NOTE — Telephone Encounter (Signed)
Pt says he forgot to tell you at his visit that he has been feeling overly tired and sleepy all the time over the last 4 months. States he really struggles to wake up.

## 2023-01-22 NOTE — Assessment & Plan Note (Addendum)
  Last A1c was 14, indicating poor glycemic control. Patient is currently on insulin but has not been checking sugars at home due to confusion with glucose meter. -Resend Freestyle Libre to patient's pharmacy for easier glucose monitoring. -Recheck A1c and cholesterol today. -Communicate any necessary insulin adjustments via MyChart. - Pharmacy Issues Patient reports issues with [facility] regarding insulin and needles. -Office to contact [facility] to resolve issues.

## 2023-01-22 NOTE — Telephone Encounter (Signed)
Called pharmacy ad they reports medication was sent to him 2 weeks ago at no cost. Rx for pen [facility] [place], they report he will have to follow up on it

## 2023-01-22 NOTE — Assessment & Plan Note (Signed)
He is wearing flip flop slides today. Recommended that he wear good tennis shoes. Icing/exercises as noted in AVS.

## 2023-01-22 NOTE — Telephone Encounter (Signed)
Called patient but he is not answering phone.

## 2023-01-22 NOTE — Patient Instructions (Signed)
VISIT SUMMARY:  During today's visit, we addressed several of your health concerns, including your diabetes management, cholesterol medication, constipation, right arm pain, foot pain, and weight gain. We also discussed issues with your pharmacy regarding your insulin prescription.  YOUR PLAN:  -HYPERLIPIDEMIA: Hyperlipidemia means you have high levels of fats (lipids) in your blood, which can increase your risk of heart disease. We will ensure you receive your Crestor medication via mail order and begin taking it as prescribed.  -DIABETES MELLITUS: Diabetes Mellitus is a condition where your blood sugar levels are too high. Your last A1c was 14, indicating poor blood sugar control. We will resend the [facility] to your pharmacy for easier glucose monitoring, recheck your A1c and cholesterol today, and communicate any necessary insulin adjustments via MyChart.  -CONSTIPATION: Constipation means you have infrequent or difficult bowel movements. We recommend increasing your water and fiber intake. If there is no improvement, consider adding Miralax once daily.  -RIGHT FOREARM PAIN: You have been experiencing pain in your right arm when lifting heavy objects. We will refer you to sports medicine for further evaluation.  -FOOT PAIN: You have pain in both feet when standing or walking, which may be due to plantar fasciitis. We will provide you with exercises and advise you to wear supportive shoes.  -WEIGHT GAIN: Your weight has increased, which may be affecting your blood sugar control. We encourage you to make healthier dietary choices and increase your physical activity, such as home cycling.  -PHARMACY ISSUES: You have had issues with [facility] regarding your insulin and needles. Our office will contact [facility] to resolve these issues.  INSTRUCTIONS:  Please follow up in 6 weeks to monitor your diabetes management. We will recheck your A1c and cholesterol today. If you have any questions  or need further assistance, please reach out via MyChart.

## 2023-01-22 NOTE — Progress Notes (Signed)
Subjective:     Patient ID: Matthew Fitzpatrick, male    DOB: [birth year], 42 y.o.   MRN: [id number]  Chief Complaint  Patient presents with   Arm Pain    Complains of right arm pain when lifting   Foot Pain    Complains of bilateral foot pain when standing for long   Constipation    Complains of constipation   Diabetes    Here for follow up    HPI  Discussed the use of AI scribe software for clinical note transcription with the patient, who gave verbal consent to proceed.  History of Present Illness   The patient presents today with a sign language interpreter for today's visit. He has a history of diabetes and hyperlipidemia, presents with multiple complaints. He reports right arm pain that has been ongoing for almost a year, worsening with heavy lifting. He also experiences foot pain, particularly when standing or walking, which has led to a decrease in his physical activity. The patient also reports constipation, with bowel movements occurring every two to three days to a week. He has tried multiple medications for this issue with no relief.  The patient also has concerns about his diabetes management. He reports that his blood sugar was high at his last visit, with an A1c of 14. He has been taking 20 units of insulin daily, but has not been checking his sugars at home due to confusion with his glucose meter. He also reports issues with his insulin prescription at [facility], with confusion over who should be managing his insulin prescription.  In addition, the patient has concerns about his cholesterol medication, Crestor. He reports that he has not yet received his medication, which was supposed to be sent to his mail order pharmacy. He also reports confusion over his lab results from three months ago.     Lab Results  Component Value Date   HGBA1C >14.0 (H) 08/16/2022   HGBA1C 6.7 (H) 08/18/2020   Lab Results  Component Value Date   MICROALBUR <0.7 10/20/2022   LDLCALC  168 (H) 05/31/2021   CREATININE 0.97 01/16/2023         Health Maintenance Due  Topic Date Due   Medicare Annual Wellness (AWV)  08/14/2018    Past Medical History:  Diagnosis Date   Acute conjunctivitis 02/12/2014   Acute gastroenteritis 08/12/2015   ANXIETY 03/02/2006   Qualifier: Diagnosis of  By: Orvan Falconer MD, [name]     Bronchitis 01/26/2011   CAP (community acquired pneumonia)    CHICKENPOX, HX OF 06/20/2006   Annotation: age 46 Qualifier: Diagnosis of  By: Orvan Falconer MD, [name]     Chlamydia infection 03/29/2018   Constipated 01/16/2023   COUGH, CHRONIC 12/23/2008   Qualifier: Diagnosis of  By: Orvan Falconer MD, [name]     Deaf    DEAFNESS, 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   Qualifier: Diagnosis of  By: Orvan Falconer MD, [name]     DEPRESSION 03/02/2006   Qualifier: Diagnosis of  By: Orvan Falconer MD, [name]     diabetes type 2 08/18/2020   Dyslipidemia 07/02/2012   ERECTILE DYSFUNCTION, 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   Qualifier: Diagnosis of  By: Orvan Falconer MD, [name]     FACIAL RASH 11/17/2008   Qualifier: Diagnosis of  By: Orvan Falconer MD, [name]     Fatty liver    GERD 07/20/2009   Qualifier: Diagnosis of  By: Orvan Falconer MD, [name]     GERD (gastroesophageal reflux disease)    HIV disease (HCC)    Monkeypox  Nocturia 08/16/2022   Obesity (BMI 30.0-34.9) 04/20/2016   PEDICULOSIS PUBIS (PUBIC LOUSE) 06/20/2006   Annotation: 4/06 Qualifier: Diagnosis of  By: Orvan Falconer MD, [name]     Poor dentition 09/21/2016   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, 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 Qualifier: Diagnosis of  By: Orvan Falconer MD, [name]     Pulmonary nodule    SINUSITIS, CHRONIC 02/18/2009   Qualifier: Diagnosis of  By: Daiva Eves MD, [name]     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   Urinary frequency 04/06/2009   Qualifier: Diagnosis of  By: Orvan Falconer MD, [name]      Past Surgical History:  Procedure Laterality Date   COLONOSCOPY     ESOPHAGOGASTRODUODENOSCOPY     FLEXIBLE SIGMOIDOSCOPY     THROAT SURGERY     UMBILICAL HERNIA REPAIR      Family  History  Problem Relation Age of Onset   Headache Mother    Hypertension Mother    Heart attack Father 65   Sudden Cardiac Death Neg Hx    Colon cancer Neg Hx    Liver disease Neg Hx    Esophageal cancer Neg Hx    Rectal cancer Neg Hx    Stomach cancer Neg Hx     Social History   Socioeconomic History   Marital status: Single    Spouse name: Not on file   Number of children: 0   Years of education: Not on file   Highest education level: Not on file  Occupational History   Not on file  Tobacco Use   Smoking status: Never    Passive exposure: Never   Smokeless tobacco: Never  Vaping Use   Vaping status: Never Used  Substance and Sexual Activity   Alcohol use: No    Alcohol/week: 0.0 standard drinks of alcohol   Drug use: No   Sexual activity: Not Currently    Partners: Male    Comment: declined condoms  Other Topics Concern   Not on file  Social History Narrative   Lives alone   Mom lives with his sister in [place]   No children   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[place]   Enjoys working on Environmental manager games   No pets   Social Determinants of Health   Financial Resource Strain: Not on file  Food Insecurity: Food Insecurity Present (08/23/2022)   Hunger Vital Sign    Worried About Running Out of Food in the Last Year: Often true    Ran Out of Food in the Last Year: Often true  Transportation Needs: Unmet Transportation Needs (08/23/2022)   PRAPARE - Administrator, Civil Service (Medical): Yes    Lack of Transportation (Non-Medical): Yes  Physical Activity: Not on file  Stress: Not on file  Social Connections: Not on file  Intimate Partner Violence: Not At Risk (08/23/2022)   Humiliation, Afraid, Rape, and Kick questionnaire    Fear of Current or Ex-Partner: No    Emotionally Abused: No    Physically Abused: No    Sexually Abused: No    Outpatient Medications Prior to Visit  Medication Sig Dispense Refill    bictegravir-emtricitabine-tenofovir AF (BIKTARVY) 50-200-25 MG TABS tablet Take 1 tablet by mouth daily. 30 tablet 11   Blood Glucose Monitoring Suppl (ACCU-CHEK GUIDE) w/Device KIT Use to test blood sugars 3 times a day (in the morning, at noon and bedtime) 1 kit 0   Insulin Glargine (BASAGLAR KWIKPEN) 100 UNIT/ML ADMINISTER 20 UNITS UNDER THE SKIN  DAILY 15 mL 4   Insulin Pen Needle 31G X 5 MM MISC Use to inject insulin daily 100 each 0   metFORMIN (GLUCOPHAGE) 500 MG tablet Take 1 tablet (500 mg total) by mouth 2 (two) times daily with a meal. (Patient not taking: Reported on 01/16/2023) 60 tablet 0   ondansetron (ZOFRAN) 4 MG tablet Take 1 tablet (4 mg total) by mouth every 6 (six) hours as needed for nausea. (Patient not taking: Reported on 01/16/2023) 20 tablet 0   rosuvastatin (CRESTOR) 20 MG tablet Take 1 tablet (20 mg total) by mouth daily. 30 tablet 11   Continuous Glucose Sensor (FREESTYLE LIBRE 3 SENSOR) MISC Apply one sensor to upper arm every 14 days 12 each 4   No facility-administered medications prior to visit.    No Known Allergies  ROS See HPI    Objective:    Physical Exam Constitutional:      General: He is not in acute distress.    Appearance: He is well-developed.  HENT:     Head: Normocephalic and atraumatic.  Cardiovascular:     Rate and Rhythm: Normal rate and regular rhythm.     Heart sounds: No murmur heard. Pulmonary:     Effort: Pulmonary effort is normal. No respiratory distress.     Breath sounds: Normal breath sounds. No wheezing or rales.  Musculoskeletal:     Comments: Bilateral flat feet noted  Skin:    General: Skin is warm and dry.  Neurological:     Mental Status: He is alert and oriented to person, place, and time.  Psychiatric:        Behavior: Behavior normal.        Thought Content: Thought content normal.    Diabetic Foot Exam - Simple   Simple Foot Form Diabetic Foot exam was performed with the following findings: Yes 01/22/2023   1:45 PM  Visual Inspection No deformities, no ulcerations, no other skin breakdown bilaterally: Yes Sensation Testing Intact to touch and monofilament testing bilaterally: Yes Pulse Check Posterior Tibialis and Dorsalis pulse intact bilaterally: Yes Comments       BP 126/81 (BP Location: Right Arm, Patient Position: Sitting, Cuff Size: Large)   Pulse 66   Temp 98.5 F (36.9 C) (Oral)   Resp 16   Ht 5\' 8"  (1.727 m)   Wt 244 lb (110.7 kg)   SpO2 97%   BMI 37.10 kg/m  Wt Readings from Last 3 Encounters:  01/22/23 244 lb (110.7 kg)  01/16/23 246 lb (111.6 kg)  10/20/22 231 lb (104.8 kg)       Assessment & Plan:   Problem List Items Addressed This Visit       Unprioritized   Uncontrolled type 2 diabetes mellitus with hyperglycemia (HCC)     Last A1c was 14, indicating poor glycemic control. Patient is currently on insulin but has not been checking sugars at home due to confusion with glucose meter. -Resend Freestyle Libre to patient's pharmacy for easier glucose monitoring. -Recheck A1c and cholesterol today. -Communicate any necessary insulin adjustments via MyChart. - Pharmacy Issues Patient reports issues with [facility] regarding insulin and needles. -Office to contact [facility] to resolve issues.       Relevant Medications   Continuous Glucose Sensor (FREESTYLE LIBRE 3 SENSOR) MISC   Other Relevant Orders   HgB A1c   Urine Microalbumin w/creat. ratio   Comp Met (CMET)   Right forearm pain     Patient reports pain when lifting heavy objects,  ongoing for approximately a year. -Refer to sports medicine for further evaluation.      Relevant Orders   Ambulatory referral to Sports Medicine   Hyperlipidemia - Primary     Patient has not yet received Crestor via mail order. -Ensure patient receives Crestor and begins taking as prescribed.      Relevant Orders   Lipid panel   Constipated     Patient reports bowel movements every 2-3 days to a  week. -Advise patient to increase water and fiber intake. -If no improvement, consider adding Miralax once daily.      Bilateral plantar fasciitis    He is wearing flip flop slides today. Recommended that he wear good tennis shoes. Icing/exercises as noted in AVS.       I am having Corby L. Masser "[name]" maintain his ondansetron, metFORMIN, Insulin Pen Needle, Accu-Chek Guide, Basaglar KwikPen, Biktarvy, rosuvastatin, and FreeStyle Libre 3 Sensor.  Meds ordered this encounter  Medications   Continuous Glucose Sensor (FREESTYLE LIBRE 3 SENSOR) MISC    Sig: Apply one sensor to upper arm every 14 days    Dispense:  12 each    Refill:  4    Order Specific Question:   Supervising Provider    Answer:   Danise Edge A [[id number]]

## 2023-01-22 NOTE — Assessment & Plan Note (Signed)
  Patient reports pain when lifting heavy objects, ongoing for approximately a year. -Refer to sports medicine for further evaluation.

## 2023-01-22 NOTE — Telephone Encounter (Signed)
Can you please call his pharmacy and see if they are needing a PA on his Basaglar?  If so, has this been started?  Also, can you ask why his pen needles were not covered by his insurance.

## 2023-01-22 NOTE — Assessment & Plan Note (Signed)
  Patient reports bowel movements every 2-3 days to a week. -Advise patient to increase water and fiber intake. -If no improvement, consider adding Miralax once daily.

## 2023-01-22 NOTE — Assessment & Plan Note (Signed)
  Patient has not yet received Crestor via mail order. -Ensure patient receives Crestor and begins taking as prescribed.

## 2023-01-23 ENCOUNTER — Other Ambulatory Visit: Payer: Self-pay

## 2023-01-23 LAB — MICROALBUMIN / CREATININE URINE RATIO
Creatinine,U: 127 mg/dL
Microalb Creat Ratio: 0.6 mg/g (ref 0.0–30.0)
Microalb, Ur: <0.7 mg/dL (ref 0.0–1.9)

## 2023-01-23 MED ORDER — INSULIN PEN NEEDLE 31G X 5 MM MISC: 0 refills | Status: DC

## 2023-01-23 MED ORDER — BASAGLAR KWIKPEN 100 UNIT/ML ~~LOC~~ SOPN: 17.0000 [IU] | PEN_INJECTOR | Freq: Every day | SUBCUTANEOUS | Status: DC

## 2023-01-23 NOTE — Telephone Encounter (Signed)
My chart message sent to patient with this information

## 2023-01-23 NOTE — Telephone Encounter (Signed)
Unable to reach patient on the phone, MyChart message sent to patient.

## 2023-03-09 ENCOUNTER — Telehealth: Payer: Self-pay | Admitting: Family

## 2023-03-09 DIAGNOSIS — E1165 Type 2 diabetes mellitus with hyperglycemia: Secondary | ICD-10-CM

## 2023-03-09 MED ORDER — BASAGLAR KWIKPEN 100 UNIT/ML ~~LOC~~ SOPN: 17.0000 [IU] | PEN_INJECTOR | Freq: Every day | SUBCUTANEOUS | 1 refills | Status: DC

## 2023-03-09 NOTE — Telephone Encounter (Signed)
 Rx sent

## 2023-03-09 NOTE — Telephone Encounter (Signed)
 Copied from CRM [id number]. Topic: Clinical - Medication Refill >> Mar 08, 2023  4:21 PM Burnard DEL wrote: Most Recent Primary Care Visit:  Provider: O'SULLIVAN, MELISSA  Department: [facility]  Visit Type: OFFICE VISIT  Date: 01/22/2023  Medication: Insulin  Glargine (BASAGLAR  KWIKPEN) 100 UNIT/ML  Has the patient contacted their pharmacy? Yes (Agent: If no, request that the patient contact the pharmacy for the refill. If patient does not wish to contact the pharmacy document the reason why and proceed with request.) (Agent: If yes, when and what did the pharmacy advise?)  Is this the correct pharmacy for this prescription? Yes If no, delete pharmacy and type the correct one.  This is the patient's preferred pharmacy:  [facility] [phone] @ [address] [place], [place] - [address] [facility] [address] [place] [address] Phone: [phone] Fax: [phone]   Has the prescription been filled recently? No  Is the patient out of the medication? Yes  Has the patient been seen for an appointment in the last year OR does the patient have an upcoming appointment? Yes  Can we respond through MyChart? Yes  Agent: Please be advised that Rx refills may take up to 3 business days. We ask that you follow-up with your pharmacy.

## 2023-03-12 ENCOUNTER — Ambulatory Visit (INDEPENDENT_AMBULATORY_CARE_PROVIDER_SITE_OTHER): Payer: Medicare Other | Admitting: Family

## 2023-03-12 VITALS — BP 127/82 | HR 77 | Temp 97.7°F | Resp 16 | Ht 68.0 in | Wt 252.0 lb

## 2023-03-12 DIAGNOSIS — E785 Hyperlipidemia, unspecified: Secondary | ICD-10-CM

## 2023-03-12 DIAGNOSIS — E1165 Type 2 diabetes mellitus with hyperglycemia: Secondary | ICD-10-CM

## 2023-03-12 DIAGNOSIS — Z794 Long term (current) use of insulin: Secondary | ICD-10-CM

## 2023-03-12 DIAGNOSIS — E119 Type 2 diabetes mellitus without complications: Secondary | ICD-10-CM

## 2023-03-12 DIAGNOSIS — B20 Human immunodeficiency virus [HIV] disease: Secondary | ICD-10-CM

## 2023-03-12 LAB — LIPID PANEL
Cholesterol: 169 mg/dL (ref 0–200)
HDL: 50.2 mg/dL (ref >39.00)
LDL Cholesterol: 83 mg/dL (ref 0–99)
NonHDL: 118.53
Total CHOL/HDL Ratio: 3
Triglycerides: 180 mg/dL — ABNORMAL HIGH (ref 0.0–149.0)
VLDL: 36 mg/dL (ref 0.0–40.0)

## 2023-03-12 LAB — HEMOGLOBIN A1C: Hgb A1c MFr Bld: 6.3 % (ref 4.6–6.5)

## 2023-03-12 MED ORDER — BASAGLAR KWIKPEN 100 UNIT/ML ~~LOC~~ SOPN: 15.0000 [IU] | PEN_INJECTOR | Freq: Every day | SUBCUTANEOUS | Status: DC

## 2023-03-12 MED ORDER — FREESTYLE LIBRE 3 SENSOR MISC: 4 refills | Status: DC

## 2023-03-12 NOTE — Progress Notes (Signed)
 Subjective:     Patient ID: Matthew Fitzpatrick, male    DOB: 03-22-1980, 43 y.o.   MRN: [id number]  Chief Complaint  Patient presents with   Diabetes    Here for follow up    HPI  Discussed the use of AI scribe software for clinical note transcription with the patient, who gave verbal consent to proceed.  History of Present Illness   The patient presents today with a sign language interpreter for follow up.  He has a history of diabetes and reports issues getting in touch with his mail order pharmacy. He has been trying to contact the [facility] in [place] for a prescription refill. Despite multiple attempts, he has not received a response. The patient does not check his blood sugar levels regularly. He has previously used a continuous glucose monitor but is currently not using it. The patient has been experiencing difficulty sleeping, which he attributes to low blood sugar levels. (Though he has not checked his sugars during these episodes). He has been managing these episodes by eating something when he feels his sugar is low. The patient also reports having trouble sleeping since last week, even after eating and taking his medication.     Lab Results  Component Value Date   HGBA1C 6.3 03/12/2023   HGBA1C 5.2 01/22/2023   HGBA1C >14.0 (H) 08/16/2022   Lab Results  Component Value Date   MICROALBUR <0.7 01/22/2023   LDLCALC 83 03/12/2023   CREATININE 0.91 01/22/2023        Health Maintenance Due  Topic Date Due   DTaP/Tdap/Td (1 - Tdap) Never done   Pneumococcal Vaccine 101-68 Years old (3 of 3 - PCV) 11/03/2010   Medicare Annual Wellness (AWV)  08/14/2018   COVID-19 Vaccine (6 - 2024-25 season) 03/13/2023    Past Medical History:  Diagnosis Date   Acute conjunctivitis 02/12/2014   Acute gastroenteritis 08/12/2015   ANXIETY 03/02/2006   Qualifier: Diagnosis of  By: Elaine MD, [name]     Bronchitis 01/26/2011   CAP (community acquired pneumonia)    CHICKENPOX,  HX OF 06/20/2006   Annotation: age 11 Qualifier: Diagnosis of  By: Elaine MD, [name]     Chlamydia infection 03/29/2018   Constipated 01/16/2023   COUGH, CHRONIC 12/23/2008   Qualifier: Diagnosis of  By: Elaine MD, [name]     Deaf    DEAFNESS, 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   Qualifier: Diagnosis of  By: Elaine MD, [name]     DEPRESSION 03/02/2006   Qualifier: Diagnosis of  By: Elaine MD, [name]     diabetes type 2 08/18/2020   Dyslipidemia 07/02/2012   ERECTILE DYSFUNCTION, 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   Qualifier: Diagnosis of  By: Elaine MD, [name]     FACIAL RASH 11/17/2008   Qualifier: Diagnosis of  By: Elaine MD, [name]     Fatty liver    GERD 07/20/2009   Qualifier: Diagnosis of  By: Elaine MD, [name]     GERD (gastroesophageal reflux disease)    HIV disease (HCC)    Monkeypox    Nocturia 08/16/2022   Obesity (BMI 30.0-34.9) 04/20/2016   PEDICULOSIS PUBIS (PUBIC LOUSE) 06/20/2006   Annotation: 4/06 Qualifier: Diagnosis of  By: Elaine MD, [name]     Poor dentition 09/21/2016   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, 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 Qualifier: Diagnosis of  By: Elaine MD, [name]     Pulmonary nodule    SINUSITIS, CHRONIC 02/18/2009   Qualifier: Diagnosis of  By: Fleeta Rothman MD,  [name]     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   Urinary frequency 04/06/2009   Qualifier: Diagnosis of  By: Elaine MD, [name]      Past Surgical History:  Procedure Laterality Date   COLONOSCOPY     ESOPHAGOGASTRODUODENOSCOPY     FLEXIBLE SIGMOIDOSCOPY     THROAT SURGERY     UMBILICAL HERNIA REPAIR      Family History  Problem Relation Age of Onset   Headache Mother    Hypertension Mother    Heart attack Father 63   Sudden Cardiac Death Neg Hx    Colon cancer Neg Hx    Liver disease Neg Hx    Esophageal cancer Neg Hx    Rectal cancer Neg Hx    Stomach cancer Neg Hx     Social History   Socioeconomic History   Marital status: Single    Spouse name: Not on file   Number of children: 0   Years of education:  Not on file   Highest education level: Not on file  Occupational History   Not on file  Tobacco Use   Smoking status: Never    Passive exposure: Never   Smokeless tobacco: Never  Vaping Use   Vaping status: Never Used  Substance and Sexual Activity   Alcohol use: No    Alcohol/week: 0.0 standard drinks of alcohol   Drug use: No   Sexual activity: Not Currently    Partners: Male    Comment: declined condoms  Other Topics Concern   Not on file  Social History Narrative   Lives alone   Mom lives with his sister in [place]   No children   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 [place]   Enjoys working on environmental manager games   No pets   Social Drivers of Corporate Investment Banker Strain: Not on file  Food Insecurity: Food Insecurity Present (08/23/2022)   Hunger Vital Sign    Worried About Running Out of Food in the Last Year: Often true    Ran Out of Food in the Last Year: Often true  Transportation Needs: Unmet Transportation Needs (08/23/2022)   PRAPARE - Administrator, Civil Service (Medical): Yes    Lack of Transportation (Non-Medical): Yes  Physical Activity: Not on file  Stress: Not on file  Social Connections: Not on file  Intimate Partner Violence: Not At Risk (08/23/2022)   Humiliation, Afraid, Rape, and Kick questionnaire    Fear of Current or Ex-Partner: No    Emotionally Abused: No    Physically Abused: No    Sexually Abused: No    Outpatient Medications Prior to Visit  Medication Sig Dispense Refill   bictegravir-emtricitabine -tenofovir  AF (BIKTARVY ) 50-200-25 MG TABS tablet Take 1 tablet by mouth daily. 30 tablet 11   Blood Glucose Monitoring Suppl (ACCU-CHEK GUIDE) w/Device KIT Use to test blood sugars 3 times a day (in the morning, at noon and bedtime) 1 kit 0   Insulin  Pen Needle 31G X 5 MM MISC Use to inject insulin  daily 100 each 0   rosuvastatin  (CRESTOR ) 20 MG tablet Take 1 tablet (20 mg total) by mouth daily. 30 tablet 11    Continuous Glucose Sensor (FREESTYLE LIBRE 3 SENSOR) MISC Apply one sensor to upper arm every 14 days 12 each 4   Insulin  Glargine (BASAGLAR  KWIKPEN) 100 UNIT/ML Inject 17 Units into the skin daily. 15 mL 1   No facility-administered medications prior to visit.    No Known  Allergies  ROS See HPI    Objective:    Physical Exam Constitutional:      General: He is not in acute distress.    Appearance: He is well-developed.  HENT:     Head: Normocephalic and atraumatic.  Cardiovascular:     Rate and Rhythm: Normal rate and regular rhythm.     Heart sounds: No murmur heard. Pulmonary:     Effort: Pulmonary effort is normal. No respiratory distress.     Breath sounds: Normal breath sounds. No wheezing or rales.  Skin:    General: Skin is warm and dry.  Neurological:     Mental Status: He is alert and oriented to person, place, and time.  Psychiatric:        Behavior: Behavior normal.        Thought Content: Thought content normal.      BP 127/82   Pulse 77   Temp 97.7 F (36.5 C) (Oral)   Resp 16   Ht 5' 8 (1.727 m)   Wt 252 lb (114.3 kg)   SpO2 97%   BMI 38.32 kg/m  Wt Readings from Last 3 Encounters:  03/12/23 252 lb (114.3 kg)  01/22/23 244 lb (110.7 kg)  01/16/23 246 lb (111.6 kg)       Assessment & Plan:   Problem List Items Addressed This Visit       Unprioritized   Hyperlipidemia - Primary    Patient is on Crestor  20mg . Last cholesterol level was elevated. -Continue current cholesterol medication. -Recheck cholesterol level today.      Relevant Orders   Lipid panel (Completed)   Diabetes mellitus type 2, controlled (HCC)    Patient has one dose of insulin  and has been having difficulty obtaining a refill from the pharmacy. We spoke to his pharmacy and they stated that he needs to call them every time he requests a refill to verify his address or they will not send out the refill. They will overnight an insulin  pen to him. He believes that he  has enough insulin  for a 15 unit dose tonight.  Patient has not been checking blood glucose levels and has been experiencing symptoms suggestive of hypoglycemia, particularly at night. Previous A1c was 5, suggesting possible over-treatment.  -Reduce insulin  dose from 20 units to 15 units daily due to perceived hypoglycemia. -Order Freestyle Libre continuous glucose monitor and encouraged patient to use it to better track blood glucose levels. -Check A1c and cholesterol levels today. -Follow up in 3 months.      Relevant Medications   Insulin  Glargine (BASAGLAR  KWIKPEN) 100 UNIT/ML   Continuous Glucose Sensor (FREESTYLE LIBRE 3 SENSOR) MISC   Other Relevant Orders   AMB Referral [facility] Care Management    I have changed Matthew Fitzpatrick's Basaglar  KwikPen. I am also having him maintain his Accu-Chek Guide, Biktarvy , rosuvastatin , Insulin  Pen Needle, and FreeStyle Libre 3 Sensor.  Meds ordered this encounter  Medications   Insulin  Glargine (BASAGLAR  KWIKPEN) 100 UNIT/ML    Sig: Inject 15 Units into the skin daily.    Supervising Provider:   DOMENICA BLACKBIRD A [[id number]]   Continuous Glucose Sensor (FREESTYLE LIBRE 3 SENSOR) MISC    Sig: Apply one sensor to upper arm every 14 days    Dispense:  12 each    Refill:  4    Supervising Provider:   DOMENICA BLACKBIRD A [[id number]]

## 2023-03-13 NOTE — Assessment & Plan Note (Signed)
  Patient is on Crestor 20mg . Last cholesterol level was elevated. -Continue current cholesterol medication. -Recheck cholesterol level today.

## 2023-03-13 NOTE — Assessment & Plan Note (Signed)
 HIV is well controlled and followed by ID.  He is maintained on Biktarvy.

## 2023-03-13 NOTE — Assessment & Plan Note (Addendum)
  Patient has one dose of insulin  and has been having difficulty obtaining a refill from the pharmacy. We spoke to his pharmacy and they stated that he needs to call them every time he requests a refill to verify his address or they will not send out the refill. They will overnight an insulin  pen to him. He believes that he has enough insulin  for a 15 unit dose tonight.  Patient has not been checking blood glucose levels and has been experiencing symptoms suggestive of hypoglycemia, particularly at night. Previous A1c was 5, suggesting possible over-treatment.  -Reduce insulin  dose from 20 units to 15 units daily due to perceived hypoglycemia. -Order Freestyle Libre continuous glucose monitor and encouraged patient to use it to better track blood glucose levels. -Check A1c and cholesterol levels today. -Follow up in 3 months.

## 2023-03-13 NOTE — Patient Instructions (Signed)
 VISIT SUMMARY:  During today's visit, we discussed your diabetes management, including your recent insulin  shortage and difficulties with blood sugar levels. We also reviewed your cholesterol levels and general health maintenance.  YOUR PLAN:  -DIABETES MELLITUS: Diabetes Mellitus is a condition where your blood sugar levels are too high. We will coordinate with the pharmacy to ensure you receive your insulin  refill. Your insulin  dose will be reduced to 15 units daily. We will order a Freestyle Libre continuous glucose monitor for you to better track your blood sugar levels. Additionally, we will check your A1c and cholesterol levels today. Please follow up in 3 months.  -HYPERLIPIDEMIA: Hyperlipidemia means you have high cholesterol levels. You should continue your current cholesterol medication, and we will recheck your cholesterol level today.  -GENERAL HEALTH MAINTENANCE: You received your COVID booster on 01/16/2023. We will review your routine labs and ensure all your health maintenance is up to date.  INSTRUCTIONS:  Please follow up in 3 months for a review of your diabetes management and cholesterol levels. Ensure you get your insulin  refill and start using the [facility] continuous glucose monitor as soon as possible.

## 2023-03-14 ENCOUNTER — Telehealth: Payer: Self-pay

## 2023-03-14 NOTE — Progress Notes (Signed)
 Care Guide Pharmacy Note  03/14/2023 Name: Matthew Fitzpatrick MRN: [id number] DOB: Jun 25, 1980  Referred By: Matthew Gaucher, Matthew Fitzpatrick Reason for referral: Care Coordination (Outreach to schedule with Pharm d )   Matthew Fitzpatrick is a 43 y.o. year old male who is a primary care patient of Matthew Gaucher, Matthew Fitzpatrick.  Matthew Fitzpatrick was referred to the pharmacist for assistance related to: DMII  An unsuccessful telephone outreach was attempted today to contact the patient who was referred to the pharmacy team for assistance with medication management. Additional attempts will be made to contact the patient.  Lenton Rail , RMA     [facility]  [facility] Guide  Direct Dial: [phone]  Website: Matthew Fitzpatrick

## 2023-03-16 NOTE — Progress Notes (Unsigned)
Care Guide Pharmacy Note  03/16/2023 Name: Matthew Fitzpatrick MRN: [id number] DOB: 05-16-1980  Referred By: Sandford Craze, NP Reason for referral: Care Coordination (Outreach to schedule with Pharm d )   Matthew Fitzpatrick is a 43 y.o. year old male who is a primary care patient of Sandford Craze, NP.  Matthew Fitzpatrick was referred to the pharmacist for assistance related to: DMII  A second unsuccessful telephone outreach was attempted today to contact the patient who was referred to the pharmacy team for assistance with medication management. Additional attempts will be made to contact the patient.  Penne Lash , RMA     [facility]  [facility], [facility] Psychiatric Guide  Direct Dial: [phone]  Website: [email or web address]

## 2023-03-20 ENCOUNTER — Telehealth: Payer: Self-pay | Admitting: Family

## 2023-03-20 MED ORDER — INSULIN GLARGINE 100 UNIT/ML SOLOSTAR PEN: 15.0000 [IU] | PEN_INJECTOR | Freq: Every day | SUBCUTANEOUS | 11 refills | Status: DC

## 2023-03-20 NOTE — Telephone Encounter (Signed)
It looks like his insurance won't pay for basaglar after the 30 day supply they provided- preferred insulin is Lantus.  Rx sent for lantus to his mail order pharmacy. Can you please let pt know and ask him to let us know if he has any trouble getting the new insulin.

## 2023-03-21 NOTE — Telephone Encounter (Signed)
 Called patient but no answer, will try again later

## 2023-03-22 NOTE — Telephone Encounter (Signed)
Called again but no answer, left detailed message with his answering service with all the information about medication change due to insurance, rx sent to pharmacy and to call the pharmacy to have rx process as well as call us if any difficulties getting prescription.

## 2023-03-26 NOTE — Telephone Encounter (Signed)
Have continued to call patient but no answer. MyChart message sent with this information

## 2023-04-09 NOTE — Progress Notes (Signed)
 Care Guide Pharmacy Note  03/20/2023 Name: Matthew Fitzpatrick MRN: [id number] DOB: [birth year]  Referred By: Dorrene Gaucher, NP Reason for referral: Care Coordination (Outreach to schedule with Pharm d )   Matthew Fitzpatrick is a 43 y.o. year old male who is a primary care patient of Dorrene Gaucher, NP.  Matthew Fitzpatrick was referred to the pharmacist for assistance related to: DMII  A third unsuccessful telephone outreach was attempted today to contact the patient who was referred to the pharmacy team for assistance with medication management. The Population Health team is pleased to engage with this patient at any time in the future upon receipt of referral and should he/she be interested in assistance from the [employer] [facility] team.  Matthew Fitzpatrick , RMA     [facility]  [facility], [facility] Guide  Direct Dial: [phone]  Website: [email or web address]

## 2023-05-28 ENCOUNTER — Other Ambulatory Visit: Payer: Self-pay

## 2023-05-28 MED ORDER — ROSUVASTATIN CALCIUM 20 MG PO TABS: 20.0000 mg | ORAL_TABLET | Freq: Every day | ORAL | 1 refills | Status: DC

## 2023-06-12 ENCOUNTER — Ambulatory Visit (HOSPITAL_BASED_OUTPATIENT_CLINIC_OR_DEPARTMENT_OTHER)
Admission: RE | Admit: 2023-06-12 | Discharge: 2023-06-12 | Disposition: A | Discharge status: Home / Self Care | Source: Ambulatory Visit | Attending: Family | Admitting: Family

## 2023-06-12 ENCOUNTER — Encounter: Payer: Self-pay | Admitting: Family

## 2023-06-12 ENCOUNTER — Telehealth: Payer: Self-pay | Admitting: Family

## 2023-06-12 ENCOUNTER — Ambulatory Visit (INDEPENDENT_AMBULATORY_CARE_PROVIDER_SITE_OTHER): Payer: Medicare Other | Admitting: Family

## 2023-06-12 VITALS — BP 129/75 | HR 81 | Temp 98.5°F | Resp 16 | Ht 68.0 in | Wt 238.0 lb

## 2023-06-12 DIAGNOSIS — R052 Subacute cough: Secondary | ICD-10-CM | POA: Insufficient documentation

## 2023-06-12 DIAGNOSIS — A539 Syphilis, unspecified: Secondary | ICD-10-CM

## 2023-06-12 DIAGNOSIS — R0989 Other specified symptoms and signs involving the circulatory and respiratory systems: Secondary | ICD-10-CM | POA: Diagnosis not present

## 2023-06-12 DIAGNOSIS — B20 Human immunodeficiency virus [HIV] disease: Secondary | ICD-10-CM

## 2023-06-12 DIAGNOSIS — H209 Unspecified iridocyclitis: Secondary | ICD-10-CM | POA: Diagnosis not present

## 2023-06-12 DIAGNOSIS — G4700 Insomnia, unspecified: Secondary | ICD-10-CM | POA: Diagnosis not present

## 2023-06-12 DIAGNOSIS — E119 Type 2 diabetes mellitus without complications: Secondary | ICD-10-CM | POA: Diagnosis not present

## 2023-06-12 DIAGNOSIS — Z794 Long term (current) use of insulin: Secondary | ICD-10-CM

## 2023-06-12 DIAGNOSIS — E785 Hyperlipidemia, unspecified: Secondary | ICD-10-CM

## 2023-06-12 DIAGNOSIS — R059 Cough, unspecified: Secondary | ICD-10-CM | POA: Diagnosis not present

## 2023-06-12 DIAGNOSIS — Z227 Latent tuberculosis: Secondary | ICD-10-CM

## 2023-06-12 LAB — BASIC METABOLIC PANEL WITH GFR
BUN: 9 mg/dL (ref 6–23)
CO2: 29 meq/L (ref 19–32)
Calcium: 9.1 mg/dL (ref 8.4–10.5)
Chloride: 105 meq/L (ref 96–112)
Creatinine, Ser: 1.02 mg/dL (ref 0.40–1.50)
GFR: 89.94 mL/min (ref >60.00)
Glucose, Bld: 127 mg/dL — ABNORMAL HIGH (ref 70–99)
Potassium: 4.2 meq/L (ref 3.5–5.1)
Sodium: 138 meq/L (ref 135–145)

## 2023-06-12 LAB — HEMOGLOBIN A1C: Hgb A1c MFr Bld: 6.7 % — ABNORMAL HIGH (ref 4.6–6.5)

## 2023-06-12 MED ORDER — FREESTYLE LIBRE 3 SENSOR MISC
4 refills | Status: DC
Start: 2023-06-12 — End: 2023-06-14

## 2023-06-12 NOTE — Progress Notes (Signed)
 Subjective:     Patient ID: Matthew Fitzpatrick, male    DOB: [birth year], 43 y.o.   MRN: [id number]  Chief Complaint  Patient presents with   Diabetes    Here for follow up   Hyperlipidemia    Here for follow up    HPI  Discussed the use of AI scribe software for clinical note transcription with the patient, who gave verbal consent to proceed.  History of Present Illness  Matthew Fitzpatrick "Matthew Fitzpatrick" is a 43 year old male with HIV who presents for follow up today. He is accompanied by a sign language interpretor. He reports a persistent cough x 2 months and issues managing diabetes.  He has had a persistent cough for two months without associated symptoms such as allergies, post-nasal drip, heartburn, or acid reflux. He recently took Mucinex but is unsure of the correct dosage.  He is experiencing difficulties managing his diabetes. His glucose monitor from the hospital is not functioning, and he is unfamiliar with its operation. Previously, he used a glucose monitor attached to his arm, which is no longer available. He is eating less, sometimes only one or two meals daily, and believes he is losing weight, with a decrease from 280 to 238 pounds. He alternates between 15 and 20 units of insulin but is unsure of the appropriate dose.  He has HIV and is stable on his medication with no current issues. There are no mood concerns or depression, and he describes his sleep as 'so, so'.  He is concerned about potential insurance coverage lapsing in the fall, which may affect his medication access.     Health Maintenance Due  Topic Date Due   DTaP/Tdap/Td (1 - Tdap) Never done   Pneumococcal Vaccine 34-81 Years old (3 of 3 - PCV) 11/03/2010   Medicare Annual Wellness (AWV)  08/14/2018   OPHTHALMOLOGY EXAM  03/29/2023    Past Medical History:  Diagnosis Date   Acute conjunctivitis 02/12/2014   Acute gastroenteritis 08/12/2015   ANXIETY 03/02/2006   Qualifier: Diagnosis of   By: Orvan Falconer MD, [name]     Bronchitis 01/26/2011   CAP (community acquired pneumonia)    CHICKENPOX, HX OF 06/20/2006   Annotation: age 63 Qualifier: Diagnosis of  By: Orvan Falconer MD, [name]     Chlamydia infection 03/29/2018   Constipated 01/16/2023   COUGH, CHRONIC 12/23/2008   Qualifier: Diagnosis of  By: Orvan Falconer MD, [name]     Deaf    DEAFNESS, 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   Qualifier: Diagnosis of  By: Orvan Falconer MD, [name]     DEPRESSION 03/02/2006   Qualifier: Diagnosis of  By: Orvan Falconer MD, [name]     diabetes type 2 08/18/2020   Dyslipidemia 07/02/2012   ERECTILE DYSFUNCTION, 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   Qualifier: Diagnosis of  By: Orvan Falconer MD, [name]     FACIAL RASH 11/17/2008   Qualifier: Diagnosis of  By: Orvan Falconer MD, [name]     Fatty liver    GERD 07/20/2009   Qualifier: Diagnosis of  By: Orvan Falconer MD, [name]     GERD (gastroesophageal reflux disease)    HIV disease (HCC)    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   Monkeypox    Nocturia 08/16/2022   Obesity (BMI 30.0-34.9) 04/20/2016   PEDICULOSIS PUBIS (PUBIC LOUSE) 06/20/2006   Annotation: 4/06 Qualifier: Diagnosis of  By: Orvan Falconer MD, [name]     Poor dentition 09/21/2016   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, 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 Qualifier: Diagnosis of  By: Orvan Falconer MD, Jonny Ruiz  Pulmonary nodule    SINUSITIS, CHRONIC 02/18/2009   Qualifier: Diagnosis of  By: Ernie Heal MD, [name]     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   Urinary frequency 04/06/2009   Qualifier: Diagnosis of  By: Daina Drum MD, [name]      Past Surgical History:  Procedure Laterality Date   COLONOSCOPY     ESOPHAGOGASTRODUODENOSCOPY     FLEXIBLE SIGMOIDOSCOPY     THROAT SURGERY     UMBILICAL HERNIA REPAIR      Family History  Problem Relation Age of Onset   Headache Mother    Hypertension Mother    Heart attack Father 6   Sudden Cardiac Death Neg Hx    Colon cancer Neg Hx    Liver disease Neg Hx    Esophageal cancer Neg Hx    Rectal cancer Neg Hx    Stomach cancer Neg Hx     Social  History   Socioeconomic History   Marital status: Single    Spouse name: Not on file   Number of children: 0   Years of education: Not on file   Highest education level: Not on file  Occupational History   Not on file  Tobacco Use   Smoking status: Never    Passive exposure: Never   Smokeless tobacco: Never  Vaping Use   Vaping status: Never Used  Substance and Sexual Activity   Alcohol use: No    Alcohol/week: 0.0 standard drinks of alcohol   Drug use: No   Sexual activity: Not Currently    Partners: Male    Comment: declined condoms  Other Topics Concern   Not on file  Social History Narrative   Lives alone   Mom lives with his sister in [place]   No children   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 [place]   Enjoys working on Environmental manager games   No pets   Social Drivers of Corporate investment banker Strain: Not on file  Food Insecurity: Food Insecurity Present (08/23/2022)   Hunger Vital Sign    Worried About Running Out of Food in the Last Year: Often true    Ran Out of Food in the Last Year: Often true  Transportation Needs: Unmet Transportation Needs (08/23/2022)   PRAPARE - Administrator, Civil Service (Medical): Yes    Lack of Transportation (Non-Medical): Yes  Physical Activity: Not on file  Stress: Not on file  Social Connections: Not on file  Intimate Partner Violence: Not At Risk (08/23/2022)   Humiliation, Afraid, Rape, and Kick questionnaire    Fear of Current or Ex-Partner: No    Emotionally Abused: No    Physically Abused: No    Sexually Abused: No    Outpatient Medications Prior to Visit  Medication Sig Dispense Refill   bictegravir-emtricitabine-tenofovir AF (BIKTARVY) 50-200-25 MG TABS tablet Take 1 tablet by mouth daily. 30 tablet 11   Blood Glucose Monitoring Suppl (ACCU-CHEK GUIDE) w/Device KIT Use to test blood sugars 3 times a day (in the morning, at noon and bedtime) 1 kit 0   insulin glargine (LANTUS) 100  UNIT/ML Solostar Pen Inject 15 Units into the skin at bedtime. 15 mL 11   Insulin Pen Needle 31G X 5 MM MISC Use to inject insulin daily 100 each 0   rosuvastatin (CRESTOR) 20 MG tablet Take 1 tablet (20 mg total) by mouth daily. 90 tablet 1   Continuous Glucose Sensor (FREESTYLE LIBRE 3 SENSOR) MISC Apply one sensor to upper  arm every 14 days 12 each 4   No facility-administered medications prior to visit.    Allergies  Allergen Reactions   Menthol     "Blacks out"    ROS See HPI    Objective:    Physical Exam Constitutional:      General: He is not in acute distress.    Appearance: He is well-developed.  HENT:     Head: Normocephalic and atraumatic.  Cardiovascular:     Rate and Rhythm: Normal rate and regular rhythm.     Heart sounds: No murmur heard. Pulmonary:     Effort: Pulmonary effort is normal. No respiratory distress.     Breath sounds: Normal breath sounds. No wheezing or rales.  Skin:    General: Skin is warm and dry.  Neurological:     Mental Status: He is alert and oriented to person, place, and time.  Psychiatric:        Behavior: Behavior normal.        Thought Content: Thought content normal.      BP 129/75 (BP Location: Right Arm, Patient Position: Sitting, Cuff Size: Large)   Pulse 81   Temp 98.5 F (36.9 C) (Oral)   Resp 16   Ht 5\' 8"  (1.727 m)   Wt 238 lb (108 kg)   SpO2 95%   BMI 36.19 kg/m  Wt Readings from Last 3 Encounters:  06/12/23 238 lb (108 kg)  03/12/23 252 lb (114.3 kg)  01/22/23 244 lb (110.7 kg)       Assessment & Plan:   Problem List Items Addressed This Visit       Unprioritized   Syphilis   Titer decreased from 1:8 to 1:4 following retreatment.        Subacute cough   Chronic cough for two months without allergy, drainage, heartburn, or reflux symptoms. Differential includes post-infectious cough, asthma, or other pulmonary conditions. - Order chest x-ray. - Consider pulmonologist referral if symptoms  persist.      Relevant Orders   DG Chest 2 View   Latent tuberculosis by blood test   It is unclear if this has ever been treated.  Will reach out to the Health Department to see if they have record of treatment. If not, will plan to repeat TB gold and refer to Health Department.      Iridocyclitis   He has not seen Dr. Candi Chafe in >1 year. Will arrange follow up visit.      Insomnia   Stable without medication.      Hyperlipidemia - Primary   Lab Results  Component Value Date   CHOL 169 03/12/2023   HDL 50.20 03/12/2023   LDLCALC 83 03/12/2023   LDLDIRECT 169.0 10/20/2022   TRIG 180.0 (H) 03/12/2023   CHOLHDL 3 03/12/2023   Continues crestor.        Human immunodeficiency virus (HIV) disease (HCC)   HIV RNA levels have been nearly undetectable- maintained on Biktarvy. Management per ID.       Diabetes mellitus type 2, controlled (HCC)    Difficulty managing diabetes due to glucose monitoring issues. Not using glucose meter due to lack of understanding and insurance problems. Weight loss noted, possibly from dietary changes. Insulin dosing varies between 15-20 units. - Resend glucose meter prescription to pharmacy. - Offer nurse visit for glucose meter assistance. - Check A1c and kidney function. - Refer to Child psychotherapist for insurance assistance.      Relevant Medications   Continuous Glucose  Sensor (FREESTYLE LIBRE 3 SENSOR) MISC   Other Relevant Orders   HgB A1c   AMB Referral VBCI Care Management   Ambulatory referral to Ophthalmology   Basic Metabolic Panel (BMET)    I am having Gustaf L. Gosselin "[name]" maintain his Accu-Chek Guide, Biktarvy, Insulin Pen Needle, insulin glargine, rosuvastatin, and FreeStyle Libre 3 Sensor.  Meds ordered this encounter  Medications   Continuous Glucose Sensor (FREESTYLE LIBRE 3 SENSOR) MISC    Sig: Apply one sensor to upper arm every 14 days    Dispense:  12 each    Refill:  4    Supervising Provider:   Randie Bustle A  [[id number]]

## 2023-06-12 NOTE — Assessment & Plan Note (Signed)
 Titer decreased from 1:8 to 1:4 following retreatment.

## 2023-06-12 NOTE — Telephone Encounter (Signed)
 Thank you. Would you mind faxing the lab report from 2021 (+ TB Gold) that is in the labs at [facility] and filling the disease reporting form?

## 2023-06-12 NOTE — Assessment & Plan Note (Signed)
 It is unclear if this has ever been treated.  Will reach out to the Health Department to see if they have record of treatment. If not, will plan to repeat TB gold and refer to Health Department.

## 2023-06-12 NOTE — Assessment & Plan Note (Signed)
 Stable without medication

## 2023-06-12 NOTE — Assessment & Plan Note (Signed)
 He has not seen Dr. Candi Chafe in >1 year. Will arrange follow up visit.

## 2023-06-12 NOTE — Telephone Encounter (Signed)
 Spoke with Tammy at [facility]. Tammy states there is no record of patient receiving treatment for +TB.  She offered if provider would like patient referred to the TB Clinic, the Disease Reporting Form and Lab report with value will need to be faxed to [phone] or [phone].

## 2023-06-12 NOTE — Assessment & Plan Note (Signed)
 HIV RNA levels have been nearly undetectable- maintained on Biktarvy. Management per ID.

## 2023-06-12 NOTE — Assessment & Plan Note (Addendum)
 Chronic cough for two months without allergy, drainage, heartburn, or reflux symptoms. Differential includes post-infectious cough, asthma, or other pulmonary conditions. - Order chest x-ray. - Consider pulmonologist referral if symptoms persist.

## 2023-06-12 NOTE — Telephone Encounter (Signed)
 Communicable Disease reporting form completed and faxed with 2021 labs to [facility].

## 2023-06-12 NOTE — Patient Instructions (Signed)
 VISIT SUMMARY:  Today, you were seen for a persistent cough and issues managing your diabetes. We also reviewed your HIV management and general health maintenance.  YOUR PLAN:  -CHRONIC COUGH: A chronic cough lasting more than two months can be due to various reasons such as a lingering infection, asthma, or other lung conditions. We will order a chest x-ray to investigate further and may refer you to a lung specialist if the symptoms continue.  -DIABETES MELLITUS: Diabetes is a condition where your blood sugar levels are too high. You are having trouble managing your diabetes due to issues with your glucose monitor and insulin dosing. We will resend the prescription for your glucose meter to the pharmacy and arrange for a nurse to help you understand how to use it. We will also check your A1c and kidney function, and refer you to a social worker to help with your insurance concerns.  -HIV: HIV is a virus that attacks the immune system. Your HIV is well-controlled with your current medication, and there are no issues with adherence or side effects.  -GENERAL HEALTH MAINTENANCE: You have lost weight, likely due to changes in your diet. Your last eye exam was a year ago, so we will refer you for another eye exam with Dr. Candi Chafe.  INSTRUCTIONS:  Please schedule a follow-up appointment in three months. We will also arrange for lab work today and send updates via MyChart.

## 2023-06-12 NOTE — Assessment & Plan Note (Signed)
  Difficulty managing diabetes due to glucose monitoring issues. Not using glucose meter due to lack of understanding and insurance problems. Weight loss noted, possibly from dietary changes. Insulin dosing varies between 15-20 units. - Resend glucose meter prescription to pharmacy. - Offer nurse visit for glucose meter assistance. - Check A1c and kidney function. - Refer to Child psychotherapist for insurance assistance.

## 2023-06-12 NOTE — Assessment & Plan Note (Signed)
 Lab Results  Component Value Date   CHOL 169 03/12/2023   HDL 50.20 03/12/2023   LDLCALC 83 03/12/2023   LDLDIRECT 169.0 10/20/2022   TRIG 180.0 (H) 03/12/2023   CHOLHDL 3 03/12/2023   Continues crestor.

## 2023-06-12 NOTE — Telephone Encounter (Signed)
 LM with Tammy at [facility] Dept, TB control clinic, requesting call back.

## 2023-06-12 NOTE — Telephone Encounter (Signed)
 Could you please do me a favor, can you call the [facility] department and ask them if they have any record of this patient receiving treatment for + TB gold test in 2021?

## 2023-06-13 ENCOUNTER — Telehealth: Payer: Self-pay | Admitting: Family

## 2023-06-13 NOTE — Telephone Encounter (Signed)
 Copied from CRM [id number]. Topic: Medical Record Request - Provider/Facility Request >> Jun 13, 2023  3:05 PM [place] J wrote: Reason for CRM: Matthew Fitzpatrick calling in from [facility] Department letting us  know that the TB test results we were trying to send over was not completely received. She stated that the only thing sent through was the cover sheet and nothing else. She would like if we could re-fax.

## 2023-06-14 ENCOUNTER — Other Ambulatory Visit: Payer: Self-pay | Admitting: *Deleted

## 2023-06-14 MED ORDER — FREESTYLE LIBRE 3 PLUS SENSOR MISC
2 refills | Status: DC
Start: 2023-06-14 — End: 2023-12-12

## 2023-06-18 ENCOUNTER — Telehealth: Payer: Self-pay

## 2023-06-21 NOTE — Progress Notes (Signed)
 Complex Care Management Note Care Guide Note  06/25/2023 Name: Matthew Fitzpatrick MRN: [id number] DOB: 09-14-1980   Complex Care Management Outreach Attempts: A third unsuccessful outreach was attempted today to offer the patient with information about available complex care management services.  Follow Up Plan:  No further outreach attempts will be made at this time. We have been unable to contact the patient to offer or enroll patient in complex care management services.  Encounter Outcome:  No Answer  [facility]  [facility], [facility] Health Care Management Assistant Direct Dial: [phone]  Fax: [phone]

## 2023-06-24 ENCOUNTER — Encounter: Payer: Self-pay | Admitting: Family

## 2023-06-25 NOTE — Telephone Encounter (Signed)
 Paperwork given to [employer], CMA on 06/13/23 to re-fax.

## 2023-06-26 ENCOUNTER — Telehealth: Payer: Self-pay | Admitting: Family

## 2023-06-26 NOTE — Telephone Encounter (Signed)
-----   Message from Nurse Jullie Oiler B sent at 06/26/2023 12:05 PM EDT ----- Yes. Matthew Fitzpatrick followed up for me since the fax failed. She confirmed it did go through to the Health Dept. ----- Message ----- From: Matthew Gaucher, NP Sent: 06/24/2023   5:01 PM EDT To: Matthew Lapine, RN  Hi Kim,  I saw a note on this pt about a fax issue at the Health Dept and just wanted to make sure everything went through.  Thanks!

## 2023-06-27 ENCOUNTER — Other Ambulatory Visit: Payer: Self-pay

## 2023-06-27 DIAGNOSIS — B20 Human immunodeficiency virus [HIV] disease: Secondary | ICD-10-CM

## 2023-06-27 DIAGNOSIS — Z113 Encounter for screening for infections with a predominantly sexual mode of transmission: Secondary | ICD-10-CM

## 2023-06-27 DIAGNOSIS — Z79899 Other long term (current) drug therapy: Secondary | ICD-10-CM

## 2023-07-03 ENCOUNTER — Other Ambulatory Visit: Payer: Self-pay

## 2023-07-03 DIAGNOSIS — Z79899 Other long term (current) drug therapy: Secondary | ICD-10-CM

## 2023-07-03 DIAGNOSIS — B20 Human immunodeficiency virus [HIV] disease: Secondary | ICD-10-CM

## 2023-07-03 DIAGNOSIS — Z1212 Encounter for screening for malignant neoplasm of rectum: Secondary | ICD-10-CM | POA: Diagnosis not present

## 2023-07-03 DIAGNOSIS — Z113 Encounter for screening for infections with a predominantly sexual mode of transmission: Secondary | ICD-10-CM

## 2023-07-04 LAB — C. TRACHOMATIS/N. GONORRHOEAE RNA
C. trachomatis RNA, TMA: NOT DETECTED
N. gonorrhoeae RNA, TMA: NOT DETECTED

## 2023-07-06 LAB — T-HELPER CELLS (CD4) COUNT (NOT AT ARMC)
Absolute CD4: 1824 {cells}/uL — ABNORMAL HIGH (ref 490–1740)
CD4 T Helper %: 42 % (ref 30–61)
Total lymphocyte count: 4374 {cells}/uL — ABNORMAL HIGH (ref 850–3900)

## 2023-07-06 LAB — COMPLETE METABOLIC PANEL WITHOUT GFR
AG Ratio: 1.4 (calc) (ref 1.0–2.5)
ALT: 32 U/L (ref 9–46)
AST: 22 U/L (ref 10–40)
Albumin: 4.4 g/dL (ref 3.6–5.1)
Alkaline phosphatase (APISO): 107 U/L (ref 36–130)
BUN: 13 mg/dL (ref 7–25)
CO2: 27 mmol/L (ref 20–32)
Calcium: 9 mg/dL (ref 8.6–10.3)
Chloride: 98 mmol/L (ref 98–110)
Creat: 0.86 mg/dL (ref 0.60–1.29)
Globulin: 3.1 g/dL (ref 1.9–3.7)
Glucose, Bld: 219 mg/dL — ABNORMAL HIGH (ref 65–99)
Potassium: 3.8 mmol/L (ref 3.5–5.3)
Sodium: 135 mmol/L (ref 135–146)
Total Bilirubin: 0.3 mg/dL (ref 0.2–1.2)
Total Protein: 7.5 g/dL (ref 6.1–8.1)

## 2023-07-06 LAB — CBC WITH DIFFERENTIAL/PLATELET
Absolute Lymphocytes: 4140 {cells}/uL — ABNORMAL HIGH (ref 850–3900)
Absolute Monocytes: 920 {cells}/uL (ref 200–950)
Basophils Absolute: 92 {cells}/uL (ref 0–200)
Basophils Relative: 1 %
Eosinophils Absolute: 166 {cells}/uL (ref 15–500)
Eosinophils Relative: 1.8 %
HCT: 41.3 % (ref 38.5–50.0)
Hemoglobin: 13.7 g/dL (ref 13.2–17.1)
MCH: 30.6 pg (ref 27.0–33.0)
MCHC: 33.2 g/dL (ref 32.0–36.0)
MCV: 92.2 fL (ref 80.0–100.0)
MPV: 10.3 fL (ref 7.5–12.5)
Monocytes Relative: 10 %
Neutro Abs: 3882 {cells}/uL (ref 1500–7800)
Neutrophils Relative %: 42.2 %
Platelets: 253 10*3/uL (ref 140–400)
RBC: 4.48 10*6/uL (ref 4.20–5.80)
RDW: 12.8 % (ref 11.0–15.0)
Total Lymphocyte: 45 %
WBC: 9.2 10*3/uL (ref 3.8–10.8)

## 2023-07-06 LAB — HIV-1 RNA QUANT-NO REFLEX-BLD
HIV 1 RNA Quant: <20 {copies}/mL — AB
HIV-1 RNA Quant, Log: <1.3 {Log_copies}/mL — AB

## 2023-07-06 LAB — T PALLIDUM AB: T Pallidum Abs: POSITIVE — AB

## 2023-07-06 LAB — RPR: RPR Ser Ql: REACTIVE — AB

## 2023-07-06 LAB — RPR TITER: RPR Titer: 1:4 {titer} — ABNORMAL HIGH

## 2023-07-16 NOTE — Progress Notes (Signed)
 Subjective:   Chief complaint: follow-up for HIV disease on medications   Patient ID: Matthew Fitzpatrick, male    DOB: 29-Aug-1980, 43 y.o.   MRN: [id number]  HPI  Discussed the use of AI scribe software for clinical note transcription with the patient, who gave verbal consent to proceed.  History of Present Illness   Matthew Domine "[name]" is a 43 year old male with HIV who presents with sleep disturbances. He was asked by Dr. Vira Grieves for evaluation of sleep disturbances potentially related to Biktarvy .  He experiences persistent difficulty sleeping at night and has tried various sleep medications without success. He takes Biktarvy  in the morning and has been on it for a long time without prior sleep issues. He is doubtful that his current sleep problems are related to his medication.  He has  diabetes and is currently on insulin . He also takes Crestor  for cholesterol management. 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 He is concerned about feeling groggy in the morning if he takes sleep medication, as it would affect his ability to work.  Previously, he experienced constipation, which has since resolved. He has a history of syphilis, which has been successfully treated. Recent lab results show a viral load of less than twenty, a CD4 count of 1824, and normal kidney and liver function.       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   Qualifier: Diagnosis of  By: Daina Drum MD, [name]     DEPRESSION 03/02/2006   Qualifier: Diagnosis of  By: Daina Drum MD, [name]     diabetes type 2 08/18/2020   Dyslipidemia 07/02/2012   ERECTILE DYSFUNCTION, 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   Qualifier: Diagnosis of  By: Daina Drum MD, [name]     FACIAL RASH 11/17/2008   Qualifier: Diagnosis of  By: Daina Drum MD, [name]     Fatty liver    GERD 07/20/2009   Qualifier: Diagnosis of  By: Daina Drum MD, [name]     GERD (gastroesophageal reflux disease)    HIV disease (HCC)    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   Monkeypox    Nocturia 08/16/2022   Obesity (BMI 30.0-34.9) 04/20/2016   PEDICULOSIS PUBIS (PUBIC LOUSE) 06/20/2006   Annotation: 4/06 Qualifier: Diagnosis of  By: Daina Drum MD, [name]     Poor dentition 09/21/2016   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, 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 Qualifier: Diagnosis of  By: Daina Drum MD, [name]     Pulmonary nodule    SINUSITIS, CHRONIC 02/18/2009   Qualifier: Diagnosis of  By: Ernie Heal MD, Naina Sleeper     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   Urinary frequency 04/06/2009   Qualifier: Diagnosis of  By: Daina Drum MD, [name]      Past Surgical History:  Procedure Laterality Date   COLONOSCOPY     ESOPHAGOGASTRODUODENOSCOPY     FLEXIBLE SIGMOIDOSCOPY     THROAT SURGERY     UMBILICAL HERNIA  REPAIR      Family History  Problem Relation Age of Onset   Headache Mother    Hypertension Mother    Heart attack Father 49   Sudden Cardiac Death Neg Hx    Colon cancer Neg Hx    Liver disease Neg Hx    Esophageal cancer Neg Hx    Rectal cancer Neg Hx    Stomach cancer Neg Hx       Social History   Socioeconomic History   Marital status: Single    Spouse name: Not on file   Number of children: 0   Years of education: Not on file   Highest education level: Not on file  Occupational History   Not on file  Tobacco Use   Smoking status: Never    Passive exposure: Never   Smokeless tobacco: Never  Vaping Use   Vaping status: Never Used   Substance and Sexual Activity   Alcohol use: No    Alcohol/week: 0.0 standard drinks of alcohol   Drug use: No   Sexual activity: Not Currently    Partners: Male    Comment: declined condoms  Other Topics Concern   Not on file  Social History Narrative   Lives alone   Mom lives with his sister in [place]   No children   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 [place]   Enjoys working on Environmental manager games   No pets   Social Drivers of Corporate investment banker Strain: Not on file  Food Insecurity: Food Insecurity Present (08/23/2022)   Hunger Vital Sign    Worried About Running Out of Food in the Last Year: Often true    Ran Out of Food in the Last Year: Often true  Transportation Needs: Unmet Transportation Needs (08/23/2022)   PRAPARE - Administrator, Civil Service (Medical): Yes    Lack of Transportation (Non-Medical): Yes  Physical Activity: Not on file  Stress: Not on file  Social Connections: Not on file    Allergies  Allergen Reactions   Menthol     "Blacks out"     Current Outpatient Medications:    bictegravir-emtricitabine -tenofovir  AF (BIKTARVY ) 50-200-25 MG TABS tablet, Take 1 tablet by mouth daily., Disp: 30 tablet, Rfl: 11   Blood Glucose Monitoring Suppl (ACCU-CHEK GUIDE) w/Device KIT, Use to test blood sugars 3 times a day (in the morning, at noon and bedtime), Disp: 1 kit, Rfl: 0   Continuous Glucose Sensor (FREESTYLE LIBRE 3 PLUS SENSOR) MISC, Change sensor every 15 days., Disp: 2 each, Rfl: 2   insulin  glargine (LANTUS ) 100 UNIT/ML Solostar Pen, Inject 15 Units into the skin at bedtime., Disp: 15 mL, Rfl: 11   Insulin  Pen Needle 31G X 5 MM MISC, Use to inject insulin  daily, Disp: 100 each, Rfl: 0   rosuvastatin  (CRESTOR ) 20 MG tablet, Take 1 tablet (20 mg total) by mouth daily., Disp: 90 tablet, Rfl: 1   Review of Systems  Constitutional:  Negative for activity change, appetite change, chills, diaphoresis, fatigue, fever  and unexpected weight change.  HENT:  Negative for congestion, rhinorrhea, sinus pressure, sneezing, sore throat and trouble swallowing.   Eyes:  Negative for photophobia and visual disturbance.  Respiratory:  Negative for cough, chest tightness, shortness of breath, wheezing and stridor.   Cardiovascular:  Negative for chest pain, palpitations and leg swelling.  Gastrointestinal:  Negative for abdominal distention, abdominal pain, anal bleeding, blood in stool, constipation, diarrhea, nausea and vomiting.  Genitourinary:  Negative for difficulty urinating, dysuria, flank pain and hematuria.  Musculoskeletal:  Negative for arthralgias, back pain, gait problem, joint swelling and myalgias.  Skin:  Negative for color change, pallor, rash and wound.  Neurological:  Negative for dizziness, tremors, weakness and light-headedness.  Hematological:  Negative for adenopathy. Does not bruise/bleed easily.  Psychiatric/Behavioral:  Negative for agitation, behavioral problems, confusion, decreased concentration, dysphoric mood and sleep disturbance.        Objective:   Physical Exam Constitutional:      Appearance: He is well-developed.  HENT:     Head: Normocephalic and atraumatic.  Eyes:     Conjunctiva/sclera: Conjunctivae normal.  Cardiovascular:     Rate and Rhythm: Normal rate and regular rhythm.  Pulmonary:     Effort: Pulmonary effort is normal. No respiratory distress.     Breath sounds: No wheezing.  Abdominal:     General: There is no distension.     Palpations: Abdomen is soft.  Musculoskeletal:        General: No tenderness. Normal range of motion.     Cervical back: Normal range of motion and neck supple.  Skin:    General: Skin is warm and dry.     Coloration: Skin is not pale.     Findings: No erythema or rash.  Neurological:     General: No focal deficit present.     Mental Status: He is alert and oriented to person, place, and time.  Psychiatric:        Mood and  Affect: Mood normal.        Behavior: Behavior normal.        Thought Content: Thought content normal.        Judgment: Judgment normal.           Assessment & Plan:   Assessment and Plan    HIV infection, undetectable viral load Labs: Viral load: <20 copies/mL CD4 count: 1824 cells/L Kidney function: normal Liver function: normal Gonorrhea: negative Chlamydia: negative HIV infection with undetectable viral load and healthy CD4 count. Biktarvy  is effective and well-tolerated. Advised to continue unless insomnia becomes intolerable, then consider alternative antiretroviral class. Up to date on vaccinations, no current STIs. - Continue Biktarvy .   Rectal cancer screening: - Perform anal Pap smear for HPV screening.  Insomnia Chronic insomnia, recent onset, unresponsive to OTC medications. Biktarvy  unlikely culprit. Advised to consult primary care for management. - Consult primary care physician for insomnia management.  Type 2 diabetes mellitus Managed with insulin  (Lantus ).  Follow-up Advised follow-up in six months for routine HIV monitoring. - Schedule follow-up appointment in six months.

## 2023-07-17 ENCOUNTER — Other Ambulatory Visit: Payer: Self-pay

## 2023-07-17 ENCOUNTER — Other Ambulatory Visit (HOSPITAL_COMMUNITY)
Admission: RE | Admit: 2023-07-17 | Discharge: 2023-07-17 | Disposition: A | Discharge status: Home / Self Care | Source: Ambulatory Visit | Attending: Infectious Disease | Admitting: Infectious Disease

## 2023-07-17 ENCOUNTER — Encounter: Payer: Self-pay | Admitting: Infectious Disease

## 2023-07-17 ENCOUNTER — Ambulatory Visit (INDEPENDENT_AMBULATORY_CARE_PROVIDER_SITE_OTHER): Payer: Self-pay | Admitting: Infectious Disease

## 2023-07-17 VITALS — BP 131/86 | HR 67 | Resp 16 | Ht 68.0 in | Wt 242.0 lb

## 2023-07-17 DIAGNOSIS — K5900 Constipation, unspecified: Secondary | ICD-10-CM

## 2023-07-17 DIAGNOSIS — G4700 Insomnia, unspecified: Secondary | ICD-10-CM

## 2023-07-17 DIAGNOSIS — E785 Hyperlipidemia, unspecified: Secondary | ICD-10-CM

## 2023-07-17 DIAGNOSIS — B20 Human immunodeficiency virus [HIV] disease: Secondary | ICD-10-CM

## 2023-07-17 DIAGNOSIS — Z1212 Encounter for screening for malignant neoplasm of rectum: Secondary | ICD-10-CM | POA: Diagnosis not present

## 2023-07-17 DIAGNOSIS — E119 Type 2 diabetes mellitus without complications: Secondary | ICD-10-CM

## 2023-07-17 DIAGNOSIS — Z794 Long term (current) use of insulin: Secondary | ICD-10-CM

## 2023-07-17 DIAGNOSIS — Z1151 Encounter for screening for human papillomavirus (HPV): Secondary | ICD-10-CM | POA: Diagnosis not present

## 2023-07-17 MED ORDER — BIKTARVY 50-200-25 MG PO TABS: 1.0000 | ORAL_TABLET | Freq: Every day | ORAL | 11 refills | Status: DC

## 2023-07-24 LAB — CYTOLOGY - PAP
Comment: NEGATIVE
Diagnosis: UNDETERMINED — AB
High risk HPV: NEGATIVE

## 2023-07-26 ENCOUNTER — Telehealth: Payer: Self-pay

## 2023-07-26 ENCOUNTER — Other Ambulatory Visit: Payer: Self-pay | Admitting: Infectious Disease

## 2023-07-26 ENCOUNTER — Ambulatory Visit: Payer: Self-pay

## 2023-07-26 ENCOUNTER — Other Ambulatory Visit (HOSPITAL_COMMUNITY): Payer: Self-pay

## 2023-07-26 DIAGNOSIS — R85610 Atypical squamous cells of undetermined significance on cytologic smear of anus (ASC-US): Secondary | ICD-10-CM

## 2023-07-26 NOTE — Telephone Encounter (Signed)
-----   Message from [place] sent at 07/26/2023  4:05 PM EDT ----- As-cus on anal pap so he needds HRA with Dr. Andy Bannister, Referral has been placed ----- Message ----- From: Interface, [facility] In [facility] Sent: 07/24/2023  10:48 AM EDT To: Charolette Copier, MD

## 2023-07-26 NOTE — Telephone Encounter (Signed)
 Copied from CRM [id number]. Topic: Clinical - Prescription Issue >> Jul 26, 2023 10:20 AM Felizardo Hotter wrote: Reason for CRM: Pt called stated that pharmacy said insurance will not cover insulin  glargine (LANTUS ) 100 UNIT/ML Solostar Pen. Pt needs alternate medication that insurance will cover. Pt would a response back via MyChart.

## 2023-07-26 NOTE — Progress Notes (Signed)
 The 10-year ASCVD risk score (Arnett DK, et al., 2019) is: 6.9%   Values used to calculate the score:     Age: 43 years     Sex: Male     Is Non-Hispanic African American: Yes     Diabetic: Yes     Tobacco smoker: No     Systolic Blood Pressure: 131 mmHg     Is BP treated: No     HDL Cholesterol: 50.2 mg/dL     Total Cholesterol: 169 mg/dL  Currently prescribed rosuvastatin  20 mg.  Kairy Folsom, BSN, RN

## 2023-07-26 NOTE — Telephone Encounter (Signed)
 Pharmacy Patient Advocate Encounter   Received notification from Pt Calls Messages that prior authorization for Lantus  Solostar pen is required/requested.   Insurance verification completed.   The patient is insured through [employer] .   Per test claim: Lantus  is not on the formulary. Placed a call to the insurance and per the representative, the Lantus  is not on the formulary but they will pay for the insulin  glargine solostar pen and the copay will be $0.   Spoke with the pharmacy to let them know the insurance will pay for the generic and they changed and was able to get a paid claim and to let the patient know to set up delivery.

## 2023-07-27 NOTE — Telephone Encounter (Signed)
 Pharmacy Patient Advocate Encounter   Received notification from Pt Calls Messages that prior authorization for Lantus  Solostar pen is required/requested.   Insurance verification completed.   The patient is insured through [employer] .   Per test claim: Lantus  is not on the formulary. Placed a call to the insurance and per the representative, the Lantus  is not on the formulary but they will pay for the insulin  glargine solostar pen and the copay will be $0.   Spoke with the pharmacy to let them know the insurance will pay for the generic and they changed and was able to get a paid claim and to let the patient know to set up delivery.

## 2023-08-07 ENCOUNTER — Telehealth: Payer: Self-pay

## 2023-08-07 DIAGNOSIS — E119 Type 2 diabetes mellitus without complications: Secondary | ICD-10-CM

## 2023-08-07 MED ORDER — INSULIN GLARGINE-YFGN 100 UNIT/ML ~~LOC~~ SOPN
15.0000 [IU] | PEN_INJECTOR | Freq: Every day | SUBCUTANEOUS | 0 refills | Status: DC
Start: 2023-08-07 — End: 2023-12-03

## 2023-08-07 NOTE — Addendum Note (Signed)
 Addended by: Dorrene Gaucher on: 08/07/2023 12:59 PM   Modules accepted: Orders

## 2023-08-07 NOTE — Telephone Encounter (Signed)
 Source  [name] "[name]" (Patient)   Subject  [name] "[name]" (Patient)   Topic  Clinical - Prescription Issue    Communication  Reason for CRM: [facility] advise health insurance is discontinuing Lantus  , need a prescription for semglee      Patient Information  Patient Name Gender DOB SSN  Matthew Fitzpatrick, Matthew [name] "[name]" Male 10/01/1980 [id number]   Notes  Samule Crofts   08/01/2023  9:29 AM  alyssa w/[facility] needs script for semglee .. unable to get the lantus   and the generic ( all out of stock / discontined ) pls call [phone].

## 2023-09-11 ENCOUNTER — Encounter: Payer: Self-pay | Admitting: Family

## 2023-09-11 ENCOUNTER — Telehealth: Payer: Self-pay | Admitting: Family

## 2023-09-11 ENCOUNTER — Ambulatory Visit (INDEPENDENT_AMBULATORY_CARE_PROVIDER_SITE_OTHER): Admitting: Family

## 2023-09-11 VITALS — BP 112/79 | HR 66 | Temp 98.4°F | Resp 16 | Ht 68.0 in | Wt 237.0 lb

## 2023-09-11 DIAGNOSIS — R911 Solitary pulmonary nodule: Secondary | ICD-10-CM | POA: Diagnosis not present

## 2023-09-11 DIAGNOSIS — Z794 Long term (current) use of insulin: Secondary | ICD-10-CM | POA: Diagnosis not present

## 2023-09-11 DIAGNOSIS — Z8619 Personal history of other infectious and parasitic diseases: Secondary | ICD-10-CM

## 2023-09-11 DIAGNOSIS — R7612 Nonspecific reaction to cell mediated immunity measurement of gamma interferon antigen response without active tuberculosis: Secondary | ICD-10-CM | POA: Diagnosis not present

## 2023-09-11 DIAGNOSIS — E119 Type 2 diabetes mellitus without complications: Secondary | ICD-10-CM

## 2023-09-11 DIAGNOSIS — E785 Hyperlipidemia, unspecified: Secondary | ICD-10-CM

## 2023-09-11 DIAGNOSIS — B20 Human immunodeficiency virus [HIV] disease: Secondary | ICD-10-CM | POA: Diagnosis not present

## 2023-09-11 DIAGNOSIS — K625 Hemorrhage of anus and rectum: Secondary | ICD-10-CM | POA: Diagnosis not present

## 2023-09-11 LAB — BASIC METABOLIC PANEL WITH GFR
BUN: 9 mg/dL (ref 6–23)
CO2: 30 meq/L (ref 19–32)
Calcium: 9.2 mg/dL (ref 8.4–10.5)
Chloride: 101 meq/L (ref 96–112)
Creatinine, Ser: 0.89 mg/dL (ref 0.40–1.50)
GFR: 104.69 mL/min (ref >60.00)
Glucose, Bld: 259 mg/dL — ABNORMAL HIGH (ref 70–99)
Potassium: 4.2 meq/L (ref 3.5–5.1)
Sodium: 135 meq/L (ref 135–145)

## 2023-09-11 LAB — HEMOGLOBIN A1C: Hgb A1c MFr Bld: 9.7 % — ABNORMAL HIGH (ref 4.6–6.5)

## 2023-09-11 LAB — MICROALBUMIN / CREATININE URINE RATIO
Creatinine,U: 151.7 mg/dL
Microalb Creat Ratio: 5 mg/g (ref 0.0–30.0)
Microalb, Ur: 0.8 mg/dL (ref 0.0–1.9)

## 2023-09-11 NOTE — Assessment & Plan Note (Signed)
 This is being treated with Biktarvy  per ID.

## 2023-09-11 NOTE — Assessment & Plan Note (Signed)
 Lab Results  Component Value Date   HGBA1C 6.7 (H) 06/12/2023   HGBA1C 6.3 03/12/2023   HGBA1C 5.2 01/22/2023   Lab Results  Component Value Date   LDLCALC 83 03/12/2023   CREATININE 0.86 07/03/2023   He is having some issues with his insurance coverage and insulin  but currently has his insulin . He will bring me form from his insurance to review. Continue Semglee .  Update A1C.

## 2023-09-11 NOTE — Telephone Encounter (Signed)
 See mychart.

## 2023-09-11 NOTE — Progress Notes (Signed)
 Subjective:     Patient ID: Matthew Fitzpatrick, male    DOB: 08/04/1980, 43 y.o.   MRN: [id number]  Chief Complaint  Patient presents with   Diabetes    Here for follow up    Diabetes    Discussed the use of AI scribe software for clinical note transcription with the patient, who gave verbal consent to proceed.  History of Present Illness A sign language interpreter is present for today's visit.   Matthew Fitzpatrick is a 43 year old male with diabetes who presents for a follow-up on his medications and blood sugar levels.  He is experiencing issues with medication coverage through his insurance, [employer], and the pharmacy, [facility]. His insulin  was not covered, leading to a three-week period without medication. He has since obtained his insulin , but the most recent refill was not covered by insurance, and he is unsure if he paid cash for it.  He has not been checking his blood sugar levels at home as he did not receive a meter from [facility], possibly due to insurance coverage issues.  He has significant foot pain, described as very sore, exacerbated by standing at work. The pain was severe enough that he quit his job a month ago to rest his feet. He is not interested in seeing a foot specialist at this time.  He has not had an eye exam in the past year due to being busy and is open to scheduling one. No hepatitis B vaccine received. He has had the pneumonia vaccine in the past. He has experienced warts on his hand in the past, which resolved on his own.  Lab Results  Component Value Date   HGBA1C 6.7 (H) 06/12/2023   HGBA1C 6.3 03/12/2023   HGBA1C 5.2 01/22/2023   Lab Results  Component Value Date   LDLCALC 83 03/12/2023   CREATININE 0.86 07/03/2023        Health Maintenance Due  Topic Date Due   DTaP/Tdap/Td (5 - Tdap) 10/26/1990   Diabetic kidney evaluation - Urine ACR  Never done   Medicare Annual Wellness (AWV)  08/14/2018   OPHTHALMOLOGY  EXAM  03/29/2023   COVID-19 Vaccine (6 - Pfizer risk 2024-25 season) 07/16/2023    Past Medical History:  Diagnosis Date   Acute conjunctivitis 02/12/2014   Acute gastroenteritis 08/12/2015   ANXIETY 03/02/2006   Qualifier: Diagnosis of  By: Elaine MD, [name]     Bronchitis 01/26/2011   CAP (community acquired pneumonia)    CHICKENPOX, HX OF 06/20/2006   Annotation: age 46 Qualifier: Diagnosis of  By: Elaine MD, [name]     Chlamydia infection 03/29/2018   Constipated 01/16/2023   COUGH, CHRONIC 12/23/2008   Qualifier: Diagnosis of  By: Elaine MD, [name]     Deaf    DEAFNESS, 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   Qualifier: Diagnosis of  By: Elaine MD, [name]     DEPRESSION 03/02/2006   Qualifier: Diagnosis of  By: Elaine MD, [name]     diabetes type 2 08/18/2020   Dyslipidemia 07/02/2012   ERECTILE DYSFUNCTION, 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   Qualifier: Diagnosis of  By: Elaine MD, [name]     FACIAL RASH 11/17/2008   Qualifier: Diagnosis of  By: Elaine MD, [name]     Fatty liver    GERD 07/20/2009   Qualifier: Diagnosis of  By: Elaine MD, [name]     GERD (gastroesophageal reflux disease)    HIV disease (HCC)    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 No follow-up needed if patient is low-risk.      SINUSITIS, CHRONIC 02/18/2009   Qualifier: Diagnosis of  By: Fleeta Rothman MD, [name]     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   Urinary frequency 04/06/2009   Qualifier: Diagnosis of  By: Elaine MD, [name]      Past Surgical History:  Procedure Laterality Date   COLONOSCOPY      ESOPHAGOGASTRODUODENOSCOPY     FLEXIBLE SIGMOIDOSCOPY     THROAT SURGERY     UMBILICAL HERNIA REPAIR      Family History  Problem Relation Age of Onset   Headache Mother    Hypertension Mother    Heart attack Father 56   Sudden Cardiac Death Neg Hx    Colon cancer Neg Hx    Liver disease Neg Hx    Esophageal cancer Neg Hx    Rectal cancer Neg Hx    Stomach cancer Neg Hx     Social History   Socioeconomic History   Marital status: Single    Spouse name: Not on file   Number of children: 0   Years of education: Not on file   Highest education level: Not on file  Occupational History   Not on file  Tobacco Use   Smoking status: Never    Passive exposure: Never   Smokeless tobacco: Never  Vaping Use   Vaping status: Never Used  Substance and Sexual Activity   Alcohol use: No    Alcohol/week: 0.0 standard drinks of alcohol   Drug use: No   Sexual activity: Not Currently    Partners: Male    Comment: declined condoms  Other Topics Concern   Not on file  Social History Narrative   Lives alone   Mom lives with his sister in [place]   No children   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   Enjoys working on Environmental manager games   No pets   Social Drivers of Corporate investment banker Strain: Not on file  Food Insecurity: Food Insecurity Present (08/23/2022)   Hunger Vital Sign    Worried About Running Out of Food in the Last Year: Often true    Ran Out of Food in the Last Year: Often true  Transportation Needs: Unmet Transportation Needs (08/23/2022)   PRAPARE - Administrator, Civil Service (Medical): Yes    Lack of Transportation (Non-Medical): Yes  Physical Activity: Not on file  Stress: Not on file  Social Connections: Not on file  Intimate Partner Violence: Not At Risk (08/23/2022)   Humiliation, Afraid, Rape, and Kick questionnaire    Fear of Current or Ex-Partner: No    Emotionally Abused: No    Physically Abused: No     Sexually Abused: No    Outpatient Medications Prior to Visit  Medication Sig Dispense Refill   bictegravir-emtricitabine -tenofovir  AF (BIKTARVY ) 50-200-25 MG TABS tablet Take 1 tablet by mouth daily. 30 tablet 11   Blood Glucose Monitoring Suppl (ACCU-CHEK GUIDE) w/Device KIT Use to test blood  sugars 3 times a day (in the morning, at noon and bedtime) 1 kit 0   Continuous Glucose Sensor (FREESTYLE LIBRE 3 PLUS SENSOR) MISC Change sensor every 15 days. 2 each 2   insulin  glargine-yfgn (SEMGLEE ) 100 UNIT/ML Pen Inject 15 Units into the skin daily. 3 mL 0   Insulin  Pen Needle 31G X 5 MM MISC Use to inject insulin  daily 100 each 0   rosuvastatin  (CRESTOR ) 20 MG tablet Take 1 tablet (20 mg total) by mouth daily. 90 tablet 1   No facility-administered medications prior to visit.    Allergies  Allergen Reactions   Menthol     Blacks out    ROS See HPI    Objective:    Physical Exam Constitutional:      General: He is not in acute distress.    Appearance: He is well-developed.  HENT:     Head: Normocephalic and atraumatic.  Cardiovascular:     Rate and Rhythm: Normal rate and regular rhythm.     Heart sounds: No murmur heard. Pulmonary:     Effort: Pulmonary effort is normal. No respiratory distress.     Breath sounds: Normal breath sounds. No wheezing or rales.  Skin:    General: Skin is warm and dry.  Neurological:     Mental Status: He is alert and oriented to person, place, and time.  Psychiatric:        Behavior: Behavior normal.        Thought Content: Thought content normal.      BP 112/79 (BP Location: Right Arm, Patient Position: Sitting, Cuff Size: Large)   Pulse 66   Temp 98.4 F (36.9 C) (Oral)   Resp 16   Ht 5' 8 (1.727 m)   Wt 237 lb (107.5 kg)   SpO2 97%   BMI 36.04 kg/m  Wt Readings from Last 3 Encounters:  09/11/23 237 lb (107.5 kg)  07/17/23 242 lb (109.8 kg)  06/12/23 238 lb (108 kg)       Assessment & Plan:   Problem List Items  Addressed This Visit       Unprioritized   RESOLVED: Rectal bleeding   RESOLVED: Pulmonary nodule   Positive QuantiFERON-TB Gold test   He was referred to the Health Department back in April- need to confirm with pt that he was contacted by them.  See mychart message.      Hyperlipidemia   Lab Results  Component Value Date   CHOL 169 03/12/2023   HDL 50.20 03/12/2023   LDLCALC 83 03/12/2023   LDLDIRECT 169.0 10/20/2022   TRIG 180.0 (H) 03/12/2023   CHOLHDL 3 03/12/2023   Stable LDL on Crestor - continue same.       Human immunodeficiency virus (HIV) disease (HCC)   This is being treated with Biktarvy  per ID.       History of syphilis   Titer decreased from 1:8 to 1:4 following retreatment.    Management/surveillance per ID.       Diabetes mellitus type 2, controlled (HCC) - Primary   Lab Results  Component Value Date   HGBA1C 6.7 (H) 06/12/2023   HGBA1C 6.3 03/12/2023   HGBA1C 5.2 01/22/2023   Lab Results  Component Value Date   LDLCALC 83 03/12/2023   CREATININE 0.86 07/03/2023   He is having some issues with his insurance coverage and insulin  but currently has his insulin . He will bring me form from his insurance to review. Continue Semglee .  Update A1C.  Relevant Orders   HgB A1c   Basic Metabolic Panel (BMET)   Urine Microalbumin w/creat. ratio   Ambulatory referral to Ophthalmology    I am having Matthew Fitzpatrick maintain his Accu-Chek Guide, Insulin  Pen Needle, rosuvastatin , FreeStyle Libre 3 Plus Sensor, Biktarvy , and insulin  glargine-yfgn.  No orders of the defined types were placed in this encounter.

## 2023-09-11 NOTE — Patient Instructions (Signed)
 VISIT SUMMARY:  You came in for a follow-up on your diabetes management and discussed issues with your medication coverage, blood sugar monitoring, and foot pain. We also reviewed your general health maintenance needs, including overdue vaccinations and an eye exam.  YOUR PLAN:  DIABETES MELLITUS: Your insulin  therapy was interrupted due to insurance issues, which may have affected your blood sugar control. -We will update your A1c today to check your blood sugar control. -We will check your kidney function and urine. -Please review the insurance letter regarding your insulin  coverage. -We will assist with any insurance issues if needed.  FOOT PAIN: You have significant foot pain that worsens with standing, which led you to stop working. You declined a referral to a foot specialist and prefer to rest your feet. -Continue to rest your feet as much as possible.  GENERAL HEALTH MAINTENANCE: You are overdue for an eye exam and some vaccinations. -We will arrange an eye exam for you at [facility] in [place]. -We discussed and offered tetanus, hepatitis B, HPV, and pneumonia vaccines, but you declined them. -Please obtain a tetanus vaccine from the pharmacy.

## 2023-09-11 NOTE — Assessment & Plan Note (Signed)
 He was referred to the Health Department back in April- need to confirm with pt that he was contacted by them.  See mychart message.

## 2023-09-11 NOTE — Assessment & Plan Note (Signed)
 Lab Results  Component Value Date   CHOL 169 03/12/2023   HDL 50.20 03/12/2023   LDLCALC 83 03/12/2023   LDLDIRECT 169.0 10/20/2022   TRIG 180.0 (H) 03/12/2023   CHOLHDL 3 03/12/2023   Stable LDL on Crestor - continue same.

## 2023-09-11 NOTE — Assessment & Plan Note (Signed)
 Titer decreased from 1:8 to 1:4 following retreatment.    Management/surveillance per ID.

## 2023-09-13 ENCOUNTER — Ambulatory Visit: Payer: Self-pay | Admitting: Family

## 2023-09-13 DIAGNOSIS — E119 Type 2 diabetes mellitus without complications: Secondary | ICD-10-CM

## 2023-09-13 MED ORDER — DAPAGLIFLOZIN PROPANEDIOL 5 MG PO TABS
5.0000 mg | ORAL_TABLET | Freq: Every day | ORAL | 3 refills | Status: DC
Start: 2023-09-13 — End: 2023-12-12

## 2023-09-15 NOTE — Telephone Encounter (Signed)
 Kim, you had helped me with this a while back.  I am not sure he followed though with the Health Department.  Would you mind checking with them and re-initiating if necessary please? Thanks!

## 2023-09-18 NOTE — Telephone Encounter (Signed)
 Called the health department, lvm for tb clinic coordinator to call us  back and let us  know if patient was follow up on this.  Form faxed to [facility] 06/09/23, refaxed today.

## 2023-09-19 NOTE — Telephone Encounter (Signed)
 Can you please contact pt and give him information to contact the health department?  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

## 2023-09-19 NOTE — Telephone Encounter (Signed)
 Copied from CRM #[id number]. Topic: General - Other >> Sep 19, 2023  8:50 AM Jasmin G wrote: Reason for CRM: A person from [facility] Dept wants to let Mrs.Levorn know that pt did not reach back to them to schedule an appt. Please call back if needed.

## 2023-09-19 NOTE — Telephone Encounter (Signed)
 Pt called and lvm , also sent letter in mail with contact info for health department

## 2023-10-19 ENCOUNTER — Telehealth: Payer: Self-pay

## 2023-10-19 NOTE — Telephone Encounter (Signed)
 Patient was identified as falling into the [place] Measure - Diabetes.   Patient was: Appointment already scheduled for:  F/U with PCP on 12/12/2023.

## 2023-10-23 ENCOUNTER — Other Ambulatory Visit: Payer: Self-pay | Admitting: Infectious Disease

## 2023-10-23 DIAGNOSIS — B20 Human immunodeficiency virus [HIV] disease: Secondary | ICD-10-CM

## 2023-12-03 ENCOUNTER — Other Ambulatory Visit: Payer: Self-pay | Admitting: Family

## 2023-12-03 DIAGNOSIS — E119 Type 2 diabetes mellitus without complications: Secondary | ICD-10-CM

## 2023-12-12 ENCOUNTER — Telehealth: Payer: Self-pay | Admitting: Family

## 2023-12-12 ENCOUNTER — Ambulatory Visit: Admitting: Family

## 2023-12-12 VITALS — BP 125/86 | HR 73 | Temp 97.7°F | Resp 16 | Ht 68.0 in | Wt 221.0 lb

## 2023-12-12 DIAGNOSIS — H9193 Unspecified hearing loss, bilateral: Secondary | ICD-10-CM

## 2023-12-12 DIAGNOSIS — R0789 Other chest pain: Secondary | ICD-10-CM

## 2023-12-12 DIAGNOSIS — Z794 Long term (current) use of insulin: Secondary | ICD-10-CM

## 2023-12-12 DIAGNOSIS — E119 Type 2 diabetes mellitus without complications: Secondary | ICD-10-CM

## 2023-12-12 DIAGNOSIS — G4700 Insomnia, unspecified: Secondary | ICD-10-CM | POA: Diagnosis not present

## 2023-12-12 DIAGNOSIS — Z227 Latent tuberculosis: Secondary | ICD-10-CM | POA: Diagnosis not present

## 2023-12-12 DIAGNOSIS — B20 Human immunodeficiency virus [HIV] disease: Secondary | ICD-10-CM

## 2023-12-12 DIAGNOSIS — E785 Hyperlipidemia, unspecified: Secondary | ICD-10-CM | POA: Diagnosis not present

## 2023-12-12 DIAGNOSIS — E1165 Type 2 diabetes mellitus with hyperglycemia: Secondary | ICD-10-CM | POA: Diagnosis not present

## 2023-12-12 LAB — BASIC METABOLIC PANEL WITH GFR
BUN: 8 mg/dL (ref 6–23)
CO2: 30 meq/L (ref 19–32)
Calcium: 9.2 mg/dL (ref 8.4–10.5)
Chloride: 97 meq/L (ref 96–112)
Creatinine, Ser: 0.98 mg/dL (ref 0.40–1.50)
GFR: 94.03 mL/min (ref >60.00)
Glucose, Bld: 330 mg/dL — ABNORMAL HIGH (ref 70–99)
Potassium: 4 meq/L (ref 3.5–5.1)
Sodium: 133 meq/L — ABNORMAL LOW (ref 135–145)

## 2023-12-12 LAB — HEMOGLOBIN A1C: Hgb A1c MFr Bld: 13.2 % — ABNORMAL HIGH (ref 4.6–6.5)

## 2023-12-12 MED ORDER — LANCET DEVICE MISC: 1.0000 | Freq: Three times a day (TID) | 0 refills | Status: AC

## 2023-12-12 MED ORDER — BLOOD GLUCOSE TEST VI STRP: 1.0000 | ORAL_STRIP | Freq: Three times a day (TID) | 0 refills | Status: AC

## 2023-12-12 MED ORDER — BLOOD GLUCOSE MONITORING SUPPL DEVI: 1.0000 | Freq: Three times a day (TID) | 0 refills | Status: AC

## 2023-12-12 MED ORDER — LANCETS MISC. MISC: 1.0000 | Freq: Three times a day (TID) | 0 refills | Status: AC

## 2023-12-12 MED ORDER — TRAZODONE HCL 50 MG PO TABS: 25.0000 mg | ORAL_TABLET | Freq: Every evening | ORAL | 3 refills | Status: DC | PRN

## 2023-12-12 MED ORDER — INSULIN GLARGINE-YFGN 100 UNIT/ML ~~LOC~~ SOPN: 20.0000 [IU] | PEN_INJECTOR | Freq: Every day | SUBCUTANEOUS | 1 refills | Status: DC

## 2023-12-12 MED ORDER — ROSUVASTATIN CALCIUM 20 MG PO TABS: 20.0000 mg | ORAL_TABLET | Freq: Every day | ORAL | 1 refills | Status: DC

## 2023-12-12 MED ORDER — DAPAGLIFLOZIN PROPANEDIOL 5 MG PO TABS: 5.0000 mg | ORAL_TABLET | Freq: Every day | ORAL | 1 refills | Status: AC

## 2023-12-12 NOTE — Assessment & Plan Note (Signed)
 Lab Results  Component Value Date   CD4TABS 1,083 01/16/2023   CD4TABS 1,293 10/02/2022   CD4TABS 1,420 08/02/2022   Patient was undetectable last visit with ID and continues Biktarvy .

## 2023-12-12 NOTE — Assessment & Plan Note (Signed)
 I wrote him a letter today for reasonable accomodation of Sign Language interpreter at work.

## 2023-12-12 NOTE — Addendum Note (Signed)
 Addended by: DARYL SETTER on: 12/12/2023 03:57 PM   Modules accepted: Level of Service

## 2023-12-12 NOTE — Assessment & Plan Note (Signed)
 Lab Results  Component Value Date   CHOL 169 03/12/2023   HDL 50.20 03/12/2023   LDLCALC 83 03/12/2023   LDLDIRECT 169.0 10/20/2022   TRIG 180.0 (H) 03/12/2023   CHOLHDL 3 03/12/2023   LDL nearly at goal, continue crestor , recheck next visit.

## 2023-12-12 NOTE — Telephone Encounter (Signed)
-----   Message from [place] sent at 12/12/2023  3:27 PM EDT ----- Certainly if he truly has never been treated. If he is a [place] resident and WAS treated while in [place] by a health dept then [facility] would have records, we can put him on schedule with someone ----- Message ----- From: Daryl Setter, NP Sent: 12/12/2023   2:16 PM EDT To: Jomarie LOISE Fleeta Kathie, MD  Hello,  I just wanted to reach out to you about his hx of + TB gold that I noted in his outside records.  I have tried referring him to the Health Department but with his hearing impairment he has had trouble making the connection with them. Do you think we should treat him for latent TB given his HIV diagnosis?  Thanks,  Tametra Ahart

## 2023-12-12 NOTE — Assessment & Plan Note (Addendum)
 Lab Results  Component Value Date   HGBA1C 9.7 (H) 09/11/2023   HGBA1C 6.7 (H) 06/12/2023   HGBA1C 6.3 03/12/2023   Lab Results  Component Value Date   MICROALBUR 0.8 09/11/2023   LDLCALC 83 03/12/2023   CREATININE 0.89 09/11/2023   Expect very high A1C since he has not been taking his insulin  or farxiga  recently. I have sent refills to his pharmacy. Needs a new glucometer and supplies so he can check his sugar.  Suspect polyuria secondary to hyperglycemia. If it persists after we get his sugar under control, would consider trial of flomax for BPH.

## 2023-12-12 NOTE — Patient Instructions (Addendum)
 VISIT SUMMARY:  Today, you were seen for chest pain and stress-related issues. We discussed your diabetes and hypertension management, and you mentioned difficulties with your insulin  usage and prescription refills. We also addressed your urinary symptoms, sleep disturbances, and a positive tuberculosis test.  YOUR PLAN:  TYPE 2 DIABETES MELLITUS: Your diabetes management has been inconsistent, which is affecting your urinary symptoms. -We will send refills for your diabetes medications to [facility]. -A glucose monitoring machine will be sent to [facility] for you. -Please use your insulin  pen correctly and ensure daily use. -It is important to regularly monitor your blood sugar levels.  URINARY FREQUENCY: Your urinary symptoms are likely due to elevated blood sugar levels from uncontrolled diabetes. -Improving your diabetes management should help with your urinary symptoms. -A glucose monitoring machine will be sent to [facility] for you.  CHEST PAIN: You have intermittent chest pain that may be related to heart issues, especially since diabetes increases your risk of heart disease. -An EKG performed today looks stable. -You will be referred to cardiology for further evaluation of your chest pain.  HYPERLIPIDEMIA: You need to resume your medication for high cholesterol. -A prescription for Crestor  will be sent to [facility] for you.  INSOMNIA: You have difficulty sleeping and the previous medication caused excessive sedation. -An alternative sleep medication (Trazodone ) will be prescribed for you.  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  I have reached out to your Infectious Disease Specialist to see if you need further treatment for this.

## 2023-12-12 NOTE — Assessment & Plan Note (Signed)
 EKG tracing is personally reviewed.  EKG notes NSR.  No acute changes.    He did have a calcium  score of zero on cardiac CT in 2019.  I suspect his symptoms are stress related but he does have risk factor for CAD with uncontrolled DM2. Recommend that patient see cardiology for further evaluation.

## 2023-12-12 NOTE — Assessment & Plan Note (Signed)
 Worsened by recent work stress.  Will give trial of HS Trazodone .

## 2023-12-12 NOTE — Progress Notes (Signed)
 Subjective:     Patient ID: Matthew Fitzpatrick, male    DOB: Jul 16, 1980, 43 y.o.   MRN: [id number]  Chief Complaint  Patient presents with   Diabetes    Here for follow up   Insomnia    Patient reports work related stress causing insomnia   Urinary Frequency    Patient reports he is going to urinate every 30 minutes    HPI  Discussed the use of AI scribe software for clinical note transcription with the patient, who gave verbal consent to proceed.  History of Present Illness  Sign language interpretor is present for today's visit.   [name] is a 43 year old male with diabetes and hypertension who presents today for follow up. He is concerned about work related stress, insomnia and  with chest pain.  He experiences chest pain since Spring, worsened by physical activity, and unrelieved by Rolaids. He associates the pain with stress. He has diabetes, takes insulin  but is unsure of the dosage, and faces issues with prescription refills and insulin  pen maintenance. He complains of polyuria.   States that he is not currently taking any of his medication including insulin  due to issues getting the refill from his pharmacy.  He has a positive tuberculosis test in the past and is in contact with the health department but has not started treatment. He experiences sleep disturbances and discontinued Z-Quil after adverse effects. He lives alone, works a stressful job, and has an eye appointment in December. He declined a flu shot.  He works as a Public affairs consultant at [employer] and has been frustrated with the work environment and the shifts he has been getting. He is wishing to have a translator at work to help him discuss his concerns with management.   Lab Results  Component Value Date   PSA 1.87 08/16/2022   PSA 3.04 08/13/2017          Health Maintenance Due  Topic Date Due   Medicare Annual Wellness (AWV)  Never done   DTaP/Tdap/Td (5 - Tdap)  10/26/1990   OPHTHALMOLOGY EXAM  03/29/2023   COVID-19 Vaccine (6 - 2025-26 season) 10/29/2023    Past Medical History:  Diagnosis Date   Acute conjunctivitis 02/12/2014   Acute gastroenteritis 08/12/2015   ANXIETY 03/02/2006   Qualifier: Diagnosis of  By: Elaine MD, [name]     Bronchitis 01/26/2011   CAP (community acquired pneumonia)    CHICKENPOX, HX OF 06/20/2006   Annotation: age 42 Qualifier: Diagnosis of  By: Elaine MD, [name]     Chlamydia infection 03/29/2018   Constipated 01/16/2023   COUGH, CHRONIC 12/23/2008   Qualifier: Diagnosis of  By: Elaine MD, [name]     Deaf    DEAFNESS, 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   Qualifier: Diagnosis of  By: Elaine MD, [name]     DEPRESSION 03/02/2006   Qualifier: Diagnosis of  By: Elaine MD, [name]     diabetes type 2 08/18/2020   Dyslipidemia 07/02/2012   ERECTILE DYSFUNCTION, 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   Qualifier: Diagnosis of  By: Elaine MD, [name]     FACIAL RASH 11/17/2008   Qualifier: Diagnosis of  By: Elaine MD, [name]     Fatty liver    GERD 07/20/2009   Qualifier: Diagnosis of  By: Elaine MD, [name]     GERD (gastroesophageal reflux disease)    HIV disease (HCC)    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 No follow-up needed if patient is low-risk.      SINUSITIS, CHRONIC 02/18/2009   Qualifier: Diagnosis of  By: Fleeta Rothman MD, [name]     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   Urinary frequency 04/06/2009   Qualifier: Diagnosis of  By: Elaine MD, [name]      Past Surgical History:  Procedure Laterality Date    COLONOSCOPY     ESOPHAGOGASTRODUODENOSCOPY     FLEXIBLE SIGMOIDOSCOPY     THROAT SURGERY     UMBILICAL HERNIA REPAIR      Family History  Problem Relation Age of Onset   Headache Mother    Hypertension Mother    Heart attack Father 10   Sudden Cardiac Death Neg Hx    Colon cancer Neg Hx    Liver disease Neg Hx    Esophageal cancer Neg Hx    Rectal cancer Neg Hx    Stomach cancer Neg Hx     Social History   Socioeconomic History   Marital status: Single    Spouse name: Not on file   Number of children: 0   Years of education: Not on file   Highest education level: Not on file  Occupational History   Not on file  Tobacco Use   Smoking status: Never    Passive exposure: Never   Smokeless tobacco: Never  Vaping Use   Vaping status: Never Used  Substance and Sexual Activity   Alcohol use: No    Alcohol/week: 0.0 standard drinks of alcohol   Drug use: No   Sexual activity: Not Currently    Partners: Male    Comment: declined condoms  Other Topics Concern   Not on file  Social History Narrative   Lives alone   Mom lives with his sister in [place]   No children   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   Enjoys working on Environmental manager games   No pets   Social Drivers of Corporate investment banker Strain: Not on file  Food Insecurity: Food Insecurity Present (08/23/2022)   Hunger Vital Sign    Worried About Running Out of Food in the Last Year: Often true    Ran Out of Food in the Last Year: Often true  Transportation Needs: Unmet Transportation Needs (08/23/2022)   PRAPARE - Administrator, Civil Service (Medical): Yes    Lack of Transportation (Non-Medical): Yes  Physical Activity: Not on file  Stress: Not on file  Social Connections: Not on file  Intimate Partner Violence: Not At Risk (08/23/2022)   Humiliation, Afraid, Rape, and Kick questionnaire    Fear of Current or Ex-Partner: No    Emotionally Abused: No    Physically  Abused: No    Sexually Abused: No    Outpatient Medications Prior to Visit  Medication Sig Dispense Refill   bictegravir-emtricitabine -tenofovir  AF (BIKTARVY ) 50-200-25 MG TABS tablet Take 1 tablet by mouth daily. 30 tablet 11   Insulin  Pen Needle 31G X 5 MM MISC Use to inject insulin  daily 100 each 0   Blood  Glucose Monitoring Suppl (ACCU-CHEK GUIDE) w/Device KIT Use to test blood sugars 3 times a day (in the morning, at noon and bedtime) 1 kit 0   Continuous Glucose Sensor (FREESTYLE LIBRE 3 PLUS SENSOR) MISC Change sensor every 15 days. 2 each 2   dapagliflozin  propanediol (FARXIGA ) 5 MG TABS tablet Take 1 tablet (5 mg total) by mouth daily before breakfast. 30 tablet 3   insulin  glargine-yfgn (SEMGLEE ) 100 UNIT/ML Pen Inject 15 Units into the skin daily. 15 mL 0   rosuvastatin  (CRESTOR ) 20 MG tablet Take 1 tablet (20 mg total) by mouth daily. (Patient not taking: Reported on 12/12/2023) 90 tablet 1   No facility-administered medications prior to visit.    Allergies  Allergen Reactions   Menthol     Blacks out    ROS    See HPI Objective:    Physical Exam Constitutional:      General: He is not in acute distress.    Appearance: He is well-developed.  HENT:     Head: Normocephalic and atraumatic.  Cardiovascular:     Rate and Rhythm: Normal rate and regular rhythm.     Heart sounds: No murmur heard. Pulmonary:     Effort: Pulmonary effort is normal. No respiratory distress.     Breath sounds: Normal breath sounds. No wheezing or rales.  Skin:    General: Skin is warm and dry.  Neurological:     Mental Status: He is alert and oriented to person, place, and time.  Psychiatric:        Behavior: Behavior normal.        Thought Content: Thought content normal.     Comments: Patient seemed agitated when discussing work stress      BP 125/86 (BP Location: Right Arm, Patient Position: Sitting, Cuff Size: Large)   Pulse 73   Temp 97.7 F (36.5 C) (Oral)   Resp 16    Ht 5' 8 (1.727 m)   Wt 221 lb (100.2 kg)   SpO2 95%   BMI 33.60 kg/m  Wt Readings from Last 3 Encounters:  12/12/23 221 lb (100.2 kg)  09/11/23 237 lb (107.5 kg)  07/17/23 242 lb (109.8 kg)       Assessment & Plan:   Problem List Items Addressed This Visit       Unprioritized   Uncontrolled type 2 diabetes mellitus with hyperglycemia (HCC)   Lab Results  Component Value Date   HGBA1C 9.7 (H) 09/11/2023   HGBA1C 6.7 (H) 06/12/2023   HGBA1C 6.3 03/12/2023   Lab Results  Component Value Date   MICROALBUR 0.8 09/11/2023   LDLCALC 83 03/12/2023   CREATININE 0.89 09/11/2023   Expect very high A1C since he has not been taking his insulin  or farxiga  recently. I have sent refills to his pharmacy. Needs a new glucometer and supplies so he can check his sugar.  Suspect polyuria secondary to hyperglycemia. If it persists after we get his sugar under control, would consider trial of flomax for BPH.       Relevant Medications   rosuvastatin  (CRESTOR ) 20 MG tablet   dapagliflozin  propanediol (FARXIGA ) 5 MG TABS tablet   insulin  glargine-yfgn (SEMGLEE ) 100 UNIT/ML Pen   Blood Glucose Monitoring Suppl DEVI   Glucose Blood (BLOOD GLUCOSE TEST STRIPS) STRP   Lancet Device MISC   Lancets Misc. MISC   Latent tuberculosis by blood test   He has not been able to make contact with the Health Department.  I will discuss  possible treatment with his ID team.       Insomnia   Worsened by recent work stress.  Will give trial of HS Trazodone .       Relevant Medications   traZODone  (DESYREL ) 50 MG tablet   Hyperlipidemia - Primary   Lab Results  Component Value Date   CHOL 169 03/12/2023   HDL 50.20 03/12/2023   LDLCALC 83 03/12/2023   LDLDIRECT 169.0 10/20/2022   TRIG 180.0 (H) 03/12/2023   CHOLHDL 3 03/12/2023   LDL nearly at goal, continue crestor , recheck next visit.      Relevant Medications   rosuvastatin  (CRESTOR ) 20 MG tablet   Human immunodeficiency virus (HIV) disease  [facility])   Lab Results  Component Value Date   CD4TABS 1,083 01/16/2023   CD4TABS 1,293 10/02/2022   CD4TABS 1,420 08/02/2022   Patient was undetectable last visit with ID and continues Biktarvy .       Hearing loss   I wrote him a letter today for reasonable accomodation of Sign Language interpreter at work.       Atypical chest pain   EKG tracing is personally reviewed.  EKG notes NSR.  No acute changes.    He did have a calcium  score of zero on cardiac CT in 2019.  I suspect his symptoms are stress related but he does have risk factor for CAD with uncontrolled DM2. Recommend that patient see cardiology for further evaluation.       Relevant Orders   EKG 12-Lead (Completed)   Ambulatory referral to Cardiology   Other Visit Diagnoses       Controlled type 2 diabetes mellitus without complication, with long-term current use of insulin  (HCC)       Relevant Medications   rosuvastatin  (CRESTOR ) 20 MG tablet   dapagliflozin  propanediol (FARXIGA ) 5 MG TABS tablet   insulin  glargine-yfgn (SEMGLEE ) 100 UNIT/ML Pen   Blood Glucose Monitoring Suppl DEVI   Glucose Blood (BLOOD GLUCOSE TEST STRIPS) STRP   Lancet Device MISC   Lancets Misc. MISC     I personally spent a total of 47 minutes in the care of the patient today including preparing to see the patient, performing a medically appropriate exam/evaluation, counseling and educating, placing orders, referring and communicating with other health care professionals, independently interpreting results, and coordinating care.   I have discontinued [employer] [name]'s [employer] and [employer]. I have also changed his insulin  glargine-yfgn. Additionally, I am having him start on Blood Glucose Monitoring Suppl, BLOOD GLUCOSE TEST STRIPS, Lancet Device, Lancets Misc., and traZODone . Lastly, I am having him maintain his Insulin  Pen Needle, Biktarvy , rosuvastatin , and dapagliflozin  propanediol.  Meds ordered  this encounter  Medications   rosuvastatin  (CRESTOR ) 20 MG tablet    Sig: Take 1 tablet (20 mg total) by mouth daily.    Dispense:  90 tablet    Refill:  1    Supervising Provider:   DOMENICA BLACKBIRD A [[id number]]   dapagliflozin  propanediol (FARXIGA ) 5 MG TABS tablet    Sig: Take 1 tablet (5 mg total) by mouth daily before breakfast.    Dispense:  90 tablet    Refill:  1    Supervising Provider:   DOMENICA BLACKBIRD A [[id number]]   insulin  glargine-yfgn (SEMGLEE ) 100 UNIT/ML Pen    Sig: Inject 20 Units into the skin daily.    Dispense:  15 mL    Refill:  1    Needs ASAP, please call us  if there  are any issues getting this filled    Supervising Provider:   DOMENICA BLACKBIRD A [[id number]]   Blood Glucose Monitoring Suppl DEVI    Sig: 1 each by Does not apply route in the morning, at noon, and at bedtime. May substitute to any manufacturer covered by patient's insurance.    Dispense:  1 each    Refill:  0    Supervising Provider:   DOMENICA BLACKBIRD A [[id number]]   Glucose Blood (BLOOD GLUCOSE TEST STRIPS) STRP    Sig: 1 each by Does not apply route in the morning, at noon, and at bedtime. May substitute to any manufacturer covered by patient's insurance.    Dispense:  100 strip    Refill:  0    Supervising Provider:   DOMENICA BLACKBIRD A [[id number]]   Lancet Device MISC    Sig: 1 each by Does not apply route in the morning, at noon, and at bedtime. May substitute to any manufacturer covered by patient's insurance.    Dispense:  1 each    Refill:  0    Supervising Provider:   DOMENICA BLACKBIRD A [[id number]]   Lancets Misc. MISC    Sig: 1 each by Does not apply route in the morning, at noon, and at bedtime. May substitute to any manufacturer covered by patient's insurance.    Dispense:  100 each    Refill:  0    Supervising Provider:   DOMENICA BLACKBIRD A [[id number]]   traZODone  (DESYREL ) 50 MG tablet    Sig: Take 0.5-1 tablets (25-50 mg total) by mouth at bedtime as needed for sleep.    Dispense:  30 tablet    Refill:  3    Supervising  Provider:   DOMENICA BLACKBIRD A [[id number]]

## 2023-12-12 NOTE — Assessment & Plan Note (Signed)
 He has not been able to make contact with the Health Department.  I will discuss possible treatment with his ID team.

## 2023-12-13 ENCOUNTER — Telehealth: Payer: Self-pay

## 2023-12-13 ENCOUNTER — Ambulatory Visit: Payer: Self-pay | Admitting: Family

## 2023-12-13 NOTE — Telephone Encounter (Signed)
-----   Message from [place] sent at 12/13/2023 12:20 PM EDT ----- Regarding: RE: Perfect thx Cece! ----- Message ----- From: Chandra Lorenda HERO, RMA Sent: 12/13/2023  11:38 AM EDT To: Jomarie LOISE Fleeta Kathie, MD; [facility]# Subject: RE:                                            Called [facility] regarding treatment- pt not treated by them.  Will forward to scheduling for appt ----- Message ----- From: Fleeta Kathie, Jomarie LOISE, MD Sent: 12/12/2023  10:36 PM EDT To: Burnard JONELLE Ellen, PA-C; [facility] Triage Nurse Po# Subject: RE:                                            Sorry this is 2nd time this week this happened ----- Message ----- From: Ellen Burnard JONELLE DEVONNA Sent: 12/12/2023   4:34 PM EDT To: Jomarie LOISE Fleeta Kathie, MD  Did you mean to send this to research or [facility] pool? ----- Message ----- From: Fleeta Kathie, Jomarie LOISE, MD Sent: 12/12/2023   3:28 PM EDT To: Eleanor Ponto, NP; [facility] Clinical Resear#  Certainly if he truly has never been treated. If he is a [place] resident and WAS treated while in  by a health dept then [facility] would have records, we can put him on schedule with someone ----- Message ----- From: Ponto Eleanor, NP Sent: 12/12/2023   2:16 PM EDT To: Jomarie LOISE Fleeta Kathie, MD  Hello,  I just wanted to reach out to you about his hx of + TB gold that I noted in his outside records.  I have tried referring him to the Health Department but with his hearing impairment he has had trouble making the connection with them. Do you think we should treat him for latent TB given his HIV diagnosis?  Thanks,  Melissa

## 2023-12-13 NOTE — Telephone Encounter (Signed)
 Left voice mail to call back to schedule an appointment with one of our providers for TB.

## 2023-12-28 NOTE — Telephone Encounter (Signed)
 Patient was identified as falling into the [place] Measure - Diabetes.   Patient was: Attribution and/or data issue.  Validation/Investigation needed.  Explanation:  Recent A1C 13.2 completed on 12/12/23 with PCP.

## 2024-01-07 ENCOUNTER — Ambulatory Visit: Admitting: Infectious Disease

## 2024-01-07 ENCOUNTER — Ambulatory Visit

## 2024-01-22 NOTE — Progress Notes (Deleted)
 Subjective:   Chief complaint: follow-up for HIV disease on medications   Patient ID: Matthew Fitzpatrick, male    DOB: [birth year], 43 y.o.   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   Qualifier: Diagnosis of  By: Elaine MD, [name]     DEPRESSION 03/02/2006   Qualifier: Diagnosis of  By: Elaine MD, [name]     diabetes type 2 08/18/2020   Dyslipidemia 07/02/2012   ERECTILE DYSFUNCTION, 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   Qualifier: Diagnosis of  By: Elaine MD, [name]     FACIAL RASH 11/17/2008   Qualifier: Diagnosis of  By: Elaine MD, [name]     Fatty liver    GERD 07/20/2009   Qualifier: Diagnosis of  By: Elaine MD, [name]     GERD (gastroesophageal reflux disease)    HIV disease (HCC)    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 No follow-up needed if patient is low-risk.      SINUSITIS, CHRONIC 02/18/2009   Qualifier: Diagnosis of  By: Fleeta Rothman MD, Dory Demont     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   Urinary frequency 04/06/2009   Qualifier: Diagnosis of  By: Elaine MD,  [name]      Past Surgical History:  Procedure Laterality Date   COLONOSCOPY     ESOPHAGOGASTRODUODENOSCOPY     FLEXIBLE SIGMOIDOSCOPY     THROAT SURGERY     UMBILICAL HERNIA REPAIR      Family History  Problem Relation Age of Onset   Headache Mother    Hypertension Mother    Heart attack Father 67   Sudden Cardiac Death Neg Hx    Colon cancer Neg Hx    Liver disease Neg Hx    Esophageal cancer Neg Hx    Rectal cancer Neg Hx    Stomach cancer Neg Hx       Social History   Socioeconomic History   Marital status: Single    Spouse name: Not on file   Number of children: 0   Years of education: Not on file   Highest education level: Not on file  Occupational History   Not on file  Tobacco Use   Smoking status: Never    Passive exposure: Never   Smokeless tobacco: Never  Vaping Use   Vaping status: Never Used  Substance and Sexual Activity   Alcohol use: No    Alcohol/week: 0.0 standard drinks of alcohol   Drug use: No   Sexual activity:  Not Currently    Partners: Male    Comment: declined condoms  Other Topics Concern   Not on file  Social History Narrative   Lives alone   Mom lives with his sister in [place]   No children   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[place]   Enjoys working on [employer]   No pets   Social Drivers of Corporate Investment Banker Strain: Not on file  Food Insecurity: Food Insecurity Present (08/23/2022)   Hunger Vital Sign    Worried About Running Out of Food in the Last Year: Often true    Ran Out of Food in the Last Year: Often true  Transportation Needs: Unmet Transportation Needs (08/23/2022)   PRAPARE - Administrator, Civil Service (Medical): Yes    Lack of Transportation (Non-Medical): Yes  Physical Activity: Not on file  Stress: Not on file  Social Connections: Not on file    Allergies  Allergen Reactions   Menthol     Blacks out     Current Outpatient Medications:     bictegravir-emtricitabine -tenofovir  AF (BIKTARVY ) 50-200-25 MG TABS tablet, Take 1 tablet by mouth daily., Disp: 30 tablet, Rfl: 11   Blood Glucose Monitoring Suppl DEVI, 1 each by Does not apply route in the morning, at noon, and at bedtime. May substitute to any manufacturer covered by patient's insurance., Disp: 1 each, Rfl: 0   dapagliflozin  propanediol (FARXIGA ) 5 MG TABS tablet, Take 1 tablet (5 mg total) by mouth daily before breakfast., Disp: 90 tablet, Rfl: 1   insulin  glargine-yfgn (SEMGLEE ) 100 UNIT/ML Pen, Inject 20 Units into the skin daily., Disp: 15 mL, Rfl: 1   Insulin  Pen Needle 31G X 5 MM MISC, Use to inject insulin  daily, Disp: 100 each, Rfl: 0   rosuvastatin  (CRESTOR ) 20 MG tablet, Take 1 tablet (20 mg total) by mouth daily., Disp: 90 tablet, Rfl: 1   traZODone  (DESYREL ) 50 MG tablet, Take 0.5-1 tablets (25-50 mg total) by mouth at bedtime as needed for sleep., Disp: 30 tablet, Rfl: 3    Review of Systems     Objective:   Physical Exam        Assessment & Plan:

## 2024-01-23 ENCOUNTER — Ambulatory Visit: Admitting: Infectious Disease

## 2024-01-23 DIAGNOSIS — E785 Hyperlipidemia, unspecified: Secondary | ICD-10-CM

## 2024-01-23 DIAGNOSIS — E1165 Type 2 diabetes mellitus with hyperglycemia: Secondary | ICD-10-CM

## 2024-01-23 DIAGNOSIS — B20 Human immunodeficiency virus [HIV] disease: Secondary | ICD-10-CM

## 2024-02-08 ENCOUNTER — Telehealth: Payer: Self-pay

## 2024-02-08 NOTE — Telephone Encounter (Addendum)
 Received call from [facility] stating they had this RN's name listed to assist with interpreting and that they need to notify patient they are unable to get the glargine insulin  that was prescribed to him. Advised that patient has phone with video interpreter capabilities for ASL and that pharmacy would need to notify prescriber's office since ID does not manage his insulin . Recommended she contact prescriber and patient directly.   Pamla Pangle, BSN, RN

## 2024-02-08 NOTE — Telephone Encounter (Signed)
 Patient was identified as falling into the [place] Measure - Diabetes.   Patient was: Requires a call back at a later time. Patient had A1C drawn 11/2023.

## 2024-02-13 ENCOUNTER — Other Ambulatory Visit: Payer: Self-pay

## 2024-02-13 ENCOUNTER — Telehealth: Payer: Self-pay

## 2024-02-13 DIAGNOSIS — E119 Type 2 diabetes mellitus without complications: Secondary | ICD-10-CM

## 2024-02-13 MED ORDER — INSULIN GLARGINE-YFGN 100 UNIT/ML ~~LOC~~ SOPN: 20.0000 [IU] | PEN_INJECTOR | Freq: Every day | SUBCUTANEOUS | 1 refills | Status: DC

## 2024-02-13 MED ORDER — INSULIN GLARGINE 100 UNIT/ML SOLOSTAR PEN: 20.0000 [IU] | PEN_INJECTOR | Freq: Every day | SUBCUTANEOUS | 3 refills | Status: AC

## 2024-02-13 NOTE — Telephone Encounter (Signed)
 I sent an rx for Lantus  instead. Can you please double check with the pharmacy that they can fill this and notify pt that we made this change?

## 2024-02-13 NOTE — Telephone Encounter (Signed)
 Copied from CRM #[id number]. Topic: General - Other >> Feb 13, 2024  1:58 PM Rosina BIRCH wrote: Reason for CRM: katherine from [facility] called stating the office keep sending in the generic medication-insulin  glargine-yfgn and it is unavailable and has been for several months. Comer stated the office need to talk to the patient regarding therapy changes [phone]

## 2024-02-14 NOTE — Telephone Encounter (Signed)
 Per pharmacist rx has been filled just fine and will be delivered to patient

## 2024-02-17 ENCOUNTER — Encounter: Payer: Self-pay | Admitting: Infectious Disease

## 2024-02-17 NOTE — Progress Notes (Unsigned)
 " NOTE IN PERSON SIGN LANGUAGE INTERPRETER PRESENT FOR ENTIRETY OF INTERVIEW  Subjective:  Chief complaint: follow-up for HIV disease on medications   Patient ID: Matthew Fitzpatrick, male    DOB: Aug 01, 1980, 43 y.o.   MRN: [id number]  HPI  Discussed the use of AI scribe software for clinical note transcription with the patient, who gave verbal consent to proceed.  History of Present Illness   Matthew Fitzpatrick is a 43 year old male with hx of HIV and latent tuberculosis who presents for follow-up  In 2021, he had a positive tuberculosis test, (QF gold) during workup for ocular disease that seemed more c/w herpetic pathology   No ONE seems to have picked up the loose end of his being rx until his PCP Dr. Daryl noticed this this year.  His current medication regimen includes Biktarvy  and rosuvastatin , with occasional use of Farxiga  5 mg. He is not on insulin  or blood pressure medications. Medications are received through a mail-order service from [facility] in [place].  He has a history of HIV, which is well-controlled with a viral load of less than 20 and a CD4 count of 1824. An anal Pap smear in May 2025, but no follow-up anoscopy after this  His last A1c was 13.2 in October.   He is unsure if he has been vaccinated for flu or COVID this fall. No history of ulcerative colitis.       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   Qualifier: Diagnosis of  By: Elaine MD, [name]      DEPRESSION 03/02/2006   Qualifier: Diagnosis of  By: Elaine MD, [name]     diabetes type 2 08/18/2020   Dyslipidemia 07/02/2012   ERECTILE DYSFUNCTION, 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   Qualifier: Diagnosis of  By: Elaine MD, [name]     FACIAL RASH 11/17/2008   Qualifier: Diagnosis of  By: Elaine MD, [name]     Fatty liver    GERD 07/20/2009   Qualifier: Diagnosis of  By: Elaine MD, [name]     GERD (gastroesophageal reflux disease)    HIV disease (HCC)    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 No follow-up needed if patient is low-risk.      SINUSITIS, CHRONIC 02/18/2009   Qualifier: Diagnosis of  By: Fleeta Rothman MD, Tlaloc Taddei     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   Urinary frequency 04/06/2009   Qualifier: Diagnosis of  By: Elaine MD, Norleen  Past Surgical History:  Procedure Laterality Date   COLONOSCOPY     ESOPHAGOGASTRODUODENOSCOPY     FLEXIBLE SIGMOIDOSCOPY     THROAT SURGERY     UMBILICAL HERNIA REPAIR      Family History  Problem Relation Age of Onset   Headache Mother    Hypertension Mother    Heart attack Father 22   Sudden Cardiac Death Neg Hx    Colon cancer Neg Hx    Liver disease Neg Hx    Esophageal cancer Neg Hx    Rectal cancer Neg Hx    Stomach cancer Neg Hx       Social History   Socioeconomic History   Marital status: Single    Spouse name: Not on file   Number of children: 0   Years of education: Not on file   Highest education level: Not on file  Occupational History   Not on file  Tobacco Use    Smoking status: Never    Passive exposure: Never   Smokeless tobacco: Never  Vaping Use   Vaping status: Never Used  Substance and Sexual Activity   Alcohol use: No    Alcohol/week: 0.0 standard drinks of alcohol   Drug use: No   Sexual activity: Not Currently    Partners: Male    Comment: declined condoms  Other Topics Concern   Not on file  Social History Narrative   Lives alone   Mom lives with his sister in [place]   No children   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 [place]   Enjoys working on [employer]   No pets   Social Drivers of Health   Tobacco Use: Low Risk (07/17/2023)   Patient History    Smoking Tobacco Use: Never    Smokeless Tobacco Use: Never    Passive Exposure: Never  Financial Resource Strain: Not on file  Food Insecurity: Food Insecurity Present (08/23/2022)   Hunger Vital Sign    Worried About Programme Researcher, Broadcasting/film/video in the Last Year: Often true    Ran Out of Food in the Last Year: Often true  Transportation Needs: Unmet Transportation Needs (08/23/2022)   PRAPARE - Administrator, Civil Service (Medical): Yes    Lack of Transportation (Non-Medical): Yes  Physical Activity: Not on file  Stress: Not on file  Social Connections: Not on file  Depression (PHQ2-9): Medium Risk (12/12/2023)   Depression (PHQ2-9)    PHQ-2 Score: 10  Alcohol Screen: Not on file  Housing: Patient Declined (08/23/2022)   Housing    Last Housing Risk Score: 0  Utilities: At Risk (08/23/2022)   AHC Utilities    Threatened with loss of utilities: Yes  Health Literacy: Not on file    Allergies[1]  Current Medications[2]   Review of Systems  Constitutional:  Negative for chills and fever.  HENT:  Negative for congestion and sore throat.   Eyes:  Negative for photophobia.  Respiratory:  Negative for cough, shortness of breath and wheezing.   Cardiovascular:  Negative for chest pain, palpitations and leg swelling.  Gastrointestinal:   Negative for abdominal pain, blood in stool, constipation, diarrhea, nausea and vomiting.  Genitourinary:  Negative for dysuria, flank pain and hematuria.  Musculoskeletal:  Negative for back pain and myalgias.  Skin:  Negative for rash.  Neurological:  Negative for dizziness, weakness and headaches.  Hematological:  Does not bruise/bleed easily.  Psychiatric/Behavioral:  Negative for suicidal ideas.  Objective:   Physical Exam Constitutional:      Appearance: He is well-developed.  HENT:     Head: Normocephalic and atraumatic.  Eyes:     Conjunctiva/sclera: Conjunctivae normal.  Cardiovascular:     Rate and Rhythm: Normal rate and regular rhythm.  Pulmonary:     Effort: Pulmonary effort is normal. No respiratory distress.     Breath sounds: No wheezing.  Abdominal:     General: There is no distension.     Palpations: Abdomen is soft.  Musculoskeletal:        General: No tenderness. Normal range of motion.     Cervical back: Normal range of motion and neck supple.  Skin:    General: Skin is warm and dry.     Coloration: Skin is not pale.     Findings: No erythema or rash.  Neurological:     General: No focal deficit present.     Mental Status: He is alert and oriented to person, place, and time.  Psychiatric:        Mood and Affect: Mood normal.        Behavior: Behavior normal.        Thought Content: Thought content normal.        Judgment: Judgment normal.           Assessment & Plan:   Assessment and Plan    Latent tuberculosis Risk of reactivation necessitates treatment. - Prescribed isoniazid  for 6 to 9 months. - Prescribed vitamin B6 daily with isoniazid . - Scheduled follow-up in 1 to 2 months to monitor medication tolerance.  Human immunodeficiency virus (HIV) disease HIV well-controlled with viral load <20 and CD4 count 1824. - Ordered routine blood work including viral load and CD4 count. - Renewed Biktarvy  prescription. - Offered STI  testing including gonorrhea and chlamydia-but he declined   DM: requiring insulin  currently. Has been poorly controlled   Hyperlipidemia: on crestor    Abnormal anal pap smear: gave him phone number for [facility] to make an appt for HRA Vaccine counseling: recommended and he received Flu and COVID shots          [1]  Allergies Allergen Reactions   Menthol     Blacks out  [2]  Current Outpatient Medications:    bictegravir-emtricitabine -tenofovir  AF (BIKTARVY ) 50-200-25 MG TABS tablet, Take 1 tablet by mouth daily., Disp: 30 tablet, Rfl: 11   Blood Glucose Monitoring Suppl DEVI, 1 each by Does not apply route in the morning, at noon, and at bedtime. May substitute to any manufacturer covered by patient's insurance., Disp: 1 each, Rfl: 0   dapagliflozin  propanediol (FARXIGA ) 5 MG TABS tablet, Take 1 tablet (5 mg total) by mouth daily before breakfast., Disp: 90 tablet, Rfl: 1   insulin  glargine (LANTUS ) 100 UNIT/ML Solostar Pen, Inject 20 Units into the skin daily., Disp: 15 mL, Rfl: 3   Insulin  Pen Needle 31G X 5 MM MISC, Use to inject insulin  daily, Disp: 100 each, Rfl: 0   rosuvastatin  (CRESTOR ) 20 MG tablet, Take 1 tablet (20 mg total) by mouth daily., Disp: 90 tablet, Rfl: 1   traZODone  (DESYREL ) 50 MG tablet, Take 0.5-1 tablets (25-50 mg total) by mouth at bedtime as needed for sleep., Disp: 30 tablet, Rfl: 3  "

## 2024-02-18 ENCOUNTER — Ambulatory Visit (INDEPENDENT_AMBULATORY_CARE_PROVIDER_SITE_OTHER): Admitting: Infectious Disease

## 2024-02-18 ENCOUNTER — Other Ambulatory Visit: Payer: Self-pay

## 2024-02-18 ENCOUNTER — Encounter: Payer: Self-pay | Admitting: Infectious Disease

## 2024-02-18 ENCOUNTER — Other Ambulatory Visit (HOSPITAL_COMMUNITY)
Admission: RE | Admit: 2024-02-18 | Discharge: 2024-02-18 | Disposition: A | Discharge status: Home / Self Care | Source: Ambulatory Visit | Attending: Infectious Disease | Admitting: Infectious Disease

## 2024-02-18 ENCOUNTER — Other Ambulatory Visit (HOSPITAL_COMMUNITY): Payer: Self-pay

## 2024-02-18 VITALS — BP 149/96 | HR 75 | Temp 97.8°F | Ht 68.0 in | Wt 228.0 lb

## 2024-02-18 DIAGNOSIS — Z794 Long term (current) use of insulin: Secondary | ICD-10-CM | POA: Diagnosis not present

## 2024-02-18 DIAGNOSIS — Z227 Latent tuberculosis: Secondary | ICD-10-CM | POA: Diagnosis present

## 2024-02-18 DIAGNOSIS — E785 Hyperlipidemia, unspecified: Secondary | ICD-10-CM | POA: Insufficient documentation

## 2024-02-18 DIAGNOSIS — B20 Human immunodeficiency virus [HIV] disease: Secondary | ICD-10-CM

## 2024-02-18 DIAGNOSIS — E1165 Type 2 diabetes mellitus with hyperglycemia: Secondary | ICD-10-CM

## 2024-02-18 DIAGNOSIS — R85619 Unspecified abnormal cytological findings in specimens from anus: Secondary | ICD-10-CM

## 2024-02-18 DIAGNOSIS — K51311 Ulcerative (chronic) rectosigmoiditis with rectal bleeding: Secondary | ICD-10-CM | POA: Diagnosis present

## 2024-02-18 DIAGNOSIS — Z23 Encounter for immunization: Secondary | ICD-10-CM

## 2024-02-18 MED ORDER — VITAMIN B-6 50 MG PO TABS: 50.0000 mg | ORAL_TABLET | Freq: Every day | ORAL | 8 refills | Status: DC

## 2024-02-18 MED ORDER — BIKTARVY 50-200-25 MG PO TABS: 1.0000 | ORAL_TABLET | Freq: Every day | ORAL | 11 refills | Status: DC

## 2024-02-18 MED ORDER — ISONIAZID 300 MG PO TABS: 300.0000 mg | ORAL_TABLET | Freq: Every day | ORAL | 8 refills | Status: DC

## 2024-02-18 NOTE — Patient Instructions (Signed)
 Call Dr. Bernarda Ned  Address: [address], [place], [place] [address] Get There: 8 min Phone: [phone] Products and Services: [email or web address]  Also make sure to bring ALL of your meds to your January appt

## 2024-02-19 LAB — URINE CYTOLOGY ANCILLARY ONLY
Chlamydia: NEGATIVE
Comment: NEGATIVE
Comment: NORMAL
Neisseria Gonorrhea: NEGATIVE

## 2024-02-19 LAB — T-HELPER CELLS (CD4) COUNT (NOT AT ARMC)
CD4 % Helper T Cell: 38 % (ref 33–65)
CD4 T Cell Abs: 1131 /uL (ref 400–1790)

## 2024-02-22 ENCOUNTER — Ambulatory Visit: Payer: Self-pay

## 2024-02-22 DIAGNOSIS — A539 Syphilis, unspecified: Secondary | ICD-10-CM

## 2024-02-22 LAB — COMPLETE METABOLIC PANEL WITHOUT GFR
AG Ratio: 1.1 (calc) (ref 1.0–2.5)
ALT: 19 U/L (ref 9–46)
AST: 20 U/L (ref 10–40)
Albumin: 4.1 g/dL (ref 3.6–5.1)
Alkaline phosphatase (APISO): 116 U/L (ref 36–130)
BUN: 8 mg/dL (ref 7–25)
CO2: 29 mmol/L (ref 20–32)
Calcium: 9.1 mg/dL (ref 8.6–10.3)
Chloride: 103 mmol/L (ref 98–110)
Creat: 0.82 mg/dL (ref 0.60–1.29)
Globulin: 3.7 g/dL (ref 1.9–3.7)
Glucose, Bld: 166 mg/dL — ABNORMAL HIGH (ref 65–99)
Potassium: 3.9 mmol/L (ref 3.5–5.3)
Sodium: 140 mmol/L (ref 135–146)
Total Bilirubin: 0.6 mg/dL (ref 0.2–1.2)
Total Protein: 7.8 g/dL (ref 6.1–8.1)

## 2024-02-22 LAB — CBC WITH DIFFERENTIAL/PLATELET
Absolute Lymphocytes: 3080 {cells}/uL (ref 850–3900)
Absolute Monocytes: 409 {cells}/uL (ref 200–950)
Basophils Absolute: 67 {cells}/uL (ref 0–200)
Basophils Relative: 1.2 %
Eosinophils Absolute: 62 {cells}/uL (ref 15–500)
Eosinophils Relative: 1.1 %
HCT: 45 % (ref 39.4–51.1)
Hemoglobin: 15.1 g/dL (ref 13.2–17.1)
MCH: 30.8 pg (ref 27.0–33.0)
MCHC: 33.6 g/dL (ref 31.6–35.4)
MCV: 91.6 fL (ref 81.4–101.7)
MPV: 10.9 fL (ref 7.5–12.5)
Monocytes Relative: 7.3 %
Neutro Abs: 1982 {cells}/uL (ref 1500–7800)
Neutrophils Relative %: 35.4 %
Platelets: 255 Thousand/uL (ref 140–400)
RBC: 4.91 Million/uL (ref 4.20–5.80)
RDW: 12.4 % (ref 11.0–15.0)
Total Lymphocyte: 55 %
WBC: 5.6 Thousand/uL (ref 3.8–10.8)

## 2024-02-22 LAB — LIPID PANEL
Cholesterol: 157 mg/dL
HDL: 58 mg/dL
LDL Cholesterol (Calc): 83 mg/dL
Non-HDL Cholesterol (Calc): 99 mg/dL
Total CHOL/HDL Ratio: 2.7 (calc)
Triglycerides: 75 mg/dL

## 2024-02-22 LAB — SYPHILIS: RPR W/REFLEX TO RPR TITER AND TREPONEMAL ANTIBODIES, TRADITIONAL SCREENING AND DIAGNOSIS ALGORITHM: RPR Ser Ql: REACTIVE — AB

## 2024-02-22 LAB — RPR TITER: RPR Titer: 1:16 {titer} — ABNORMAL HIGH

## 2024-02-22 LAB — HIV-1 RNA QUANT-NO REFLEX-BLD
HIV 1 RNA Quant: 78 {copies}/mL — ABNORMAL HIGH
HIV-1 RNA Quant, Log: 1.89 {Log_copies}/mL — ABNORMAL HIGH

## 2024-02-22 LAB — T PALLIDUM AB: T Pallidum Abs: POSITIVE — AB

## 2024-02-22 MED ORDER — DOXYCYCLINE HYCLATE 100 MG PO TABS: 100.0000 mg | ORAL_TABLET | Freq: Two times a day (BID) | ORAL | 0 refills | Status: DC

## 2024-02-25 NOTE — Progress Notes (Signed)
 "      OFFICE NOTE:    Date:  03/04/2024  ID:  Matthew Fitzpatrick, DOB [birth year], MRN [id number] PCP: Daryl Setter, NP  [facility] Providers Cardiologist:  Soyla DELENA Merck, MD        Dyspnea Exercise treadmill test (ETT) (08/14/2016): Negative for ischemia Echocardiogram (07/19/2016): Left ventricular ejection fraction 65-70%, no regional wall motion abnormality, trivial tricuspid regurgitation Coronary CT angiography (CCTA) (07/15/2017): Coronary artery calcium  score zero, no coronary artery disease Diabetes mellitus Hypertension Hyperlipidemia Congenital deafness Fatty liver Gastroesophageal reflux disease (GERD) HIV Latent tuberculosis        Discussed the use of AI scribe software for clinical note transcription with the patient, who gave verbal consent to proceed. History of Present Illness Matthew Fitzpatrick is a 43 y.o. male referred by Daryl Setter, NP for further evaluation of exertional chest pain. He was seen by Dr. Mady in our practice in 2019 for shortness of breath. CCTA was normal. He saw Dr. Merck in 2021 and follow up prn was recommended.  He is seen with the help of an ASL interpreter. He has a history of chest pain, initially evaluated in October, characterized by a burning sensation that sometimes occured at rest but was not associated with exertion. The pain was described as 'light' and not related to meals.  He took an over-the-counter medication which provided relief within one to three days, and there has been no recurrence of chest pain since October.  Of note, he takes Farxiga  for diabetes, which he has been out of since last week.  I have asked him to follow-up with primary care to get this refilled.  He has a family history of heart disease, with his father having had a heart attack.   Socially, he works at [employer], is single, and has no children. He does not smoke, drink alcohol, or use  drugs.   ROS-See HPI     Studies Reviewed:  EKG Interpretation Date/Time:  Tuesday March 04 2024 10:11:25 EST Ventricular Rate:  81 PR Interval:  148 QRS Duration:  104 QT Interval:  374 QTC Calculation: 434 R Axis:   -32  Text Interpretation: Normal sinus rhythm Left axis deviation Incomplete right bundle branch block Nonspecific T wave abnormality Confirmed by Lelon Hamilton [phone]) on 03/04/2024 10:20:21 AM    LABS 12/12/23: A1c 13.2  02/18/24: K 3.9, SCr 0.82, ALT 19, TC 157, Trig 75, LDL 83, HDL 58, Hgb 15.1, PLT 255K  EKG (12/12/2023): Normal sinus rhythm, heart rate 62, QTc 375, left axis deviation, incomplete right bundle branch block, no significant change compared to prior          Physical Exam:  VS:  BP 122/70   Pulse 81   Ht 5' 8 (1.727 m)   Wt 226 lb 1.3 oz (102.5 kg)   SpO2 94%   BMI 34.38 kg/m        Wt Readings from Last 3 Encounters:  03/04/24 226 lb 1.3 oz (102.5 kg)  02/18/24 228 lb (103.4 kg)  12/12/23 221 lb (100.2 kg)    Constitutional:      Appearance: Healthy appearance. Not in distress.  Neck:     Vascular: JVD normal.  Pulmonary:     Breath sounds: Normal breath sounds. No wheezing. No rales.  Cardiovascular:     Normal rate. Regular rhythm.     Murmurs: There is no murmur.  Edema:    Peripheral edema absent.  Abdominal:  Palpations: Abdomen is soft.       Assessment and Plan:    Assessment & Plan Precordial chest pain He had an episode of chest pain in October.  He has not had a recurrence since then. Previous coronary CTA in May 2019 demonstrated a calcium  score of 0 and no CAD. No significant changes on EKG.  We discussed pursuing an ETT. However, without recurrent symptoms, I think it is reasonable to pursue no testing at this time. Through shared decision making, we agreed to hold off on testing for now. He knows to follow up if he has recurrent symptoms. If he has recurrent chest pain, I would have a low threshold to  obtain a repeat CCTA.  Orders:   EKG 12-Lead  Uncontrolled type 2 diabetes mellitus with hyperglycemia (HCC) I have asked him to make sure he refills his Farxiga  or reach out to primary care to get a refill.            Dispo:  No follow-ups on file.  Signed, Glendia Ferrier, PA-C    "

## 2024-02-27 ENCOUNTER — Other Ambulatory Visit: Payer: Self-pay | Admitting: Pharmacist

## 2024-02-27 DIAGNOSIS — B20 Human immunodeficiency virus [HIV] disease: Secondary | ICD-10-CM

## 2024-02-27 LAB — OPHTHALMOLOGY REPORT-SCANNED

## 2024-02-27 MED ORDER — BIKTARVY 50-200-25 MG PO TABS: 1.0000 | ORAL_TABLET | Freq: Every day | ORAL | Status: AC

## 2024-02-27 NOTE — Progress Notes (Signed)
 Medication Samples have been provided to the patient.  Drug name: Biktarvy         Strength: 50/200/25 mg       Qty: 2 bottles (14 tablets)   LOT: [id number]   Exp.Date: 03/29/26  Samples approved by me.  Dosing instructions: Take one tablet by mouth once daily  The patient has been instructed regarding the correct time, dose, and frequency of taking this medication, including desired effects and most common side effects.   Evan Osburn L. Tramayne Sebesta, PharmD, BCIDP, AAHIVP, CPP Clinical Pharmacist Practitioner Infectious Diseases Clinical Pharmacist [facility]

## 2024-03-03 ENCOUNTER — Encounter: Payer: Self-pay | Admitting: Infectious Disease

## 2024-03-03 DIAGNOSIS — R85610 Atypical squamous cells of undetermined significance on cytologic smear of anus (ASC-US): Secondary | ICD-10-CM | POA: Insufficient documentation

## 2024-03-03 NOTE — Progress Notes (Unsigned)
 " NOTE IN PERSON SIGN LANGUAGE INTERPRETER PRESENT FOR ENTIRETY OF INTERVIEW  Subjective:  Chief complaint: follow-up for HIV disease on medications    Patient ID: Matthew Fitzpatrick, male    DOB: [birth year], 44 y.o.   MRN: [id number]  HPI  Discussed the use of AI scribe software for clinical note transcription with the patient, who gave verbal consent to proceed.  History of Present Illness         Discussed the use of AI scribe software for clinical note transcription with the patient, who gave verbal consent to proceed.  History of Present Illness   Matthew Fitzpatrick is a 44 year old male with latent tuberculosis and HIV who presents for management of latent tuberculosis.  He has a history of latent tuberculosis, identified years ago through a blood test, and is currently asymptomatic as the tuberculosis is not active. He has not yet initiated treatment for this condition.  He is managing HIV and is currently on Biktarvy . Recent lab results indicate a viral load of 78, slightly above the target of less than 50, but his immune system remains robust with a CD4 count of 1131.  He had RPR go up to 1:16 and doxy was sent in but he could not afford due to SPAP not being active yet.       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   Qualifier: Diagnosis of  By: Elaine MD, [name]     DEPRESSION 03/02/2006   Qualifier: Diagnosis of  By: Elaine MD, [name]     diabetes type 2 08/18/2020    Dyslipidemia 07/02/2012   ERECTILE DYSFUNCTION, 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   Qualifier: Diagnosis of  By: Elaine MD, [name]     FACIAL RASH 11/17/2008   Qualifier: Diagnosis of  By: Elaine MD, [name]     Fatty liver    GERD 07/20/2009   Qualifier: Diagnosis of  By: Elaine MD, [name]     GERD (gastroesophageal reflux disease)    HIV disease (HCC)    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 No follow-up needed if patient is low-risk.      SINUSITIS, CHRONIC 02/18/2009   Qualifier: Diagnosis of  By: Fleeta Rothman MD, Trinady Milewski     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   Urinary frequency 04/06/2009   Qualifier: Diagnosis of  By: Elaine MD, [name]      Past Surgical History:  Procedure Laterality Date   COLONOSCOPY  ESOPHAGOGASTRODUODENOSCOPY     FLEXIBLE SIGMOIDOSCOPY     THROAT SURGERY     UMBILICAL HERNIA REPAIR      Family History  Problem Relation Age of Onset   Headache Mother    Hypertension Mother    Heart attack Father 49   Sudden Cardiac Death Neg Hx    Colon cancer Neg Hx    Liver disease Neg Hx    Esophageal cancer Neg Hx    Rectal cancer Neg Hx    Stomach cancer Neg Hx       Social History   Socioeconomic History   Marital status: Single    Spouse name: Not on file   Number of children: 0   Years of education: Not on file   Highest education level: Not on file  Occupational History   Not on file  Tobacco Use   Smoking status: Never    Passive exposure: Never   Smokeless tobacco: Never  Vaping Use   Vaping status:  Never Used  Substance and Sexual Activity   Alcohol use: No    Alcohol/week: 0.0 standard drinks of alcohol   Drug use: No   Sexual activity: Not Currently    Partners: Male    Comment: declined condoms  Other Topics Concern   Not on file  Social History Narrative   Lives alone   Mom lives with his sister in [place]   No children   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[place]   Enjoys working on [employer]   No pets   Social Drivers of Health   Tobacco Use: Low Risk (02/18/2024)   Patient History    Smoking Tobacco Use: Never    Smokeless Tobacco Use: Never    Passive Exposure: Never  Financial Resource Strain: Not on file  Food Insecurity: Food Insecurity Present (08/23/2022)   Hunger Vital Sign    Worried About Programme Researcher, Broadcasting/film/video in the Last Year: Often true    Ran Out of Food in the Last Year: Often true  Transportation Needs: Unmet Transportation Needs (08/23/2022)   PRAPARE - Administrator, Civil Service (Medical): Yes    Lack of Transportation (Non-Medical): Yes  Physical Activity: Not on file  Stress: Not on file  Social Connections: Not on file  Depression (PHQ2-9): Medium Risk (12/12/2023)   Depression (PHQ2-9)    PHQ-2 Score: 10  Alcohol Screen: Not on file  Housing: Patient Declined (08/23/2022)   Housing    Last Housing Risk Score: 0  Utilities: At Risk (08/23/2022)   AHC Utilities    Threatened with loss of utilities: Yes  Health Literacy: Not on file    Allergies[1]  Current Medications[2]   Review of Systems  Constitutional:  Negative for activity change, appetite change, chills, diaphoresis, fatigue, fever and unexpected weight change.  HENT:  Negative for congestion, rhinorrhea, sinus pressure, sneezing, sore throat and trouble swallowing.   Eyes:  Negative for photophobia and visual disturbance.  Respiratory:  Negative for cough, chest tightness, shortness of breath, wheezing and stridor.   Cardiovascular:   Negative for chest pain, palpitations and leg swelling.  Gastrointestinal:  Negative for abdominal distention, abdominal pain, anal bleeding, blood in stool, constipation, diarrhea, nausea and vomiting.  Genitourinary:  Negative for difficulty urinating, dysuria, flank pain and hematuria.  Musculoskeletal:  Negative for arthralgias, back pain, gait problem, joint swelling and myalgias.  Skin:  Negative for color change, pallor, rash and wound.  Neurological:  Negative  for dizziness, tremors, weakness and light-headedness.  Hematological:  Negative for adenopathy. Does not bruise/bleed easily.  Psychiatric/Behavioral:  Negative for agitation, behavioral problems, confusion, decreased concentration, dysphoric mood and sleep disturbance.        Objective:   Physical Exam Constitutional:      Appearance: He is well-developed.  HENT:     Head: Normocephalic and atraumatic.  Eyes:     Conjunctiva/sclera: Conjunctivae normal.  Cardiovascular:     Rate and Rhythm: Normal rate and regular rhythm.  Pulmonary:     Effort: Pulmonary effort is normal. No respiratory distress.     Breath sounds: No wheezing.  Abdominal:     General: There is no distension.     Palpations: Abdomen is soft.  Musculoskeletal:        General: No tenderness. Normal range of motion.     Cervical back: Normal range of motion and neck supple.  Skin:    General: Skin is warm and dry.     Coloration: Skin is not pale.     Findings: No erythema or rash.  Neurological:     General: No focal deficit present.     Mental Status: He is alert and oriented to person, place, and time.  Psychiatric:        Mood and Affect: Mood normal.        Behavior: Behavior normal.        Thought Content: Thought content normal.        Judgment: Judgment normal.           Assessment & Plan:   Assessment and Plan    Latent tuberculosis --start INH andvitamin B6. - Sent prescription to [facility] on [address].  Human  immunodeficiency virus (HIV) disease HIV well-controlled with Biktarvy . Viral load 78, slightly above target; CD4 count healthy at 1331. - Continue Biktarvy .  Syphilis Recent increase in syphilis titer. Previous doxycycline  treatment incomplete due to pharmacy issues. Penicillin  not feasible due to shortages. - Sent new prescription for doxycycline  for 14 days to [facility].           [1]  Allergies Allergen Reactions   Menthol     Blacks out  [2]  Current Outpatient Medications:    bictegravir-emtricitabine -tenofovir  AF (BIKTARVY ) 50-200-25 MG TABS tablet, Take 1 tablet by mouth daily., Disp: 30 tablet, Rfl: 11   bictegravir-emtricitabine -tenofovir  AF (BIKTARVY ) 50-200-25 MG TABS tablet, Take 1 tablet by mouth daily for 14 days., Disp: , Rfl:    Blood Glucose Monitoring Suppl DEVI, 1 each by Does not apply route in the morning, at noon, and at bedtime. May substitute to any manufacturer covered by patient's insurance., Disp: 1 each, Rfl: 0   dapagliflozin  propanediol (FARXIGA ) 5 MG TABS tablet, Take 1 tablet (5 mg total) by mouth daily before breakfast., Disp: 90 tablet, Rfl: 1   doxycycline  (VIBRA -TABS) 100 MG tablet, Take 1 tablet (100 mg total) by mouth 2 (two) times daily for 14 days., Disp: 28 tablet, Rfl: 0   insulin  glargine (LANTUS ) 100 UNIT/ML Solostar Pen, Inject 20 Units into the skin daily., Disp: 15 mL, Rfl: 3   Insulin  Pen Needle 31G X 5 MM MISC, Use to inject insulin  daily, Disp: 100 each, Rfl: 0   isoniazid  (NYDRAZID ) 300 MG tablet, Take 1 tablet (300 mg total) by mouth daily., Disp: 30 tablet, Rfl: 8   pyridOXINE (VITAMIN B6) 50 MG tablet, Take 1 tablet (50 mg total) by mouth daily., Disp: 30 tablet, Rfl: 8   rosuvastatin  (  CRESTOR ) 20 MG tablet, Take 1 tablet (20 mg total) by mouth daily., Disp: 90 tablet, Rfl: 1   traZODone  (DESYREL ) 50 MG tablet, Take 0.5-1 tablets (25-50 mg total) by mouth at bedtime as needed for sleep., Disp: 30 tablet, Rfl: 3  "

## 2024-03-04 ENCOUNTER — Encounter: Payer: Self-pay | Admitting: Physician Assistant

## 2024-03-04 ENCOUNTER — Ambulatory Visit: Attending: Physician Assistant | Admitting: Physician Assistant

## 2024-03-04 VITALS — BP 122/70 | HR 81 | Ht 68.0 in | Wt 226.1 lb

## 2024-03-04 DIAGNOSIS — R072 Precordial pain: Secondary | ICD-10-CM | POA: Diagnosis not present

## 2024-03-04 DIAGNOSIS — E1165 Type 2 diabetes mellitus with hyperglycemia: Secondary | ICD-10-CM | POA: Diagnosis not present

## 2024-03-04 NOTE — Assessment & Plan Note (Signed)
 I have asked him to make sure he refills his Farxiga  or reach out to primary care to get a refill.

## 2024-03-04 NOTE — Patient Instructions (Addendum)
"   Medication Instructions:  Your physician recommends that you continue on your current medications as directed. Please refer to the Current Medication list given to you today.  *If you need a refill on your cardiac medications before your next appointment, please call your pharmacy*  Lab Work: None ordered  If you have labs (blood work) drawn today and your tests are completely normal, you will receive your results only by: MyChart Message (if you have MyChart) OR A paper copy in the mail If you have any lab test that is abnormal or we need to change your treatment, we will call you to review the results.  Testing/Procedures: None ordered   Follow-Up: At [facility], you and your health needs are our priority.  As part of our continuing mission to provide you with exceptional heart care, our providers are all part of one team.  This team includes your primary Cardiologist (physician) and Advanced Practice Providers or APPs (Physician Assistants and Nurse Practitioners) who all work together to provide you with the care you need, when you need it.  Your next appointment:   As needed   Provider:   Glendia Ferrier, PA-C          We recommend signing up for the patient portal called MyChart.  Sign up information is provided on this After Visit Summary.  MyChart is used to connect with patients for Virtual Visits (Telemedicine).  Patients are able to view lab/test results, encounter notes, upcoming appointments, etc.  Non-urgent messages can be sent to your provider as well.   To learn more about what you can do with MyChart, go to [email or web address].   Other Instructions          "

## 2024-03-05 ENCOUNTER — Other Ambulatory Visit: Payer: Self-pay

## 2024-03-05 ENCOUNTER — Ambulatory Visit (INDEPENDENT_AMBULATORY_CARE_PROVIDER_SITE_OTHER): Payer: Self-pay | Admitting: Infectious Disease

## 2024-03-05 VITALS — BP 121/80 | HR 77 | Temp 97.9°F | Wt 226.0 lb

## 2024-03-05 DIAGNOSIS — Z227 Latent tuberculosis: Secondary | ICD-10-CM

## 2024-03-05 DIAGNOSIS — E1165 Type 2 diabetes mellitus with hyperglycemia: Secondary | ICD-10-CM

## 2024-03-05 DIAGNOSIS — A539 Syphilis, unspecified: Secondary | ICD-10-CM | POA: Diagnosis not present

## 2024-03-05 DIAGNOSIS — H9193 Unspecified hearing loss, bilateral: Secondary | ICD-10-CM

## 2024-03-05 DIAGNOSIS — E785 Hyperlipidemia, unspecified: Secondary | ICD-10-CM

## 2024-03-05 DIAGNOSIS — B20 Human immunodeficiency virus [HIV] disease: Secondary | ICD-10-CM | POA: Diagnosis present

## 2024-03-05 DIAGNOSIS — R85610 Atypical squamous cells of undetermined significance on cytologic smear of anus (ASC-US): Secondary | ICD-10-CM

## 2024-03-05 MED ORDER — ISONIAZID 300 MG PO TABS: 300.0000 mg | ORAL_TABLET | Freq: Every day | ORAL | 8 refills | Status: AC

## 2024-03-05 MED ORDER — DOXYCYCLINE HYCLATE 100 MG PO TABS: 100.0000 mg | ORAL_TABLET | Freq: Two times a day (BID) | ORAL | 0 refills | Status: AC

## 2024-03-05 MED ORDER — VITAMIN B-6 50 MG PO TABS: 50.0000 mg | ORAL_TABLET | Freq: Every day | ORAL | 8 refills | Status: AC

## 2024-03-05 MED ORDER — ROSUVASTATIN CALCIUM 20 MG PO TABS: 20.0000 mg | ORAL_TABLET | Freq: Every day | ORAL | 1 refills | Status: AC

## 2024-03-05 MED ORDER — BIKTARVY 50-200-25 MG PO TABS: 1.0000 | ORAL_TABLET | Freq: Every day | ORAL | 11 refills | Status: AC

## 2024-03-05 NOTE — Patient Instructions (Signed)
 I have sent meds to pharmacy in [facility]) that accepts the SPAP assistance program with Medicare they should be able to fill all of your medicines in a few days per Deanna  If problems reach out to Deanna  Worst case we can have them mailed to you

## 2024-03-17 ENCOUNTER — Telehealth: Payer: Self-pay

## 2024-03-17 DIAGNOSIS — E1165 Type 2 diabetes mellitus with hyperglycemia: Secondary | ICD-10-CM

## 2024-03-17 MED ORDER — METFORMIN HCL 500 MG PO TABS: 500.0000 mg | ORAL_TABLET | Freq: Two times a day (BID) | ORAL | 1 refills | Status: AC

## 2024-03-17 NOTE — Telephone Encounter (Signed)
 I am going to add back metformin  twice daily in place of farxiga .  I am also going to place a referral to our pharmacist to see if we can work on patient assistance for Farxiga .  Did his insurance not cover the continuous glucose meter [place]) ?  How have his sugars been?

## 2024-03-17 NOTE — Telephone Encounter (Unsigned)
 Copied from CRM [id number]. Topic: Clinical - Prescription Issue >> Mar 17, 2024  8:33 AM Mercedes MATSU wrote: Reason for CRM: Patient called in stating that the cost of the dapagliflozin  propanediol (FARXIGA ) 5 MG TABS tablet has changed and is really high priced. Patient is requesting a call back and can be reached at [phone].

## 2024-03-17 NOTE — Telephone Encounter (Signed)
 Received voicemail from patient stating his medication is not covered at [facility]. Patient has SPAP.   Hazelene Doten, BSN, RN

## 2024-03-18 NOTE — Telephone Encounter (Signed)
 Patient notified of medication change and referral to pharmacist. He reports he did not pick up the glucose monitor. Patient advised to pick  up and send us  readings for one week.

## 2024-03-20 ENCOUNTER — Telehealth: Payer: Self-pay

## 2024-03-20 NOTE — Progress Notes (Signed)
 Complex Care Management Note Care Guide Note  03/20/2024 Name: Matthew Fitzpatrick MRN: [id number] DOB: 1980/10/04   Complex Care Management Outreach Attempts: An unsuccessful telephone outreach was attempted today to offer the patient information about available complex care management services.  Follow Up Plan:  Additional outreach attempts will be made to offer the patient complex care management information and services.   Encounter Outcome:  No Answer  Dream[facility]  [facility], [facility] VBCI Assistant Direct Dial: [phone]  Fax: [phone]

## 2024-03-24 NOTE — Progress Notes (Unsigned)
 Complex Care Management Note Care Guide Note  03/24/2024 Name: Matthew Fitzpatrick MRN: [id number] DOB: 01-Oct-1980   Complex Care Management Outreach Attempts: A second unsuccessful outreach was attempted today to offer the patient with information about available complex care management services.  Follow Up Plan:  Additional outreach attempts will be made to offer the patient complex care management information and services.   Encounter Outcome:  No Answer  Dreama [facility]  [facility], [facility] VBCI Assistant Direct Dial: [phone]  Fax: [phone]

## 2024-03-26 ENCOUNTER — Other Ambulatory Visit: Payer: Self-pay | Admitting: Family

## 2024-03-26 NOTE — Progress Notes (Signed)
 Complex Care Management Note Care Guide Note  03/26/2024 Name: Matthew Fitzpatrick MRN: [id number] DOB: 1981/01/10   Complex Care Management Outreach Attempts: A third unsuccessful outreach was attempted today to offer the patient with information about available complex care management services.  Follow Up Plan:  No further outreach attempts will be made at this time. We have been unable to contact the patient to offer or enroll patient in complex care management services.  Encounter Outcome:  No Answer  Dreama [facility]  [facility], [facility] VBCI Assistant Direct Dial: [phone]  Fax: [phone]

## 2024-04-09 ENCOUNTER — Ambulatory Visit

## 2024-04-09 ENCOUNTER — Ambulatory Visit: Admitting: Family

## 2024-04-29 ENCOUNTER — Other Ambulatory Visit: Payer: Self-pay

## 2024-05-13 ENCOUNTER — Ambulatory Visit: Payer: Self-pay | Admitting: Infectious Disease
